# Patient Record
Sex: Male | Born: 1955 | Race: White | Hispanic: No | Marital: Married | Smoking: Former smoker
Health system: Southern US, Community
[De-identification: ages and names within clinical notes are randomized; demographics above are authoritative.]

## PROBLEM LIST (undated history)

## (undated) DIAGNOSIS — E785 Hyperlipidemia, unspecified: Secondary | ICD-10-CM

## (undated) DIAGNOSIS — G8929 Other chronic pain: Secondary | ICD-10-CM

## (undated) DIAGNOSIS — Z720 Tobacco use: Secondary | ICD-10-CM

## (undated) DIAGNOSIS — H9190 Unspecified hearing loss, unspecified ear: Secondary | ICD-10-CM

## (undated) DIAGNOSIS — R0600 Dyspnea, unspecified: Secondary | ICD-10-CM

## (undated) DIAGNOSIS — M549 Dorsalgia, unspecified: Secondary | ICD-10-CM

## (undated) DIAGNOSIS — K219 Gastro-esophageal reflux disease without esophagitis: Secondary | ICD-10-CM

## (undated) DIAGNOSIS — D472 Monoclonal gammopathy: Secondary | ICD-10-CM

## (undated) DIAGNOSIS — D649 Anemia, unspecified: Secondary | ICD-10-CM

## (undated) DIAGNOSIS — I10 Essential (primary) hypertension: Secondary | ICD-10-CM

## (undated) DIAGNOSIS — J449 Chronic obstructive pulmonary disease, unspecified: Secondary | ICD-10-CM

## (undated) DIAGNOSIS — C9000 Multiple myeloma not having achieved remission: Secondary | ICD-10-CM

## (undated) DIAGNOSIS — M1990 Unspecified osteoarthritis, unspecified site: Secondary | ICD-10-CM

## (undated) DIAGNOSIS — R809 Proteinuria, unspecified: Secondary | ICD-10-CM

## (undated) DIAGNOSIS — N189 Chronic kidney disease, unspecified: Secondary | ICD-10-CM

## (undated) HISTORY — DX: Proteinuria, unspecified: R80.9

## (undated) HISTORY — DX: Unspecified osteoarthritis, unspecified site: M19.90

## (undated) HISTORY — DX: Tobacco use: Z72.0

## (undated) HISTORY — DX: Other chronic pain: G89.29

## (undated) HISTORY — DX: Dorsalgia, unspecified: M54.9

## (undated) HISTORY — DX: Unspecified hearing loss, unspecified ear: H91.90

## (undated) HISTORY — DX: Hyperlipidemia, unspecified: E78.5

## (undated) HISTORY — DX: Chronic obstructive pulmonary disease, unspecified: J44.9

## (undated) HISTORY — DX: Essential (primary) hypertension: I10

## (undated) HISTORY — PX: TONSILLECTOMY: SUR1361

## (undated) HISTORY — DX: Monoclonal gammopathy: D47.2

## (undated) HISTORY — DX: Multiple myeloma not having achieved remission: C90.00

---

## 2009-01-06 DIAGNOSIS — C9000 Multiple myeloma not having achieved remission: Secondary | ICD-10-CM

## 2009-01-06 HISTORY — DX: Multiple myeloma not having achieved remission: C90.00

## 2010-07-03 DIAGNOSIS — L739 Follicular disorder, unspecified: Secondary | ICD-10-CM | POA: Insufficient documentation

## 2010-07-03 DIAGNOSIS — L309 Dermatitis, unspecified: Secondary | ICD-10-CM | POA: Insufficient documentation

## 2010-07-13 DIAGNOSIS — R809 Proteinuria, unspecified: Secondary | ICD-10-CM | POA: Insufficient documentation

## 2011-07-28 ENCOUNTER — Ambulatory Visit: Payer: Self-pay

## 2011-11-12 DIAGNOSIS — J329 Chronic sinusitis, unspecified: Secondary | ICD-10-CM | POA: Insufficient documentation

## 2011-11-12 DIAGNOSIS — J310 Chronic rhinitis: Secondary | ICD-10-CM | POA: Insufficient documentation

## 2011-11-25 ENCOUNTER — Encounter: Payer: Self-pay | Admitting: Internal Medicine

## 2011-12-07 ENCOUNTER — Encounter: Payer: Self-pay | Admitting: Internal Medicine

## 2012-01-28 ENCOUNTER — Encounter: Payer: Self-pay | Admitting: Internal Medicine

## 2012-02-07 ENCOUNTER — Encounter: Payer: Self-pay | Admitting: Internal Medicine

## 2012-05-24 DIAGNOSIS — H9110 Presbycusis, unspecified ear: Secondary | ICD-10-CM | POA: Insufficient documentation

## 2012-05-24 DIAGNOSIS — E785 Hyperlipidemia, unspecified: Secondary | ICD-10-CM | POA: Insufficient documentation

## 2012-05-24 DIAGNOSIS — C9000 Multiple myeloma not having achieved remission: Secondary | ICD-10-CM | POA: Insufficient documentation

## 2012-05-24 DIAGNOSIS — D1730 Benign lipomatous neoplasm of skin and subcutaneous tissue of unspecified sites: Secondary | ICD-10-CM | POA: Insufficient documentation

## 2012-05-24 DIAGNOSIS — E875 Hyperkalemia: Secondary | ICD-10-CM | POA: Insufficient documentation

## 2012-05-26 DIAGNOSIS — R229 Localized swelling, mass and lump, unspecified: Secondary | ICD-10-CM | POA: Insufficient documentation

## 2012-07-01 DIAGNOSIS — C9000 Multiple myeloma not having achieved remission: Secondary | ICD-10-CM | POA: Insufficient documentation

## 2012-12-17 DIAGNOSIS — N051 Unspecified nephritic syndrome with focal and segmental glomerular lesions: Secondary | ICD-10-CM | POA: Insufficient documentation

## 2013-08-18 IMAGING — CR DG LUMBAR SPINE 2-3V
1 series · 3 of 3 positions shown · non-contrast
Comparison: none

REASON FOR EXAM: back problems, COPD, multiple myeloma, fax report to [HOSPITAL]
QB [id number]
COMMENTS:

PROCEDURE:     DXR - DXR LUMBAR SPINE AP AND LATERAL  - July 28, 2011 [DATE]
RESULT:     Five non-rib bearing lumbar vertebral bodies are appreciated.
There is no evidence of fracture, dislocation or malalignment. Degenerative
disease changes are appreciated at the L5-S1 level.

[Series 1: ap · 0.17mm/px · 3 of 3 slices shown]
[im 1/3]
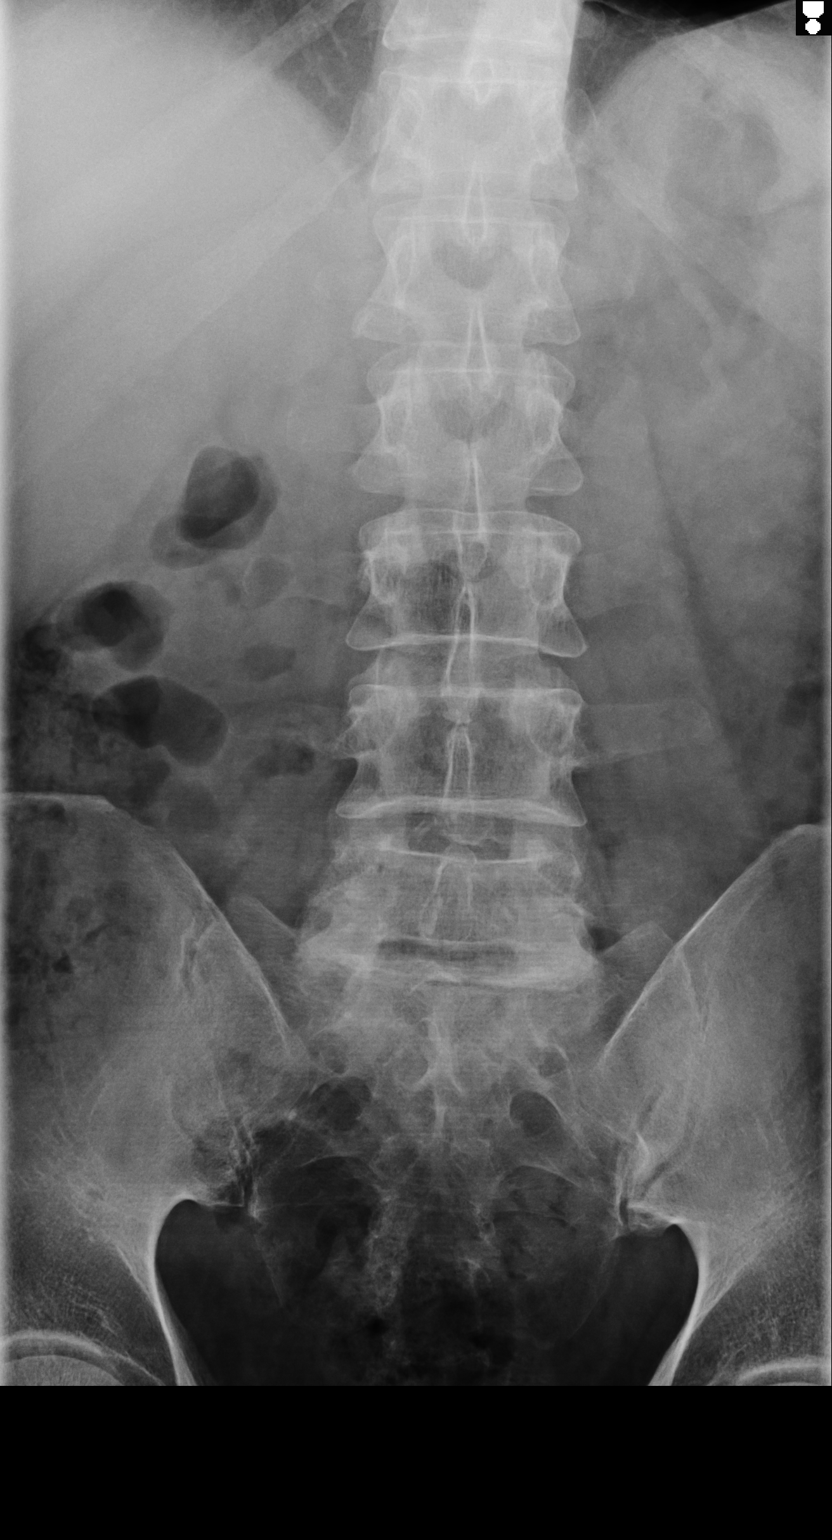
[im 2/3]
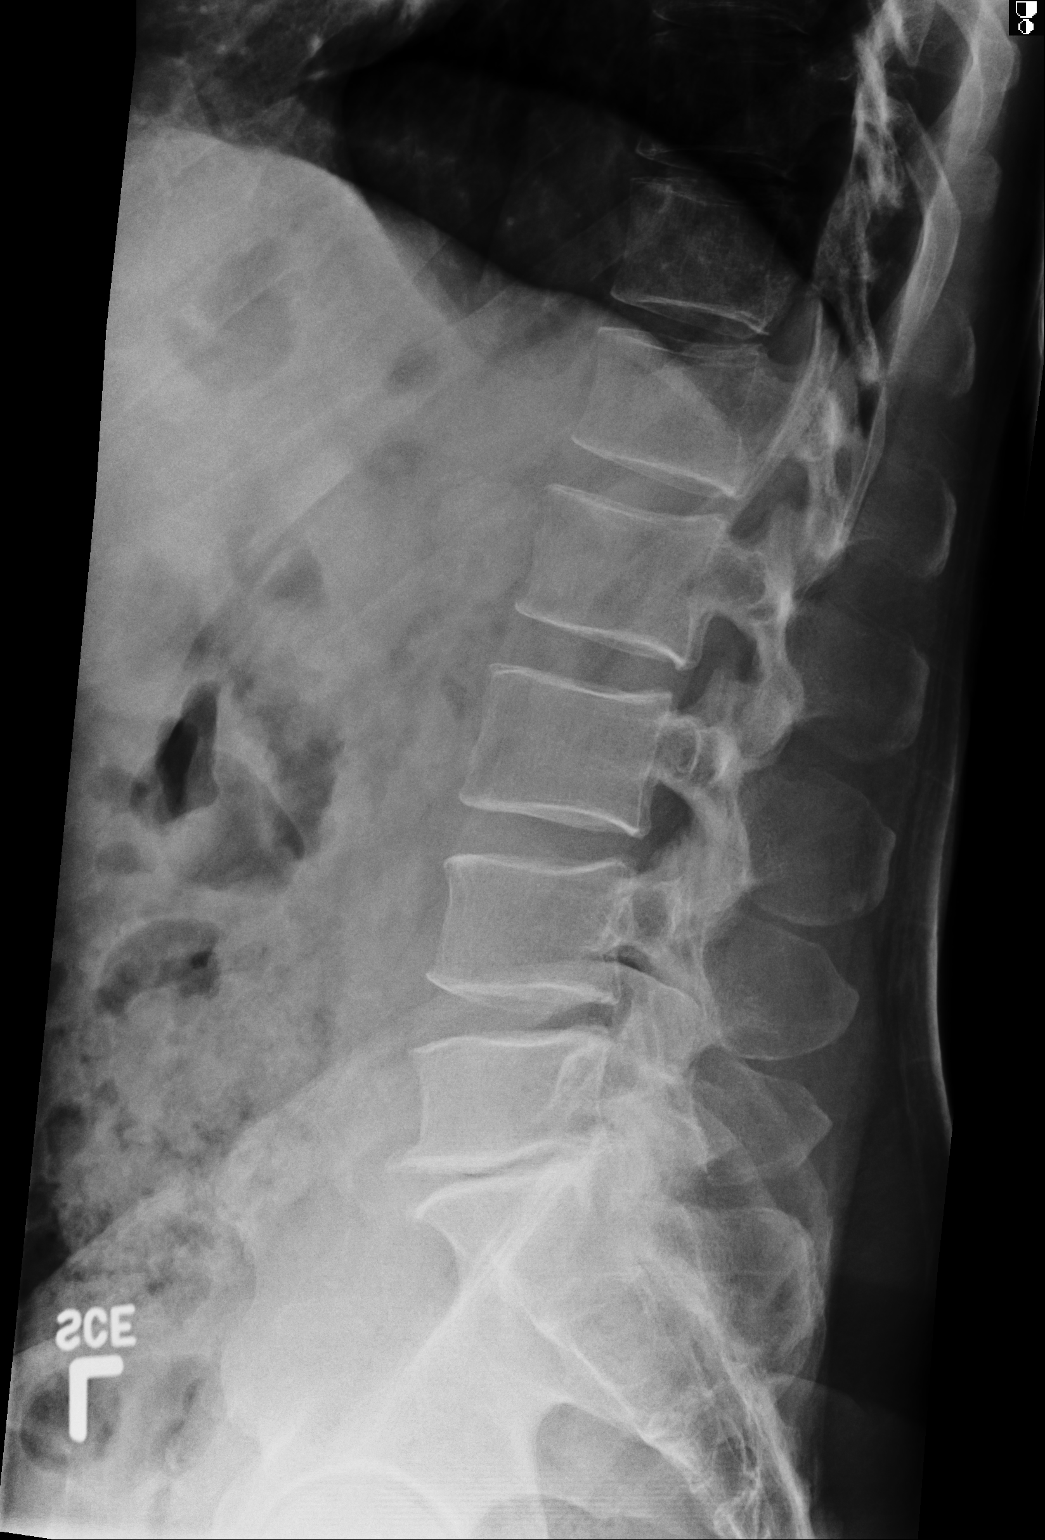
[im 3/3]
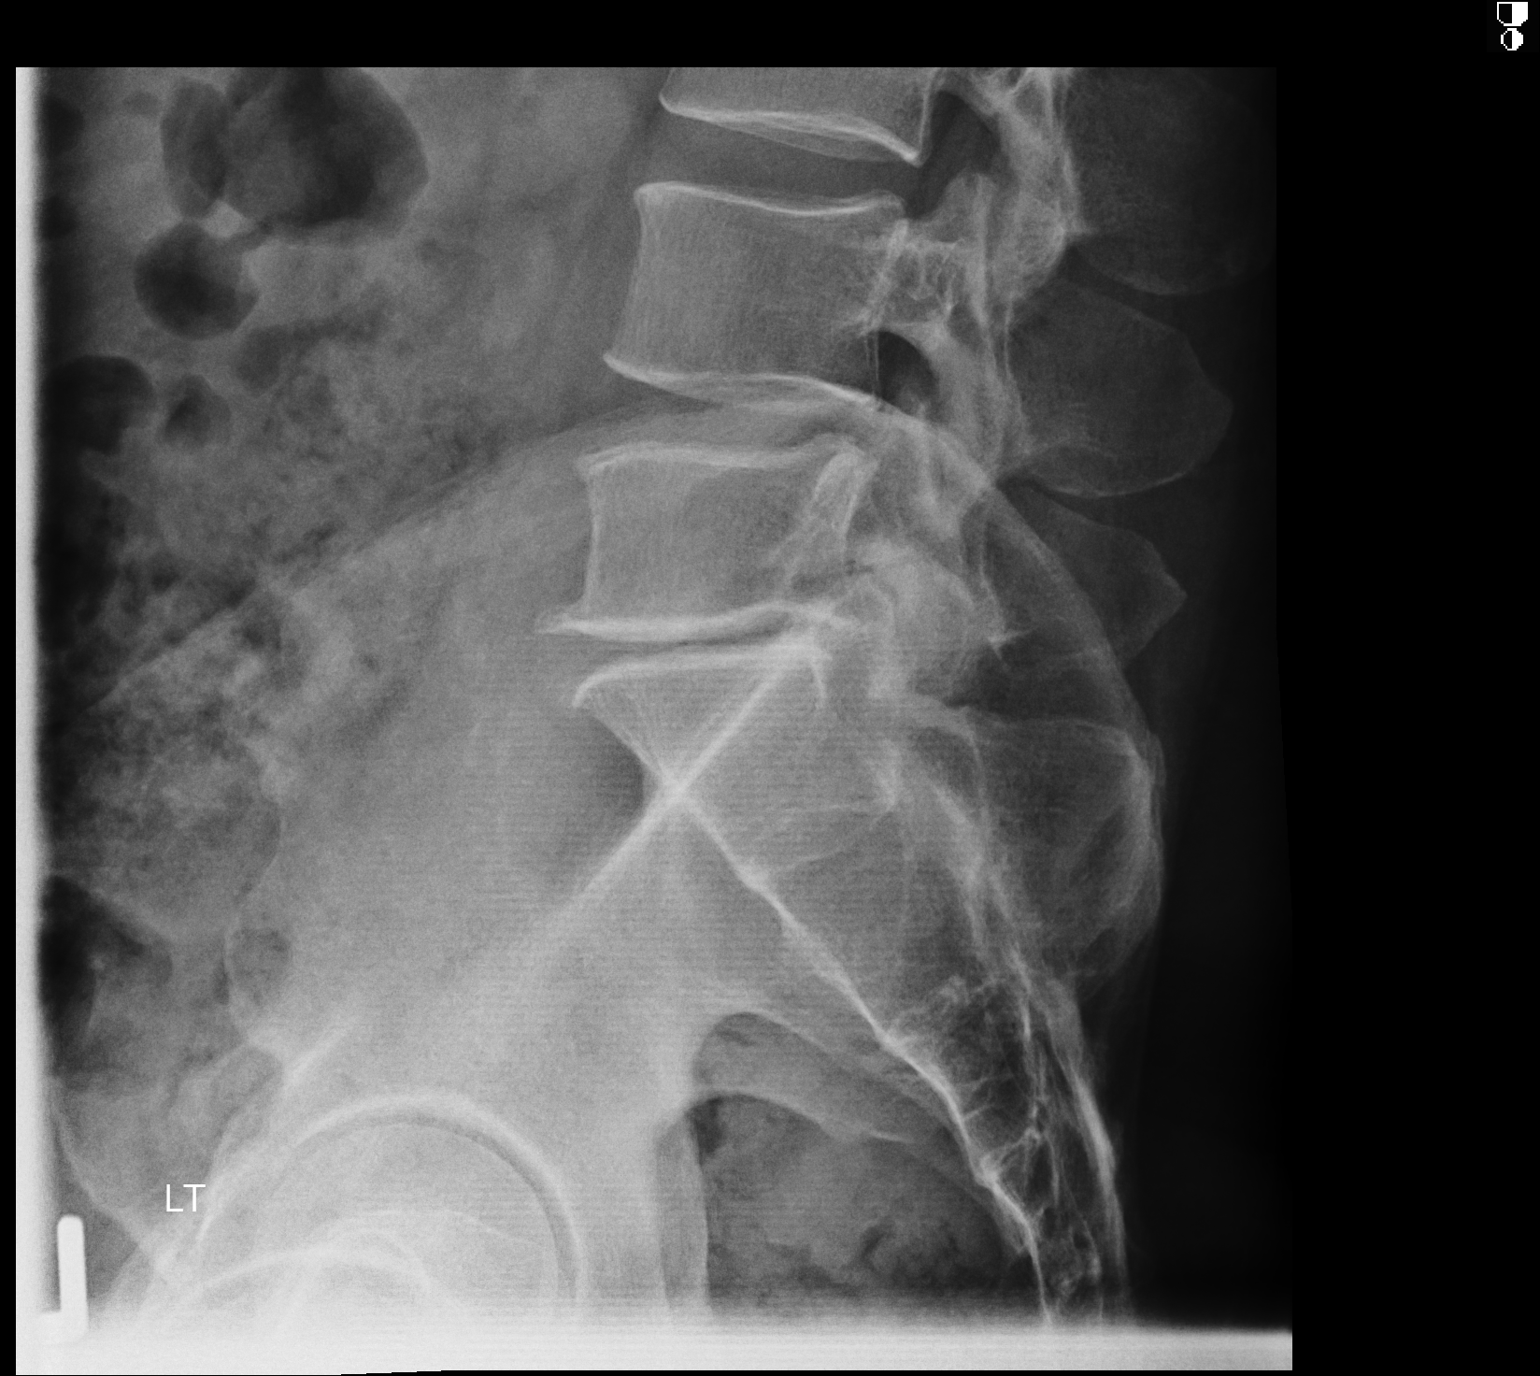

[3 of 3 positions shown; findings below may reference images not displayed]

IMPRESSION: 1. No acute osseous abnormalities.
2. If there is persistent clinical concern or persistent complaints of pain,
further evaluation with MRI is recommended.

## 2013-11-12 ENCOUNTER — Emergency Department: Payer: Self-pay | Admitting: Emergency Medicine

## 2014-01-02 DIAGNOSIS — Z720 Tobacco use: Secondary | ICD-10-CM | POA: Insufficient documentation

## 2014-01-02 DIAGNOSIS — N185 Chronic kidney disease, stage 5: Secondary | ICD-10-CM | POA: Insufficient documentation

## 2014-01-02 DIAGNOSIS — I10 Essential (primary) hypertension: Secondary | ICD-10-CM | POA: Insufficient documentation

## 2014-01-02 DIAGNOSIS — N182 Chronic kidney disease, stage 2 (mild): Secondary | ICD-10-CM

## 2014-01-02 DIAGNOSIS — R079 Chest pain, unspecified: Secondary | ICD-10-CM | POA: Insufficient documentation

## 2014-01-02 DIAGNOSIS — E782 Mixed hyperlipidemia: Secondary | ICD-10-CM | POA: Insufficient documentation

## 2014-01-02 DIAGNOSIS — R0681 Apnea, not elsewhere classified: Secondary | ICD-10-CM | POA: Insufficient documentation

## 2014-06-19 ENCOUNTER — Ambulatory Visit (INDEPENDENT_AMBULATORY_CARE_PROVIDER_SITE_OTHER): Payer: Medicaid Other | Admitting: Urgent Care

## 2014-06-19 ENCOUNTER — Encounter (INDEPENDENT_AMBULATORY_CARE_PROVIDER_SITE_OTHER): Payer: Self-pay

## 2014-06-19 ENCOUNTER — Encounter: Payer: Self-pay | Admitting: Urgent Care

## 2014-06-19 VITALS — BP 150/88 | HR 88 | Temp 97.9°F | Ht 72.0 in | Wt 235.2 lb

## 2014-06-19 DIAGNOSIS — R195 Other fecal abnormalities: Secondary | ICD-10-CM | POA: Diagnosis not present

## 2014-06-19 MED ORDER — NA SULFATE-K SULFATE-MG SULF 17.5-3.13-1.6 GM/177ML PO SOLN: 1.0000 | ORAL | Status: DC

## 2014-06-19 NOTE — Assessment & Plan Note (Signed)
.  Nathaniel [name] is a pleasant 59 y.o. male with positive iFOBT. Colonoscopy with Dr Allen Norris.  Differentials include colorectal carcinoma or polyp, inflammatory bowel disease versus benign anorectal source.  I have discussed risks & benefits which include, but are not limited to, bleeding, infection, perforation & drug reaction. The patient agrees with this plan & written consent will be obtained.

## 2014-06-19 NOTE — Progress Notes (Signed)
Gastroenterology Consultation  Referring Provider:     [facility] [place] Physician:  Ellamae Sia, MD Primary Gastroenterologist:  Dr. Allen Norris     Reason for Consultation:    iFOBT positive        HPI:   Nathaniel [name] is a 59 y.o. y/o male referred for consultation & management of iFOBT by Ellamae Sia, MD.  Pt was found to be iFOBT positive.  Never had a colonoscopy.  Denies heartburn, indigestion, nausea, vomiting, dysphagia, odynophagia or anorexia.  Denies constipation, diarrhea, rectal bleeding, melena or weight loss.  Denies NSAIDS except IBU less than once per month for back pain.   Past Medical History  Diagnosis Date  . COPD (chronic obstructive pulmonary disease)   . Hypertension   . Chronic back pain     Dr. Quay Burow manages (per pt)  . MGUS (monoclonal gammopathy of unknown significance)   . Proteinuria   . Hearing loss   . Hyperlipidemia   . Tobacco abuse     Past Surgical History  Procedure Laterality Date  . Tonsillectomy      as child    Prior to Admission medications   Medication Sig Start Date End Date Taking? Authorizing Provider  albuterol (PROVENTIL HFA;VENTOLIN HFA) 108 (90 BASE) MCG/ACT inhaler Inhale 2 puffs into the lungs every 6 (six) hours as needed for wheezing or shortness of breath.   Yes Historical Provider, MD  COMBIVENT RESPIMAT 20-100 MCG/ACT AERS respimat Inhale 1 puff into the lungs daily. 06/15/14  Yes Historical Provider, MD  enalapril (VASOTEC) 10 MG tablet Take 1 tablet by mouth at bedtime. 05/20/14  Yes Historical Provider, MD  oxyCODONE-acetaminophen (PERCOCET/ROXICET) 5-325 MG per tablet Take 1 tablet by mouth every 6 (six) hours as needed. 05/24/14  Yes Historical Provider, MD  sildenafil (VIAGRA) 100 MG tablet Take 1 tablet by mouth as needed.   Yes Historical Provider, MD    Family History  Problem Relation Age of Onset  . Heart disease Brother 108    MI  . Colon cancer Neg Hx   . Liver disease Neg Hx        History   Social History Narrative   Disabled, 2 sons-healthy   History  Substance Use Topics  . Smoking status: Current Some Day Smoker -- 0.50 packs/day  . Smokeless tobacco: Never Used  . Alcohol Use: No    Allergies as of 06/19/2014  . (No Known Allergies)    Review of Systems:    All systems reviewed and negative except where noted in HPI.   Physical Exam:  BP 150/88 mmHg  Pulse 88  Temp(Src) 97.9 F (36.6 C) (Oral)  Ht 6' (1.829 m)  Wt 235 lb 3.2 oz (106.686 kg)  BMI 31.89 kg/m2 No LMP for male patient. General:   Alert,  Well-developed, obese, pleasant and cooperative in NAD Head:  Normocephalic and atraumatic. Eyes:  Sclera clear, no icterus.   Conjunctiva pink. Ears:  Normal auditory acuity. Nose:  No deformity, discharge, or lesions. Mouth:  No deformity or lesions,oropharynx pink & moist. Neck:  Supple; no masses or thyromegaly. Lungs:  Respirations even and unlabored.  Clear throughout to auscultation.   No wheezes, crackles, or rhonchi. No acute distress. Heart:  Regular rate and rhythm; no murmurs, clicks, rubs, or gallops. Abdomen:  Obese.  Normal bowel sounds.  No bruits.  Soft, non-tender and non-distended without masses, hepatosplenomegaly or hernias noted.  No guarding or rebound tenderness.  Negative  Carnett sign.   Rectal:  Deferred.  Msk:  Symmetrical without gross deformities.  Good, equal movement & strength bilaterally. Pulses:  Normal pulses noted. Extremities:  No clubbing or edema.  No cyanosis. Neurologic:  Alert and oriented x3;  grossly normal neurologically. Skin:  Intact without significant lesions or rashes.  No jaundice. Lymph Nodes:  No significant cervical adenopathy. Psych:  Alert and cooperative. Normal mood and affect.

## 2014-06-19 NOTE — Patient Instructions (Signed)
Colonoscopy with Dr Allen Norris  One of your biggest health concerns is your smoking which increases your risk for most cancers and serious cardiovascular diseases such as strokes & heart attacks.  You should try your best to stop.  If you need assistance, please contact your PCP or Smoking Cessation Class at [facility] [phone]) or [facility] (1-800-QUIT-NOW).

## 2014-06-28 DIAGNOSIS — J449 Chronic obstructive pulmonary disease, unspecified: Secondary | ICD-10-CM | POA: Insufficient documentation

## 2014-06-30 ENCOUNTER — Telehealth: Payer: Self-pay

## 2014-06-30 NOTE — Telephone Encounter (Signed)
Spoke with patient's wife at this time and explained [facility] instructions for pre-registration and arrival time.

## 2014-07-04 ENCOUNTER — Encounter: Payer: Self-pay | Admitting: *Deleted

## 2014-07-04 ENCOUNTER — Ambulatory Visit: Payer: Medicaid Other | Admitting: Anesthesiology

## 2014-07-04 ENCOUNTER — Ambulatory Visit
Admission: RE | Admit: 2014-07-04 | Discharge: 2014-07-04 | Disposition: A | Discharge status: Home / Self Care | Payer: Medicaid Other | Source: Ambulatory Visit | Attending: Gastroenterology | Admitting: Gastroenterology

## 2014-07-04 ENCOUNTER — Encounter
Admission: RE | Disposition: A | Discharge status: Home / Self Care | Payer: Self-pay | Source: Ambulatory Visit | Attending: Gastroenterology

## 2014-07-04 DIAGNOSIS — Z1211 Encounter for screening for malignant neoplasm of colon: Secondary | ICD-10-CM | POA: Insufficient documentation

## 2014-07-04 DIAGNOSIS — D125 Benign neoplasm of sigmoid colon: Secondary | ICD-10-CM

## 2014-07-04 DIAGNOSIS — J449 Chronic obstructive pulmonary disease, unspecified: Secondary | ICD-10-CM | POA: Insufficient documentation

## 2014-07-04 DIAGNOSIS — M549 Dorsalgia, unspecified: Secondary | ICD-10-CM | POA: Diagnosis not present

## 2014-07-04 DIAGNOSIS — Z7951 Long term (current) use of inhaled steroids: Secondary | ICD-10-CM | POA: Insufficient documentation

## 2014-07-04 DIAGNOSIS — E785 Hyperlipidemia, unspecified: Secondary | ICD-10-CM | POA: Insufficient documentation

## 2014-07-04 DIAGNOSIS — D472 Monoclonal gammopathy: Secondary | ICD-10-CM | POA: Diagnosis not present

## 2014-07-04 DIAGNOSIS — G8929 Other chronic pain: Secondary | ICD-10-CM | POA: Diagnosis not present

## 2014-07-04 DIAGNOSIS — I10 Essential (primary) hypertension: Secondary | ICD-10-CM | POA: Diagnosis not present

## 2014-07-04 DIAGNOSIS — K621 Rectal polyp: Secondary | ICD-10-CM

## 2014-07-04 DIAGNOSIS — F17210 Nicotine dependence, cigarettes, uncomplicated: Secondary | ICD-10-CM | POA: Insufficient documentation

## 2014-07-04 DIAGNOSIS — D123 Benign neoplasm of transverse colon: Secondary | ICD-10-CM | POA: Diagnosis not present

## 2014-07-04 DIAGNOSIS — Z79899 Other long term (current) drug therapy: Secondary | ICD-10-CM | POA: Insufficient documentation

## 2014-07-04 HISTORY — PX: COLONOSCOPY WITH PROPOFOL: SHX5780

## 2014-07-04 SURGERY — COLONOSCOPY WITH PROPOFOL
Anesthesia: General

## 2014-07-04 MED ORDER — FENTANYL CITRATE (PF) 100 MCG/2ML IJ SOLN
INTRAMUSCULAR | Status: DC | PRN
  Administered 2014-07-04: 50 ug via INTRAVENOUS

## 2014-07-04 MED ORDER — MIDAZOLAM HCL 2 MG/2ML IJ SOLN
INTRAMUSCULAR | Status: DC | PRN
  Administered 2014-07-04: 1 mg via INTRAVENOUS

## 2014-07-04 MED ORDER — SODIUM CHLORIDE 0.9 % IV SOLN
INTRAVENOUS | Status: DC
  Administered 2014-07-04: 1000 mL via INTRAVENOUS

## 2014-07-04 MED ORDER — PROPOFOL 10 MG/ML IV BOLUS
INTRAVENOUS | Status: DC | PRN
  Administered 2014-07-04 (×4): 40 mg via INTRAVENOUS

## 2014-07-04 MED ORDER — PROPOFOL INFUSION 10 MG/ML OPTIME
INTRAVENOUS | Status: DC | PRN
  Administered 2014-07-04: 150 ug/kg/min via INTRAVENOUS

## 2014-07-04 MED ORDER — LACTATED RINGERS IV SOLN
INTRAVENOUS | Status: DC | PRN
  Administered 2014-07-04: 11:00:00 via INTRAVENOUS

## 2014-07-04 NOTE — Anesthesia Postprocedure Evaluation (Signed)
  Anesthesia Post-op Note  Patient: Nathaniel [name]  Procedure(s) Performed: Procedure(s): COLONOSCOPY WITH PROPOFOL (N/A)  Anesthesia type:General  Patient location: PACU  Post pain: Pain level controlled  Post assessment: Post-op Vital signs reviewed, Patient's Cardiovascular Status Stable, Respiratory Function Stable, Patent Airway and No signs of Nausea or vomiting  Post vital signs: Reviewed and stable  Last Vitals:  Filed Vitals:   07/04/14 1220  BP: 128/81  Pulse: 65  Temp:   Resp: 21    Level of consciousness: awake, alert  and patient cooperative  Complications: No apparent anesthesia complications

## 2014-07-04 NOTE — Anesthesia Preprocedure Evaluation (Addendum)
Anesthesia Evaluation  Patient identified by MRN, date of birth, ID band Patient awake    Reviewed: Allergy & Precautions, NPO status , Patient's Chart, lab work & pertinent test results  Airway Mallampati: II  TM Distance: >3 FB Neck ROM: Full    Dental  (+) Chipped   Pulmonary COPD COPD inhaler, Current Smoker,  breath sounds clear to auscultation  Pulmonary exam normal       Cardiovascular Normal cardiovascular exam Pt actually has lower BP.  On vasotec for his kidneys   Neuro/Psych negative neurological ROS  negative psych ROS   GI/Hepatic negative GI ROS, Neg liver ROS,   Endo/Other  negative endocrine ROS  Renal/GU Renal InsufficiencyRenal disease  negative genitourinary   Musculoskeletal negative musculoskeletal ROS (+)   Abdominal   Peds negative pediatric ROS (+)  Hematology negative hematology ROS (+)   Anesthesia Other Findings   Reproductive/Obstetrics                           Anesthesia Physical Anesthesia Plan  ASA: III  Anesthesia Plan: General   Post-op Pain Management:    Induction: Intravenous  Airway Management Planned: Nasal Cannula  Additional Equipment:   Intra-op Plan:   Post-operative Plan:   Informed Consent: I have reviewed the patients History and Physical, chart, labs and discussed the procedure including the risks, benefits and alternatives for the proposed anesthesia with the patient or authorized representative who has indicated his/her understanding and acceptance.   Dental advisory given  Plan Discussed with: CRNA and Surgeon  Anesthesia Plan Comments:         Anesthesia Quick Evaluation

## 2014-07-04 NOTE — Transfer of Care (Signed)
Immediate Anesthesia Transfer of Care Note  Patient: Nathaniel [name]  Procedure(s) Performed: Procedure(s): COLONOSCOPY WITH PROPOFOL (N/A)  Patient Location: PACU  Anesthesia Type:General  Level of Consciousness: awake, alert  and oriented  Airway & Oxygen Therapy: Patient Spontanous Breathing and Patient connected to nasal cannula oxygen  Post-op Assessment: Report given to RN and Post -op Vital signs reviewed and stable  Post vital signs: Reviewed and stable  Last Vitals:  Filed Vitals:   07/04/14 1024  BP: 123/83  Pulse: 78  Temp: 36.1 C  Resp: 20    Complications: No apparent anesthesia complications

## 2014-07-04 NOTE — H&P (Signed)
[facility]  [address] [place], [place] [address] Phone: [phone] Fax : [phone]  Primary Care Physician:  Ellamae Sia, MD Primary Gastroenterologist:  Dr. Allen Norris  Pre-Procedure History & Physical: HPI:  Nathaniel [name] is a 59 y.o. male is here for an colonoscopy.   Past Medical History  Diagnosis Date  . COPD (chronic obstructive pulmonary disease)   . Hypertension   . Chronic back pain     Dr. Quay Burow manages (per pt)  . MGUS (monoclonal gammopathy of unknown significance)   . Proteinuria   . Hearing loss   . Hyperlipidemia   . Tobacco abuse     Past Surgical History  Procedure Laterality Date  . Tonsillectomy      as child    Prior to Admission medications   Medication Sig Start Date End Date Taking? Authorizing Provider  COMBIVENT RESPIMAT 20-100 MCG/ACT AERS respimat Inhale 1 puff into the lungs daily. 06/15/14  Yes Historical Provider, MD  pravastatin (PRAVACHOL) 10 MG tablet Take 10 mg by mouth daily.   Yes Historical Provider, MD  albuterol (PROVENTIL HFA;VENTOLIN HFA) 108 (90 BASE) MCG/ACT inhaler Inhale 2 puffs into the lungs every 6 (six) hours as needed for wheezing or shortness of breath.    Historical Provider, MD  enalapril (VASOTEC) 10 MG tablet Take 1 tablet by mouth at bedtime. 05/20/14   Historical Provider, MD  Na Sulfate-K Sulfate-Mg Sulf (SUPREP BOWEL PREP) SOLN Take 1 kit by mouth as directed. 06/19/14   Andria Meuse, NP  oxyCODONE-acetaminophen (PERCOCET/ROXICET) 5-325 MG per tablet Take 1 tablet by mouth every 6 (six) hours as needed. 05/24/14   Historical Provider, MD  sildenafil (VIAGRA) 100 MG tablet Take 1 tablet by mouth as needed.    Historical Provider, MD    Allergies as of 06/19/2014  . (No Known Allergies)    Family History  Problem Relation Age of Onset  . Heart disease Brother 46    MI  . Colon cancer Neg Hx   . Liver disease Neg Hx     History   Social History  . Marital Status: Single    Spouse Name: N/A  . Number of Children: 2  . Years of Education: N/A   Occupational History  . Not on file.   Social History Main Topics  . Smoking status: Current Some Day Smoker -- 0.50 packs/day  . Smokeless tobacco: Never Used  . Alcohol Use: No  . Drug Use: No  . Sexual Activity: Not on file   Other Topics Concern  . Not on file   Social History Narrative   Disabled, 2 sons-healthy    Review of Systems: See HPI, otherwise negative ROS  Physical Exam: BP 123/83 mmHg  Pulse 78  Temp(Src) 97 F (36.1 C) (Tympanic)  Resp 20  Ht 6' (1.829 m)  Wt 230 lb (104.327 kg)  BMI 31.19 kg/m2  SpO2 99% General:   Alert,  pleasant and cooperative in NAD Head:  Normocephalic and atraumatic. Neck:  Supple; no masses or thyromegaly. Lungs:  Clear throughout to auscultation.    Heart:  Regular rate and rhythm. Abdomen:  Soft, nontender and nondistended. Normal bowel sounds, without guarding, and without rebound.   Neurologic:  Alert and  oriented x4;  grossly normal neurologically.  Impression/Plan: [name] is here for an colonoscopy to be performed for heme positive stools  Risks, benefits, limitations, and alternatives regarding  colonoscopy have been reviewed with the patient.  Questions  have been answered.  All parties agreeable.   Ollen Bowl, MD  07/04/2014, 11:07 AM

## 2014-07-04 NOTE — Op Note (Signed)
[facility] Gastroenterology Patient Name: Nathaniel [name] Procedure Date: 07/04/2014 11:15 AM MRN: [id number] Account #: [id number] Date of Birth: 1955/03/14 Admit Type: Outpatient Age: 59 Room: [facility] ENDO ROOM 4 Gender: Male Note Status: Finalized Procedure:         Colonoscopy Indications:       Screening for colorectal malignant neoplasm Providers:         Lucilla Lame, MD Referring MD:      Ellin Goodie, MD (Referring MD) Medicines:         Propofol per Anesthesia Complications:     No immediate complications. Procedure:         Pre-Anesthesia Assessment:                    - Prior to the procedure, a History and Physical was                     performed, and patient medications and allergies were                     reviewed. The patient's tolerance of previous anesthesia                     was also reviewed. The risks and benefits of the procedure                     and the sedation options and risks were discussed with the                     patient. All questions were answered, and informed consent                     was obtained. Prior Anticoagulants: The patient has taken                     no previous anticoagulant or antiplatelet agents. ASA                     Grade Assessment: II - A patient with mild systemic                     disease. After reviewing the risks and benefits, the                     patient was deemed in satisfactory condition to undergo                     the procedure.                    After obtaining informed consent, the colonoscope was                     passed under direct vision. Throughout the procedure, the                     patient's blood pressure, pulse, and oxygen saturations                     were monitored continuously. The Colonoscope was                     introduced through the anus and advanced to the the cecum,  identified by appendiceal orifice and ileocecal valve. The          colonoscopy was performed without difficulty. The patient                     tolerated the procedure well. The quality of the bowel                     preparation was excellent. Findings:      The perianal and digital rectal examinations were normal.      Two sessile polyps were found in the transverse colon. The polyps were 2       to 4 mm in size. These polyps were removed with a cold biopsy forceps.       Resection and retrieval were complete.      A 8 mm polyp was found in the sigmoid colon. The polyp was sessile. The       polyp was removed with a cold snare. Resection and retrieval were       complete.      A 4 mm polyp was found in the rectum. The polyp was sessile. The polyp       was removed with a cold biopsy forceps. Resection and retrieval were       complete. Impression:        - Two 2 to 4 mm polyps in the transverse colon. Resected                     and retrieved.                    - One 8 mm polyp in the sigmoid colon. Resected and                     retrieved.                    - One 4 mm polyp in the rectum. Resected and retrieved. Recommendation:    - Await pathology results.                    - Repeat colonoscopy in 5 years if polyp adenoma and 10                     years if hyperplastic Procedure Code(s): --- Professional ---                    [phone], Colonoscopy, flexible; with removal of tumor(s),                     polyp(s), or other lesion(s) by snare technique                    45380, 65, Colonoscopy, flexible; with biopsy, single or                     multiple Diagnosis Code(s): --- Professional ---                    Z12.11, Encounter for screening for malignant neoplasm of                     colon                    D12.3, Benign neoplasm of transverse colon  D12.5, Benign neoplasm of sigmoid colon                    K62.1, Rectal polyp CPT copyright 2014 [employer]. All rights reserved. The codes  documented in this report are preliminary and upon coder review may  be revised to meet current compliance requirements. Lucilla Lame, MD 07/04/2014 11:34:54 AM This report has been signed electronically. Number of Addenda: 0 Note Initiated On: 07/04/2014 11:15 AM Scope Withdrawal Time: 0 hours 10 minutes 29 seconds  Total Procedure Duration: 0 hours 14 minutes 45 seconds       [facility]

## 2014-07-05 ENCOUNTER — Encounter: Payer: Self-pay | Admitting: Gastroenterology

## 2014-07-05 LAB — SURGICAL PATHOLOGY

## 2014-07-11 ENCOUNTER — Encounter: Payer: Self-pay | Admitting: Gastroenterology

## 2014-10-17 ENCOUNTER — Encounter: Payer: Self-pay | Admitting: Respiratory Therapy

## 2014-10-17 ENCOUNTER — Encounter: Payer: Medicaid Other | Attending: Nurse Practitioner | Admitting: Respiratory Therapy

## 2014-10-17 VITALS — Ht 73.0 in | Wt 236.5 lb

## 2014-10-17 DIAGNOSIS — J449 Chronic obstructive pulmonary disease, unspecified: Secondary | ICD-10-CM | POA: Diagnosis not present

## 2014-10-19 NOTE — Progress Notes (Signed)
Cardiac Individual Treatment Plan  Patient Details  Name: Nathaniel [name] MRN: [id number] Date of Birth: 07-16-1955 Referring Provider:  Sharlyne Cai, NP  Initial Encounter Date: Date: 10/17/14  Visit Diagnosis: COPD, moderate ([place])  Patient's Home Medications on Admission:  Current outpatient prescriptions:  .  albuterol (PROVENTIL HFA;VENTOLIN HFA) 108 (90 BASE) MCG/ACT inhaler, Inhale into the lungs., Disp: , Rfl:  .  aspirin EC 81 MG tablet, Take 81 mg by mouth., Disp: , Rfl:  .  budesonide-formoterol (SYMBICORT) 160-4.5 MCG/ACT inhaler, Inhale into the lungs., Disp: , Rfl:  .  tiotropium (SPIRIVA HANDIHALER) 18 MCG inhalation capsule, Place into inhaler and inhale., Disp: , Rfl:  .  albuterol (PROVENTIL HFA;VENTOLIN HFA) 108 (90 BASE) MCG/ACT inhaler, Inhale 2 puffs into the lungs every 6 (six) hours as needed for wheezing or shortness of breath., Disp: , Rfl:  .  COMBIVENT RESPIMAT 20-100 MCG/ACT AERS respimat, Inhale 1 puff into the lungs daily., Disp: , Rfl: 11 .  enalapril (VASOTEC) 10 MG tablet, Take 1 tablet by mouth at bedtime., Disp: , Rfl: 11 .  Na Sulfate-K Sulfate-Mg Sulf (SUPREP BOWEL PREP) SOLN, Take 1 kit by mouth as directed., Disp: 1 Bottle, Rfl: 0 .  oxyCODONE-acetaminophen (PERCOCET/ROXICET) 5-325 MG per tablet, Take 1 tablet by mouth every 6 (six) hours as needed., Disp: , Rfl: 0 .  pravastatin (PRAVACHOL) 10 MG tablet, Take 10 mg by mouth daily., Disp: , Rfl:  .  sildenafil (VIAGRA) 100 MG tablet, Take 1 tablet by mouth as needed., Disp: , Rfl:   Past Medical History: Past Medical History  Diagnosis Date  . COPD (chronic obstructive pulmonary disease) ([place])   . Hypertension   . Chronic back pain     Dr. Quay Burow manages (per pt)  . MGUS (monoclonal gammopathy of unknown significance)   . Proteinuria   . Hearing loss   . Hyperlipidemia   . Tobacco abuse     Tobacco Use: History  Smoking status  . Current Some Day Smoker -- 2.00 packs/day for 45  years  . Types: Cigarettes  Smokeless tobacco  . Never Used    Labs: Recent Review Flowsheet Data    There is no flowsheet data to display.       Exercise Target Goals: Date: 10/17/14  Exercise Program Goal: Individual exercise prescription set with THRR, safety & activity barriers. Participant demonstrates ability to understand and report RPE using BORG scale, to self-measure pulse accurately, and to acknowledge the importance of the exercise prescription.  Exercise Prescription Goal: Starting with aerobic activity 30 plus minutes a day, 3 days per week for initial exercise prescription. Provide home exercise prescription and guidelines that participant acknowledges understanding prior to discharge.  Activity Barriers & Risk Stratification:     Activity Barriers & Risk Stratification - 10/17/14 1130    Activity Barriers & Risk Stratification   Activity Barriers Back Problems;Shortness of Breath;Deconditioning;Muscular Weakness   Risk Stratification Moderate      6 Minute Walk:     6 Minute Walk      10/19/14 1007       6 Minute Walk   Phase Initial     Distance 1340 feet     Walk Time 6 minutes     Resting HR 81 bpm     Resting BP 122/64 mmHg     Max Ex. HR 91 bpm     Max Ex. BP 142/62 mmHg     RPE 9     Perceived Dyspnea  2     Symptoms No        Initial Exercise Prescription:     Initial Exercise Prescription - 10/19/14 1000    Date of Initial Exercise Prescription   Date 10/17/14   Treadmill   MPH 2.5   Grade 0   Minutes 10   Recumbant Bike   Level 2   RPM 40   Watts 20   Minutes 10   NuStep   Level 3   Watts 50   Minutes 10   Arm Ergometer   Level 1   Watts 10   Minutes 10   Recumbant Elliptical   Level 1   RPM 40   Watts 20   Minutes 10   Elliptical   Level 1   Speed 3   Minutes 1   REL-XR   Level 3   Watts 50   Minutes 10   Prescription Details   Frequency (times per week) 3   Duration Progress to 30 minutes of  continuous aerobic without signs/symptoms of physical distress   Intensity   THRR REST +  30   Ratings of Perceived Exertion 11-15   Perceived Dyspnea 2-4   Progression Continue progressive overload as per policy without signs/symptoms or physical distress.   Resistance Training   Training Prescription Yes   Weight 2   Reps 10-15      Exercise Prescription Changes:   Discharge Exercise Prescription (Final Exercise Prescription Changes):   Nutrition:  Target Goals: Understanding of nutrition guidelines, daily intake of sodium <154m, cholesterol <2012m calories 30% from fat and 7% or less from saturated fats, daily to have 5 or more servings of fruits and vegetables.  Biometrics:     Pre Biometrics - 10/19/14 1012    Pre Biometrics   Height _0  (1.854 m)   Weight 236 lb 8 oz (107.276 kg)   Waist Circumference 43.5 inches   Hip Circumference 44 inches   Waist to Hip Ratio 0.99 %   BMI (Calculated) 31.3       Nutrition Therapy Plan and Nutrition Goals:     Nutrition Therapy & Goals - 10/17/14 1130    Nutrition Therapy   Diet Mr SmMedfordould like to meet with the dietitian. He eats out a lot and likes to eat late at night. He loves milk and has cut back on the amount he drinks at night.      Nutrition Discharge: Rate Your Plate Scores:   Nutrition Goals Re-Evaluation:   Psychosocial: Target Goals: Acknowledge presence or absence of depression, maximize coping skills, provide positive support system. Participant is able to verbalize types and ability to use techniques and skills needed for reducing stress and depression.  Initial Review & Psychosocial Screening:     Initial Psych Review & Screening - 10/17/14 1130    Family Dynamics   Good Support System? Yes   Comments Mr SmLedermanas good support from his 3 children and wife. He is managing his COPD well with pacing through activities. 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   Barriers   Psychosocial barriers to participate in program There are no identifiable barriers or psychosocial needs.;The patient should benefit from training in stress management and relaxation.   Screening Interventions   Interventions Encouraged to exercise      Quality of Life Scores:     Quality of Life - 10/17/14 1130  Quality of Life Scores   Health/Function Pre 17.23 %   Socioeconomic Pre 17 %   Psych/Spiritual Pre 22.55 %   Family Pre 22.8 %   GLOBAL Pre 18.98 %      PHQ-9:     Recent Review Flowsheet Data    Depression screen PHQ 2/9 10/17/2014   Decreased Interest 1   Down, Depressed, Hopeless 0   PHQ - 2 Score 1      Psychosocial Evaluation and Intervention:   Psychosocial Re-Evaluation:   Vocational Rehabilitation: Provide vocational rehab assistance to qualifying candidates.   Vocational Rehab Evaluation & Intervention:   Education: Education Goals: Education classes will be provided on a weekly basis, covering required topics. Participant will state understanding/return demonstration of topics presented.  Learning Barriers/Preferences:     Learning Barriers/Preferences - 10/17/14 1130    Learning Barriers/Preferences   Learning Barriers None   Learning Preferences Group Instruction;Individual Instruction;Pictoral;Skilled Demonstration;Verbal Instruction;Video;Written Material      Education Topics: General Nutrition Guidelines/Fats and Fiber: -Group instruction provided by verbal, written material, models and posters to present the general guidelines for heart healthy nutrition. Gives an explanation and review of dietary fats and fiber.   Controlling Sodium/Reading Food Labels: -Group verbal and written material supporting the discussion of sodium use in heart healthy nutrition. Review and explanation with models, verbal and written materials for utilization of the food label.   Exercise Physiology &  Risk Factors: - Group verbal and written instruction with models to review the exercise physiology of the cardiovascular system and associated critical values. Details cardiovascular disease risk factors and the goals associated with each risk factor.   Aerobic Exercise & Resistance Training: - Gives group verbal and written discussion on the health impact of inactivity. On the components of aerobic and resistive training programs and the benefits of this training and how to safely progress through these programs.   Flexibility, Balance, General Exercise Guidelines: - Provides group verbal and written instruction on the benefits of flexibility and balance training programs. Provides general exercise guidelines with specific guidelines to those with heart or lung disease. Demonstration and skill practice provided.   Stress Management: - Provides group verbal and written instruction about the health risks of elevated stress, cause of high stress, and healthy ways to reduce stress.   Depression: - Provides group verbal and written instruction on the correlation between heart/lung disease and depressed mood, treatment options, and the stigmas associated with seeking treatment.   Anatomy & Physiology of the Heart: - Group verbal and written instruction and models provide basic cardiac anatomy and physiology, with the coronary electrical and arterial systems. Review of: AMI, Angina, Valve disease, Heart Failure, Cardiac Arrhythmia, Pacemakers, and the ICD.   Cardiac Procedures: - Group verbal and written instruction and models to describe the testing methods done to diagnose heart disease. Reviews the outcomes of the test results. Describes the treatment choices: Medical Management, Angioplasty, or Coronary Bypass Surgery.   Cardiac Medications: - Group verbal and written instruction to review commonly prescribed medications for heart disease. Reviews the medication, class of the drug, and  side effects. Includes the steps to properly store meds and maintain the prescription regimen.   Go Sex-Intimacy & Heart Disease, Get SMART - Goal Setting: - Group verbal and written instruction through game format to discuss heart disease and the return to sexual intimacy. Provides group verbal and written material to discuss and apply goal setting through the application of the S.M.A.R.T. Method.   Other Matters  of the Heart: - Provides group verbal, written materials and models to describe Heart Failure, Angina, Valve Disease, and Diabetes in the realm of heart disease. Includes description of the disease process and treatment options available to the cardiac patient.   Exercise & Equipment Safety: - Individual verbal instruction and demonstration of equipment use and safety with use of the equipment.   Infection Prevention: - Provides verbal and written material to individual with discussion of infection control including proper hand washing and proper equipment cleaning during exercise session.   Falls Prevention: - Provides verbal and written material to individual with discussion of falls prevention and safety.          Pulmonary Rehab from 10/17/2014 in [place]   Date  10/17/14   Educator  LB   Instruction Review Code  2- meets goals/outcomes      Diabetes: - Individual verbal and written instruction to review signs/symptoms of diabetes, desired ranges of glucose level fasting, after meals and with exercise. Advice that pre and post exercise glucose checks will be done for 3 sessions at entry of program.    Knowledge Questionnaire Score:     Knowledge Questionnaire Score - 10/17/14 1130    Knowledge Questionnaire Score   Pre Score -5      Personal Goals and Risk Factors at Admission:     Personal Goals and Risk Factors at Admission - 10/17/14 1130    Personal Goals and Risk Factors on Admission    Weight Management Yes    Intervention Learn and follow the exercise and diet guidelines while in the program. Utilize the nutrition and education classes to help gain knowledge of the diet and exercise expectations in the program  Mr Hestand would like to meet with the dietitian. He eats out a lot and likes to eat late at night. He loves milk and has cut back on the amount he drinks at night.   Increase Aerobic Exercise and Physical Activity Yes   Intervention While in program, learn and follow the exercise prescription taught. Start at a low level workload and increase workload after able to maintain previous level for 30 minutes. Increase time before increasing intensity.  Mr Rappaport loves to ballroom dance and would like to continue the dancing. He has a Higher education careers adviser at [employer].   Understand more about Heart/Pulmonary Disease. Yes   Intervention While in program utilize professionals for any questions, and attend the education sessions. Great websites to use are www.americanheart.org or www.lung.org for reliable information.  Mr Waring is interested in learning more about COPD and  management of his daily life with COPD.   Develop more efficient breathing techniques such as purse lipped breathing and diaphragmatic breathing; and practicing self-pacing with activity Yes   Intervention While in program, learn and utilize the specific breathing techniques taught to you. Continue to practice and use the techniques as needed.  Mr Sterbenz demonstrated good technique with PLB.   Increase knowledge of respiratory medications and ability to use respiratory devices properly.  Yes   Intervention While in program, learn to administer MDI, nebulizer, and spacer properly.;Learn to take respiratory medicine as ordered.;While in program, learn to Clean MDI, nebulizers, and spacers properly.  Mr Doucet takes Symbicort, Proventil, and Spiriva.; educated him on the importance of using his spacer with the Proventil and Symbicort.   Hypertension  Yes   Goal Participant will see blood pressure controlled within the values of 140/2m/Hg or within value directed by  their physician.   Intervention Provide nutrition & aerobic exercise along with prescribed medications to achieve BP 140/90 or less.      Personal Goals and Risk Factors Review:    Personal Goals Discharge (Final Personal Goals and Risk Factors Review):     Comments:

## 2014-10-19 NOTE — Patient Instructions (Signed)
Patient Instructions  Patient Details  Name: Nathaniel [name] MRN: [id number] Date of Birth: 06/25/1955 Referring Provider:  Sharlyne Cai, NP  Below are the personal goals you chose as well as exercise and nutrition goals. Our goal is to help you keep on track towards obtaining and maintaining your goals. We will be discussing your progress on these goals with you throughout the program.  Initial Exercise Prescription:     Initial Exercise Prescription - 10/19/14 1000    Date of Initial Exercise Prescription   Date 10/17/14   Treadmill   MPH 2.5   Grade 0   Minutes 10   Recumbant Bike   Level 2   RPM 40   Watts 20   Minutes 10   NuStep   Level 3   Watts 50   Minutes 10   Arm Ergometer   Level 1   Watts 10   Minutes 10   Recumbant Elliptical   Level 1   RPM 40   Watts 20   Minutes 10   Elliptical   Level 1   Speed 3   Minutes 1   REL-XR   Level 3   Watts 50   Minutes 10   Prescription Details   Frequency (times per week) 3   Duration Progress to 30 minutes of continuous aerobic without signs/symptoms of physical distress   Intensity   THRR REST +  30   Ratings of Perceived Exertion 11-15   Perceived Dyspnea 2-4   Progression Continue progressive overload as per policy without signs/symptoms or physical distress.   Resistance Training   Training Prescription Yes   Weight 2   Reps 10-15      Exercise Goals: Frequency: Be able to perform aerobic exercise three times per week working toward 3-5 days per week.  Intensity: Work with a perceived exertion of 11 (fairly light) - 15 (hard) as tolerated. Follow your new exercise prescription and watch for changes in prescription as you progress with the program. Changes will be reviewed with you when they are made.  Duration: You should be able to do 30 minutes of continuous aerobic exercise in addition to a 5 minute warm-up and a 5 minute cool-down routine.  Nutrition Goals: Your personal nutrition goals  will be established when you do your nutrition analysis with the dietician.  The following are nutrition guidelines to follow: Cholesterol < 200mg /day Sodium < 1500mg /day Fiber: Men over 50 yrs - 30 grams per day  Personal Goals:     Personal Goals and Risk Factors at Admission - 10/17/14 1130    Personal Goals and Risk Factors on Admission    Weight Management Yes   Intervention Learn and follow the exercise and diet guidelines while in the program. Utilize the nutrition and education classes to help gain knowledge of the diet and exercise expectations in the program  Nathaniel [name] would like to meet with the dietitian. He eats out a lot and likes to eat late at night. He loves milk and has cut back on the amount he drinks at night.   Increase Aerobic Exercise and Physical Activity Yes   Intervention While in program, learn and follow the exercise prescription taught. Start at a low level workload and increase workload after able to maintain previous level for 30 minutes. Increase time before increasing intensity.  Nathaniel [name] loves to ballroom dance and would like to continue the dancing. He has a Higher education careers adviser at [employer].   Understand more about  Heart/Pulmonary Disease. Yes   Intervention While in program utilize professionals for any questions, and attend the education sessions. Great websites to use are www.americanheart.org or www.lung.org for reliable information.  Nathaniel [name] is interested in learning more about COPD and  management of his daily life with COPD.   Develop more efficient breathing techniques such as purse lipped breathing and diaphragmatic breathing; and practicing self-pacing with activity Yes   Intervention While in program, learn and utilize the specific breathing techniques taught to you. Continue to practice and use the techniques as needed.  Nathaniel [name] demonstrated good technique with PLB.   Increase knowledge of respiratory medications and ability to use respiratory devices  properly.  Yes   Intervention While in program, learn to administer MDI, nebulizer, and spacer properly.;Learn to take respiratory medicine as ordered.;While in program, learn to Clean MDI, nebulizers, and spacers properly.  Nathaniel [name] takes Symbicort, Proventil, and Spiriva.; educated him on the importance of using his spacer with the Proventil and Symbicort.   Hypertension Yes   Goal Participant will see blood pressure controlled within the values of 140/77mm/Hg or within value directed by their physician.   Intervention Provide nutrition & aerobic exercise along with prescribed medications to achieve BP 140/90 or less.      Tobacco Use Initial Evaluation: History  Smoking status  . Current Some Day Smoker -- 2.00 packs/day for 45 years  . Types: Cigarettes  Smokeless tobacco  . Never Used    Copy of goals given to participant.

## 2014-10-20 ENCOUNTER — Encounter: Payer: Medicaid Other | Admitting: *Deleted

## 2014-10-20 DIAGNOSIS — J449 Chronic obstructive pulmonary disease, unspecified: Secondary | ICD-10-CM | POA: Diagnosis not present

## 2014-10-20 NOTE — Progress Notes (Signed)
Daily Session Note  Patient Details  Name: Nathaniel [name] MRN: [id number] Date of Birth: 1955/10/13 Referring Provider:  Sharlyne Cai, NP  Encounter Date: 10/20/2014  Check In:     Session Check In - 10/20/14 1102    Check-In   Staff Present Frederich Cha RRT, RCP Respiratory Therapist;Elzora Cullins Dillard Essex MS, ACSM CEP Exercise Physiologist;Other   ER physicians immediately available to respond to emergencies [facility] immediately available ER MD   Physician(s) Jacqualine Code and Paduchowski   Medication changes reported     No   Fall or balance concerns reported    No   Warm-up and Cool-down Performed on first and last piece of equipment   VAD Patient? No   Pain Assessment   Currently in Pain? No/denies   Multiple Pain Sites No           Exercise Prescription Changes - 10/20/14 1100    Response to Exercise   Blood Pressure (Admit) 122/70 mmHg   Blood Pressure (Exercise) 142/90 mmHg   Blood Pressure (Exit) 116/78 mmHg   Heart Rate (Admit) 78 bpm   Heart Rate (Exercise) 114 bpm   Heart Rate (Exit) 88 bpm   Oxygen Saturation (Admit) 97 %   Oxygen Saturation (Exercise) 96 %   Oxygen Saturation (Exit) 97 %   Rating of Perceived Exertion (Exercise) 13   Perceived Dyspnea (Exercise) 3   Symptoms None   Comments First day of exercise! Patient was oriented to the gym and the equipment functions and settings. Procedures and policies of the gym were outlined and explained. The patient's individual exercise prescription and treatment plan were reviewed with them. All starting workloads were established based on the results of the functional testing  done at the initial intake visit. The plan for exercise progression was also introduced and progression will be customized based on the patient's performance and goals.    Duration Progress to 30 minutes of continuous aerobic without signs/symptoms of physical distress   Intensity Rest + 30   Progression Continue progressive overload as per  policy without signs/symptoms or physical distress.   Resistance Training   Training Prescription Yes   Weight 2   Reps 10-12   Interval Training   Interval Training No   Treadmill   MPH 2.6   Grade 0   Minutes 10   Recumbant Bike   Level 3.5   RPM 50   Minutes 15   NuStep   Level 3   Watts 50   Minutes 20      Goals Met:  Proper associated with RPD/PD & O2 Sat Independence with exercise equipment Exercise tolerated well Personal goals reviewed Queuing for purse lip breathing Strength training completed today  Goals Unmet:  Not Applicable  Goals Comments: First day of exercise.    Dr. Emily Filbert is Medical Director for [facility] and [facility].

## 2014-10-20 NOTE — Progress Notes (Signed)
Pulmonary Individual Treatment Plan  Patient Details  Name: Nathaniel [name] MRN: [id number] Date of Birth: October 03, 1955 Referring Provider:  Sharlyne Cai, NP  Initial Encounter Date:  10/17/2014  Visit Diagnosis: COPD, moderate ([place])  Patient's Home Medications on Admission:  Current outpatient prescriptions:  .  albuterol (PROVENTIL HFA;VENTOLIN HFA) 108 (90 BASE) MCG/ACT inhaler, Inhale 2 puffs into the lungs every 6 (six) hours as needed for wheezing or shortness of breath., Disp: , Rfl:  .  albuterol (PROVENTIL HFA;VENTOLIN HFA) 108 (90 BASE) MCG/ACT inhaler, Inhale into the lungs., Disp: , Rfl:  .  aspirin EC 81 MG tablet, Take 81 mg by mouth., Disp: , Rfl:  .  budesonide-formoterol (SYMBICORT) 160-4.5 MCG/ACT inhaler, Inhale into the lungs., Disp: , Rfl:  .  COMBIVENT RESPIMAT 20-100 MCG/ACT AERS respimat, Inhale 1 puff into the lungs daily., Disp: , Rfl: 11 .  enalapril (VASOTEC) 10 MG tablet, Take 1 tablet by mouth at bedtime., Disp: , Rfl: 11 .  Na Sulfate-K Sulfate-Mg Sulf (SUPREP BOWEL PREP) SOLN, Take 1 kit by mouth as directed., Disp: 1 Bottle, Rfl: 0 .  oxyCODONE-acetaminophen (PERCOCET/ROXICET) 5-325 MG per tablet, Take 1 tablet by mouth every 6 (six) hours as needed., Disp: , Rfl: 0 .  pravastatin (PRAVACHOL) 10 MG tablet, Take 10 mg by mouth daily., Disp: , Rfl:  .  sildenafil (VIAGRA) 100 MG tablet, Take 1 tablet by mouth as needed., Disp: , Rfl:  .  tiotropium (SPIRIVA HANDIHALER) 18 MCG inhalation capsule, Place into inhaler and inhale., Disp: , Rfl:   Past Medical History: Past Medical History  Diagnosis Date  . COPD (chronic obstructive pulmonary disease) ([place])   . Hypertension   . Chronic back pain     Dr. Quay Burow manages (per pt)  . MGUS (monoclonal gammopathy of unknown significance)   . Proteinuria   . Hearing loss   . Hyperlipidemia   . Tobacco abuse     Tobacco Use: History  Smoking status  . Current Some Day Smoker -- 2.00 packs/day for 45  years  . Types: Cigarettes  Smokeless tobacco  . Never Used    Labs: Recent Review Flowsheet Data    There is no flowsheet data to display.       ADL [facility]:     ADL [facility]      10/17/14 1130       ADL [facility]   ADL Phase Entry     SOB Score total 48     Rest 1     Walk 3     Stairs 4     Bath 2     Dress 0     Shop 0         Pulmonary Function Assessment:     Pulmonary Function Assessment - 10/17/14 1130    Pulmonary Function Tests   RV% 270 %   DLCO% 67 %   Initial Spirometry Results   FVC% 45.9 %   FEV1% 23 %   FEV1/FVC Ratio 40   Post Bronchodilator Spirometry Results   FVC% 45.1 %   FEV1% 34 %   FEV1/FVC Ratio 40      Exercise Target Goals:    Exercise Program Goal: Individual exercise prescription set with THRR, safety & activity barriers. Participant demonstrates ability to understand and report RPE using BORG scale, to self-measure pulse accurately, and to acknowledge the importance of the exercise prescription.  Exercise Prescription Goal: Starting with aerobic activity 30 plus minutes a day, 3 days  per week for initial exercise prescription. Provide home exercise prescription and guidelines that participant acknowledges understanding prior to discharge.  Activity Barriers & Risk Stratification:     Activity Barriers & Risk Stratification - 10/17/14 1130    Activity Barriers & Risk Stratification   Activity Barriers Back Problems;Shortness of Breath;Deconditioning;Muscular Weakness   Risk Stratification Moderate      6 Minute Walk:     6 Minute Walk      10/19/14 1007       6 Minute Walk   Phase Initial     Distance 1340 feet     Walk Time 6 minutes     Resting HR 81 bpm     Resting BP 122/64 mmHg     Max Ex. HR 91 bpm     Max Ex. BP 142/62 mmHg     RPE 9     Perceived Dyspnea  2     Symptoms No        Initial Exercise Prescription:     Initial Exercise Prescription - 10/19/14 1000    Date of Initial Exercise  Prescription   Date 10/17/14   Treadmill   MPH 2.5   Grade 0   Minutes 10   Recumbant Bike   Level 2   RPM 40   Watts 20   Minutes 10   NuStep   Level 3   Watts 50   Minutes 10   Arm Ergometer   Level 1   Watts 10   Minutes 10   Recumbant Elliptical   Level 1   RPM 40   Watts 20   Minutes 10   Elliptical   Level 1   Speed 3   Minutes 1   REL-XR   Level 3   Watts 50   Minutes 10   Prescription Details   Frequency (times per week) 3   Duration Progress to 30 minutes of continuous aerobic without signs/symptoms of physical distress   Intensity   THRR REST +  30   Ratings of Perceived Exertion 11-15   Perceived Dyspnea 2-4   Progression Continue progressive overload as per policy without signs/symptoms or physical distress.   Resistance Training   Training Prescription Yes   Weight 2   Reps 10-15      Exercise Prescription Changes:     Exercise Prescription Changes      10/20/14 1100           Response to Exercise   Blood Pressure (Admit) 122/70 mmHg       Blood Pressure (Exercise) 142/90 mmHg       Blood Pressure (Exit) 116/78 mmHg       Heart Rate (Admit) 78 bpm       Heart Rate (Exercise) 114 bpm       Heart Rate (Exit) 88 bpm       Oxygen Saturation (Admit) 97 %       Oxygen Saturation (Exercise) 96 %       Oxygen Saturation (Exit) 97 %       Rating of Perceived Exertion (Exercise) 13       Perceived Dyspnea (Exercise) 3       Symptoms None       Comments First day of exercise! Patient was oriented to the gym and the equipment functions and settings. Procedures and policies of the gym were outlined and explained. The patient's individual exercise prescription and treatment plan were reviewed with them. All starting workloads were  established based on the results of the functional testing  done at the initial intake visit. The plan for exercise progression was also introduced and progression will be customized based on the patient's performance and  goals.        Duration Progress to 30 minutes of continuous aerobic without signs/symptoms of physical distress       Intensity Rest + 30       Progression Continue progressive overload as per policy without signs/symptoms or physical distress.       Resistance Training   Training Prescription Yes       Weight 2       Reps 10-12       Interval Training   Interval Training No       Treadmill   MPH 2.6       Grade 0       Minutes 10       Recumbant Bike   Level 3.5       RPM 50       Minutes 15       NuStep   Level 3       Watts 50       Minutes 20          Discharge Exercise Prescription (Final Exercise Prescription Changes):     Exercise Prescription Changes - 10/20/14 1100    Response to Exercise   Blood Pressure (Admit) 122/70 mmHg   Blood Pressure (Exercise) 142/90 mmHg   Blood Pressure (Exit) 116/78 mmHg   Heart Rate (Admit) 78 bpm   Heart Rate (Exercise) 114 bpm   Heart Rate (Exit) 88 bpm   Oxygen Saturation (Admit) 97 %   Oxygen Saturation (Exercise) 96 %   Oxygen Saturation (Exit) 97 %   Rating of Perceived Exertion (Exercise) 13   Perceived Dyspnea (Exercise) 3   Symptoms None   Comments First day of exercise! Patient was oriented to the gym and the equipment functions and settings. Procedures and policies of the gym were outlined and explained. The patient's individual exercise prescription and treatment plan were reviewed with them. All starting workloads were established based on the results of the functional testing  done at the initial intake visit. The plan for exercise progression was also introduced and progression will be customized based on the patient's performance and goals.    Duration Progress to 30 minutes of continuous aerobic without signs/symptoms of physical distress   Intensity Rest + 30   Progression Continue progressive overload as per policy without signs/symptoms or physical distress.   Resistance Training   Training Prescription Yes    Weight 2   Reps 10-12   Interval Training   Interval Training No   Treadmill   MPH 2.6   Grade 0   Minutes 10   Recumbant Bike   Level 3.5   RPM 50   Minutes 15   NuStep   Level 3   Watts 50   Minutes 20       Nutrition:  Target Goals: Understanding of nutrition guidelines, daily intake of sodium <1567m, cholesterol <2031m calories 30% from fat and 7% or less from saturated fats, daily to have 5 or more servings of fruits and vegetables.  Biometrics:     Pre Biometrics - 10/19/14 1012    Pre Biometrics   Height 6' 1"  (1.854 m)   Weight 236 lb 8 oz (107.276 kg)   Waist Circumference 43.5 inches   Hip Circumference 44 inches  Waist to Hip Ratio 0.99 %   BMI (Calculated) 31.3       Nutrition Therapy Plan and Nutrition Goals:     Nutrition Therapy & Goals - 10/17/14 1130    Nutrition Therapy   Diet Mr Harmon would like to meet with the dietitian. He eats out a lot and likes to eat late at night. He loves milk and has cut back on the amount he drinks at night.      Nutrition Discharge: Rate Your Plate Scores:   Psychosocial: Target Goals: Acknowledge presence or absence of depression, maximize coping skills, provide positive support system. Participant is able to verbalize types and ability to use techniques and skills needed for reducing stress and depression.  Initial Review & Psychosocial Screening:     Initial Psych Review & Screening - 10/17/14 1130    Family Dynamics   Good Support System? Yes   Comments Mr Rinn has good support from his 3 children and wife. He is managing his COPD well with pacing through activities. 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   Barriers   Psychosocial barriers to participate in program There are no identifiable barriers or psychosocial needs.;The patient should benefit from training in stress management and relaxation.   Screening Interventions    Interventions Encouraged to exercise      Quality of Life Scores:     Quality of Life - 10/17/14 1130    Quality of Life Scores   Health/Function Pre 17.23 %   Socioeconomic Pre 17 %   Psych/Spiritual Pre 22.55 %   Family Pre 22.8 %   GLOBAL Pre 18.98 %      PHQ-9:     Recent Review Flowsheet Data    Depression screen PHQ 2/9 10/17/2014   Decreased Interest 1   Down, Depressed, Hopeless 0   PHQ - 2 Score 1      Psychosocial Evaluation and Intervention:   Psychosocial Re-Evaluation:  Education: Education Goals: Education classes will be provided on a weekly basis, covering required topics. Participant will state understanding/return demonstration of topics presented.  Learning Barriers/Preferences:     Learning Barriers/Preferences - 10/17/14 1130    Learning Barriers/Preferences   Learning Barriers None   Learning Preferences Group Instruction;Individual Instruction;Pictoral;Skilled Demonstration;Verbal Instruction;Video;Written Material      Education Topics: Initial Evaluation Education: - Verbal, written and demonstration of respiratory meds, RPE/PD scales, oximetry and breathing techniques. Instruction on use of nebulizers and MDIs: cleaning and proper use, rinsing mouth with steroid doses and importance of monitoring MDI activations.          Pulmonary Rehab from 10/20/2014 in [place]   Date  10/17/14   Educator  LB    Instruction Review Code  2- meets goals/outcomes      General Nutrition Guidelines/Fats and Fiber: -Group instruction provided by verbal, written material, models and posters to present the general guidelines for heart healthy nutrition. Gives an explanation and review of dietary fats and fiber.   Controlling Sodium/Reading Food Labels: -Group verbal and written material supporting the discussion of sodium use in heart healthy nutrition. Review and explanation with models, verbal and written  materials for utilization of the food label.   Exercise Physiology & Risk Factors: - Group verbal and written instruction with models to review the exercise physiology of the cardiovascular system and associated critical values. Details cardiovascular disease risk factors and the goals associated  with each risk factor.   Aerobic Exercise & Resistance Training: - Gives group verbal and written discussion on the health impact of inactivity. On the components of aerobic and resistive training programs and the benefits of this training and how to safely progress through these programs.   Flexibility, Balance, General Exercise Guidelines: - Provides group verbal and written instruction on the benefits of flexibility and balance training programs. Provides general exercise guidelines with specific guidelines to those with heart or lung disease. Demonstration and skill practice provided.   Stress Management: - Provides group verbal and written instruction about the health risks of elevated stress, cause of high stress, and healthy ways to reduce stress.   Depression: - Provides group verbal and written instruction on the correlation between heart/lung disease and depressed mood, treatment options, and the stigmas associated with seeking treatment.   Exercise & Equipment Safety: - Individual verbal instruction and demonstration of equipment use and safety with use of the equipment.      Pulmonary Rehab from 10/20/2014 in [place]   Date  10/20/14   Educator  [place]   Instruction Review Code  2- meets goals/outcomes      Infection Prevention: - Provides verbal and written material to individual with discussion of infection control including proper hand washing and proper equipment cleaning during exercise session.      Pulmonary Rehab from 10/20/2014 in [place]   Date  10/20/14   Educator  [place]   Instruction Review  Code  2- meets goals/outcomes      Falls Prevention: - Provides verbal and written material to individual with discussion of falls prevention and safety.      Pulmonary Rehab from 10/20/2014 in [place]   Date  10/17/14   Educator  LB   Instruction Review Code  2- meets goals/outcomes      Diabetes: - Individual verbal and written instruction to review signs/symptoms of diabetes, desired ranges of glucose level fasting, after meals and with exercise. Advice that pre and post exercise glucose checks will be done for 3 sessions at entry of program.   Chronic Lung Diseases: - Group verbal and written instruction to review new updates, new respiratory medications, new advancements in procedures and treatments. Provide informative websites and "800" numbers of self-education.   Lung Procedures: - Group verbal and written instruction to describe testing methods done to diagnose lung disease. Review the outcome of test results. Describe the treatment choices: Pulmonary Function Tests, ABGs and oximetry.   Energy Conservation: - Provide group verbal and written instruction for methods to conserve energy, plan and organize activities. Instruct on pacing techniques, use of adaptive equipment and posture/positioning to relieve shortness of breath.   Triggers: - Group verbal and written instruction to review types of environmental controls: home humidity, furnaces, filters, dust mite/pet prevention, HEPA vacuums. To discuss weather changes, air quality and the benefits of nasal washing.   Exacerbations: - Group verbal and written instruction to provide: warning signs, infection symptoms, calling MD promptly, preventive modes, and value of vaccinations. Review: effective airway clearance, coughing and/or vibration techniques. Create an Sports administrator.   Oxygen: - Individual and group verbal and written instruction on oxygen therapy. Includes supplement  oxygen, available portable oxygen systems, continuous and intermittent flow rates, oxygen safety, concentrators, and Medicare reimbursement for oxygen.   Respiratory Medications: - Group verbal and written instruction to review medications for lung disease. Drug class, frequency,  complications, importance of spacers, rinsing mouth after steroid MDI's, and proper cleaning methods for nebulizers.      Pulmonary Rehab from 10/20/2014 in [place]   Date  10/17/14   Educator  LB   Instruction Review Code  2- meets goals/outcomes      AED/CPR: - Group verbal and written instruction with the use of models to demonstrate the basic use of the AED with the basic ABC's of resuscitation.   Breathing Retraining: - Provides individuals verbal and written instruction on purpose, frequency, and proper technique of diaphragmatic breathing and pursed-lipped breathing. Applies individual practice skills.      Pulmonary Rehab from 10/20/2014 in [place]   Date  10/20/14   Educator  [place]   Instruction Review Code  2- meets goals/outcomes      Anatomy and Physiology of the Lungs: - Group verbal and written instruction with the use of models to provide basic lung anatomy and physiology related to function, structure and complications of lung disease.   Heart Failure: - Group verbal and written instruction on the basics of heart failure: signs/symptoms, treatments, explanation of ejection fraction, enlarged heart and cardiomyopathy.      Pulmonary Rehab from 10/20/2014 in [place]   Date  10/20/14   Educator  CE   Instruction Review Code  2- meets goals/outcomes      Sleep Apnea: - Individual verbal and written instruction to review Obstructive Sleep Apnea. Review of risk factors, methods for diagnosing and types of masks and machines for OSA.   Anxiety: - Provides group, verbal  and written instruction on the correlation between heart/lung disease and anxiety, treatment options, and management of anxiety.   Relaxation: - Provides group, verbal and written instruction about the benefits of relaxation for patients with heart/lung disease. Also provides patients with examples of relaxation techniques.   Knowledge Questionnaire Score:     Knowledge Questionnaire Score - 10/17/14 1130    Knowledge Questionnaire Score   Pre Score -5      Personal Goals and Risk Factors at Admission:     Personal Goals and Risk Factors at Admission - 10/17/14 1130    Personal Goals and Risk Factors on Admission    Weight Management Yes   Intervention Learn and follow the exercise and diet guidelines while in the program. Utilize the nutrition and education classes to help gain knowledge of the diet and exercise expectations in the program  Mr Shough would like to meet with the dietitian. He eats out a lot and likes to eat late at night. He loves milk and has cut back on the amount he drinks at night.   Increase Aerobic Exercise and Physical Activity Yes   Intervention While in program, learn and follow the exercise prescription taught. Start at a low level workload and increase workload after able to maintain previous level for 30 minutes. Increase time before increasing intensity.  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   Understand more about Heart/Pulmonary Disease. Yes   Intervention While in program utilize professionals for any questions, and attend the education sessions. Great websites to use are www.americanheart.org or www.lung.org for reliable information.  Mr Honeycutt is interested in learning more about COPD and  management of his daily life with COPD.   Develop more efficient breathing techniques such as purse lipped breathing and diaphragmatic  breathing; and practicing self-pacing with activity Yes    Intervention While in program, learn and utilize the specific breathing techniques taught to you. Continue to practice and use the techniques as needed.  Mr Higginson demonstrated good technique with PLB.   Increase knowledge of respiratory medications and ability to use respiratory devices properly.  Yes   Intervention While in program, learn to administer MDI, nebulizer, and spacer properly.;Learn to take respiratory medicine as ordered.;While in program, learn to Clean MDI, nebulizers, and spacers properly.  Mr Ackerley takes Symbicort, Proventil, and Spiriva.; educated him on the importance of using his spacer with the Proventil and Symbicort.   Hypertension Yes   Goal Participant will see blood pressure controlled within the values of 140/32m/Hg or within value directed by their physician.   Intervention Provide nutrition & aerobic exercise along with prescribed medications to achieve BP 140/90 or less.      Personal Goals and Risk Factors Review:    Personal Goals Discharge (Final Personal Goals and Risk Factors Review):    Comments: First day of exercise.  Plans to attend [facility] 3 days per week.

## 2014-10-27 ENCOUNTER — Encounter: Payer: Medicaid Other | Admitting: *Deleted

## 2014-10-27 DIAGNOSIS — J449 Chronic obstructive pulmonary disease, unspecified: Secondary | ICD-10-CM | POA: Diagnosis not present

## 2014-10-27 NOTE — Progress Notes (Signed)
Daily Session Note  Patient Details  Name: Nathaniel [name] MRN: [id number] Date of Birth: [birth year] Referring Provider:  Sharlyne Cai, NP  Encounter Date: 10/27/2014  Check In:     Session Check In - 10/27/14 1047    Check-In   Staff Present Frederich Cha RRT, RCP Respiratory Therapist;Derwin Reddy Dillard [place] MS, ACSM CEP Exercise Physiologist;Susanne Bice RN, BSN, CCRP   ER physicians immediately available to respond to emergencies [facility] immediately available ER MD   Physician(s) Jacqualine Code and Lord   Medication changes reported     No   Fall or balance concerns reported    No   Warm-up and Cool-down Performed on first and last piece of equipment   VAD Patient? No   Pain Assessment   Currently in Pain? No/denies   Multiple Pain Sites No           Exercise Prescription Changes - 10/27/14 1000    Exercise Review   Progression Yes   Response to Exercise   Symptoms None   Comments Reviewed individualized exercise prescription and made increases per departmental policy. Exercise increases were discussed with the patient and they were able to perform the new work loads without issue (no signs or symptoms).    Duration Progress to 30 minutes of continuous aerobic without signs/symptoms of physical distress   Intensity Rest + 30   Progression Continue progressive overload as per policy without signs/symptoms or physical distress.   Resistance Training   Training Prescription Yes   Weight 2   Reps 10-12   Interval Training   Interval Training No   Treadmill   MPH 2.7   Grade 0   Minutes 12   Recumbant Bike   Level 3.5   RPM 50   Minutes 15   NuStep   Level 3   Watts 50   Minutes 20      Goals Met:  Proper associated with RPD/PD & O2 Sat Independence with exercise equipment Using PLB without cueing & demonstrates good technique Exercise tolerated well Personal goals reviewed Strength training completed today  Goals Unmet:  Not Applicable  Goals Comments:  Increased speed and time on the treadmill today and did very well with it. Mr. Min says that he has not had a cigarette in one week.   Dr. Emily Filbert is Medical Director for [facility] and [facility].

## 2014-10-30 ENCOUNTER — Encounter: Payer: Medicaid Other | Admitting: *Deleted

## 2014-10-30 DIAGNOSIS — J449 Chronic obstructive pulmonary disease, unspecified: Secondary | ICD-10-CM

## 2014-10-30 NOTE — Progress Notes (Signed)
Daily Session Note  Patient Details  Name: Nathaniel [name] MRN: [id number] Date of Birth: [birth year] Referring Provider:  Sharlyne Cai, NP  Encounter Date: 10/30/2014  Check In:     Session Check In - 10/30/14 1150    Check-In   Staff Present Laureen Owens Shark BS, RRT, Respiratory Therapist;Cederick Broadnax Alfonso Patten, ACSM CEP Exercise Physiologist;Steven Way BS, ACSM EP-C, Exercise Physiologist   ER physicians immediately available to respond to emergencies [facility] immediately available ER MD   Physician(s) Dr. Jimmye Norman and Dr. Mariea Clonts   Medication changes reported     No   Fall or balance concerns reported    No   Warm-up and Cool-down Performed on first and last piece of equipment   VAD Patient? No   Pain Assessment   Currently in Pain? No/denies           Exercise Prescription Changes - 10/30/14 1100    Exercise Review   Progression Yes   Response to Exercise   Symptoms None   Comments Reviewed individualized exercise prescription and made increases per departmental policy. Exercise increases were discussed with the patient and they were able to perform the new work loads without issue (no signs or symptoms).    Duration Progress to 30 minutes of continuous aerobic without signs/symptoms of physical distress   Intensity Rest + 30   Progression Continue progressive overload as per policy without signs/symptoms or physical distress.   Resistance Training   Training Prescription Yes   Weight 2   Reps 10-12   Interval Training   Interval Training No   Treadmill   MPH 2.7   Grade 0   Minutes 15   Recumbant Bike   Level 3.5   RPM 50   Minutes 15   NuStep   Level 3   Watts 50   Minutes 20      Goals Met:  Proper associated with RPD/PD & O2 Sat Independence with exercise equipment Exercise tolerated well Personal goals reviewed Strength training completed today  Goals Unmet:  Not Applicable  Goals Comments: Reviewed individualized exercise prescription and made  increases per departmental policy. Exercise increases were discussed with the patient and they were able to perform the new work loads without issue (no signs or symptoms).     Dr. Emily Filbert is Medical Director for [facility] and [facility].

## 2014-11-03 ENCOUNTER — Encounter: Payer: Medicaid Other | Admitting: *Deleted

## 2014-11-03 DIAGNOSIS — J449 Chronic obstructive pulmonary disease, unspecified: Secondary | ICD-10-CM

## 2014-11-03 NOTE — Progress Notes (Signed)
Daily Session Note  Patient Details  Name: Nathaniel [name] MRN: [id number] Date of Birth: Jan 14, 1955 Referring Provider:  Sharlyne Cai, NP  Encounter Date: 11/03/2014  Check In:     Session Check In - 11/03/14 1044    Check-In   Staff Present Frederich Cha RRT, RCP Respiratory Therapist;Keelen Quevedo Dillard Essex MS, ACSM CEP Exercise Physiologist;Susanne Bice RN, BSN, CCRP   ER physicians immediately available to respond to emergencies [facility] immediately available ER MD   Physician(s) Archie Balboa and Cinda Quest   Medication changes reported     No   Fall or balance concerns reported    No   Warm-up and Cool-down Performed on first and last piece of equipment   VAD Patient? No   Pain Assessment   Currently in Pain? No/denies   Multiple Pain Sites No         Goals Met:  Proper associated with RPD/PD & O2 Sat Independence with exercise equipment Using PLB without cueing & demonstrates good technique Exercise tolerated well Personal goals reviewed Strength training completed today  Goals Unmet:  Not Applicable  Goals Comments: Patient completed exercise prescription and all exercise goals during rehab session. The exercise was tolerated well and the patient is progressing in the program.    Dr. Emily Filbert is Medical Director for [facility] and [facility].

## 2014-11-06 ENCOUNTER — Encounter: Payer: Medicaid Other | Admitting: *Deleted

## 2014-11-06 DIAGNOSIS — J449 Chronic obstructive pulmonary disease, unspecified: Secondary | ICD-10-CM

## 2014-11-06 NOTE — Progress Notes (Signed)
Daily Session Note  Patient Details  Name: Nathaniel [name] MRN: [id number] Date of Birth: [birth year] Referring Provider:  Sharlyne Cai, NP  Encounter Date: 11/06/2014  Check In:     Session Check In - 11/06/14 1054    Check-In   Staff Present Laureen Owens Shark BS, RRT, Respiratory Therapist;Lilac Hoff Alfonso Patten, ACSM CEP Exercise Physiologist;Steven Way BS, ACSM EP-C, Exercise Physiologist   ER physicians immediately available to respond to emergencies [facility] immediately available ER MD   Physician(s) Archie Balboa and Cinda Quest   Medication changes reported     No   Fall or balance concerns reported    No   Warm-up and Cool-down Performed on first and last piece of equipment   VAD Patient? No   Pain Assessment   Currently in Pain? No/denies           Exercise Prescription Changes - 11/06/14 1000    Exercise Review   Progression Yes   Response to Exercise   Symptoms None   Comments [name] was given increases in intensity on the TM, NS, and RB today. Reviewed individualized exercise prescription and made increases per departmental policy. Exercise increases were discussed with the patient and they were able to perform the new work loads without issue (no signs or symptoms).    Duration Progress to 30 minutes of continuous aerobic without signs/symptoms of physical distress   Intensity Rest + 30   Progression Continue progressive overload as per policy without signs/symptoms or physical distress.   Resistance Training   Training Prescription Yes   Weight 4   Reps 10-12   Interval Training   Interval Training No   Treadmill   MPH 3   Grade 0   Minutes 15   Recumbant Bike   Level 4   RPM 60   Minutes 15   NuStep   Level 4   Watts 50   Minutes 20      Goals Met:  Proper associated with RPD/PD & O2 Sat Independence with exercise equipment Exercise tolerated well Personal goals reviewed Strength training completed today  Goals Unmet:  Not Applicable  Goals Comments:  Reviewed individualized exercise prescription and made increases per departmental policy. Exercise increases were discussed with the patient and they were able to perform the new work loads without issue (no signs or symptoms).     Dr. Emily Filbert is Medical Director for [facility] and [facility].

## 2014-11-08 ENCOUNTER — Encounter: Payer: Medicaid Other | Attending: Nurse Practitioner

## 2014-11-08 DIAGNOSIS — J449 Chronic obstructive pulmonary disease, unspecified: Secondary | ICD-10-CM | POA: Diagnosis not present

## 2014-11-08 NOTE — Progress Notes (Signed)
Daily Session Note  Patient Details  Name: Macarius Ruark MRN: [id number] Date of Birth: October 28, 1955 Referring Provider:  Sharlyne Cai, NP  Encounter Date: 11/08/2014  Check In:     Session Check In - 11/08/14 1011    Check-In   Staff Present Lestine Box BS, ACSM EP-C, Exercise Physiologist;Laureen Janell Quiet, RRT, Respiratory Therapist   ER physicians immediately available to respond to emergencies [facility] immediately available ER MD   Physician(s) paduchowski and mcshane   Medication changes reported     No   Fall or balance concerns reported    No   Warm-up and Cool-down Performed on first and last piece of equipment   VAD Patient? No   Pain Assessment   Currently in Pain? No/denies         Goals Met:  Proper associated with RPD/PD & O2 Sat Exercise tolerated well No report of cardiac concerns or symptoms Strength training completed today  Goals Unmet:  Not Applicable  Goals Comments: Reviewed individualized exercise prescription and made increases per departmental policy. Exercise increases were discussed with the patient and they were able to perform the new work loads without issue (no signs or symptoms).     Dr. Emily Filbert is Medical Director for [facility] and [facility].

## 2014-11-10 ENCOUNTER — Encounter: Payer: Medicaid Other | Admitting: *Deleted

## 2014-11-10 DIAGNOSIS — J449 Chronic obstructive pulmonary disease, unspecified: Secondary | ICD-10-CM | POA: Diagnosis not present

## 2014-11-10 NOTE — Progress Notes (Signed)
Daily Session Note  Patient Details  Name: Nathaniel [name] MRN: [id number] Date of Birth: 06/15/1955 Referring Provider:  Ellamae Sia, MD  Encounter Date: 11/10/2014  Check In:     Session Check In - 11/10/14 1022    Check-In   Staff Present Frederich Cha RRT, RCP Respiratory Therapist;Karry Causer Dillard Essex MS, ACSM CEP Exercise Physiologist;Susanne Bice RN, BSN, CCRP   ER physicians immediately available to respond to emergencies [facility] immediately available ER MD   Physician(s) Marcelene Butte and Archie Balboa   Medication changes reported     No   Fall or balance concerns reported    No   Warm-up and Cool-down Performed on first and last piece of equipment   VAD Patient? No   Pain Assessment   Currently in Pain? No/denies   Multiple Pain Sites No         Goals Met:  Proper associated with RPD/PD & O2 Sat Independence with exercise equipment Using PLB without cueing & demonstrates good technique Exercise tolerated well Strength training completed today  Goals Unmet:  Not Applicable  Goals Comments: Patient completed exercise prescription and all exercise goals during rehab session. The exercise was tolerated well and the patient is progressing in the program.    Dr. Emily Filbert is Medical Director for [place] and [facility].

## 2014-11-13 ENCOUNTER — Encounter: Payer: Medicaid Other | Admitting: *Deleted

## 2014-11-13 DIAGNOSIS — J449 Chronic obstructive pulmonary disease, unspecified: Secondary | ICD-10-CM | POA: Diagnosis not present

## 2014-11-13 NOTE — Progress Notes (Signed)
Daily Session Note  Patient Details  Name: Nathaniel [name] MRN: [id number] Date of Birth: 05/13/1955 Referring Provider:  Strayhorn, Martha D, NP  Encounter Date: 11/13/2014  Check In:     Session Check In - 11/13/14 1409    Check-In   Staff Present Steven Way BS, ACSM EP-C, Exercise Physiologist;Kelly Hayes BS, ACSM CEP Exercise Physiologist;Laureen Brown BS, RRT, Respiratory Therapist   ER physicians immediately available to respond to emergencies [facility] immediately available ER MD   Physician(s) Stafford and Williams   Medication changes reported     No   Fall or balance concerns reported    No   Warm-up and Cool-down Performed on first and last piece of equipment   VAD Patient? No   Pain Assessment   Currently in Pain? No/denies   Multiple Pain Sites No           Exercise Prescription Changes - 11/13/14 1400    Exercise Review   Progression Yes   Response to Exercise   Blood Pressure (Admit) 120/64 mmHg   Blood Pressure (Exercise) 160/80 mmHg   Blood Pressure (Exit) 122/66 mmHg   Heart Rate (Admit) 85 bpm   Heart Rate (Exercise) 129 bpm   Heart Rate (Exit) 101 bpm   Oxygen Saturation (Admit) 96 %   Oxygen Saturation (Exercise) 93 %   Oxygen Saturation (Exit) 94 %   Rating of Perceived Exertion (Exercise) 14   Perceived Dyspnea (Exercise) 4   Symptoms None   Comments Draycen was given increases in intensity on the TM, NS, and RB today. Reviewed individualized exercise prescription and made increases per departmental policy. Exercise increases were discussed with the patient and they were able to perform the new work loads without issue (no signs or symptoms).    Duration Progress to 30 minutes of continuous aerobic without signs/symptoms of physical distress   Intensity Rest + 30   Progression Continue progressive overload as per policy without signs/symptoms or physical distress.   Resistance Training   Training Prescription Yes   Weight 5   Reps 10-12   Interval Training   Interval Training No   Treadmill   MPH 3.3   Grade 1   Minutes 15   Recumbant Bike   Level 5   RPM 60   Minutes 15   NuStep   Level 5   Watts 80   Minutes 20      Goals Met:  Proper associated with RPD/PD & O2 Sat Independence with exercise equipment Exercise tolerated well Personal goals reviewed Strength training completed today  Goals Unmet:  Not Applicable  Goals Comments: Reviewed individualized exercise prescription and made increases per departmental policy. Exercise increases were discussed with the patient and they were able to perform the new work loads without issue (no signs or symptoms).     Dr. Mark Miller is Medical Director for [facility] and [facility]. 

## 2014-11-13 NOTE — Progress Notes (Signed)
Pulmonary Individual Treatment Plan  Patient Details  Name: Nathaniel [name] MRN: [id number] Date of Birth: 1955/02/06 Referring Provider:  Dr. Ines Bloomer  Initial Encounter Date:  10/17/14  Visit Diagnosis: COPD, moderate ([place])  Patient's Home Medications on Admission:  Current outpatient prescriptions:  .  albuterol (PROVENTIL HFA;VENTOLIN HFA) 108 (90 BASE) MCG/ACT inhaler, Inhale 2 puffs into the lungs every 6 (six) hours as needed for wheezing or shortness of breath., Disp: , Rfl:  .  albuterol (PROVENTIL HFA;VENTOLIN HFA) 108 (90 BASE) MCG/ACT inhaler, Inhale into the lungs., Disp: , Rfl:  .  aspirin EC 81 MG tablet, Take 81 mg by mouth., Disp: , Rfl:  .  budesonide-formoterol (SYMBICORT) 160-4.5 MCG/ACT inhaler, Inhale into the lungs., Disp: , Rfl:  .  COMBIVENT RESPIMAT 20-100 MCG/ACT AERS respimat, Inhale 1 puff into the lungs daily., Disp: , Rfl: 11 .  enalapril (VASOTEC) 10 MG tablet, Take 1 tablet by mouth at bedtime., Disp: , Rfl: 11 .  Na Sulfate-K Sulfate-Mg Sulf (SUPREP BOWEL PREP) SOLN, Take 1 kit by mouth as directed., Disp: 1 Bottle, Rfl: 0 .  oxyCODONE-acetaminophen (PERCOCET/ROXICET) 5-325 MG per tablet, Take 1 tablet by mouth every 6 (six) hours as needed., Disp: , Rfl: 0 .  pravastatin (PRAVACHOL) 10 MG tablet, Take 10 mg by mouth daily., Disp: , Rfl:  .  sildenafil (VIAGRA) 100 MG tablet, Take 1 tablet by mouth as needed., Disp: , Rfl:  .  tiotropium (SPIRIVA HANDIHALER) 18 MCG inhalation capsule, Place into inhaler and inhale., Disp: , Rfl:   Past Medical History: Past Medical History  Diagnosis Date  . COPD (chronic obstructive pulmonary disease) ([place])   . Hypertension   . Chronic back pain     Dr. Quay Burow manages (per pt)  . MGUS (monoclonal gammopathy of unknown significance)   . Proteinuria   . Hearing loss   . Hyperlipidemia   . Tobacco abuse     Tobacco Use: History  Smoking status  . Current Some Day Smoker -- 2.00 packs/day for 45 years  .  Types: Cigarettes  Smokeless tobacco  . Never Used    Labs: Recent Review Flowsheet Data    There is no flowsheet data to display.       ADL UCSD:     ADL UCSD      10/17/14 1130       ADL UCSD   ADL Phase Entry     SOB Score total 48     Rest 1     Walk 3     Stairs 4     Bath 2     Dress 0     Shop 0         Pulmonary Function Assessment:     Pulmonary Function Assessment - 10/17/14 1130    Pulmonary Function Tests   RV% 270 %   DLCO% 67 %   Initial Spirometry Results   FVC% 45.9 %   FEV1% 23 %   FEV1/FVC Ratio 40   Post Bronchodilator Spirometry Results   FVC% 45.1 %   FEV1% 34 %   FEV1/FVC Ratio 40      Exercise Target Goals:    Exercise Program Goal: Individual exercise prescription set with THRR, safety & activity barriers. Participant demonstrates ability to understand and report RPE using BORG scale, to self-measure pulse accurately, and to acknowledge the importance of the exercise prescription.  Exercise Prescription Goal: Starting with aerobic activity 30 plus minutes [name] day, 3 days per  week for initial exercise prescription. Provide home exercise prescription and guidelines that participant acknowledges understanding prior to discharge.  Activity Barriers & Risk Stratification:     Activity Barriers & Risk Stratification - 10/17/14 1130    Activity Barriers & Risk Stratification   Activity Barriers Back Problems;Shortness of Breath;Deconditioning;Muscular Weakness   Risk Stratification Moderate      6 Minute Walk:     6 Minute Walk      10/19/14 1007       6 Minute Walk   Phase Initial     Distance 1340 feet     Walk Time 6 minutes     Resting HR 81 bpm     Resting BP 122/64 mmHg     Max Ex. HR 91 bpm     Max Ex. BP 142/62 mmHg     RPE 9     Perceived Dyspnea  2     Symptoms No        Initial Exercise Prescription:     Initial Exercise Prescription - 10/19/14 1000    Date of Initial Exercise Prescription   Date  10/17/14   Treadmill   MPH 2.5   Grade 0   Minutes 10   Recumbant Bike   Level 2   RPM 40   Watts 20   Minutes 10   NuStep   Level 3   Watts 50   Minutes 10   Arm Ergometer   Level 1   Watts 10   Minutes 10   Recumbant Elliptical   Level 1   RPM 40   Watts 20   Minutes 10   Elliptical   Level 1   Speed 3   Minutes 1   REL-XR   Level 3   Watts 50   Minutes 10   Prescription Details   Frequency (times per week) 3   Duration Progress to 30 minutes of continuous aerobic without signs/symptoms of physical distress   Intensity   THRR REST +  30   Ratings of Perceived Exertion 11-15   Perceived Dyspnea 2-4   Progression Continue progressive overload as per policy without signs/symptoms or physical distress.   Resistance Training   Training Prescription Yes   Weight 2   Reps 10-15      Exercise Prescription Changes:     Exercise Prescription Changes      10/20/14 1100 10/27/14 1000 10/30/14 1100 11/03/14 1100 11/06/14 1000   Exercise Review   Progression  Yes Yes Yes Yes   Response to Exercise   Blood Pressure (Admit) 122/70 mmHg       Blood Pressure (Exercise) 142/90 mmHg       Blood Pressure (Exit) 116/78 mmHg       Heart Rate (Admit) 78 bpm       Heart Rate (Exercise) 114 bpm       Heart Rate (Exit) 88 bpm       Oxygen Saturation (Admit) 97 %       Oxygen Saturation (Exercise) 96 %       Oxygen Saturation (Exit) 97 %       Rating of Perceived Exertion (Exercise) 13       Perceived Dyspnea (Exercise) 3       Symptoms None None None None None   Comments First day of exercise! Patient was oriented to the gym and the equipment functions and settings. Procedures and policies of the gym were outlined and explained. The patient's individual exercise prescription  and treatment plan were reviewed with them. All starting workloads were established based on the results of the functional testing  done at the initial intake visit. The plan for exercise progression was  also introduced and progression will be customized based on the patient's performance and goals.  Reviewed individualized exercise prescription and made increases per departmental policy. Exercise increases were discussed with the patient and they were able to perform the new work loads without issue (no signs or symptoms).   Nathaniel [name] says that he has not had [name] cigarette in 1 week. Reviewed individualized exercise prescription and made increases per departmental policy. Exercise increases were discussed with the patient and they were able to perform the new work loads without issue (no signs or symptoms).  Reviewed individualized exercise prescription and made increases per departmental policy. Exercise increases were discussed with the patient and they were able to perform the new work loads without issue (no signs or symptoms).  Nathaniel [name] was given increases in intensity on the TM, NS, and RB today. Reviewed individualized exercise prescription and made increases per departmental policy. Exercise increases were discussed with the patient and they were able to perform the new work loads without issue (no signs or symptoms).    Duration Progress to 30 minutes of continuous aerobic without signs/symptoms of physical distress Progress to 30 minutes of continuous aerobic without signs/symptoms of physical distress Progress to 30 minutes of continuous aerobic without signs/symptoms of physical distress Progress to 30 minutes of continuous aerobic without signs/symptoms of physical distress Progress to 30 minutes of continuous aerobic without signs/symptoms of physical distress   Intensity Rest + 30 Rest + 30 Rest + 30 Rest + 30 Rest + 30   Progression Continue progressive overload as per policy without signs/symptoms or physical distress. Continue progressive overload as per policy without signs/symptoms or physical distress. Continue progressive overload as per policy without signs/symptoms or physical distress. Continue  progressive overload as per policy without signs/symptoms or physical distress. Continue progressive overload as per policy without signs/symptoms or physical distress.   Resistance Training   Training Prescription Yes Yes Yes Yes Yes   Weight 2 2 2 4 4    Reps 10-12 10-12 10-12 10-12 10-12   Interval Training   Interval Training No No No No No   Treadmill   MPH 2.6 2.7 2.7 2.7 3   Grade 0 0 0 0 0   Minutes 10 12 15 15 15    Recumbant Bike   Level 3.5 3.5 3.5 3.5 4   RPM 50 50 50 50 60   Minutes 15 15 15 15 15    NuStep   Level 3 3 3 3 4    Watts 50 50 50 50 50   Minutes 20 20 20 20 20      11/08/14 1000 11/13/14 1400         Exercise Review   Progression Yes Yes      Response to Exercise   Blood Pressure (Admit)  120/64 mmHg      Blood Pressure (Exercise)  160/80 mmHg      Blood Pressure (Exit)  122/66 mmHg      Heart Rate (Admit)  85 bpm      Heart Rate (Exercise)  129 bpm      Heart Rate (Exit)  101 bpm      Oxygen Saturation (Admit)  96 %      Oxygen Saturation (Exercise)  93 %      Oxygen Saturation (Exit)  94 %      Rating of Perceived Exertion (Exercise)  14      Perceived Dyspnea (Exercise)  4      Symptoms None None      Comments Nathaniel [name] was given increases in intensity on the TM, NS, and RB today. Reviewed individualized exercise prescription and made increases per departmental policy. Exercise increases were discussed with the patient and they were able to perform the new work loads without issue (no signs or symptoms).  Nathaniel [name] was given increases in intensity on the TM, NS, and RB today. Reviewed individualized exercise prescription and made increases per departmental policy. Exercise increases were discussed with the patient and they were able to perform the new work loads without issue (no signs or symptoms).       Duration Progress to 30 minutes of continuous aerobic without signs/symptoms of physical distress Progress to 30 minutes of continuous aerobic without  signs/symptoms of physical distress      Intensity Rest + 30 Rest + 30      Progression Continue progressive overload as per policy without signs/symptoms or physical distress. Continue progressive overload as per policy without signs/symptoms or physical distress.      Resistance Training   Training Prescription Yes Yes      Weight 4 5      Reps 10-12 10-12      Interval Training   Interval Training No No      Treadmill   MPH 3 3.3      Grade 1 1      Minutes 15 15      Recumbant Bike   Level 4 5      RPM 60 60      Minutes 15 15      NuStep   Level 5 5      Watts 80 80      Minutes 20 20         Discharge Exercise Prescription (Final Exercise Prescription Changes):     Exercise Prescription Changes - 11/13/14 1400    Exercise Review   Progression Yes   Response to Exercise   Blood Pressure (Admit) 120/64 mmHg   Blood Pressure (Exercise) 160/80 mmHg   Blood Pressure (Exit) 122/66 mmHg   Heart Rate (Admit) 85 bpm   Heart Rate (Exercise) 129 bpm   Heart Rate (Exit) 101 bpm   Oxygen Saturation (Admit) 96 %   Oxygen Saturation (Exercise) 93 %   Oxygen Saturation (Exit) 94 %   Rating of Perceived Exertion (Exercise) 14   Perceived Dyspnea (Exercise) 4   Symptoms None   Comments Nathaniel [name] was given increases in intensity on the TM, NS, and RB today. Reviewed individualized exercise prescription and made increases per departmental policy. Exercise increases were discussed with the patient and they were able to perform the new work loads without issue (no signs or symptoms).    Duration Progress to 30 minutes of continuous aerobic without signs/symptoms of physical distress   Intensity Rest + 30   Progression Continue progressive overload as per policy without signs/symptoms or physical distress.   Resistance Training   Training Prescription Yes   Weight 5   Reps 10-12   Interval Training   Interval Training No   Treadmill   MPH 3.3   Grade 1   Minutes 15   Recumbant Bike    Level 5   RPM 60   Minutes 15   NuStep   Level 5   Watts  80   Minutes 20       Nutrition:  Target Goals: Understanding of nutrition guidelines, daily intake of sodium <1557m, cholesterol <2031m calories 30% from fat and 7% or less from saturated fats, daily to have 5 or more servings of fruits and vegetables.  Biometrics:     Pre Biometrics - 10/19/14 1012    Pre Biometrics   Height 6' 1"  (1.854 m)   Weight 236 lb 8 oz (107.276 kg)   Waist Circumference 43.5 inches   Hip Circumference 44 inches   Waist to Hip Ratio 0.99 %   BMI (Calculated) 31.3       Nutrition Therapy Plan and Nutrition Goals:     Nutrition Therapy & Goals - 10/17/14 1130    Nutrition Therapy   Diet Nathaniel SmCuberoould like to meet with the dietitian. He eats out [name] lot and likes to eat late at night. He loves milk and has cut back on the amount he drinks at night.      Nutrition Discharge: Rate Your Plate Scores:   Psychosocial: Target Goals: Acknowledge presence or absence of depression, maximize coping skills, provide positive support system. Participant is able to verbalize types and ability to use techniques and skills needed for reducing stress and depression.  Initial Review & Psychosocial Screening:     Initial Psych Review & Screening - 10/17/14 1130    Family Dynamics   Good Support System? Yes   Comments Nathaniel [name] good support from his 3 children and wife. He is managing his COPD well with pacing through activities. 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   Barriers   Psychosocial barriers to participate in program There are no identifiable barriers or psychosocial needs.;The patient should benefit from training in stress management and relaxation.   Screening Interventions   Interventions Encouraged to exercise      Quality of Life Scores:     Quality of Life - 10/17/14 1130    Quality of Life Scores    Health/Function Pre 17.23 %   Socioeconomic Pre 17 %   Psych/Spiritual Pre 22.55 %   Family Pre 22.8 %   GLOBAL Pre 18.98 %      PHQ-9:     Recent Review Flowsheet Data    Depression screen PHQ 2/9 10/17/2014   Decreased Interest 1   Down, Depressed, Hopeless 0   PHQ - 2 Score 1      Psychosocial Evaluation and Intervention:     Psychosocial Evaluation - 11/06/14 1053    Psychosocial Evaluation & Interventions   Interventions Stress management education;Relaxation education;Encouraged to exercise with the program and follow exercise prescription   Comments Counselor met with Nathaniel. [name] for initial psychosocial evaluation.  He is [name] [age]ear old who has COPD and multiple health issues, including kidney disease, degenerative disk problems in his lower back and [name] blood cancer disease that is currently in remission.  Nathaniel. [name] [name] strong support system with [name] significant other of 5 years and an adult son who lives close by.  He reports he was sleeping too much and eating too much until recently when he quit smoking, began new medication and working out, so these things have improved.  Nathaniel. SmHelsleyenies [name] history of depression or anxiety or current symptoms.  He admitted to being very "grouchy" after he quit smoking several weeks ago, but that has improved with time.  He states he is typically in [name] positive mood most of the time.  Nathaniel. [name] reports he has some stress in his life as he is around people who smoke frequently and this is counterproductive to his goal to quit.  Counselor worked with him on "triggers" and ways to change his lifestyle in order to promote success in this area.  His primary goal is to dance an entire song without stopping by this end of this pulmonary program.  Nathaniel. [name] will benefit from meeting with the dietician to accomplish his weight loss and healthier eating goals.  Counselor will follow up with Nathaniel. [name] on these goals.   Continued Psychosocial Services  Needed Yes  Nathaniel. [name] will benefit from the psychoeducational components of this program in order to help him relax and handle the stress of quitting smoking better.  He will also benefit from seeing the dietician for weight loss goals.        Psychosocial Re-Evaluation:  Education: Education Goals: Education classes will be provided on [name] weekly basis, covering required topics. Participant will state understanding/return demonstration of topics presented.  Learning Barriers/Preferences:     Learning Barriers/Preferences - 10/17/14 1130    Learning Barriers/Preferences   Learning Barriers None   Learning Preferences Group Instruction;Individual Instruction;Pictoral;Skilled Demonstration;Verbal Instruction;Video;Written Material      Education Topics: Initial Evaluation Education: - Verbal, written and demonstration of respiratory meds, RPE/PD scales, oximetry and breathing techniques. Instruction on use of nebulizers and MDIs: cleaning and proper use, rinsing mouth with steroid doses and importance of monitoring MDI activations.          Pulmonary Rehab from 11/13/2014 in [place]   Date  10/17/14   Educator  LB    Instruction Review Code  2- meets goals/outcomes      General Nutrition Guidelines/Fats and Fiber: -Group instruction provided by verbal, written material, models and posters to present the general guidelines for heart healthy nutrition. Gives an explanation and review of dietary fats and fiber.   Controlling Sodium/Reading Food Labels: -Group verbal and written material supporting the discussion of sodium use in heart healthy nutrition. Review and explanation with models, verbal and written materials for utilization of the food label.      Pulmonary Rehab from 11/13/2014 in [place]   Date  10/30/14   Educator  CR   Instruction Review Code  2- meets goals/outcomes      Exercise  Physiology & Risk Factors: - Group verbal and written instruction with models to review the exercise physiology of the cardiovascular system and associated critical values. Details cardiovascular disease risk factors and the goals associated with each risk factor.   Aerobic Exercise & Resistance Training: - Gives group verbal and written discussion on the health impact of inactivity. On the components of aerobic and resistive training programs and the benefits of this training and how to safely progress through these programs.   Flexibility, Balance, General Exercise Guidelines: - Provides group verbal and written instruction on the benefits of flexibility and balance training programs. Provides general exercise guidelines with specific guidelines to those with heart or lung disease. Demonstration and skill practice provided.   Stress Management: - Provides group verbal and written instruction about the health risks of elevated stress, cause of high stress, and healthy ways to reduce stress.      Pulmonary Rehab from 11/13/2014 in [place]   Date  11/08/14  Educator  [place]   Instruction Review Code  2- meets goals/outcomes      Depression: - Provides group verbal and written instruction on the correlation between heart/lung disease and depressed mood, treatment options, and the stigmas associated with seeking treatment.   Exercise & Equipment Safety: - Individual verbal instruction and demonstration of equipment use and safety with use of the equipment.      Pulmonary Rehab from 11/13/2014 in [place]   Date  10/20/14   Educator  [place]   Instruction Review Code  2- meets goals/outcomes      Infection Prevention: - Provides verbal and written material to individual with discussion of infection control including proper hand washing and proper equipment cleaning during exercise session.      Pulmonary Rehab from  11/13/2014 in [place]   Date  10/20/14   Educator  [place]   Instruction Review Code  2- meets goals/outcomes      Falls Prevention: - Provides verbal and written material to individual with discussion of falls prevention and safety.      Pulmonary Rehab from 11/13/2014 in [place]   Date  10/17/14   Educator  LB   Instruction Review Code  2- meets goals/outcomes      Diabetes: - Individual verbal and written instruction to review signs/symptoms of diabetes, desired ranges of glucose level fasting, after meals and with exercise. Advice that pre and post exercise glucose checks will be done for 3 sessions at entry of program.   Chronic Lung Diseases: - Group verbal and written instruction to review new updates, new respiratory medications, new advancements in procedures and treatments. Provide informative websites and "800" numbers of self-education.      Pulmonary Rehab from 11/13/2014 in [place]   Date  11/13/14   Educator  LB   Instruction Review Code  2- meets goals/outcomes      Lung Procedures: - Group verbal and written instruction to describe testing methods done to diagnose lung disease. Review the outcome of test results. Describe the treatment choices: Pulmonary Function Tests, ABGs and oximetry.      Pulmonary Rehab from 11/13/2014 in [place]   Date  11/10/14   Educator  [place]   Instruction Review Code  2- meets goals/outcomes      Energy Conservation: - Provide group verbal and written instruction for methods to conserve energy, plan and organize activities. Instruct on pacing techniques, use of adaptive equipment and posture/positioning to relieve shortness of breath.   Triggers: - Group verbal and written instruction to review types of environmental controls: home humidity, furnaces, filters, dust mite/pet  prevention, HEPA vacuums. To discuss weather changes, air quality and the benefits of nasal washing.   Exacerbations: - Group verbal and written instruction to provide: warning signs, infection symptoms, calling MD promptly, preventive modes, and value of vaccinations. Review: effective airway clearance, coughing and/or vibration techniques. Create an Sports administrator.   Oxygen: - Individual and group verbal and written instruction on oxygen therapy. Includes supplement oxygen, available portable oxygen systems, continuous and intermittent flow rates, oxygen safety, concentrators, and Medicare reimbursement for oxygen.   Respiratory Medications: - Group verbal and written instruction to review medications for lung disease. Drug class, frequency, complications, importance of spacers, rinsing mouth after steroid MDI's, and proper cleaning methods for nebulizers.      Pulmonary Rehab from 11/13/2014 in [facility]  [place] [facility]   Date  10/17/14   Educator  LB   Instruction Review Code  2- meets goals/outcomes      AED/CPR: - Group verbal and written instruction with the use of models to demonstrate the basic use of the AED with the basic ABC's of resuscitation.   Breathing Retraining: - Provides individuals verbal and written instruction on purpose, frequency, and proper technique of diaphragmatic breathing and pursed-lipped breathing. Applies individual practice skills.      Pulmonary Rehab from 11/13/2014 in [place]   Date  10/20/14   Educator  [place]   Instruction Review Code  2- meets goals/outcomes      Anatomy and Physiology of the Lungs: - Group verbal and written instruction with the use of models to provide basic lung anatomy and physiology related to function, structure and complications of lung disease.   Heart Failure: - Group verbal and written instruction on the basics of heart failure: signs/symptoms, treatments,  explanation of ejection fraction, enlarged heart and cardiomyopathy.      Pulmonary Rehab from 11/13/2014 in [place]   Date  10/27/14   Educator  [place]   Instruction Review Code  2- meets goals/outcomes      Sleep Apnea: - Individual verbal and written instruction to review Obstructive Sleep Apnea. Review of risk factors, methods for diagnosing and types of masks and machines for OSA.   Anxiety: - Provides group, verbal and written instruction on the correlation between heart/lung disease and anxiety, treatment options, and management of anxiety.   Relaxation: - Provides group, verbal and written instruction about the benefits of relaxation for patients with heart/lung disease. Also provides patients with examples of relaxation techniques.   Knowledge Questionnaire Score:     Knowledge Questionnaire Score - 10/17/14 1130    Knowledge Questionnaire Score   Pre Score -5      Personal Goals and Risk Factors at Admission:     Personal Goals and Risk Factors at Admission - 11/03/14 1058    Personal Goals and Risk Factors on Admission   Lipids Yes  On meds for cholesterol control.   Goal Cholesterol controlled with medications as prescribed, with individualized exercise RX and with personalized nutrition plan. Value goals: LDL < 23m, HDL > 455m Participant states understanding of desired cholesterol values and following prescriptions.   Intervention Provide nutrition & aerobic exercise along with prescribed medications to achieve LDL <7063mHDL >19m70m    Personal Goals and Risk Factors Review:      Goals and Risk Factor Review      10/27/14 1417 10/30/14 1000 11/03/14 1054 11/03/14 1059 11/03/14 1111   Weight Management   Goals Progress/Improvement seen   No     Comments   No weight change noted yet  5 sessions attended. To make appointment with RD. HAs cut down on milk consumption,and is working on prtion control. Still need uidance  on calories/fat/portion control. Has started working on changing cereals from "sugar Crack" to whole grain cereals. HAs decreased sweets intake too.     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      Quit Smoking   Goals  Progress/Improvement seen Yes Yes      Comments Nathaniel. [name] has not had [name] cigarette in 1 week. Nathaniel Wann admits he smokes occasionally and with stress. His trip was for legal matters and was very stressful, but he did not smoke there and states he has not had [name] cigarrette since his start date in [place], 10/17/2014.      Breathing Techniques   Goals Progress/Improvement seen   Yes   --  Lemmie had breathing tests done this past Wednesday at the referring MD office. He was told his numbers are improved.   Comments  Nathaniel Higham is using PLB with his exercise goals and activites at home and states that it is helpful for shortness of breath.      Hypertension   Goal   --  Kouper does not have HTN listed as [name] risk factor.     Abnormal Lipids   Progress seen towards goals    Unknown    Comments    On meds for cholesterol control. Is working on eating habits,waiting to see the RD for advice.      11/06/14 1501 11/08/14 1000         Quit Smoking   Goals Progress/Improvement seen  Yes      Comments  Nathaniel Dady had 2 cigarettes yesterday when on [name] job site with his [employer] company. This is when it is difficult to not smoke when around other people are smoking.      Understand more about Heart/Pulmonary Disease   Goals Progress/Improvement seen  Yes       Comments Educated Nathaniel Theiler on his PFT and related it to his COPD. Educated him on the goal to maintain his Spirometry results, prevent them from worsening, as well as his COPD, by exercising, smoking  cessation, using his MDI's, good diet, and wearing oxygen if needed.       Improve shortness of breath with ADL's   Goals Progress/Improvement seen   Yes      Comments  Nathaniel Jiminez was having [name] "bad breathing day". He had mowed the other day and inhaled dusty air. We talked about hime wearing [name] mask with yardwork, nasal washing after the work, and using his inhaler for rescue.      Increase knowledge of respiratory medications   Goals Progress/Improvement seen   Yes      Comments  Nathaniel Nicodemus has [name] good understanding of his MDI's. We discussed using his Albuterol MDI before exercise in [facility] and the importance of Spiriva with exercise.         Personal Goals Discharge (Final Personal Goals and Risk Factors Review):      Goals and Risk Factor Review - 11/08/14 1000    Quit Smoking   Goals Progress/Improvement seen Yes   Comments Nathaniel Macomber had 2 cigarettes yesterday when on [name] job site with his [employer] company. This is when it is difficult to not smoke when around other people are smoking.   Improve shortness of breath with ADL's   Goals Progress/Improvement seen  Yes   Comments Nathaniel Wingrove was having [name] "bad breathing day". He had mowed the other day and inhaled dusty air. We talked about hime wearing [name] mask with yardwork, nasal washing after the work, and using his inhaler for rescue.   Increase knowledge of respiratory medications   Goals Progress/Improvement seen  Yes   Comments Nathaniel Husak has [name] good understanding of his MDI's. We discussed using his  Albuterol MDI before exercise in [facility] and the importance of Spiriva with exercise.      Comments: 30 Day Review

## 2014-11-15 DIAGNOSIS — J449 Chronic obstructive pulmonary disease, unspecified: Secondary | ICD-10-CM

## 2014-11-15 NOTE — Progress Notes (Signed)
Daily Session Note  Patient Details  Name: Lamorris Knoblock MRN: [id number] Date of Birth: May 17, 1955 Referring Provider:  Sharlyne Cai, NP  Encounter Date: 11/15/2014  Check In:     Session Check In - 11/15/14 1016    Check-In   Staff Present Lestine Box BS, ACSM EP-C, Exercise Physiologist;Carroll Enterkin RN, Drusilla Kanner MS, ACSM CEP Exercise Physiologist   ER physicians immediately available to respond to emergencies [facility] immediately available ER MD   Physician(s) Marcelene Butte and Darl Householder   Medication changes reported     No   Fall or balance concerns reported    No   Warm-up and Cool-down Performed on first and last piece of equipment   VAD Patient? No   Pain Assessment   Currently in Pain? No/denies         Goals Met:  Proper associated with RPD/PD & O2 Sat Exercise tolerated well No report of cardiac concerns or symptoms Strength training completed today  Goals Unmet:  Not Applicable  Goals Comments: Patient completed exercise prescription and all exercise goals during rehab sessions.The exercise was tolerated well, and the patient is progressing in the program.   Dr. Emily Filbert is Medical Director for [place] and [facility].

## 2014-11-17 ENCOUNTER — Encounter: Payer: Medicaid Other | Admitting: *Deleted

## 2014-11-17 ENCOUNTER — Encounter: Payer: Self-pay | Admitting: Respiratory Therapy

## 2014-11-17 DIAGNOSIS — J449 Chronic obstructive pulmonary disease, unspecified: Secondary | ICD-10-CM | POA: Diagnosis not present

## 2014-11-17 NOTE — Progress Notes (Signed)
Daily Session Note  Patient Details  Name: Nathaniel [name] MRN: [id number] Date of Birth: 21-Sep-1955 Referring Provider:  Sharlyne Cai, NP  Encounter Date: 11/17/2014  Check In:     Session Check In - 11/17/14 1101    Check-In   Staff Present Gerlene Burdock RN, BSN;Stacey Blanch Media RRT, RCP Respiratory Therapist;Renee Dillard Essex MS, ACSM CEP Exercise Physiologist   ER physicians immediately available to respond to emergencies [facility] immediately available ER MD   Physician(s) Dr. Jimmye Norman and Dr. Corky Downs   Medication changes reported     No   Fall or balance concerns reported    No   Warm-up and Cool-down Performed on first and last piece of equipment   VAD Patient? No   Pain Assessment   Currently in Pain? No/denies         Goals Met:  Proper associated with RPD/PD & O2 Sat Exercise tolerated well  Goals Unmet:  Not Applicable  Goals Comments: Nustep increase to level 6, 80 watts today's goal.    Dr. Emily Filbert is Medical Director for [place] and [facility].

## 2014-11-20 ENCOUNTER — Encounter: Payer: Medicaid Other | Admitting: *Deleted

## 2014-11-20 DIAGNOSIS — J449 Chronic obstructive pulmonary disease, unspecified: Secondary | ICD-10-CM

## 2014-11-20 NOTE — Progress Notes (Signed)
Daily Session Note  Patient Details  Name: Nathaniel [name] MRN: [id number] Date of Birth: Dec 07, 1955 Referring Provider:  Sharlyne Cai, NP  Encounter Date: 11/20/2014  Check In:     Session Check In - 11/20/14 1101    Check-In   Staff Present Laureen Owens Shark BS, RRT, Respiratory Therapist;Zamoria Boss Alfonso Patten, ACSM CEP Exercise Physiologist;Steven Way BS, ACSM EP-C, Exercise Physiologist   ER physicians immediately available to respond to emergencies [facility] immediately available ER MD   Physician(s) Reita Cliche and Corky Downs   Medication changes reported     No   Fall or balance concerns reported    No   Warm-up and Cool-down Performed on first and last piece of equipment   VAD Patient? No   Pain Assessment   Currently in Pain? No/denies   Multiple Pain Sites No         Goals Met:  Proper associated with RPD/PD & O2 Sat Independence with exercise equipment Exercise tolerated well Strength training completed today  Goals Unmet:  Not Applicable  Goals Comments: Patient completed exercise prescription and all exercise goals during rehab session. The exercise was tolerated well and the patient is progressing in the program.  The patient's personal and exercise goals are anticipated to be met in 24 sessions.     Dr. Emily Filbert is Medical Director for [place] and [facility].

## 2014-11-22 ENCOUNTER — Encounter: Payer: Medicaid Other | Admitting: *Deleted

## 2014-11-22 DIAGNOSIS — J449 Chronic obstructive pulmonary disease, unspecified: Secondary | ICD-10-CM | POA: Diagnosis not present

## 2014-11-22 NOTE — Progress Notes (Signed)
Daily Session Note  Patient Details  Name: Nathaniel [name] MRN: [id number] Date of Birth: 1955-05-22 Referring Provider:  Sharlyne Cai, NP  Encounter Date: 11/22/2014  Check In:     Session Check In - 11/22/14 1040    Check-In   Staff Present Laureen Owens Shark BS, RRT, Respiratory Therapist;Bernadean Saling Dillard Essex MS, ACSM CEP Exercise Physiologist;Steven Way BS, ACSM EP-C, Exercise Physiologist   ER physicians immediately available to respond to emergencies [facility] immediately available ER MD   Physician(s) Jimmye Norman and Joni Fears   Medication changes reported     No   Fall or balance concerns reported    No   Warm-up and Cool-down Performed on first and last piece of equipment   VAD Patient? No   Pain Assessment   Currently in Pain? No/denies   Multiple Pain Sites No           Exercise Prescription Changes - 11/22/14 1000    Exercise Review   Progression Yes   Response to Exercise   Symptoms None   Comments Reviewed individualized exercise prescription and made increases per departmental policy. Exercise increases were discussed with the patient and they were able to perform the new work loads without issue (no signs or symptoms).    Duration Progress to 30 minutes of continuous aerobic without signs/symptoms of physical distress   Intensity Rest + 30   Progression Continue progressive overload as per policy without signs/symptoms or physical distress.   Resistance Training   Training Prescription Yes   Weight 5   Reps 10-12   Interval Training   Interval Training No   Treadmill   MPH 3.3   Grade 1   Minutes 15   Recumbant Bike   Level 5   RPM 60   Minutes 15   NuStep   Level 6   Watts 20  80W was for the older style NS, Vanessa is now on newer style   Minutes 20      Goals Met:  Proper associated with RPD/PD & O2 Sat Independence with exercise equipment Using PLB without cueing & demonstrates good technique Exercise tolerated well Personal goals  reviewed No report of cardiac concerns or symptoms  Goals Unmet:  Not Applicable  Goals Comments: Patient completed exercise prescription and all exercise goals during rehab session. The exercise was tolerated well and the patient is progressing in the program.   Personal and exercise goals expected to be met in 23 more sessions. Progress on specific individualized goals will be charted in patient's ITP. Upon completion of the program the patient will be comfortable managing exercise goals and progression on their own.    Dr. Emily Filbert is Medical Director for [place] and [facility].

## 2014-11-27 ENCOUNTER — Encounter: Payer: Medicaid Other | Admitting: *Deleted

## 2014-11-27 DIAGNOSIS — J449 Chronic obstructive pulmonary disease, unspecified: Secondary | ICD-10-CM

## 2014-11-27 NOTE — Progress Notes (Signed)
Daily Session Note  Patient Details  Name: Nathaniel [name] MRN: [id number] Date of Birth: 1955/12/04 Referring Provider:  Sharlyne Cai, NP  Encounter Date: 11/27/2014  Check In:     Session Check In - 11/27/14 1047    Check-In   Staff Present Lestine Box BS, ACSM EP-C, Exercise Physiologist;Carroll Enterkin RN, BSN;Nycole Kawahara BS, ACSM CEP Exercise Physiologist   ER physicians immediately available to respond to emergencies [facility] immediately available ER MD   Physician(s) Jimmye Norman and Corky Downs   Medication changes reported     No   Fall or balance concerns reported    No   Warm-up and Cool-down Performed on first and last piece of equipment   VAD Patient? No   Pain Assessment   Currently in Pain? No/denies   Multiple Pain Sites No         Goals Met:  Proper associated with RPD/PD & O2 Sat Independence with exercise equipment Exercise tolerated well Strength training completed today  Goals Unmet:  Not Applicable  Goals Comments: Patient completed exercise prescription and all exercise goals during rehab session. The exercise was tolerated well and the patient is progressing in the program.     Dr. Emily Filbert is Medical Director for [place] and [facility].

## 2014-11-27 NOTE — Progress Notes (Signed)
Daily Session Note  Patient Details  Name: Nathaniel [name] MRN: [id number] Date of Birth: Nov 04, 1955 Referring Provider:  Sharlyne Cai, NP  Encounter Date: 11/27/2014  Check In:     Session Check In - 11/27/14 1047    Check-In   Staff Present Lestine Box BS, ACSM EP-C, Exercise Physiologist;Treniya Lobb RN, BSN;Kelly Hayes BS, ACSM CEP Exercise Physiologist   ER physicians immediately available to respond to emergencies [facility] immediately available ER MD   Physician(s) Jimmye Norman and Corky Downs   Medication changes reported     No   Fall or balance concerns reported    No   Warm-up and Cool-down Performed on first and last piece of equipment   VAD Patient? No   Pain Assessment   Currently in Pain? No/denies   Multiple Pain Sites No         Goals Met:  Proper associated with RPD/PD & O2 Sat  Goals Unmet:  Using a lot of his inhalers so he is suppose to call his MD.  Goals Comments: [name] used all his inhalers the other day so [name], RN instructed him to call his MD. Today he reported he has limited his cigarette smoking except Thursday he smoked 14 cigarettes. He has also been around [employer].   Dr. Emily Filbert is Medical Director for [place] and [facility].

## 2014-11-29 DIAGNOSIS — J449 Chronic obstructive pulmonary disease, unspecified: Secondary | ICD-10-CM | POA: Diagnosis not present

## 2014-11-29 NOTE — Progress Notes (Signed)
Daily Session Note  Patient Details  Name: Travell Desaulniers MRN: [id number] Date of Birth: [birth year] Referring Provider:  Sharlyne Cai, NP  Encounter Date: 11/29/2014  Check In:     Session Check In - 11/29/14 1035    Check-In   Staff Present Heath Lark, RN, BSN, CCRP;Marcea Rojek, BS, ACSM EP-C, Exercise Physiologist   ER physicians immediately available to respond to emergencies [facility] immediately available ER MD   Physician(s) gayle and stafford   Medication changes reported     No   Fall or balance concerns reported    No   Warm-up and Cool-down Performed on first and last piece of equipment   VAD Patient? No   Pain Assessment   Currently in Pain? No/denies         Goals Met:  Proper associated with RPD/PD & O2 Sat Exercise tolerated well No report of cardiac concerns or symptoms Strength training completed today  Goals Unmet:  Not Applicable  Goals Comments:    Dr. Emily Filbert is Medical Director for [place] and [facility].

## 2014-12-04 DIAGNOSIS — J449 Chronic obstructive pulmonary disease, unspecified: Secondary | ICD-10-CM

## 2014-12-04 NOTE — Progress Notes (Signed)
Daily Session Note  Patient Details  Name: Nathaniel [name] MRN: [id number] Date of Birth: 1955/08/25 Referring Provider:  Sharlyne Cai, NP  Encounter Date: 12/04/2014  Check In:     Session Check In - 12/04/14 1051    Check-In   Staff Present Carson Myrtle, BS, RRT, Respiratory Bertis Ruddy, BS, ACSM CEP, Exercise Physiologist;Steven Way, BS, ACSM EP-C, Exercise Physiologist   ER physicians immediately available to respond to emergencies [facility] immediately available ER MD   Physician(s) Jimmye Norman and Schaevit   Medication changes reported     No   Fall or balance concerns reported    No   Warm-up and Cool-down Performed on first and last piece of equipment   VAD Patient? No   Pain Assessment   Currently in Pain? No/denies   Multiple Pain Sites No         Goals Met:  Proper associated with RPD/PD & O2 Sat Independence with exercise equipment Exercise tolerated well Personal goals reviewed Strength training completed today  Goals Unmet:  Not Applicable  Goals Comments: Patient completed exercise prescription and all exercise goals during rehab session. The exercise was tolerated well and the patient is progressing in the program.  Patient's personal exercise goals were reviewed with him today. See ITP for full review.    Dr. Emily Filbert is Medical Director for [place] and [facility].

## 2014-12-06 ENCOUNTER — Encounter: Payer: Medicaid Other | Admitting: *Deleted

## 2014-12-06 DIAGNOSIS — J449 Chronic obstructive pulmonary disease, unspecified: Secondary | ICD-10-CM | POA: Diagnosis not present

## 2014-12-06 NOTE — Progress Notes (Signed)
Daily Session Note  Patient Details  Name: Nathaniel [name] MRN: [id number] Date of Birth: 1955/09/12 Referring Provider:  Sharlyne Cai, NP  Encounter Date: 12/06/2014  Check In:     Session Check In - 12/06/14 1101    Check-In   Staff Present Carson Myrtle, BS, RRT, Respiratory Therapist;Steven Way, BS, ACSM EP-C, Exercise Physiologist;Lavelle Berland Dillard Essex, MS, ACSM CEP, Exercise Physiologist   ER physicians immediately available to respond to emergencies [facility] immediately available ER MD   Physician(s) Jimmye Norman and Clearnce Hasten   Medication changes reported     No   Fall or balance concerns reported    No   Warm-up and Cool-down Performed on first and last piece of equipment   VAD Patient? No   Pain Assessment   Currently in Pain? No/denies   Multiple Pain Sites No         Goals Met:  Proper associated with RPD/PD & O2 Sat Independence with exercise equipment Using PLB without cueing & demonstrates good technique Exercise tolerated well Personal goals reviewed Strength training completed today  Goals Unmet:  Not Applicable  Goals Comments: Patient completed exercise prescription and all exercise goals during rehab session. The exercise was tolerated well and the patient is progressing in the program. Personal and exercise goals expected to be met in 19 more sessions. Progress on specific individualized goals will be charted in patient's ITP. Upon completion of the program the patient will be comfortable managing exercise goals and progression on their own.   Dr. Emily Filbert is Medical Director for [place] and [facility].

## 2014-12-08 ENCOUNTER — Encounter: Payer: Medicaid Other | Attending: Nurse Practitioner

## 2014-12-08 DIAGNOSIS — J449 Chronic obstructive pulmonary disease, unspecified: Secondary | ICD-10-CM | POA: Insufficient documentation

## 2014-12-11 ENCOUNTER — Encounter: Payer: Medicaid Other | Admitting: *Deleted

## 2014-12-11 DIAGNOSIS — J449 Chronic obstructive pulmonary disease, unspecified: Secondary | ICD-10-CM | POA: Diagnosis present

## 2014-12-11 NOTE — Progress Notes (Signed)
Cardiac Individual Treatment Plan  Patient Details  Name: Nathaniel [name] MRN: [id number] Date of Birth: [birth year] Referring Provider:  Sharlyne Cai, NP  Initial Encounter Date:    Visit Diagnosis: No diagnosis found.  Patient's Home Medications on Admission:  Current outpatient prescriptions:  .  albuterol (PROVENTIL HFA;VENTOLIN HFA) 108 (90 BASE) MCG/ACT inhaler, Inhale 2 puffs into the lungs every 6 (six) hours as needed for wheezing or shortness of breath., Disp: , Rfl:  .  albuterol (PROVENTIL HFA;VENTOLIN HFA) 108 (90 BASE) MCG/ACT inhaler, Inhale into the lungs., Disp: , Rfl:  .  aspirin EC 81 MG tablet, Take 81 mg by mouth., Disp: , Rfl:  .  budesonide-formoterol (SYMBICORT) 160-4.5 MCG/ACT inhaler, Inhale into the lungs., Disp: , Rfl:  .  COMBIVENT RESPIMAT 20-100 MCG/ACT AERS respimat, Inhale 1 puff into the lungs daily., Disp: , Rfl: 11 .  enalapril (VASOTEC) 10 MG tablet, Take 1 tablet by mouth at bedtime., Disp: , Rfl: 11 .  Na Sulfate-K Sulfate-Mg Sulf (SUPREP BOWEL PREP) SOLN, Take 1 kit by mouth as directed., Disp: 1 Bottle, Rfl: 0 .  oxyCODONE-acetaminophen (PERCOCET/ROXICET) 5-325 MG per tablet, Take 1 tablet by mouth every 6 (six) hours as needed., Disp: , Rfl: 0 .  pravastatin (PRAVACHOL) 10 MG tablet, Take 10 mg by mouth daily., Disp: , Rfl:  .  sildenafil (VIAGRA) 100 MG tablet, Take 1 tablet by mouth as needed., Disp: , Rfl:  .  tiotropium (SPIRIVA HANDIHALER) 18 MCG inhalation capsule, Place into inhaler and inhale., Disp: , Rfl:   Past Medical History: Past Medical History  Diagnosis Date  . COPD (chronic obstructive pulmonary disease) ([place])   . Hypertension   . Chronic back pain     Dr. Quay Burow manages (per pt)  . MGUS (monoclonal gammopathy of unknown significance)   . Proteinuria   . Hearing loss   . Hyperlipidemia   . Tobacco abuse     Tobacco Use: History  Smoking status  . Current Some Day Smoker -- 2.00 packs/day for 45 years  . Types:  Cigarettes  Smokeless tobacco  . Never Used    Labs: Recent Review Flowsheet Data    There is no flowsheet data to display.       Exercise Target Goals:    Exercise Program Goal: Individual exercise prescription set with THRR, safety & activity barriers. Participant demonstrates ability to understand and report RPE using BORG scale, to self-measure pulse accurately, and to acknowledge the importance of the exercise prescription.  Exercise Prescription Goal: Starting with aerobic activity 30 plus minutes a day, 3 days per week for initial exercise prescription. Provide home exercise prescription and guidelines that participant acknowledges understanding prior to discharge.  Activity Barriers & Risk Stratification:     Activity Barriers & Risk Stratification - 10/17/14 1130    Activity Barriers & Risk Stratification   Activity Barriers Back Problems;Shortness of Breath;Deconditioning;Muscular Weakness   Risk Stratification Moderate      6 Minute Walk:     6 Minute Walk      10/19/14 1007 12/11/14 1053     6 Minute Walk   Phase Initial Mid Program    Distance 1340 feet 1500 feet    Walk Time 6 minutes 6 minutes    Resting HR 81 bpm 77 bpm    Resting BP 122/64 mmHg 128/72 mmHg    Max Ex. HR 91 bpm 101 bpm    Max Ex. BP 142/62 mmHg 138/82 mmHg    RPE  9 13    Perceived Dyspnea  2 3    Symptoms No No       Initial Exercise Prescription:     Initial Exercise Prescription - 10/19/14 1000    Date of Initial Exercise Prescription   Date 10/17/14   Treadmill   MPH 2.5   Grade 0   Minutes 10   Recumbant Bike   Level 2   RPM 40   Watts 20   Minutes 10   NuStep   Level 3   Watts 50   Minutes 10   Arm Ergometer   Level 1   Watts 10   Minutes 10   Recumbant Elliptical   Level 1   RPM 40   Watts 20   Minutes 10   Elliptical   Level 1   Speed 3   Minutes 1   REL-XR   Level 3   Watts 50   Minutes 10   Prescription Details   Frequency (times per  week) 3   Duration Progress to 30 minutes of continuous aerobic without signs/symptoms of physical distress   Intensity   THRR REST +  30   Ratings of Perceived Exertion 11-15   Perceived Dyspnea 2-4   Progression Continue progressive overload as per policy without signs/symptoms or physical distress.   Resistance Training   Training Prescription Yes   Weight 2   Reps 10-15      Exercise Prescription Changes:     Exercise Prescription Changes      10/20/14 1100 10/27/14 1000 10/30/14 1100 11/03/14 1100 11/06/14 1000   Exercise Review   Progression  Yes Yes Yes Yes   Response to Exercise   Blood Pressure (Admit) 122/70 mmHg       Blood Pressure (Exercise) 142/90 mmHg       Blood Pressure (Exit) 116/78 mmHg       Heart Rate (Admit) 78 bpm       Heart Rate (Exercise) 114 bpm       Heart Rate (Exit) 88 bpm       Oxygen Saturation (Admit) 97 %       Oxygen Saturation (Exercise) 96 %       Oxygen Saturation (Exit) 97 %       Rating of Perceived Exertion (Exercise) 13       Perceived Dyspnea (Exercise) 3       Symptoms None None None None None   Comments First day of exercise! Patient was oriented to the gym and the equipment functions and settings. Procedures and policies of the gym were outlined and explained. The patient's individual exercise prescription and treatment plan were reviewed with them. All starting workloads were established based on the results of the functional testing  done at the initial intake visit. The plan for exercise progression was also introduced and progression will be customized based on the patient's performance and goals.  Reviewed individualized exercise prescription and made increases per departmental policy. Exercise increases were discussed with the patient and they were able to perform the new work loads without issue (no signs or symptoms).   [name] says that he has not had a cigarette in 1 week. Reviewed individualized exercise prescription and made  increases per departmental policy. Exercise increases were discussed with the patient and they were able to perform the new work loads without issue (no signs or symptoms).  Reviewed individualized exercise prescription and made increases per departmental policy. Exercise increases were discussed with the patient  and they were able to perform the new work loads without issue (no signs or symptoms).  Jaxten was given increases in intensity on the TM, NS, and RB today. Reviewed individualized exercise prescription and made increases per departmental policy. Exercise increases were discussed with the patient and they were able to perform the new work loads without issue (no signs or symptoms).    Duration Progress to 30 minutes of continuous aerobic without signs/symptoms of physical distress Progress to 30 minutes of continuous aerobic without signs/symptoms of physical distress Progress to 30 minutes of continuous aerobic without signs/symptoms of physical distress Progress to 30 minutes of continuous aerobic without signs/symptoms of physical distress Progress to 30 minutes of continuous aerobic without signs/symptoms of physical distress   Intensity Rest + 30 Rest + 30 Rest + 30 Rest + 30 Rest + 30   Progression Continue progressive overload as per policy without signs/symptoms or physical distress. Continue progressive overload as per policy without signs/symptoms or physical distress. Continue progressive overload as per policy without signs/symptoms or physical distress. Continue progressive overload as per policy without signs/symptoms or physical distress. Continue progressive overload as per policy without signs/symptoms or physical distress.   Resistance Training   Training Prescription Yes Yes Yes Yes Yes   Weight 2 2 2 4 4    Reps 10-12 10-12 10-12 10-12 10-12   Interval Training   Interval Training No No No No No   Treadmill   MPH 2.6 2.7 2.7 2.7 3   Grade 0 0 0 0 0   Minutes 10 12 15 15 15     Recumbant Bike   Level 3.5 3.5 3.5 3.5 4   RPM 50 50 50 50 60   Minutes 15 15 15 15 15    NuStep   Level 3 3 3 3 4    Watts 50 50 50 50 50   Minutes 20 20 20 20 20      11/08/14 1000 11/13/14 1400 11/15/14 1000 11/17/14 1100 11/22/14 1000   Exercise Review   Progression Yes Yes Yes Yes Yes   Response to Exercise   Blood Pressure (Admit)  120/64 mmHg      Blood Pressure (Exercise)  160/80 mmHg      Blood Pressure (Exit)  122/66 mmHg      Heart Rate (Admit)  85 bpm      Heart Rate (Exercise)  129 bpm      Heart Rate (Exit)  101 bpm      Oxygen Saturation (Admit)  96 %      Oxygen Saturation (Exercise)  93 %      Oxygen Saturation (Exit)  94 %      Rating of Perceived Exertion (Exercise)  14      Perceived Dyspnea (Exercise)  4      Symptoms None None   None   Comments Que was given increases in intensity on the TM, NS, and RB today. Reviewed individualized exercise prescription and made increases per departmental policy. Exercise increases were discussed with the patient and they were able to perform the new work loads without issue (no signs or symptoms).  Crockett was given increases in intensity on the TM, NS, and RB today. Reviewed individualized exercise prescription and made increases per departmental policy. Exercise increases were discussed with the patient and they were able to perform the new work loads without issue (no signs or symptoms).    Reviewed individualized exercise prescription and made increases per departmental policy. Exercise increases were  discussed with the patient and they were able to perform the new work loads without issue (no signs or symptoms).    Duration Progress to 30 minutes of continuous aerobic without signs/symptoms of physical distress Progress to 30 minutes of continuous aerobic without signs/symptoms of physical distress Progress to 30 minutes of continuous aerobic without signs/symptoms of physical distress Progress to 30 minutes of continuous aerobic  without signs/symptoms of physical distress Progress to 30 minutes of continuous aerobic without signs/symptoms of physical distress   Intensity Rest + 30 Rest + 30 Rest + 30 Rest + 30 Rest + 30   Progression Continue progressive overload as per policy without signs/symptoms or physical distress. Continue progressive overload as per policy without signs/symptoms or physical distress. Continue progressive overload as per policy without signs/symptoms or physical distress. Continue progressive overload as per policy without signs/symptoms or physical distress. Continue progressive overload as per policy without signs/symptoms or physical distress.   Resistance Training   Training Prescription Yes Yes Yes Yes Yes   Weight 4 5 5 5 5    Reps 10-12 10-12 10-12 10-12 10-12   Interval Training   Interval Training No No No No No   Treadmill   MPH 3 3.3 3.3 3.3 3.3   Grade 1 1 1 1 1    Minutes 15 15 15 15 15    Recumbant Bike   Level 4 5 5 5 5    RPM 60 60 60 60 60   Minutes 15 15 15 15 15    NuStep   Level 5 5 5 6 6    Watts 80 80 80 80 70  80W was for the older style NS, [place] is now on newer style   Minutes 20 20 20 20 20      12/04/14 1636 12/11/14 1100         Exercise Review   Progression Yes Yes      Response to Exercise   Blood Pressure (Admit) 124/70 mmHg 128/72 mmHg      Blood Pressure (Exercise) 150/70 mmHg 138/82 mmHg      Blood Pressure (Exit) 122/80 mmHg       Heart Rate (Admit) 86 bpm 77 bpm      Heart Rate (Exercise) 111 bpm 108 bpm      Heart Rate (Exit) 103 bpm       Oxygen Saturation (Admit) 96 % 97 %      Oxygen Saturation (Exercise) 95 % 94 %      Oxygen Saturation (Exit) 94 %       Rating of Perceived Exertion (Exercise) 13 15      Perceived Dyspnea (Exercise) 4 4      Symptoms None none      Duration Progress to 30 minutes of continuous aerobic without signs/symptoms of physical distress Progress to 50 minutes of aerobic without signs/symptoms of physical distress       Intensity Rest + 30 THRR unchanged      Progression Continue progressive overload as per policy without signs/symptoms or physical distress. Continue progressive overload as per policy without signs/symptoms or physical distress.      Resistance Training   Training Prescription Yes Yes      Weight 5 5      Reps 10-12 10-15      Interval Training   Interval Training No No      Treadmill   MPH 3.3 3.3      Grade 1 1      Minutes 15 15  Recumbant Bike   Level 5 5      RPM 60 60      Minutes 15 15      NuStep   Level 6 7      Watts 70  80W was for the older style NS, Kavian is now on newer style 92      Minutes 20 15         Discharge Exercise Prescription (Final Exercise Prescription Changes):     Exercise Prescription Changes - 12/11/14 1100    Exercise Review   Progression Yes   Response to Exercise   Blood Pressure (Admit) 128/72 mmHg   Blood Pressure (Exercise) 138/82 mmHg   Heart Rate (Admit) 77 bpm   Heart Rate (Exercise) 108 bpm   Oxygen Saturation (Admit) 97 %   Oxygen Saturation (Exercise) 94 %   Rating of Perceived Exertion (Exercise) 15   Perceived Dyspnea (Exercise) 4   Symptoms none   Duration Progress to 50 minutes of aerobic without signs/symptoms of physical distress   Intensity THRR unchanged   Progression Continue progressive overload as per policy without signs/symptoms or physical distress.   Resistance Training   Training Prescription Yes   Weight 5   Reps 10-15   Interval Training   Interval Training No   Treadmill   MPH 3.3   Grade 1   Minutes 15   Recumbant Bike   Level 5   RPM 60   Minutes 15   NuStep   Level 7   Watts 92   Minutes 15      Nutrition:  Target Goals: Understanding of nutrition guidelines, daily intake of sodium <1578m, cholesterol <2043m calories 30% from fat and 7% or less from saturated fats, daily to have 5 or more servings of fruits and vegetables.  Biometrics:     Pre Biometrics - 10/19/14 1012    Pre  Biometrics   Height 6' 1"  (1.854 m)   Weight 236 lb 8 oz (107.276 kg)   Waist Circumference 43.5 inches   Hip Circumference 44 inches   Waist to Hip Ratio 0.99 %   BMI (Calculated) 31.3       Nutrition Therapy Plan and Nutrition Goals:     Nutrition Therapy & Goals - 11/20/14 1526    Nutrition Therapy   Diet Instructed patient on a 2000 calorie meal plant to promote weight loss including general dietary guidelines for lung health and DASH diet principles   Fiber 20 grams   Whole Grain Foods 3 servings   Protein 8 ounces/day   Saturated Fats 13 max. grams   Fruits and Vegetables 5 servings/day   Personal Nutrition Goals   Personal Goal #1 To work on a more consistent pattern of eating with 3 meals per day spaced 4-6 hours apart. Include only 1 evening snack vs. grazing.   Personal Goal #2 Decrease sweetened tea from 1/2 gallon per day to 1-2 (10 oz) glasses at meals.   Personal Goal #3 Try 1% milk and limit milk products to 3 per day including yogurt and cheese (no more than once daily).   Personal Goal #4 Read food labels for sodium with goal of no more than 60062modium per meal.   Additional Goals? Yes   Personal Goal #5 Add steamed vegetables and fruit to "take out" sandwich meals vs. fries or chips.   Personal Goal #6 Increase water intake. Keep bottled water with you.      Nutrition Discharge: Rate Your Plate  Scores:     Rate Your Plate - 18/29/93 7169    Rate Your Plate Scores   Pre Score 55   Pre Score % 61 %      Nutrition Goals Re-Evaluation:   Psychosocial: Target Goals: Acknowledge presence or absence of depression, maximize coping skills, provide positive support system. Participant is able to verbalize types and ability to use techniques and skills needed for reducing stress and depression.  Initial Review & Psychosocial Screening:     Initial Psych Review & Screening - 10/17/14 1130    Family Dynamics   Good Support System? Yes   Comments Mr Zeitler  has good support from his 3 children and wife. He is managing his COPD well with pacing through activities. 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   Barriers   Psychosocial barriers to participate in program There are no identifiable barriers or psychosocial needs.;The patient should benefit from training in stress management and relaxation.   Screening Interventions   Interventions Encouraged to exercise      Quality of Life Scores:     Quality of Life - 10/17/14 1130    Quality of Life Scores   Health/Function Pre 17.23 %   Socioeconomic Pre 17 %   Psych/Spiritual Pre 22.55 %   Family Pre 22.8 %   GLOBAL Pre 18.98 %      PHQ-9:     Recent Review Flowsheet Data    Depression screen PHQ 2/9 10/17/2014   Decreased Interest 1   Down, Depressed, Hopeless 0   PHQ - 2 Score 1      Psychosocial Evaluation and Intervention:     Psychosocial Evaluation - 11/06/14 1053    Psychosocial Evaluation & Interventions   Interventions Stress management education;Relaxation education;Encouraged to exercise with the program and follow exercise prescription   Comments Counselor met with Mr. Orner today for initial psychosocial evaluation.  He is a 59 year old who has COPD and multiple health issues, including kidney disease, degenerative disk problems in his lower back and a blood cancer disease that is currently in remission.  Mr. Travelstead has a strong support system with a significant other of 5 years and an adult son who lives close by.  He reports he was sleeping too much and eating too much until recently when he quit smoking, began new medication and working out, so these things have improved.  Mr. Mclaren denies a history of depression or anxiety or current symptoms.  He admitted to being very "grouchy" after he quit smoking several weeks ago, but that has improved with time.  He states he is typically in a positive mood most of  the time.  Mr. Cregg reports he has some stress in his life as he is around people who smoke frequently and this is counterproductive to his goal to quit.  Counselor worked with him on "triggers" and ways to change his lifestyle in order to promote success in this area.  His primary goal is to dance an entire song without stopping by this end of this pulmonary program.  Mr. Kynard will benefit from meeting with the dietician to accomplish his weight loss and healthier eating goals.  Counselor will follow up with Mr. Pelzel on these goals.   Continued Psychosocial Services Needed Yes  Mr. Henney will benefit from the psychoeducational components of this program in order to help him relax and handle the stress of quitting smoking  better.  He will also benefit from seeing the dietician for weight loss goals.        Psychosocial Re-Evaluation:     Psychosocial Re-Evaluation      11/27/14 1058           Psychosocial Re-Evaluation   Comments Follow up with Mr. Hamm today reporting struggling more to breathe now that the weather is colder.  He states he was able to dance recently most of a song, although he definitely had to pace himself more than in the past.  This is an improvement and is one of his goals for this program.  Counselor commended Mr. Couey on this.  He admits to continuing to struggle with smoking cessation and this has "set him back" a bit from accomplishing his goals.  Discussed his plan to get back on track as well as his nutrition plan following meeting with the dietician.  Mr. Savidge is aware that stress is a "trigger" for him and has alternative ways to cope without smoking.  Counselor will continue to follow with him.            Vocational Rehabilitation: Provide vocational rehab assistance to qualifying candidates.   Vocational Rehab Evaluation & Intervention:   Education: Education Goals: Education classes will be provided on a weekly basis, covering required topics.  Participant will state understanding/return demonstration of topics presented.  Learning Barriers/Preferences:     Learning Barriers/Preferences - 10/17/14 1130    Learning Barriers/Preferences   Learning Barriers None   Learning Preferences Group Instruction;Individual Instruction;Pictoral;Skilled Demonstration;Verbal Instruction;Video;Written Material      Education Topics: General Nutrition Guidelines/Fats and Fiber: -Group instruction provided by verbal, written material, models and posters to present the general guidelines for heart healthy nutrition. Gives an explanation and review of dietary fats and fiber.   Controlling Sodium/Reading Food Labels: -Group verbal and written material supporting the discussion of sodium use in heart healthy nutrition. Review and explanation with models, verbal and written materials for utilization of the food label.          Pulmonary Rehab from 12/06/2014 in [place]   Date  11/17/14   Educator  Candiss Norse    Instruction Review Code  2- meets goals/outcomes      Exercise Physiology & Risk Factors: - Group verbal and written instruction with models to review the exercise physiology of the cardiovascular system and associated critical values. Details cardiovascular disease risk factors and the goals associated with each risk factor.   Aerobic Exercise & Resistance Training: - Gives group verbal and written discussion on the health impact of inactivity. On the components of aerobic and resistive training programs and the benefits of this training and how to safely progress through these programs.   Flexibility, Balance, General Exercise Guidelines: - Provides group verbal and written instruction on the benefits of flexibility and balance training programs. Provides general exercise guidelines with specific guidelines to those with heart or lung disease. Demonstration and skill practice provided.       Pulmonary Rehab from 12/06/2014 in [place]   Date  11/15/14   Educator  SW   Instruction Review Code  2- meets goals/outcomes      Stress Management: - Provides group verbal and written instruction about the health risks of elevated stress, cause of high stress, and healthy ways to reduce stress.      Pulmonary Rehab from 12/06/2014 in [place]  Date  11/08/14   Educator  [place]   Instruction Review Code  2- meets goals/outcomes      Depression: - Provides group verbal and written instruction on the correlation between heart/lung disease and depressed mood, treatment options, and the stigmas associated with seeking treatment.   Anatomy & Physiology of the Heart: - Group verbal and written instruction and models provide basic cardiac anatomy and physiology, with the coronary electrical and arterial systems. Review of: AMI, Angina, Valve disease, Heart Failure, Cardiac Arrhythmia, Pacemakers, and the ICD.   Cardiac Procedures: - Group verbal and written instruction and models to describe the testing methods done to diagnose heart disease. Reviews the outcomes of the test results. Describes the treatment choices: Medical Management, Angioplasty, or Coronary Bypass Surgery.   Cardiac Medications: - Group verbal and written instruction to review commonly prescribed medications for heart disease. Reviews the medication, class of the drug, and side effects. Includes the steps to properly store meds and maintain the prescription regimen.      Pulmonary Rehab from 12/06/2014 in [place]   Date  11/03/14   Educator  CE   Instruction Review Code  2- meets goals/outcomes      Go Sex-Intimacy & Heart Disease, Get SMART - Goal Setting: - Group verbal and written instruction through game format to discuss heart disease and the return to sexual intimacy. Provides group verbal  and written material to discuss and apply goal setting through the application of the S.M.A.R.T. Method.   Other Matters of the Heart: - Provides group verbal, written materials and models to describe Heart Failure, Angina, Valve Disease, and Diabetes in the realm of heart disease. Includes description of the disease process and treatment options available to the cardiac patient.   Exercise & Equipment Safety: - Individual verbal instruction and demonstration of equipment use and safety with use of the equipment.      Pulmonary Rehab from 12/06/2014 in [place]   Date  10/20/14   Educator  [place]   Instruction Review Code  2- meets goals/outcomes      Infection Prevention: - Provides verbal and written material to individual with discussion of infection control including proper hand washing and proper equipment cleaning during exercise session.      Pulmonary Rehab from 12/06/2014 in [place]   Date  10/20/14   Educator  [place]   Instruction Review Code  2- meets goals/outcomes      Falls Prevention: - Provides verbal and written material to individual with discussion of falls prevention and safety.      Pulmonary Rehab from 12/06/2014 in [place]   Date  10/17/14   Educator  LB   Instruction Review Code  2- meets goals/outcomes      Diabetes: - Individual verbal and written instruction to review signs/symptoms of diabetes, desired ranges of glucose level fasting, after meals and with exercise. Advice that pre and post exercise glucose checks will be done for 3 sessions at entry of program.    Knowledge Questionnaire Score:     Knowledge Questionnaire Score - 10/17/14 1130    Knowledge Questionnaire Score   Pre Score -5      Personal Goals and Risk Factors at Admission:     Personal Goals and Risk Factors at Admission - 11/03/14 1058    Personal Goals and  Risk Factors on Admission   Lipids Yes  On  meds for cholesterol control.   Goal Cholesterol controlled with medications as prescribed, with individualized exercise RX and with personalized nutrition plan. Value goals: LDL < 74m, HDL > 445m Participant states understanding of desired cholesterol values and following prescriptions.   Intervention Provide nutrition & aerobic exercise along with prescribed medications to achieve LDL <7032mHDL >63m44m    Personal Goals and Risk Factors Review:      Goals and Risk Factor Review      10/27/14 1417 10/30/14 1000 11/03/14 1054 11/03/14 1059 11/03/14 1111   Weight Management   Goals Progress/Improvement seen   No     Comments   No weight change noted yet  5 sessions attended. To make appointment with RD. HAs cut down on milk consumption,and is working on prtion control. Still need uidance on calories/fat/portion control. Has started working on changing cereals from "sugar Crack" to whole grain cereals. HAs decreased sweets intake too.     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      Quit Smoking   Goals Progress/Improvement seen Yes Yes      Comments Mr. SmithMazernot had a cigarette in 1 week. Mr SmithHaymerts he smokes occasionally and with stress. His trip was for legal matters and was very stressful, but he did not smoke there and states he has not had a cigarrette since his start date in [place], 1Bacon11/2016.      Breathing Techniques   Goals Progress/Improvement seen   Yes   --  Jw Calixtobreathing tests done this past Wednesday at the referring MD office. He was told his numbers are improved.   Comments  Mr SmithCiciosing PLB with his exercise goals and activites at home and states that it is helpful for  shortness of breath.      Hypertension   Goal   --  Eber Vedant not have HTN listed as a risk factor.     Abnormal Lipids   Progress seen towards goals    Unknown    Comments    On meds for cholesterol control. Is working on eating habits,waiting to see the RD for advice.      11/06/14 1501 11/08/14 1000 11/17/14 1240 11/17/14 1454 11/20/14 1000   Weight Management   Goals Progress/Improvement seen   No     Comments   No weight changes since last spoke with Verne.Eddie Dibbleshas made  a few nutriotin changes. Added yogurt to his meals, continues to decrease milk intake. Added iced tea, then decided the sugar in the tea might not be a great change. Jamarkis Madelines that he grazes after dinner and ends up snacking all evening while watching TV. HE has committed to spending 2 nights a week working on a project in his shop to curb his Grazing" after dinner.  He has set a goal to lose 2 pounds in the next two weeks.     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 This is when it is difficult to not smoke when around other people are smoking.  Understand more about Heart/Pulmonary Disease   Goals Progress/Improvement seen  Yes    Yes   Comments Educated Mr Simpson on his PFT and related it to his COPD. Educated him on the goal to maintain his Spirometry results, prevent them from worsening, as well as his COPD, by exercising, smoking cessation, using his MDI's, good diet, and wearing oxygen if needed.    In the past, Mr Moya modified his activity to adjust for his shortness of breath and COPD. He loves to [place] dance and made a song that was only 57mn long. Last Friday, he was able to shang dance with his wife for 3-4 mins which was a big accomplishment and very important to him.   Improve shortness of breath with ADL's   Goals Progress/Improvement seen   Yes  Yes    Comments  Mr SDelpizzowas having a "bad  breathing day". He had mowed the other day and inhaled dusty air. We talked about hime wearing a mask with yardwork, nasal washing after the work, and using his inhaler for rescue.  Mr. SPfeiflesays he is not using as much Proventil since he has been in rehab.  He is not running out before the month's end.  He is also doing more. He is helping his son remodel an old home.    Breathing Techniques   Goals Progress/Improvement seen     Yes    Comments    Mr. SNietois using proper technique for PLB while on the TM.    Increase knowledge of respiratory medications   Goals Progress/Improvement seen   Yes  Yes    Comments  Mr SFallettahas a good understanding of his MDI's. We discussed using his Albuterol MDI before exercise in [facility] and the importance of Spiriva with exercise.  Mr. SLedwellis taking Spiriva, Symbicort and Proventil MDIs.  He is rinsing his mouth after taking Spiriva and Symbicort.    Hypertension   Goal   Participant will see blood pressure controlled within the values of 140/950mHg or within value directed by their physician.     Progress seen toward goals   Yes     Comments   PABuckytates he is not on any medications presently for BP control. His documented BP readings are in normal range.      Abnormal Lipids   Progress seen towards goals   Unknown     Comments   HAs an appt with the RD next week. PADagmawiontinues to work on chGames developerabits to better choices. Added yogurt to his meals since last time I spoke with him.        11/27/14 1111 11/29/14 1538 12/04/14 1051 12/04/14 1611 12/06/14 1102   Weight Management   Goals Progress/Improvement seen  Yes      Comments  Fitzroy has added some project time to his evenings. He helped on a project that exposed him to "asbestos" dust. He has been sick since this exposure and he was advised to call his MD if he continues to be sick. PaFarrisas removed all the junk food from his house since we last spoke and has added more fruit and vegetables  to his daily meals. He is down over 3 pounds since making the changes.       Increase Aerobic Exercise and Physical Activity   Goals Progress/Improvement seen  Yes  Yes  Yes   Comments PaAldensed all his inhalers the other day so SuFoster SimpsonRN instructed  him to call his MD. Today he reported he has limited his cigarette smoking except Thursday he smoked 14 cigarettes. He has also been around asbestors.   Patient reported that walking from his car to the pulmonary rehab gym is now much easier than it was on the first day he came to rehab. He stated that his exercise prescription is challenging, but on a good breathing day he does increase his workload slightly to continually challenge himself.   Divine said that the treadmill is the hardest machine for him, but he has noticed his hard work on the treadmill paying off as he can walk easier and faster without illiciting symptoms. He is very encouraged by this progress and attributes it to his consistent attendance in [facility].   Quit Smoking   Goals Progress/Improvement seen    Yes    Comments    Mr Nicklaus has not smoked since 11/27/2014, even over the Holiday's. He is very determined not to smoke and can feel a difference with his breathing since he has quit.    Improve shortness of breath with ADL's   Goals Progress/Improvement seen  No       Comments Devian used all his inhalers the other day so [name], RN instructed him to call his MD. Today he reported he has limited his cigarette smoking except Thursday he smoked 14 cigarettes. He has also been around asbestors.           Personal Goals Discharge (Final Personal Goals and Risk Factors Review):      Goals and Risk Factor Review - 12/06/14 1102    Increase Aerobic Exercise and Physical Activity   Goals Progress/Improvement seen  Yes   Comments Shivansh said that the treadmill is the hardest machine for him, but he has noticed his hard work on the treadmill paying off as he can walk easier and faster  without illiciting symptoms. He is very encouraged by this progress and attributes it to his consistent attendance in [facility].      ITP Comments:   Comments:

## 2014-12-11 NOTE — Progress Notes (Signed)
Daily Session Note  Patient Details  Name: Nathaniel [name] MRN: [id number] Date of Birth: [birth year] Referring Provider:  Sharlyne Cai, NP  Encounter Date: 12/11/2014  Check In:     Session Check In - 12/11/14 1105    Check-In   Staff Present Carson Myrtle, BS, RRT, Respiratory Bertis Ruddy, BS, ACSM CEP, Exercise Physiologist;Steven Way, BS, ACSM EP-C, Exercise Physiologist   ER physicians immediately available to respond to emergencies [facility] immediately available ER MD   Physician(s) Jimmye Norman and Corky Downs   Medication changes reported     No   Fall or balance concerns reported    No   Warm-up and Cool-down Performed on first and last piece of equipment   VAD Patient? No   Pain Assessment   Currently in Pain? No/denies   Multiple Pain Sites No           Exercise Prescription Changes - 12/11/14 1100    Exercise Review   Progression Yes   Response to Exercise   Blood Pressure (Admit) 128/72 mmHg   Blood Pressure (Exercise) 138/82 mmHg   Heart Rate (Admit) 77 bpm   Heart Rate (Exercise) 108 bpm   Oxygen Saturation (Admit) 97 %   Oxygen Saturation (Exercise) 94 %   Rating of Perceived Exertion (Exercise) 15   Perceived Dyspnea (Exercise) 4   Symptoms none   Duration Progress to 50 minutes of aerobic without signs/symptoms of physical distress   Intensity THRR unchanged   Progression Continue progressive overload as per policy without signs/symptoms or physical distress.   Resistance Training   Training Prescription Yes   Weight 5   Reps 10-15   Interval Training   Interval Training No   Treadmill   MPH 3.3   Grade 1   Minutes 15   Recumbant Bike   Level 5   RPM 60   Minutes 15   NuStep   Level 7   Watts 92   Minutes 15      Goals Met:  Proper associated with RPD/PD & O2 Sat Independence with exercise equipment Exercise tolerated well Personal goals reviewed Strength training completed today  Goals Unmet:  Not Applicable  Goals  Comments: Patient completed exercise prescription and all exercise goals during rehab session. The exercise was tolerated well and the patient is progressing in the program.     Dr. Emily Filbert is Medical Director for [facility] and [facility].

## 2014-12-11 NOTE — Progress Notes (Signed)
Pulmonary Individual Treatment Plan  Patient Details  Name: Nathaniel [name] MRN: [id number] Date of Birth: 1955-07-12 Referring Provider:  Dr. Ines Bloomer  Initial Encounter Date:  10/17/14  Visit Diagnosis: COPD, moderate ([place])  Patient's Home Medications on Admission:  Current outpatient prescriptions:  .  albuterol (PROVENTIL HFA;VENTOLIN HFA) 108 (90 BASE) MCG/ACT inhaler, Inhale 2 puffs into the lungs every 6 (six) hours as needed for wheezing or shortness of breath., Disp: , Rfl:  .  albuterol (PROVENTIL HFA;VENTOLIN HFA) 108 (90 BASE) MCG/ACT inhaler, Inhale into the lungs., Disp: , Rfl:  .  aspirin EC 81 MG tablet, Take 81 mg by mouth., Disp: , Rfl:  .  budesonide-formoterol (SYMBICORT) 160-4.5 MCG/ACT inhaler, Inhale into the lungs., Disp: , Rfl:  .  COMBIVENT RESPIMAT 20-100 MCG/ACT AERS respimat, Inhale 1 puff into the lungs daily., Disp: , Rfl: 11 .  enalapril (VASOTEC) 10 MG tablet, Take 1 tablet by mouth at bedtime., Disp: , Rfl: 11 .  Na Sulfate-K Sulfate-Mg Sulf (SUPREP BOWEL PREP) SOLN, Take 1 kit by mouth as directed., Disp: 1 Bottle, Rfl: 0 .  oxyCODONE-acetaminophen (PERCOCET/ROXICET) 5-325 MG per tablet, Take 1 tablet by mouth every 6 (six) hours as needed., Disp: , Rfl: 0 .  pravastatin (PRAVACHOL) 10 MG tablet, Take 10 mg by mouth daily., Disp: , Rfl:  .  sildenafil (VIAGRA) 100 MG tablet, Take 1 tablet by mouth as needed., Disp: , Rfl:  .  tiotropium (SPIRIVA HANDIHALER) 18 MCG inhalation capsule, Place into inhaler and inhale., Disp: , Rfl:   Past Medical History: Past Medical History  Diagnosis Date  . COPD (chronic obstructive pulmonary disease) ([place])   . Hypertension   . Chronic back pain     Dr. Quay Burow manages (per pt)  . MGUS (monoclonal gammopathy of unknown significance)   . Proteinuria   . Hearing loss   . Hyperlipidemia   . Tobacco abuse     Tobacco Use: History  Smoking status  . Current Some Day Smoker -- 2.00 packs/day for 45 years  .  Types: Cigarettes  Smokeless tobacco  . Never Used    Labs: Recent Review Flowsheet Data    There is no flowsheet data to display.       ADL UCSD:     ADL UCSD      10/17/14 1130       ADL UCSD   ADL Phase Entry     SOB Score total 48     Rest 1     Walk 3     Stairs 4     Bath 2     Dress 0     Shop 0         Pulmonary Function Assessment:     Pulmonary Function Assessment - 10/17/14 1130    Pulmonary Function Tests   RV% 270 %   DLCO% 67 %   Initial Spirometry Results   FVC% 45.9 %   FEV1% 23 %   FEV1/FVC Ratio 40   Post Bronchodilator Spirometry Results   FVC% 45.1 %   FEV1% 34 %   FEV1/FVC Ratio 40      Exercise Target Goals:    Exercise Program Goal: Individual exercise prescription set with THRR, safety & activity barriers. Participant demonstrates ability to understand and report RPE using BORG scale, to self-measure pulse accurately, and to acknowledge the importance of the exercise prescription.  Exercise Prescription Goal: Starting with aerobic activity 30 plus minutes a day, 3 days per  week for initial exercise prescription. Provide home exercise prescription and guidelines that participant acknowledges understanding prior to discharge.  Activity Barriers & Risk Stratification:     Activity Barriers & Risk Stratification - 10/17/14 1130    Activity Barriers & Risk Stratification   Activity Barriers Back Problems;Shortness of Breath;Deconditioning;Muscular Weakness   Risk Stratification Moderate      6 Minute Walk:     6 Minute Walk      10/19/14 1007 12/11/14 1053     6 Minute Walk   Phase Initial Mid Program    Distance 1340 feet 1500 feet    Walk Time 6 minutes 6 minutes    Resting HR 81 bpm 77 bpm    Resting BP 122/64 mmHg 128/72 mmHg    Max Ex. HR 91 bpm 101 bpm    Max Ex. BP 142/62 mmHg 138/82 mmHg    RPE 9 13    Perceived Dyspnea  2 3    Symptoms No No       Initial Exercise Prescription:     Initial Exercise  Prescription - 10/19/14 1000    Date of Initial Exercise Prescription   Date 10/17/14   Treadmill   MPH 2.5   Grade 0   Minutes 10   Recumbant Bike   Level 2   RPM 40   Watts 20   Minutes 10   NuStep   Level 3   Watts 50   Minutes 10   Arm Ergometer   Level 1   Watts 10   Minutes 10   Recumbant Elliptical   Level 1   RPM 40   Watts 20   Minutes 10   Elliptical   Level 1   Speed 3   Minutes 1   REL-XR   Level 3   Watts 50   Minutes 10   Prescription Details   Frequency (times per week) 3   Duration Progress to 30 minutes of continuous aerobic without signs/symptoms of physical distress   Intensity   THRR REST +  30   Ratings of Perceived Exertion 11-15   Perceived Dyspnea 2-4   Progression Continue progressive overload as per policy without signs/symptoms or physical distress.   Resistance Training   Training Prescription Yes   Weight 2   Reps 10-15      Exercise Prescription Changes:     Exercise Prescription Changes      10/20/14 1100 10/27/14 1000 10/30/14 1100 11/03/14 1100 11/06/14 1000   Exercise Review   Progression  Yes Yes Yes Yes   Response to Exercise   Blood Pressure (Admit) 122/70 mmHg       Blood Pressure (Exercise) 142/90 mmHg       Blood Pressure (Exit) 116/78 mmHg       Heart Rate (Admit) 78 bpm       Heart Rate (Exercise) 114 bpm       Heart Rate (Exit) 88 bpm       Oxygen Saturation (Admit) 97 %       Oxygen Saturation (Exercise) 96 %       Oxygen Saturation (Exit) 97 %       Rating of Perceived Exertion (Exercise) 13       Perceived Dyspnea (Exercise) 3       Symptoms None None None None None   Comments First day of exercise! Patient was oriented to the gym and the equipment functions and settings. Procedures and policies of the gym were outlined  and explained. The patient's individual exercise prescription and treatment plan were reviewed with them. All starting workloads were established based on the results of the functional  testing  done at the initial intake visit. The plan for exercise progression was also introduced and progression will be customized based on the patient's performance and goals.  Reviewed individualized exercise prescription and made increases per departmental policy. Exercise increases were discussed with the patient and they were able to perform the new work loads without issue (no signs or symptoms).   Zykee says that he has not had a cigarette in 1 week. Reviewed individualized exercise prescription and made increases per departmental policy. Exercise increases were discussed with the patient and they were able to perform the new work loads without issue (no signs or symptoms).  Reviewed individualized exercise prescription and made increases per departmental policy. Exercise increases were discussed with the patient and they were able to perform the new work loads without issue (no signs or symptoms).  Velton was given increases in intensity on the TM, NS, and RB today. Reviewed individualized exercise prescription and made increases per departmental policy. Exercise increases were discussed with the patient and they were able to perform the new work loads without issue (no signs or symptoms).    Duration Progress to 30 minutes of continuous aerobic without signs/symptoms of physical distress Progress to 30 minutes of continuous aerobic without signs/symptoms of physical distress Progress to 30 minutes of continuous aerobic without signs/symptoms of physical distress Progress to 30 minutes of continuous aerobic without signs/symptoms of physical distress Progress to 30 minutes of continuous aerobic without signs/symptoms of physical distress   Intensity Rest + 30 Rest + 30 Rest + 30 Rest + 30 Rest + 30   Progression Continue progressive overload as per policy without signs/symptoms or physical distress. Continue progressive overload as per policy without signs/symptoms or physical distress. Continue  progressive overload as per policy without signs/symptoms or physical distress. Continue progressive overload as per policy without signs/symptoms or physical distress. Continue progressive overload as per policy without signs/symptoms or physical distress.   Resistance Training   Training Prescription Yes Yes Yes Yes Yes   Weight 2 2 2 4 4    Reps 10-12 10-12 10-12 10-12 10-12   Interval Training   Interval Training No No No No No   Treadmill   MPH 2.6 2.7 2.7 2.7 3   Grade 0 0 0 0 0   Minutes 10 12 15 15 15    Recumbant Bike   Level 3.5 3.5 3.5 3.5 4   RPM 50 50 50 50 60   Minutes 15 15 15 15 15    NuStep   Level 3 3 3 3 4    Watts 50 50 50 50 50   Minutes 20 20 20 20 20      11/08/14 1000 11/13/14 1400 11/15/14 1000 11/17/14 1100 11/22/14 1000   Exercise Review   Progression Yes Yes Yes Yes Yes   Response to Exercise   Blood Pressure (Admit)  120/64 mmHg      Blood Pressure (Exercise)  160/80 mmHg      Blood Pressure (Exit)  122/66 mmHg      Heart Rate (Admit)  85 bpm      Heart Rate (Exercise)  129 bpm      Heart Rate (Exit)  101 bpm      Oxygen Saturation (Admit)  96 %      Oxygen Saturation (Exercise)  93 %  Oxygen Saturation (Exit)  94 %      Rating of Perceived Exertion (Exercise)  14      Perceived Dyspnea (Exercise)  4      Symptoms None None   None   Comments Jaques was given increases in intensity on the TM, NS, and RB today. Reviewed individualized exercise prescription and made increases per departmental policy. Exercise increases were discussed with the patient and they were able to perform the new work loads without issue (no signs or symptoms).  Fortunato was given increases in intensity on the TM, NS, and RB today. Reviewed individualized exercise prescription and made increases per departmental policy. Exercise increases were discussed with the patient and they were able to perform the new work loads without issue (no signs or symptoms).    Reviewed individualized  exercise prescription and made increases per departmental policy. Exercise increases were discussed with the patient and they were able to perform the new work loads without issue (no signs or symptoms).    Duration Progress to 30 minutes of continuous aerobic without signs/symptoms of physical distress Progress to 30 minutes of continuous aerobic without signs/symptoms of physical distress Progress to 30 minutes of continuous aerobic without signs/symptoms of physical distress Progress to 30 minutes of continuous aerobic without signs/symptoms of physical distress Progress to 30 minutes of continuous aerobic without signs/symptoms of physical distress   Intensity Rest + 30 Rest + 30 Rest + 30 Rest + 30 Rest + 30   Progression Continue progressive overload as per policy without signs/symptoms or physical distress. Continue progressive overload as per policy without signs/symptoms or physical distress. Continue progressive overload as per policy without signs/symptoms or physical distress. Continue progressive overload as per policy without signs/symptoms or physical distress. Continue progressive overload as per policy without signs/symptoms or physical distress.   Resistance Training   Training Prescription Yes Yes Yes Yes Yes   Weight 4 5 5 5 5    Reps 10-12 10-12 10-12 10-12 10-12   Interval Training   Interval Training No No No No No   Treadmill   MPH 3 3.3 3.3 3.3 3.3   Grade 1 1 1 1 1    Minutes 15 15 15 15 15    Recumbant Bike   Level 4 5 5 5 5    RPM 60 60 60 60 60   Minutes 15 15 15 15 15    NuStep   Level 5 5 5 6 6    Watts 80 80 80 80 70  80W was for the older style NS, Kerney is now on newer style   Minutes 20 20 20 20 20      12/04/14 1636 12/11/14 1100         Exercise Review   Progression Yes Yes      Response to Exercise   Blood Pressure (Admit) 124/70 mmHg 128/72 mmHg      Blood Pressure (Exercise) 150/70 mmHg 138/82 mmHg      Blood Pressure (Exit) 122/80 mmHg       Heart  Rate (Admit) 86 bpm 77 bpm      Heart Rate (Exercise) 111 bpm 108 bpm      Heart Rate (Exit) 103 bpm       Oxygen Saturation (Admit) 96 % 97 %      Oxygen Saturation (Exercise) 95 % 94 %      Oxygen Saturation (Exit) 94 %       Rating of Perceived Exertion (Exercise) 13 15  Perceived Dyspnea (Exercise) 4 4      Symptoms None none      Duration Progress to 30 minutes of continuous aerobic without signs/symptoms of physical distress Progress to 50 minutes of aerobic without signs/symptoms of physical distress      Intensity Rest + 30 THRR unchanged      Progression Continue progressive overload as per policy without signs/symptoms or physical distress. Continue progressive overload as per policy without signs/symptoms or physical distress.      Resistance Training   Training Prescription Yes Yes      Weight 5 5      Reps 10-12 10-15      Interval Training   Interval Training No No      Treadmill   MPH 3.3 3.3      Grade 1 1      Minutes 15 15      Recumbant Bike   Level 5 5      RPM 60 60      Minutes 15 15      NuStep   Level 6 7      Watts 70  80W was for the older style NS, Lenus is now on newer style 92      Minutes 20 15         Discharge Exercise Prescription (Final Exercise Prescription Changes):     Exercise Prescription Changes - 12/11/14 1100    Exercise Review   Progression Yes   Response to Exercise   Blood Pressure (Admit) 128/72 mmHg   Blood Pressure (Exercise) 138/82 mmHg   Heart Rate (Admit) 77 bpm   Heart Rate (Exercise) 108 bpm   Oxygen Saturation (Admit) 97 %   Oxygen Saturation (Exercise) 94 %   Rating of Perceived Exertion (Exercise) 15   Perceived Dyspnea (Exercise) 4   Symptoms none   Duration Progress to 50 minutes of aerobic without signs/symptoms of physical distress   Intensity THRR unchanged   Progression Continue progressive overload as per policy without signs/symptoms or physical distress.   Resistance Training   Training  Prescription Yes   Weight 5   Reps 10-15   Interval Training   Interval Training No   Treadmill   MPH 3.3   Grade 1   Minutes 15   Recumbant Bike   Level 5   RPM 60   Minutes 15   NuStep   Level 7   Watts 92   Minutes 15       Nutrition:  Target Goals: Understanding of nutrition guidelines, daily intake of sodium <1576m, cholesterol <2026m calories 30% from fat and 7% or less from saturated fats, daily to have 5 or more servings of fruits and vegetables.  Biometrics:     Pre Biometrics - 10/19/14 1012    Pre Biometrics   Height 6' 1"  (1.854 m)   Weight 236 lb 8 oz (107.276 kg)   Waist Circumference 43.5 inches   Hip Circumference 44 inches   Waist to Hip Ratio 0.99 %   BMI (Calculated) 31.3       Nutrition Therapy Plan and Nutrition Goals:     Nutrition Therapy & Goals - 11/20/14 1526    Nutrition Therapy   Diet Instructed patient on a 2000 calorie meal plant to promote weight loss including general dietary guidelines for lung health and DASH diet principles   Fiber 20 grams   Whole Grain Foods 3 servings   Protein 8 ounces/day   Saturated Fats 13  max. grams   Fruits and Vegetables 5 servings/day   Personal Nutrition Goals   Personal Goal #1 To work on a more consistent pattern of eating with 3 meals per day spaced 4-6 hours apart. Include only 1 evening snack vs. grazing.   Personal Goal #2 Decrease sweetened tea from 1/2 gallon per day to 1-2 (10 oz) glasses at meals.   Personal Goal #3 Try 1% milk and limit milk products to 3 per day including yogurt and cheese (no more than once daily).   Personal Goal #4 Read food labels for sodium with goal of no more than 661m sodium per meal.   Additional Goals? Yes   Personal Goal #5 Add steamed vegetables and fruit to "take out" sandwich meals vs. fries or chips.   Personal Goal #6 Increase water intake. Keep bottled water with you.      Nutrition Discharge: Rate Your Plate Scores:     Rate Your Plate -  120/25/4217062   Rate Your Plate Scores   Pre Score 55   Pre Score % 61 %      Psychosocial: Target Goals: Acknowledge presence or absence of depression, maximize coping skills, provide positive support system. Participant is able to verbalize types and ability to use techniques and skills needed for reducing stress and depression.  Initial Review & Psychosocial Screening:     Initial Psych Review & Screening - 10/17/14 1130    Family Dynamics   Good Support System? Yes   Comments Mr SViverettehas good support from his 3 children and wife. He is managing his COPD well with pacing through activities. 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   Barriers   Psychosocial barriers to participate in program There are no identifiable barriers or psychosocial needs.;The patient should benefit from training in stress management and relaxation.   Screening Interventions   Interventions Encouraged to exercise      Quality of Life Scores:     Quality of Life - 10/17/14 1130    Quality of Life Scores   Health/Function Pre 17.23 %   Socioeconomic Pre 17 %   Psych/Spiritual Pre 22.55 %   Family Pre 22.8 %   GLOBAL Pre 18.98 %      PHQ-9:     Recent Review Flowsheet Data    Depression screen PHQ 2/9 10/17/2014   Decreased Interest 1   Down, Depressed, Hopeless 0   PHQ - 2 Score 1      Psychosocial Evaluation and Intervention:     Psychosocial Evaluation - 11/06/14 1053    Psychosocial Evaluation & Interventions   Interventions Stress management education;Relaxation education;Encouraged to exercise with the program and follow exercise prescription   Comments Counselor met with Mr. SBielinskitoday for initial psychosocial evaluation.  He is a 59year old who has COPD and multiple health issues, including kidney disease, degenerative disk problems in his lower back and a blood cancer disease that is currently in remission.  Mr.  SGuimondhas a strong support system with a significant other of 5 years and an adult son who lives close by.  He reports he was sleeping too much and eating too much until recently when he quit smoking, began new medication and working out, so these things have improved.  Mr. SErnydenies a history of depression or anxiety or current symptoms.  He admitted to being very "grouchy" after he quit smoking several weeks  ago, but that has improved with time.  He states he is typically in a positive mood most of the time.  Mr. Buenger reports he has some stress in his life as he is around people who smoke frequently and this is counterproductive to his goal to quit.  Counselor worked with him on "triggers" and ways to change his lifestyle in order to promote success in this area.  His primary goal is to dance an entire song without stopping by this end of this pulmonary program.  Mr. Melichar will benefit from meeting with the dietician to accomplish his weight loss and healthier eating goals.  Counselor will follow up with Mr. Temme on these goals.   Continued Psychosocial Services Needed Yes  Mr. Kumari will benefit from the psychoeducational components of this program in order to help him relax and handle the stress of quitting smoking better.  He will also benefit from seeing the dietician for weight loss goals.        Psychosocial Re-Evaluation:     Psychosocial Re-Evaluation      11/27/14 1058           Psychosocial Re-Evaluation   Comments Follow up with Mr. Rowand today reporting struggling more to breathe now that the weather is colder.  He states he was able to dance recently most of a song, although he definitely had to pace himself more than in the past.  This is an improvement and is one of his goals for this program.  Counselor commended Mr. Altland on this.  He admits to continuing to struggle with smoking cessation and this has "set him back" a bit from accomplishing his goals.  Discussed his plan to  get back on track as well as his nutrition plan following meeting with the dietician.  Mr. Cordaro is aware that stress is a "trigger" for him and has alternative ways to cope without smoking.  Counselor will continue to follow with him.           Education: Education Goals: Education classes will be provided on a weekly basis, covering required topics. Participant will state understanding/return demonstration of topics presented.  Learning Barriers/Preferences:     Learning Barriers/Preferences - 10/17/14 1130    Learning Barriers/Preferences   Learning Barriers None   Learning Preferences Group Instruction;Individual Instruction;Pictoral;Skilled Demonstration;Verbal Instruction;Video;Written Material      Education Topics: Initial Evaluation Education: - Verbal, written and demonstration of respiratory meds, RPE/PD scales, oximetry and breathing techniques. Instruction on use of nebulizers and MDIs: cleaning and proper use, rinsing mouth with steroid doses and importance of monitoring MDI activations.          Pulmonary Rehab from 12/11/2014 in [place]   Date  10/17/14   Educator  LB    Instruction Review Code  2- meets goals/outcomes      General Nutrition Guidelines/Fats and Fiber: -Group instruction provided by verbal, written material, models and posters to present the general guidelines for heart healthy nutrition. Gives an explanation and review of dietary fats and fiber.      Pulmonary Rehab from 12/11/2014 in [place]   Date  12/11/14   Educator  CR   Instruction Review Code  2- meets goals/outcomes      Controlling Sodium/Reading Food Labels: -Group verbal and written material supporting the discussion of sodium use in heart healthy nutrition. Review and explanation with models, verbal and written materials for utilization of the food label.  Pulmonary Rehab from 12/11/2014 in [place]   Date  11/17/14   Educator  Candiss Norse    Instruction Review Code  2- meets goals/outcomes      Exercise Physiology & Risk Factors: - Group verbal and written instruction with models to review the exercise physiology of the cardiovascular system and associated critical values. Details cardiovascular disease risk factors and the goals associated with each risk factor.   Aerobic Exercise & Resistance Training: - Gives group verbal and written discussion on the health impact of inactivity. On the components of aerobic and resistive training programs and the benefits of this training and how to safely progress through these programs.   Flexibility, Balance, General Exercise Guidelines: - Provides group verbal and written instruction on the benefits of flexibility and balance training programs. Provides general exercise guidelines with specific guidelines to those with heart or lung disease. Demonstration and skill practice provided.      Pulmonary Rehab from 12/11/2014 in [place]   Date  11/15/14   Educator  SW   Instruction Review Code  2- meets goals/outcomes      Stress Management: - Provides group verbal and written instruction about the health risks of elevated stress, cause of high stress, and healthy ways to reduce stress.      Pulmonary Rehab from 12/11/2014 in [place]   Date  11/08/14   Educator  [facility]   Instruction Review Code  2- meets goals/outcomes      Depression: - Provides group verbal and written instruction on the correlation between heart/lung disease and depressed mood, treatment options, and the stigmas associated with seeking treatment.   Exercise & Equipment Safety: - Individual verbal instruction and demonstration of equipment use and safety with use of the equipment.      Pulmonary Rehab from 12/11/2014 in [place]   Date  10/20/14   Educator  [place]   Instruction Review Code  2- meets goals/outcomes      Infection Prevention: - Provides verbal and written material to individual with discussion of infection control including proper hand washing and proper equipment cleaning during exercise session.      Pulmonary Rehab from 12/11/2014 in [place]   Date  10/20/14   Educator  [place]   Instruction Review Code  2- meets goals/outcomes      Falls Prevention: - Provides verbal and written material to individual with discussion of falls prevention and safety.      Pulmonary Rehab from 12/11/2014 in [place]   Date  10/17/14   Educator  LB   Instruction Review Code  2- meets goals/outcomes      Diabetes: - Individual verbal and written instruction to review signs/symptoms of diabetes, desired ranges of glucose level fasting, after meals and with exercise. Advice that pre and post exercise glucose checks will be done for 3 sessions at entry of program.   Chronic Lung Diseases: - Group verbal and written instruction to review new updates, new respiratory medications, new advancements in procedures and treatments. Provide informative websites and "800" numbers of self-education.      Pulmonary Rehab from 12/11/2014 in [place]   Date  11/13/14   Educator  LB   Instruction Review Code  2- meets goals/outcomes      Lung Procedures: - Group verbal and written  instruction to describe testing methods done to diagnose lung disease. Review the outcome of test results. Describe the treatment choices: Pulmonary Function Tests, ABGs and oximetry.      Pulmonary Rehab from 12/11/2014 in [place]   Date  11/10/14   Educator  [place]   Instruction Review Code  2- meets goals/outcomes      Energy Conservation: - Provide group verbal and  written instruction for methods to conserve energy, plan and organize activities. Instruct on pacing techniques, use of adaptive equipment and posture/positioning to relieve shortness of breath.      Pulmonary Rehab from 12/11/2014 in [place]   Date  11/22/14   Educator  [place]   Instruction Review Code  2- meets goals/outcomes      Triggers: - Group verbal and written instruction to review types of environmental controls: home humidity, furnaces, filters, dust mite/pet prevention, HEPA vacuums. To discuss weather changes, air quality and the benefits of nasal washing.   Exacerbations: - Group verbal and written instruction to provide: warning signs, infection symptoms, calling MD promptly, preventive modes, and value of vaccinations. Review: effective airway clearance, coughing and/or vibration techniques. Create an Sports administrator.   Oxygen: - Individual and group verbal and written instruction on oxygen therapy. Includes supplement oxygen, available portable oxygen systems, continuous and intermittent flow rates, oxygen safety, concentrators, and Medicare reimbursement for oxygen.   Respiratory Medications: - Group verbal and written instruction to review medications for lung disease. Drug class, frequency, complications, importance of spacers, rinsing mouth after steroid MDI's, and proper cleaning methods for nebulizers.      Pulmonary Rehab from 12/11/2014 in [place]   Date  10/17/14   Educator  LB   Instruction Review Code  2- meets goals/outcomes      AED/CPR: - Group verbal and written instruction with the use of models to demonstrate the basic use of the AED with the basic ABC's of resuscitation.   Breathing Retraining: - Provides individuals verbal and written instruction on purpose, frequency, and proper technique of diaphragmatic breathing and pursed-lipped breathing. Applies individual practice  skills.      Pulmonary Rehab from 12/11/2014 in [place]   Date  10/20/14   Educator  [place]   Instruction Review Code  2- meets goals/outcomes      Anatomy and Physiology of the Lungs: - Group verbal and written instruction with the use of models to provide basic lung anatomy and physiology related to function, structure and complications of lung disease.   Heart Failure: - Group verbal and written instruction on the basics of heart failure: signs/symptoms, treatments, explanation of ejection fraction, enlarged heart and cardiomyopathy.      Pulmonary Rehab from 12/11/2014 in [place]   Date  10/27/14   Educator  [place]   Instruction Review Code  2- meets goals/outcomes      Sleep Apnea: - Individual verbal and written instruction to review Obstructive Sleep Apnea. Review of risk factors, methods for diagnosing and types of masks and machines for OSA.   Anxiety: - Provides group, verbal and written instruction on the correlation between heart/lung disease and anxiety, treatment options, and management of anxiety.   Relaxation: - Provides group, verbal and written instruction about the benefits of relaxation for patients with heart/lung disease. Also provides patients with examples of relaxation techniques.      Pulmonary Rehab from 12/11/2014  in [place]   Date  12/06/14   Educator  [facility]   Instruction Review Code  2- Meets goals/outcomes      Knowledge Questionnaire Score:     Knowledge Questionnaire Score - 10/17/14 1130    Knowledge Questionnaire Score   Pre Score -5      Personal Goals and Risk Factors at Admission:     Personal Goals and Risk Factors at Admission - 11/03/14 1058    Personal Goals and Risk Factors on Admission   Lipids Yes  On meds for cholesterol control.   Goal Cholesterol controlled with medications as prescribed, with individualized  exercise RX and with personalized nutrition plan. Value goals: LDL < 9m, HDL > 463m Participant states understanding of desired cholesterol values and following prescriptions.   Intervention Provide nutrition & aerobic exercise along with prescribed medications to achieve LDL <7037mHDL >63m86m    Personal Goals and Risk Factors Review:      Goals and Risk Factor Review      10/27/14 1417 10/30/14 1000 11/03/14 1054 11/03/14 1059 11/03/14 1111   Weight Management   Goals Progress/Improvement seen   No     Comments   No weight change noted yet  5 sessions attended. To make appointment with RD. HAs cut down on milk consumption,and is working on prtion control. Still need uidance on calories/fat/portion control. Has started working on changing cereals from "sugar Crack" to whole grain cereals. HAs decreased sweets intake too.     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      Quit Smoking   Goals Progress/Improvement seen Yes Yes      Comments Mr. SmithStrahlenot had a cigarette in 1 week. Mr SmithUtzts he smokes occasionally and with stress. His trip was for legal matters and was very stressful, but he did not smoke there and states he has not had a cigarrette since his start date in [place], [place].      Breathing Techniques   Goals Progress/Improvement seen   Yes   --  Oran Marquonbreathing tests done this past Wednesday at the referring MD office. He was told his numbers are improved.   Comments  Mr SmithRobartssing PLB with his exercise goals and activites at home and states that it is helpful for shortness of breath.      Hypertension   Goal   --  Shayaan Guthrie not have HTN listed as a risk factor.     Abnormal Lipids   Progress seen towards goals     Unknown    Comments    On meds for cholesterol control. Is working on eating habits,waiting to see the RD for advice.      11/06/14 1501 11/08/14 1000 11/17/14 1240 11/17/14 1454 11/20/14 1000   Weight Management   Goals Progress/Improvement seen   No     Comments   No weight changes since last spoke with [place].Eddie Dibbleshas made  a few nutriotin changes. Added yogurt to his meals, continues to decrease milk intake. Added iced tea, then decided the sugar in the tea might not be a great change. Maxxwell Colestons that he grazes after dinner and  ends up snacking all evening while watching TV. HE has committed to spending 2 nights a week working on a project in his shop to curb his Grazing" after dinner.  He has set a goal to lose 2 pounds in the next two weeks.     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 This is when it is difficult to not smoke when around other people are smoking.      Understand more about Heart/Pulmonary Disease   Goals Progress/Improvement seen  Yes    Yes   Comments Educated Mr Vanriper on his PFT and related it to his COPD. Educated him on the goal to maintain his Spirometry results, prevent them from worsening, as well as his COPD, by exercising, smoking cessation, using his MDI's, good diet, and wearing oxygen if needed.    In the past, Mr Swayze modified his activity to adjust for his shortness of breath and COPD. He loves to [place] dance and made a song that was only 89mn long. Last Friday, he was able to shang dance with his wife for 3-4 mins which was a big accomplishment and very important to him.   Improve shortness of breath with ADL's   Goals Progress/Improvement seen   Yes  Yes    Comments  Mr SDubraywas having a "bad breathing day". He had mowed the other day and inhaled dusty air. We talked about hime wearing a mask with yardwork, nasal washing after the work, and using  his inhaler for rescue.  Mr. SMurrillosays he is not using as much Proventil since he has been in rehab.  He is not running out before the month's end.  He is also doing more. He is helping his son remodel an old home.    Breathing Techniques   Goals Progress/Improvement seen     Yes    Comments    Mr. SOrileyis using proper technique for PLB while on the TM.    Increase knowledge of respiratory medications   Goals Progress/Improvement seen   Yes  Yes    Comments  Mr SElizondohas a good understanding of his MDI's. We discussed using his Albuterol MDI before exercise in [facility] and the importance of Spiriva with exercise.  Mr. SGervasiis taking Spiriva, Symbicort and Proventil MDIs.  He is rinsing his mouth after taking Spiriva and Symbicort.    Hypertension   Goal   Participant will see blood pressure controlled within the values of 140/923mHg or within value directed by their physician.     Progress seen toward goals   Yes     Comments   PAAlviatates he is not on any medications presently for BP control. His documented BP readings are in normal range.      Abnormal Lipids   Progress seen towards goals   Unknown     Comments   HAs an appt with the RD next week. PACataldoontinues to work on chGames developerabits to better choices. Added yogurt to his meals since last time I spoke with him.        11/27/14 1111 11/29/14 1538 12/04/14 1051 12/04/14 1611 12/06/14 1102   Weight Management   Goals Progress/Improvement seen  Yes      Comments  [place] has added some project time to his evenings. He helped on a project that  exposed him to "asbestos" dust. He has been sick since this exposure and he was advised to call his MD if he continues to be sick. Zamir has removed all the junk food from his house since we last spoke and has added more fruit and vegetables to his daily meals. He is down over 3 pounds since making the changes.       Increase Aerobic Exercise and Physical Activity   Goals Progress/Improvement  seen  Yes  Yes  Yes   Comments Deon used all his inhalers the other day so [name], RN instructed him to call his MD. Today he reported he has limited his cigarette smoking except Thursday he smoked 14 cigarettes. He has also been around asbestors.   Patient reported that walking from his car to the pulmonary rehab gym is now much easier than it was on the first day he came to rehab. He stated that his exercise prescription is challenging, but on a good breathing day he does increase his workload slightly to continually challenge himself.   Suleman said that the treadmill is the hardest machine for him, but he has noticed his hard work on the treadmill paying off as he can walk easier and faster without illiciting symptoms. He is very encouraged by this progress and attributes it to his consistent attendance in [place].   Quit Smoking   Goals Progress/Improvement seen    Yes    Comments    Mr Spooner has not smoked since 11/27/2014, even over the Holiday's. He is very determined not to smoke and can feel a difference with his breathing since he has quit.    Improve shortness of breath with ADL's   Goals Progress/Improvement seen  No       Comments Spurgeon used all his inhalers the other day so [name], RN instructed him to call his MD. Today he reported he has limited his cigarette smoking except Thursday he smoked 14 cigarettes. He has also been around asbestors.           Personal Goals Discharge (Final Personal Goals and Risk Factors Review):      Goals and Risk Factor Review - 12/06/14 1102    Increase Aerobic Exercise and Physical Activity   Goals Progress/Improvement seen  Yes   Comments Radley said that the treadmill is the hardest machine for him, but he has noticed his hard work on the treadmill paying off as he can walk easier and faster without illiciting symptoms. He is very encouraged by this progress and attributes it to his consistent attendance in [place].      ITP  Comments:   Comments: 30 Day Review

## 2014-12-13 ENCOUNTER — Encounter: Payer: Medicaid Other | Admitting: *Deleted

## 2014-12-13 DIAGNOSIS — J449 Chronic obstructive pulmonary disease, unspecified: Secondary | ICD-10-CM | POA: Diagnosis not present

## 2014-12-13 NOTE — Progress Notes (Signed)
Daily Session Note  Patient Details  Name: Nathaniel [name] MRN: [id number] Date of Birth: 30-Dec-1955 Referring Provider:  Sharlyne Cai, NP  Encounter Date: 12/13/2014  Check In:     Session Check In - 12/13/14 1055    Check-In   Staff Present Carson Myrtle, BS, RRT, Respiratory Therapist;Steven Way, BS, ACSM EP-C, Exercise Physiologist;Jesseka Drinkard Dillard Essex, MS, ACSM CEP, Exercise Physiologist   ER physicians immediately available to respond to emergencies [facility] immediately available ER MD   Physician(s) Clearnce Hasten and Joni Fears   Medication changes reported     No   Fall or balance concerns reported    No   Warm-up and Cool-down Performed on first and last piece of equipment   VAD Patient? No   Pain Assessment   Currently in Pain? No/denies   Multiple Pain Sites No           Exercise Prescription Changes - 12/13/14 1000    Exercise Review   Progression Yes   Response to Exercise   Symptoms none   Comments Wardell Honour with regular high intensity interval training. He was educated on proper HIIT training and how to progress with it.    Duration Progress to 50 minutes of aerobic without signs/symptoms of physical distress   Intensity THRR unchanged   Progression Continue progressive overload as per policy without signs/symptoms or physical distress.   Resistance Training   Training Prescription Yes   Weight 5   Reps 10-15   Interval Training   Interval Training Yes   Equipment NuStep;Recumbant Bike   Comments NS and RB, interval for 2-3 minutes with increase in speed and level. Drop back to moderate base intensity for 3-5 minutes. Specific workloads and times written out in exercise chart.    Treadmill   MPH 3.3   Grade 1   Minutes 15   Recumbant Bike   Level 5   RPM 60   Minutes 15   NuStep   Level 7   Watts 92   Minutes 15      Goals Met:  Proper associated with RPD/PD & O2 Sat Independence with exercise equipment Using PLB without cueing &  demonstrates good technique Exercise tolerated well Personal goals reviewed Strength training completed today  Goals Unmet:  Not Applicable  Goals Comments: Reviewed individualized exercise prescription and made increases per departmental policy. Exercise increases were discussed with the patient and they were able to perform the new work loads without issue (no signs or symptoms). See exercise changes above for specifics of added interval training. Personal and exercise goals expected to be met in 17 more sessions. Progress on specific individualized goals will be charted in patient's ITP. Upon completion of the program the patient will be comfortable managing exercise goals and progression on their own.     Dr. Emily Filbert is Medical Director for [place] and [facility].

## 2014-12-15 ENCOUNTER — Encounter: Payer: Medicaid Other | Admitting: *Deleted

## 2014-12-15 DIAGNOSIS — J449 Chronic obstructive pulmonary disease, unspecified: Secondary | ICD-10-CM | POA: Diagnosis not present

## 2014-12-15 NOTE — Progress Notes (Signed)
Daily Session Note  Patient Details  Name: Nathaniel [name] MRN: [id number] Date of Birth: 12/29/1955 Referring Provider:  Strayhorn, Martha D, NP  Encounter Date: 12/15/2014  Check In:     Session Check In - 12/15/14 1053    Check-In   Staff Present Susanne Bice, RN, BSN, CCRP;Stacey Joyce, RRT, RCP, Respiratory Therapist;Renee MacMillan, MS, ACSM CEP, Exercise Physiologist   ER physicians immediately available to respond to emergencies [facility] immediately available ER MD   Physician(s) [place] and Mc[place]ane   Medication changes reported     No   Fall or balance concerns reported    No   Warm-up and Cool-down Performed on first and last piece of equipment   VAD Patient? No   Pain Assessment   Currently in Pain? No/denies   Multiple Pain Sites No           Exercise Prescription Changes - 12/15/14 1000    Exercise Review   Progression Yes   Response to Exercise   Symptoms None   Comments Reviewed individualized exercise prescription and made increases per departmental policy. Exercise increases were discussed with the patient and they were able to perform the new work loads without issue (no signs or symptoms).    Duration Progress to 50 minutes of aerobic without signs/symptoms of physical distress   Intensity THRR unchanged   Progression Continue progressive overload as per policy without signs/symptoms or physical distress.   Resistance Training   Training Prescription Yes   Weight 5   Reps 10-15   Interval Training   Interval Training Yes   Equipment NuStep;Recumbant Bike   Comments NS and RB, interval for 2-3 minutes with increase in speed and level. Drop back to moderate base intensity for 3-5 minutes. Specific workloads and times written out in exercise chart.    Treadmill   MPH 3.3   Grade 2   Minutes 15   Recumbant Bike   Level 5   RPM 60   Minutes 15   NuStep   Level 7   Watts 90   Minutes 15      Goals Met:  Proper associated with RPD/PD & O2  Sat Independence with exercise equipment Using PLB without cueing & demonstrates good technique Exercise tolerated well Personal goals reviewed Strength training completed today  Goals Unmet:  Not Applicable  Goals Comments: Patient completed exercise prescription and all exercise goals during rehab session. The exercise was tolerated well and the patient is progressing in the program. Personal and exercise goals expected to be met in 16 more sessions. Progress on specific individualized goals will be charted in patient's ITP. Upon completion of the program the patient will be comfortable managing exercise goals and progression on their own.    Dr. Mark Miller is Medical Director for [facility] and [facility]. 

## 2014-12-18 ENCOUNTER — Encounter: Payer: Medicaid Other | Admitting: *Deleted

## 2014-12-18 DIAGNOSIS — J449 Chronic obstructive pulmonary disease, unspecified: Secondary | ICD-10-CM

## 2014-12-18 NOTE — Progress Notes (Signed)
Daily Session Note  Patient Details  Name: Nathaniel [name] MRN: [id number] Date of Birth: [birth year] Referring Provider:  Ellamae Sia, MD  Encounter Date: 12/18/2014  Check In:     Session Check In - 12/18/14 1027    Check-In   Staff Present Carson Myrtle, BS, RRT, Respiratory Bertis Ruddy, BS, ACSM CEP, Exercise Physiologist;Steven Way, BS, ACSM EP-C, Exercise Physiologist   ER physicians immediately available to respond to emergencies [facility] immediately available ER MD   Physician(s) Kerman Passey and Schaevitz   Medication changes reported     No   Fall or balance concerns reported    No   Warm-up and Cool-down Performed on first and last piece of equipment   VAD Patient? No   Pain Assessment   Currently in Pain? No/denies   Multiple Pain Sites No           Exercise Prescription Changes - 12/18/14 1000    Exercise Review   Progression Yes   Response to Exercise   Symptoms None   Comments Reviewed individualized exercise prescription and made increases per departmental policy. Exercise increases were discussed with the patient and they were able to perform the new work loads without issue (no signs or symptoms).    Duration Progress to 50 minutes of aerobic without signs/symptoms of physical distress   Intensity THRR unchanged   Progression Continue progressive overload as per policy without signs/symptoms or physical distress.   Resistance Training   Training Prescription Yes   Weight 5   Reps 10-15   Interval Training   Interval Training Yes   Equipment NuStep;Recumbant Bike   Comments NS and RB, interval for 2-3 minutes with increase in speed and level. Drop back to moderate base intensity for 3-5 minutes. Specific workloads and times written out in exercise chart.    Treadmill   MPH 3.5  speeds between 3.3-3.5 mph   Grade 3   Minutes 15   Recumbant Bike   Level 5   RPM 60   Minutes 15   NuStep   Level 7   Watts 90   Minutes 15       Goals Met:  Proper associated with RPD/PD & O2 Sat Independence with exercise equipment Exercise tolerated well Personal goals reviewed Strength training completed today  Goals Unmet:  Not Applicable  Goals Comments: Reviewed individualized exercise prescription and made increases per departmental policy. Exercise increases were discussed with the patient and they were able to perform the new work loads without issue (no signs or symptoms).     Dr. Emily Filbert is Medical Director for [place] and [facility].

## 2014-12-22 ENCOUNTER — Encounter: Payer: Medicaid Other | Admitting: *Deleted

## 2014-12-22 DIAGNOSIS — J449 Chronic obstructive pulmonary disease, unspecified: Secondary | ICD-10-CM | POA: Diagnosis not present

## 2014-12-22 NOTE — Progress Notes (Signed)
Daily Session Note  Patient Details  Name: Nathaniel [name] MRN: [id number] Date of Birth: 05/21/1955 Referring Provider:  Sharlyne Cai, NP  Encounter Date: 12/22/2014  Check In:     Session Check In - 12/22/14 1221    Check-In   Staff Present Frederich Cha, RRT, RCP, Respiratory Therapist;Rickie Gange Dillard Essex, MS, ACSM CEP, Exercise Physiologist;Mary Kellie Shropshire, RN   ER physicians immediately available to respond to emergencies [facility] immediately available ER MD   Physician(s) Jimmye Norman and Mariea Clonts   Medication changes reported     No   Fall or balance concerns reported    No   VAD Patient? No   Pain Assessment   Currently in Pain? No/denies   Multiple Pain Sites No           Exercise Prescription Changes - 12/22/14 1200    Exercise Review   Progression Yes   Response to Exercise   Symptoms None   Comments Added an incline to the TM for Kala due to an increase in his stamina on the treadmill and to provide another form of progression for him.    Duration Progress to 50 minutes of aerobic without signs/symptoms of physical distress   Intensity THRR unchanged   Progression Continue progressive overload as per policy without signs/symptoms or physical distress.   Resistance Training   Training Prescription Yes   Weight 5   Reps 10-15   Interval Training   Interval Training Yes   Equipment NuStep;Recumbant Bike   Comments NS and RB, interval for 2-3 minutes with increase in speed and level. Drop back to moderate base intensity for 3-5 minutes. Specific workloads and times written out in exercise chart.    Treadmill   MPH 3.5  speeds between 3.3-3.5 mph   Grade 4   Minutes 15   Recumbant Bike   Level 5   RPM 60   Minutes 15   NuStep   Level 7   Watts 90   Minutes 15      Goals Met:  Proper associated with RPD/PD & O2 Sat Independence with exercise equipment Using PLB without cueing & demonstrates good technique Exercise tolerated well Personal goals  reviewed Strength training completed today  Goals Unmet:  Not Applicable  Goals Comments: Patient completed exercise prescription and all exercise goals during rehab session. The exercise was tolerated well and the patient is progressing in the program. Personal and exercise goals expected to be met in 14 more sessions. Progress on specific individualized goals will be charted in patient's ITP. Upon completion of the program the patient will be comfortable managing exercise goals and progression on their own.    Dr. Emily Filbert is Medical Director for [place] and [facility].

## 2014-12-29 ENCOUNTER — Encounter: Payer: Medicaid Other | Admitting: *Deleted

## 2014-12-29 DIAGNOSIS — J449 Chronic obstructive pulmonary disease, unspecified: Secondary | ICD-10-CM

## 2014-12-29 NOTE — Progress Notes (Signed)
Daily Session Note  Patient Details  Name: Nathaniel [name] MRN: [id number] Date of Birth: [birth year] Referring Provider:  Ellamae Sia, MD  Encounter Date: 12/29/2014  Check In:     Session Check In - 12/29/14 1141    Check-In   Staff Present Nyoka Cowden, RN;Eveline Sauve, RN, BSN, CCRP;Stacey Blanch Media, RRT, RCP, Respiratory Therapist   ER physicians immediately available to respond to emergencies [facility] immediately available ER MD   Physician(s) Some increased SOB today, workloads were decreased and did well with the decreased workload.   Medication changes reported     No   Fall or balance concerns reported    No   Warm-up and Cool-down Performed on first and last piece of equipment   VAD Patient? No   Pain Assessment   Currently in Pain? Other (Comment)  Weather conditions have flared knee and hip pain today. Will exercise at lighter level.  Old rotator cuff injury hurting today too.         Goals Met:  Independence with exercise equipment Exercise tolerated well Strength training completed today  Goals Unmet:  Increased chronic pain levels today knees, hips and left shoulder. Workloads decreased . No increased pain reported.  Goals Comments: Doing well with exercise prescription progression. Expected to reach program goals in 13 visits.   Dr. Emily Filbert is Medical Director for [place] and [facility].

## 2015-01-02 ENCOUNTER — Encounter: Payer: Self-pay | Admitting: Respiratory Therapy

## 2015-01-02 DIAGNOSIS — J449 Chronic obstructive pulmonary disease, unspecified: Secondary | ICD-10-CM

## 2015-01-02 NOTE — Progress Notes (Signed)
Pulmonary Individual Treatment Plan  Patient Details  Name: Nathaniel [name] MRN: [id number] Date of Birth: [birth year] Referring Provider:  Dr Ines Bloomer  Initial Encounter Date: 10/17/2014  Visit Diagnosis: COPD, moderate ([place])  Patient's Home Medications on Admission:  Current outpatient prescriptions:    albuterol (PROVENTIL HFA;VENTOLIN HFA) 108 (90 BASE) MCG/ACT inhaler, Inhale 2 puffs into the lungs every 6 (six) hours as needed for wheezing or shortness of breath., Disp: , Rfl:    albuterol (PROVENTIL HFA;VENTOLIN HFA) 108 (90 BASE) MCG/ACT inhaler, Inhale into the lungs., Disp: , Rfl:    aspirin EC 81 MG tablet, Take 81 mg by mouth., Disp: , Rfl:    budesonide-formoterol (SYMBICORT) 160-4.5 MCG/ACT inhaler, Inhale into the lungs., Disp: , Rfl:    COMBIVENT RESPIMAT 20-100 MCG/ACT AERS respimat, Inhale 1 puff into the lungs daily., Disp: , Rfl: 11   enalapril (VASOTEC) 10 MG tablet, Take 1 tablet by mouth at bedtime., Disp: , Rfl: 11   hydrocortisone cream 1 %, Apply 1 application topically 2 (two) times daily as needed., Disp: , Rfl:    Na Sulfate-K Sulfate-Mg Sulf (SUPREP BOWEL PREP) SOLN, Take 1 kit by mouth as directed., Disp: 1 Bottle, Rfl: 0   oxyCODONE-acetaminophen (PERCOCET/ROXICET) 5-325 MG per tablet, Take 1 tablet by mouth every 6 (six) hours as needed., Disp: , Rfl: 0   pravastatin (PRAVACHOL) 10 MG tablet, Take 10 mg by mouth daily., Disp: , Rfl:    sildenafil (VIAGRA) 100 MG tablet, Take 1 tablet by mouth as needed., Disp: , Rfl:    tiotropium (SPIRIVA HANDIHALER) 18 MCG inhalation capsule, Place into inhaler and inhale., Disp: , Rfl:   Past Medical History: Past Medical History  Diagnosis Date   COPD (chronic obstructive pulmonary disease) (HCC)    Hypertension    Chronic back pain     Dr. Quay Burow manages (per pt)   MGUS (monoclonal gammopathy of unknown significance)    Proteinuria    Hearing loss    Hyperlipidemia    Tobacco abuse      Tobacco Use: History  Smoking status   Current Some Day Smoker -- 2.00 packs/day for 45 years   Types: Cigarettes  Smokeless tobacco   Never Used    Labs: Recent Review Flowsheet Data    There is no flowsheet data to display.       ADL [facility]SD:     ADL [facility]      10/17/14 1130       ADL [facility]   ADL Phase Entry     SOB Score total 48     Rest 1     Walk 3     Stairs 4     Bath 2     Dress 0     Shop 0         Pulmonary Function Assessment:     Pulmonary Function Assessment - 10/17/14 1130    Pulmonary Function Tests   RV% 270 %   DLCO% 67 %   Initial Spirometry Results   FVC% 45.9 %   FEV1% 23 %   FEV1/FVC Ratio 40   Post Bronchodilator Spirometry Results   FVC% 45.1 %   FEV1% 34 %   FEV1/FVC Ratio 40      Exercise Target Goals:    Exercise Program Goal: Individual exercise prescription set with THRR, safety & activity barriers. Participant demonstrates ability to understand and report RPE using BORG scale, to self-measure pulse accurately, and to acknowledge the importance of  the exercise prescription.  Exercise Prescription Goal: Starting with aerobic activity 30 plus minutes a day, 3 days per week for initial exercise prescription. Provide home exercise prescription and guidelines that participant acknowledges understanding prior to discharge.  Activity Barriers & Risk Stratification:     Activity Barriers & Risk Stratification - 10/17/14 1130    Activity Barriers & Risk Stratification   Activity Barriers Back Problems;Shortness of Breath;Deconditioning;Muscular Weakness   Risk Stratification Moderate      6 Minute Walk:     6 Minute Walk      10/19/14 1007 12/11/14 1053     6 Minute Walk   Phase Initial Mid Program    Distance 1340 feet 1500 feet    Walk Time 6 minutes 6 minutes    Resting HR 81 bpm 77 bpm    Resting BP 122/64 mmHg 128/72 mmHg    Max Ex. HR 91 bpm 101 bpm    Max Ex. BP 142/62 mmHg 138/82 mmHg    RPE 9 13     Perceived Dyspnea  2 3    Symptoms No No       Initial Exercise Prescription:     Initial Exercise Prescription - 10/19/14 1000    Date of Initial Exercise Prescription   Date 10/17/14   Treadmill   MPH 2.5   Grade 0   Minutes 10   Recumbant Bike   Level 2   RPM 40   Watts 20   Minutes 10   NuStep   Level 3   Watts 50   Minutes 10   Arm Ergometer   Level 1   Watts 10   Minutes 10   Recumbant Elliptical   Level 1   RPM 40   Watts 20   Minutes 10   Elliptical   Level 1   Speed 3   Minutes 1   REL-XR   Level 3   Watts 50   Minutes 10   Prescription Details   Frequency (times per week) 3   Duration Progress to 30 minutes of continuous aerobic without signs/symptoms of physical distress   Intensity   THRR REST +  30   Ratings of Perceived Exertion 11-15   Perceived Dyspnea 2-4   Progression Continue progressive overload as per policy without signs/symptoms or physical distress.   Resistance Training   Training Prescription Yes   Weight 2   Reps 10-15      Exercise Prescription Changes:     Exercise Prescription Changes      10/20/14 1100 10/27/14 1000 10/30/14 1100 11/03/14 1100 11/06/14 1000   Exercise Review   Progression  Yes Yes Yes Yes   Response to Exercise   Blood Pressure (Admit) 122/70 mmHg       Blood Pressure (Exercise) 142/90 mmHg       Blood Pressure (Exit) 116/78 mmHg       Heart Rate (Admit) 78 bpm       Heart Rate (Exercise) 114 bpm       Heart Rate (Exit) 88 bpm       Oxygen Saturation (Admit) 97 %       Oxygen Saturation (Exercise) 96 %       Oxygen Saturation (Exit) 97 %       Rating of Perceived Exertion (Exercise) 13       Perceived Dyspnea (Exercise) 3       Symptoms None None None None None   Comments First day of exercise! Patient  was oriented to the gym and the equipment functions and settings. Procedures and policies of the gym were outlined and explained. The patient's individual exercise prescription and treatment  plan were reviewed with them. All starting workloads were established based on the results of the functional testing  done at the initial intake visit. The plan for exercise progression was also introduced and progression will be customized based on the patient's performance and goals.  Reviewed individualized exercise prescription and made increases per departmental policy. Exercise increases were discussed with the patient and they were able to perform the new work loads without issue (no signs or symptoms).   Montavius says that he has not had a cigarette in 1 week. Reviewed individualized exercise prescription and made increases per departmental policy. Exercise increases were discussed with the patient and they were able to perform the new work loads without issue (no signs or symptoms).  Reviewed individualized exercise prescription and made increases per departmental policy. Exercise increases were discussed with the patient and they were able to perform the new work loads without issue (no signs or symptoms).  Reilley was given increases in intensity on the TM, NS, and RB today. Reviewed individualized exercise prescription and made increases per departmental policy. Exercise increases were discussed with the patient and they were able to perform the new work loads without issue (no signs or symptoms).    Duration Progress to 30 minutes of continuous aerobic without signs/symptoms of physical distress Progress to 30 minutes of continuous aerobic without signs/symptoms of physical distress Progress to 30 minutes of continuous aerobic without signs/symptoms of physical distress Progress to 30 minutes of continuous aerobic without signs/symptoms of physical distress Progress to 30 minutes of continuous aerobic without signs/symptoms of physical distress   Intensity Rest + 30 Rest + 30 Rest + 30 Rest + 30 Rest + 30   Progression Continue progressive overload as per policy without signs/symptoms or physical distress.  Continue progressive overload as per policy without signs/symptoms or physical distress. Continue progressive overload as per policy without signs/symptoms or physical distress. Continue progressive overload as per policy without signs/symptoms or physical distress. Continue progressive overload as per policy without signs/symptoms or physical distress.   Resistance Training   Training Prescription Yes Yes Yes Yes Yes   Weight 2 2 2 4 4    Reps 10-12 10-12 10-12 10-12 10-12   Interval Training   Interval Training No No No No No   Treadmill   MPH 2.6 2.7 2.7 2.7 3   Grade 0 0 0 0 0   Minutes 10 12 15 15 15    Recumbant Bike   Level 3.5 3.5 3.5 3.5 4   RPM 50 50 50 50 60   Minutes 15 15 15 15 15    NuStep   Level 3 3 3 3 4    Watts 50 50 50 50 50   Minutes 20 20 20 20 20      11/08/14 1000 11/13/14 1400 11/15/14 1000 11/17/14 1100 11/22/14 1000   Exercise Review   Progression Yes Yes Yes Yes Yes   Response to Exercise   Blood Pressure (Admit)  120/64 mmHg      Blood Pressure (Exercise)  160/80 mmHg      Blood Pressure (Exit)  122/66 mmHg      Heart Rate (Admit)  85 bpm      Heart Rate (Exercise)  129 bpm      Heart Rate (Exit)  101 bpm  Oxygen Saturation (Admit)  96 %      Oxygen Saturation (Exercise)  93 %      Oxygen Saturation (Exit)  94 %      Rating of Perceived Exertion (Exercise)  14      Perceived Dyspnea (Exercise)  4      Symptoms None None   None   Comments Travontae was given increases in intensity on the TM, NS, and RB today. Reviewed individualized exercise prescription and made increases per departmental policy. Exercise increases were discussed with the patient and they were able to perform the new work loads without issue (no signs or symptoms).  Alika was given increases in intensity on the TM, NS, and RB today. Reviewed individualized exercise prescription and made increases per departmental policy. Exercise increases were discussed with the patient and they were able to  perform the new work loads without issue (no signs or symptoms).    Reviewed individualized exercise prescription and made increases per departmental policy. Exercise increases were discussed with the patient and they were able to perform the new work loads without issue (no signs or symptoms).    Duration Progress to 30 minutes of continuous aerobic without signs/symptoms of physical distress Progress to 30 minutes of continuous aerobic without signs/symptoms of physical distress Progress to 30 minutes of continuous aerobic without signs/symptoms of physical distress Progress to 30 minutes of continuous aerobic without signs/symptoms of physical distress Progress to 30 minutes of continuous aerobic without signs/symptoms of physical distress   Intensity Rest + 30 Rest + 30 Rest + 30 Rest + 30 Rest + 30   Progression Continue progressive overload as per policy without signs/symptoms or physical distress. Continue progressive overload as per policy without signs/symptoms or physical distress. Continue progressive overload as per policy without signs/symptoms or physical distress. Continue progressive overload as per policy without signs/symptoms or physical distress. Continue progressive overload as per policy without signs/symptoms or physical distress.   Resistance Training   Training Prescription Yes Yes Yes Yes Yes   Weight 4 5 5 5 5    Reps 10-12 10-12 10-12 10-12 10-12   Interval Training   Interval Training No No No No No   Treadmill   MPH 3 3.3 3.3 3.3 3.3   Grade 1 1 1 1 1    Minutes 15 15 15 15 15    Recumbant Bike   Level 4 5 5 5 5    RPM 60 60 60 60 60   Minutes 15 15 15 15 15    NuStep   Level 5 5 5 6 6    Watts 80 80 80 80 70  80W was for the older style NS, Larue is now on newer style   Minutes 20 20 20 20 20      12/04/14 1636 12/11/14 1100 12/13/14 1000 12/15/14 1000 12/18/14 1000   Exercise Review   Progression Yes Yes Yes Yes Yes   Response to Exercise   Blood Pressure (Admit)  124/70 mmHg 128/72 mmHg      Blood Pressure (Exercise) 150/70 mmHg 138/82 mmHg      Blood Pressure (Exit) 122/80 mmHg       Heart Rate (Admit) 86 bpm 77 bpm      Heart Rate (Exercise) 111 bpm 108 bpm      Heart Rate (Exit) 103 bpm       Oxygen Saturation (Admit) 96 % 97 %      Oxygen Saturation (Exercise) 95 % 94 %  Oxygen Saturation (Exit) 94 %       Rating of Perceived Exertion (Exercise) 13 15      Perceived Dyspnea (Exercise) 4 4      Symptoms None none none None None   Comments   Started Maycen with regular high intensity interval training. He was educated on proper HIIT training and how to progress with it.  Reviewed individualized exercise prescription and made increases per departmental policy. Exercise increases were discussed with the patient and they were able to perform the new work loads without issue (no signs or symptoms).  Reviewed individualized exercise prescription and made increases per departmental policy. Exercise increases were discussed with the patient and they were able to perform the new work loads without issue (no signs or symptoms).    Duration Progress to 30 minutes of continuous aerobic without signs/symptoms of physical distress Progress to 50 minutes of aerobic without signs/symptoms of physical distress Progress to 50 minutes of aerobic without signs/symptoms of physical distress Progress to 50 minutes of aerobic without signs/symptoms of physical distress Progress to 50 minutes of aerobic without signs/symptoms of physical distress   Intensity Rest + 30 THRR unchanged THRR unchanged THRR unchanged THRR unchanged   Progression Continue progressive overload as per policy without signs/symptoms or physical distress. Continue progressive overload as per policy without signs/symptoms or physical distress. Continue progressive overload as per policy without signs/symptoms or physical distress. Continue progressive overload as per policy without signs/symptoms or  physical distress. Continue progressive overload as per policy without signs/symptoms or physical distress.   Resistance Training   Training Prescription Yes Yes Yes Yes Yes   Weight 5 5 5 5 5    Reps 10-12 10-15 10-15 10-15 10-15   Interval Training   Interval Training No No Yes Yes Yes   Equipment   NuStep;Recumbant Bike NuStep;Recumbant Bike NuStep;Recumbant Bike   Comments   NS and RB, interval for 2-3 minutes with increase in speed and level. Drop back to moderate base intensity for 3-5 minutes. Specific workloads and times written out in exercise chart.  NS and RB, interval for 2-3 minutes with increase in speed and level. Drop back to moderate base intensity for 3-5 minutes. Specific workloads and times written out in exercise chart.  NS and RB, interval for 2-3 minutes with increase in speed and level. Drop back to moderate base intensity for 3-5 minutes. Specific workloads and times written out in exercise chart.    Treadmill   MPH 3.3 3.3 3.3 3.3 3.5  speeds between 3.3-3.5 mph   Grade 1 1 1 2 3    Minutes 15 15 15 15 15    Recumbant Bike   Level 5 5 5 5 5    RPM 60 60 60 60 60   Minutes 15 15 15 15 15    NuStep   Level 6 7 7 7 7    Watts 70  80W was for the older style NS, Alcide is now on newer style 92 92 90 90   Minutes 20 15 15 15 15      12/22/14 1200           Exercise Review   Progression Yes       Response to Exercise   Symptoms None       Comments Added an incline to the TM for [place] due to an increase in his stamina on the treadmill and to provide another form of progression for him.        Duration Progress to  50 minutes of aerobic without signs/symptoms of physical distress       Intensity THRR unchanged       Progression Continue progressive overload as per policy without signs/symptoms or physical distress.       Resistance Training   Training Prescription Yes       Weight 5       Reps 10-15       Interval Training   Interval Training Yes       Equipment  NuStep;Recumbant Bike       Comments NS and RB, interval for 2-3 minutes with increase in speed and level. Drop back to moderate base intensity for 3-5 minutes. Specific workloads and times written out in exercise chart.        Treadmill   MPH 3.5  speeds between 3.3-3.5 mph       Grade 4       Minutes 15       Recumbant Bike   Level 5       RPM 60       Minutes 15       NuStep   Level 7       Watts 90       Minutes 15          Discharge Exercise Prescription (Final Exercise Prescription Changes):     Exercise Prescription Changes - 12/22/14 1200    Exercise Review   Progression Yes   Response to Exercise   Symptoms None   Comments Added an incline to the TM for Zyiere due to an increase in his stamina on the treadmill and to provide another form of progression for him.    Duration Progress to 50 minutes of aerobic without signs/symptoms of physical distress   Intensity THRR unchanged   Progression Continue progressive overload as per policy without signs/symptoms or physical distress.   Resistance Training   Training Prescription Yes   Weight 5   Reps 10-15   Interval Training   Interval Training Yes   Equipment NuStep;Recumbant Bike   Comments NS and RB, interval for 2-3 minutes with increase in speed and level. Drop back to moderate base intensity for 3-5 minutes. Specific workloads and times written out in exercise chart.    Treadmill   MPH 3.5  speeds between 3.3-3.5 mph   Grade 4   Minutes 15   Recumbant Bike   Level 5   RPM 60   Minutes 15   NuStep   Level 7   Watts 90   Minutes 15       Nutrition:  Target Goals: Understanding of nutrition guidelines, daily intake of sodium <1515m, cholesterol <2073m calories 30% from fat and 7% or less from saturated fats, daily to have 5 or more servings of fruits and vegetables.  Biometrics:     Pre Biometrics - 10/19/14 1012    Pre Biometrics   Height 6' 1"  (1.854 m)   Weight 236 lb 8 oz (107.276 kg)   Waist  Circumference 43.5 inches   Hip Circumference 44 inches   Waist to Hip Ratio 0.99 %   BMI (Calculated) 31.3       Nutrition Therapy Plan and Nutrition Goals:     Nutrition Therapy & Goals - 11/20/14 1526    Nutrition Therapy   Diet Instructed patient on a 2000 calorie meal plant to promote weight loss including general dietary guidelines for lung health and DASH diet principles   Fiber 20 grams  Whole Grain Foods 3 servings   Protein 8 ounces/day   Saturated Fats 13 max. grams   Fruits and Vegetables 5 servings/day   Personal Nutrition Goals   Personal Goal #1 To work on a more consistent pattern of eating with 3 meals per day spaced 4-6 hours apart. Include only 1 evening snack vs. grazing.   Personal Goal #2 Decrease sweetened tea from 1/2 gallon per day to 1-2 (10 oz) glasses at meals.   Personal Goal #3 Try 1% milk and limit milk products to 3 per day including yogurt and cheese (no more than once daily).   Personal Goal #4 Read food labels for sodium with goal of no more than 665m sodium per meal.   Additional Goals? Yes   Personal Goal #5 Add steamed vegetables and fruit to "take out" sandwich meals vs. fries or chips.   Personal Goal #6 Increase water intake. Keep bottled water with you.      Nutrition Discharge: Rate Your Plate Scores:     Rate Your Plate - 114/48/1815631   Rate Your Plate Scores   Pre Score 55   Pre Score % 61 %      Psychosocial: Target Goals: Acknowledge presence or absence of depression, maximize coping skills, provide positive support system. Participant is able to verbalize types and ability to use techniques and skills needed for reducing stress and depression.  Initial Review & Psychosocial Screening:     Initial Psych Review & Screening - 10/17/14 1130    Family Dynamics   Good Support System? Yes   Comments Mr SSpeashas good support from his 3 children and wife. He is managing his COPD well with pacing through activities. 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   Barriers   Psychosocial barriers to participate in program There are no identifiable barriers or psychosocial needs.;The patient should benefit from training in stress management and relaxation.   Screening Interventions   Interventions Encouraged to exercise      Quality of Life Scores:     Quality of Life - 10/17/14 1130    Quality of Life Scores   Health/Function Pre 17.23 %   Socioeconomic Pre 17 %   Psych/Spiritual Pre 22.55 %   Family Pre 22.8 %   GLOBAL Pre 18.98 %      PHQ-9:     Recent Review Flowsheet Data    Depression screen PHQ 2/9 10/17/2014   Decreased Interest 1   Down, Depressed, Hopeless 0   PHQ - 2 Score 1      Psychosocial Evaluation and Intervention:     Psychosocial Evaluation - 11/06/14 1053    Psychosocial Evaluation & Interventions   Interventions Stress management education;Relaxation education;Encouraged to exercise with the program and follow exercise prescription   Comments Counselor met with Mr. SOnoratotoday for initial psychosocial evaluation.  He is a 59year old who has COPD and multiple health issues, including kidney disease, degenerative disk problems in his lower back and a blood cancer disease that is currently in remission.  Mr. SBibbeehas a strong support system with a significant other of 5 years and an adult son who lives close by.  He reports he was sleeping too much and eating too much until recently when he quit smoking, began new medication and working out, so these things have improved.  Mr. SSanedenies a history of depression or anxiety or current  symptoms.  He admitted to being very "grouchy" after he quit smoking several weeks ago, but that has improved with time.  He states he is typically in a positive mood most of the time.  Mr. Krupka reports he has some stress in his life as he is around people who smoke frequently and this is  counterproductive to his goal to quit.  Counselor worked with him on "triggers" and ways to change his lifestyle in order to promote success in this area.  His primary goal is to dance an entire song without stopping by this end of this pulmonary program.  Mr. Groene will benefit from meeting with the dietician to accomplish his weight loss and healthier eating goals.  Counselor will follow up with Mr. Sens on these goals.   Continued Psychosocial Services Needed Yes  Mr. Struckman will benefit from the psychoeducational components of this program in order to help him relax and handle the stress of quitting smoking better.  He will also benefit from seeing the dietician for weight loss goals.        Psychosocial Re-Evaluation:     Psychosocial Re-Evaluation      11/27/14 1058           Psychosocial Re-Evaluation   Comments Follow up with Mr. Garrido today reporting struggling more to breathe now that the weather is colder.  He states he was able to dance recently most of a song, although he definitely had to pace himself more than in the past.  This is an improvement and is one of his goals for this program.  Counselor commended Mr. Brumbach on this.  He admits to continuing to struggle with smoking cessation and this has "set him back" a bit from accomplishing his goals.  Discussed his plan to get back on track as well as his nutrition plan following meeting with the dietician.  Mr. Robb is aware that stress is a "trigger" for him and has alternative ways to cope without smoking.  Counselor will continue to follow with him.           Education: Education Goals: Education classes will be provided on a weekly basis, covering required topics. Participant will state understanding/return demonstration of topics presented.  Learning Barriers/Preferences:     Learning Barriers/Preferences - 10/17/14 1130    Learning Barriers/Preferences   Learning Barriers None   Learning Preferences Group  Instruction;Individual Instruction;Pictoral;Skilled Demonstration;Verbal Instruction;Video;Written Material      Education Topics: Initial Evaluation Education: - Verbal, written and demonstration of respiratory meds, RPE/PD scales, oximetry and breathing techniques. Instruction on use of nebulizers and MDIs: cleaning and proper use, rinsing mouth with steroid doses and importance of monitoring MDI activations.          Pulmonary Rehab from 12/18/2014 in [place]   Date  10/17/14   Educator  LB    Instruction Review Code  2- meets goals/outcomes      General Nutrition Guidelines/Fats and Fiber: -Group instruction provided by verbal, written material, models and posters to present the general guidelines for heart healthy nutrition. Gives an explanation and review of dietary fats and fiber.      Pulmonary Rehab from 12/18/2014 in [place]   Date  12/11/14   Educator  CR   Instruction Review Code  2- meets goals/outcomes      Controlling Sodium/Reading Food Labels: -Group verbal and written material supporting the discussion of sodium use in heart healthy nutrition.  Review and explanation with models, verbal and written materials for utilization of the food label.      Pulmonary Rehab from 12/18/2014 in [place]   Date  11/17/14   Educator  Candiss Norse    Instruction Review Code  2- meets goals/outcomes      Exercise Physiology & Risk Factors: - Group verbal and written instruction with models to review the exercise physiology of the cardiovascular system and associated critical values. Details cardiovascular disease risk factors and the goals associated with each risk factor.   Aerobic Exercise & Resistance Training: - Gives group verbal and written discussion on the health impact of inactivity. On the components of aerobic and resistive training programs  and the benefits of this training and how to safely progress through these programs.   Flexibility, Balance, General Exercise Guidelines: - Provides group verbal and written instruction on the benefits of flexibility and balance training programs. Provides general exercise guidelines with specific guidelines to those with heart or lung disease. Demonstration and skill practice provided.      Pulmonary Rehab from 12/18/2014 in [place]   Date  11/15/14   Educator  SW   Instruction Review Code  2- meets goals/outcomes      Stress Management: - Provides group verbal and written instruction about the health risks of elevated stress, cause of high stress, and healthy ways to reduce stress.      Pulmonary Rehab from 12/18/2014 in [place]   Date  11/08/14   Educator  [facility]   Instruction Review Code  2- meets goals/outcomes      Depression: - Provides group verbal and written instruction on the correlation between heart/lung disease and depressed mood, treatment options, and the stigmas associated with seeking treatment.   Exercise & Equipment Safety: - Individual verbal instruction and demonstration of equipment use and safety with use of the equipment.      Pulmonary Rehab from 12/18/2014 in [place]   Date  10/20/14   Educator  [place]   Instruction Review Code  2- meets goals/outcomes      Infection Prevention: - Provides verbal and written material to individual with discussion of infection control including proper hand washing and proper equipment cleaning during exercise session.      Pulmonary Rehab from 12/18/2014 in [place]   Date  10/20/14   Educator  [place]   Instruction Review Code  2- meets goals/outcomes      Falls Prevention: - Provides verbal and written material to individual with discussion of falls prevention and  safety.      Pulmonary Rehab from 12/18/2014 in [place]   Date  10/17/14   Educator  [place]   Instruction Review Code  2- meets goals/outcomes      Diabetes: - Individual verbal and written instruction to review signs/symptoms of diabetes, desired ranges of glucose level fasting, after meals and with exercise. Advice that pre and post exercise glucose checks will be done for 3 sessions at entry of program.   Chronic Lung Diseases: - Group verbal and written instruction to review new updates, new respiratory medications, new advancements in procedures and treatments. Provide informative websites and "800" numbers of self-education.      Pulmonary Rehab from 12/18/2014 in [place]   Date  12/15/14 [facility]]   Educator  CE   Instruction Review Code  2- meets goals/outcomes      Lung Procedures: - Group verbal and written instruction to describe testing methods done to diagnose lung disease. Review the outcome of test results. Describe the treatment choices: Pulmonary Function Tests, ABGs and oximetry.      Pulmonary Rehab from 12/18/2014 in [place]   Date  11/10/14   Educator  [place]   Instruction Review Code  2- meets goals/outcomes      Energy Conservation: - Provide group verbal and written instruction for methods to conserve energy, plan and organize activities. Instruct on pacing techniques, use of adaptive equipment and posture/positioning to relieve shortness of breath.      Pulmonary Rehab from 12/18/2014 in [place]   Date  11/22/14   Educator  SW   Instruction Review Code  2- meets goals/outcomes      Triggers: - Group verbal and written instruction to review types of environmental controls: home humidity, furnaces, filters, dust mite/pet prevention, HEPA vacuums. To discuss weather changes, air quality and  the benefits of nasal washing.   Exacerbations: - Group verbal and written instruction to provide: warning signs, infection symptoms, calling MD promptly, preventive modes, and value of vaccinations. Review: effective airway clearance, coughing and/or vibration techniques. Create an Sports administrator.      Pulmonary Rehab from 12/18/2014 in [place]   Date  12/18/14   Educator  LB   Instruction Review Code  2- meets goals/outcomes      Oxygen: - Individual and group verbal and written instruction on oxygen therapy. Includes supplement oxygen, available portable oxygen systems, continuous and intermittent flow rates, oxygen safety, concentrators, and Medicare reimbursement for oxygen.   Respiratory Medications: - Group verbal and written instruction to review medications for lung disease. Drug class, frequency, complications, importance of spacers, rinsing mouth after steroid MDI's, and proper cleaning methods for nebulizers.      Pulmonary Rehab from 12/18/2014 in [place]   Date  10/17/14   Educator  LB   Instruction Review Code  2- meets goals/outcomes      AED/CPR: - Group verbal and written instruction with the use of models to demonstrate the basic use of the AED with the basic ABC's of resuscitation.   Breathing Retraining: - Provides individuals verbal and written instruction on purpose, frequency, and proper technique of diaphragmatic breathing and pursed-lipped breathing. Applies individual practice skills.      Pulmonary Rehab from 12/18/2014 in [place]   Date  10/20/14   Educator  [place]   Instruction Review Code  2- meets goals/outcomes      Anatomy and Physiology of the Lungs: - Group verbal and written instruction with the use of models to provide basic lung anatomy and physiology related to function, structure and complications of lung  disease.   Heart Failure: - Group verbal and written instruction on the basics of heart failure: signs/symptoms, treatments, explanation of ejection fraction, enlarged heart and cardiomyopathy.      Pulmonary Rehab from 12/18/2014 in [place]   Date  10/27/14   Educator  [place]   Instruction Review Code  2- meets goals/outcomes      Sleep Apnea: - Individual verbal and written instruction to review Obstructive Sleep Apnea. Review of risk factors, methods for diagnosing and types of masks and machines for OSA.  Anxiety: - Provides group, verbal and written instruction on the correlation between heart/lung disease and anxiety, treatment options, and management of anxiety.   Relaxation: - Provides group, verbal and written instruction about the benefits of relaxation for patients with heart/lung disease. Also provides patients with examples of relaxation techniques.      Pulmonary Rehab from 12/18/2014 in [place]   Date  12/06/14   Educator  [facility]   Instruction Review Code  2- Meets goals/outcomes      Knowledge Questionnaire Score:     Knowledge Questionnaire Score - 10/17/14 1130    Knowledge Questionnaire Score   Pre Score -5      Personal Goals and Risk Factors at Admission:     Personal Goals and Risk Factors at Admission - 11/03/14 1058    Personal Goals and Risk Factors on Admission   Lipids Yes  On meds for cholesterol control.   Goal Cholesterol controlled with medications as prescribed, with individualized exercise RX and with personalized nutrition plan. Value goals: LDL < 61m, HDL > 452m Participant states understanding of desired cholesterol values and following prescriptions.   Intervention Provide nutrition & aerobic exercise along with prescribed medications to achieve LDL <7048mHDL >87m28m    Personal Goals and Risk Factors Review:      Goals and Risk Factor Review       10/27/14 1417 10/30/14 1000 11/03/14 1054 11/03/14 1059 11/03/14 1111   Weight Management   Goals Progress/Improvement seen   No     Comments   No weight change noted yet  5 sessions attended. To make appointment with RD. HAs cut down on milk consumption,and is working on prtion control. Still need uidance on calories/fat/portion control. Has started working on changing cereals from "sugar Crack" to whole grain cereals. HAs decreased sweets intake too.     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      Quit Smoking   Goals Progress/Improvement seen Yes Yes      Comments Mr. SmithProianot had a cigarette in 1 week. Mr SmithSpeirts he smokes occasionally and with stress. His trip was for legal matters and was very stressful, but he did not smoke there and states he has not had a cigarrette since his start date in [place], [place].      Breathing Techniques   Goals Progress/Improvement seen   Yes   --  Yuepheng Rishitbreathing tests done this past Wednesday at the referring MD office. He was told his numbers are improved.   Comments  Mr SmithBlodgettsing PLB with his exercise goals and activites at home and states that it is helpful for shortness of breath.      Hypertension   Goal   --  Jerryl Keldan not have HTN listed as a risk factor.     Abnormal Lipids   Progress seen towards goals    Unknown    Comments    On meds for cholesterol control. Is working on eating habits,waiting to see the RD for advice.      11/06/14 1501 11/08/14 1000 11/17/14 1240 11/17/14 1454 11/20/14 1000   Weight Management   Goals Progress/Improvement seen  No     Comments   No weight changes since last spoke with Eddie Dibbles. He has made  a few nutriotin changes. Added yogurt to his meals, continues to  decrease milk intake. Added iced tea, then decided the sugar in the tea might not be a great change. Nai finds that he grazes after dinner and ends up snacking all evening while watching TV. HE has committed to spending 2 nights a week working on a project in his shop to curb his Grazing" after dinner.  He has set a goal to lose 2 pounds in the next two weeks.     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 This is when it is difficult to not smoke when around other people are smoking.      Understand more about Heart/Pulmonary Disease   Goals Progress/Improvement seen  Yes    Yes   Comments Educated Mr Frett on his PFT and related it to his COPD. Educated him on the goal to maintain his Spirometry results, prevent them from worsening, as well as his COPD, by exercising, smoking cessation, using his MDI's, good diet, and wearing oxygen if needed.    In the past, Mr Slinger modified his activity to adjust for his shortness of breath and COPD. He loves to [place] dance and made a song that was only 66mn long. Last Friday, he was able to shang dance with his wife for 3-4 mins which was a big accomplishment and very important to him.   Improve shortness of breath with ADL's   Goals Progress/Improvement seen   Yes  Yes    Comments  Mr SStamourwas having a "bad breathing day". He had mowed the other day and inhaled dusty air. We talked about hime wearing a mask with yardwork, nasal washing after the work, and using his inhaler for rescue.  Mr. SCumbiesays he is not using as much Proventil since he has been in rehab.  He is not running out before the month's end.  He is also doing more. He is helping his son remodel an old home.    Breathing Techniques   Goals Progress/Improvement seen     Yes    Comments    Mr. SMinshallis using proper technique for PLB while on the TM.    Increase knowledge of respiratory  medications   Goals Progress/Improvement seen   Yes  Yes    Comments  Mr SSexsonhas a good understanding of his MDI's. We discussed using his Albuterol MDI before exercise in [facility] and the importance of Spiriva with exercise.  Mr. SRuggis taking Spiriva, Symbicort and Proventil MDIs.  He is rinsing his mouth after taking Spiriva and Symbicort.    Hypertension   Goal   Participant will see blood pressure controlled within the values of 140/928mHg or within value directed by their physician.     Progress seen toward goals   Yes     Comments   PAIgnatztates he is not on any medications presently for BP control. His documented BP readings are in normal range.      Abnormal Lipids   Progress seen towards goals   Unknown     Comments   HAs an appt with the RD next week. PATakeemontinues to work on chGames developerabits to better choices. Added yogurt to  his meals since last time I spoke with him.        11/27/14 1111 11/29/14 1538 12/04/14 1051 12/04/14 1611 12/06/14 1102   Weight Management   Goals Progress/Improvement seen  Yes      Comments  Savaughn has added some project time to his evenings. He helped on a project that exposed him to "asbestos" dust. He has been sick since this exposure and he was advised to call his MD if he continues to be sick. Gedalya has removed all the junk food from his house since we last spoke and has added more fruit and vegetables to his daily meals. He is down over 3 pounds since making the changes.       Increase Aerobic Exercise and Physical Activity   Goals Progress/Improvement seen  Yes  Yes  Yes   Comments Jerrelle used all his inhalers the other day so [name], RN instructed him to call his MD. Today he reported he has limited his cigarette smoking except Thursday he smoked 14 cigarettes. He has also been around asbestors.   Patient reported that walking from his car to the pulmonary rehab gym is now much easier than it was on the first day he came to rehab. He stated  that his exercise prescription is challenging, but on a good breathing day he does increase his workload slightly to continually challenge himself.   Erlin said that the treadmill is the hardest machine for him, but he has noticed his hard work on the treadmill paying off as he can walk easier and faster without illiciting symptoms. He is very encouraged by this progress and attributes it to his consistent attendance in [place].   Quit Smoking   Goals Progress/Improvement seen    Yes    Comments    Mr Merrick has not smoked since 11/27/2014, even over the Holiday's. He is very determined not to smoke and can feel a difference with his breathing since he has quit.    Improve shortness of breath with ADL's   Goals Progress/Improvement seen  No       Comments Dontavion used all his inhalers the other day so [name], RN instructed him to call his MD. Today he reported he has limited his cigarette smoking except Thursday he smoked 14 cigarettes. He has also been around asbestors.          12/13/14 1000 12/13/14 1055 12/15/14 1421 12/18/14 1516 01/02/15 0719   Weight Management   Comments     Mr Raczka has made some dietitary changes for better nutritional habits. He is maintaining his weight at 236 to 239 in [place].   Increase Aerobic Exercise and Physical Activity   Goals Progress/Improvement seen  Yes Yes      Comments Ms Stecher started an interval training session on the [place]. He has the goal to dance shag for a 3 minute song. HIs levels on the RB are 6-9 and his rpm 60-75. Discussed interval training with Eddie Dibbles. He has the goal of being able to complete a 3 minute high intensity shag dance. He will do 2-3 minute intervals on the NS and RB. He will increase in level and speed and then drop back to a moderate base intensity for 3-5 minutes.       Quit Smoking   Goals Progress/Improvement seen No   Yes    Comments Mr Busler has smoked 2 cigarrettes so far this week.   Mr Sahni only had 2 cigarettes this  weekend.  He had been with a friend and had the cigarettes. We talked about using a nicotine replacement which he does have the gum, but he wants to quit without  any nicotine substitute.    Understand more about Heart/Pulmonary Disease   Goals Progress/Improvement seen      Yes   Comments     Mr Kichline has acquired increased knowledge of COPD which has helped him manage his daily life with COPD, especially increased exercise and proper use of his inhalers.   Improve shortness of breath with ADL's   Goals Progress/Improvement seen    Yes     Comments   Deshon is still having to use his Albuterol more than directed and is running out before the end of the month.  Instructed him to call MD and notify him of this.  He is able to walk further distances and dance a little since beginning [facility] and is walking on the TM with an incline.     Breathing Techniques   Goals Progress/Improvement seen    Yes     Comments   Mr. Lady is using PLB when on the equipment in [facility], espically on the TM.     Increase knowledge of respiratory medications   Goals Progress/Improvement seen    Yes     Comments   Mr. Coggeshall is no longer taking Combivent.  He does have a spacer and using it with his proventil and symbicort.     Hypertension   Progress seen toward goals     Yes   Comments     Acceptable BP readings in [facility].   Abnormal Lipids   Progress seen towards goals     Yes   Comments     Mr Fatima is compliant with his provastatin and has made some nutritional changes.      Personal Goals Discharge (Final Personal Goals and Risk Factors Review):      Goals and Risk Factor Review - 01/02/15 0719    Weight Management   Comments Mr Musick has made some dietitary changes for better nutritional habits. He is maintaining his weight at 236 to 239 in [place].   Understand more about Heart/Pulmonary Disease   Goals Progress/Improvement seen  Yes   Comments Mr Sanon has acquired increased knowledge of COPD which has  helped him manage his daily life with COPD, especially increased exercise and proper use of his inhalers.   Hypertension   Progress seen toward goals Yes   Comments Acceptable BP readings in [facility].   Abnormal Lipids   Progress seen towards goals Yes   Comments Mr Wehner is compliant with his provastatin and has made some nutritional changes.      ITP Comments:     ITP Comments      12/13/14 1000 12/15/14 1427 12/18/14 1029 12/29/14 1401     ITP Comments Estimated timetable to achieve ITP goals is 16 more sessions. Mr. Uttech had blood work and an x-ray yesterday.  He will be out on Wednesday for an oncologist visit at [facility]. ITP goals anticipated to be met in 16 more sessions.  ITP goals anticipated to be met in 13 more sessions.       Comments: 30 day note review

## 2015-01-03 ENCOUNTER — Encounter: Payer: Medicaid Other | Admitting: *Deleted

## 2015-01-03 DIAGNOSIS — J449 Chronic obstructive pulmonary disease, unspecified: Secondary | ICD-10-CM

## 2015-01-03 NOTE — Progress Notes (Signed)
Daily Session Note  Patient Details  Name: Nathaniel [name] MRN: [id number] Date of Birth: April 06, 1955 Referring Provider:  Sharlyne Cai, NP  Encounter Date: 01/03/2015  Check In:     Session Check In - 01/03/15 1115    Check-In   Staff Present Carson Myrtle, BS, RRT, Respiratory Therapist;Steven Way, BS, ACSM EP-C, Exercise Physiologist;Adriann Ballweg Dillard Essex, MS, ACSM CEP, Exercise Physiologist   ER physicians immediately available to respond to emergencies [facility] immediately available ER MD   Physician(s) Archie Balboa and Paduchowski   Medication changes reported     No   Fall or balance concerns reported    No   Warm-up and Cool-down Performed on first and last piece of equipment   VAD Patient? No   Pain Assessment   Currently in Pain? No/denies   Multiple Pain Sites No         Goals Met:  Proper associated with RPD/PD & O2 Sat Independence with exercise equipment Using PLB without cueing & demonstrates good technique Exercise tolerated well Personal goals reviewed Strength training completed today  Goals Unmet:  Not Applicable  Goals Comments: Patient completed exercise prescription and all exercise goals during rehab session. The exercise was tolerated well and the patient is progressing in the program.    Dr. Emily Filbert is Medical Director for [place] and [facility].

## 2015-01-10 ENCOUNTER — Encounter: Payer: Medicaid Other | Attending: Nurse Practitioner

## 2015-01-10 DIAGNOSIS — J449 Chronic obstructive pulmonary disease, unspecified: Secondary | ICD-10-CM | POA: Insufficient documentation

## 2015-01-17 ENCOUNTER — Encounter: Payer: Medicaid Other | Admitting: *Deleted

## 2015-01-17 DIAGNOSIS — J449 Chronic obstructive pulmonary disease, unspecified: Secondary | ICD-10-CM | POA: Diagnosis present

## 2015-01-17 NOTE — Progress Notes (Signed)
Daily Session Note  Patient Details  Name: Nathaniel [name] MRN: [id number] Date of Birth: 04/28/1955 Referring Provider:  Sharlyne Cai, NP  Encounter Date: 01/17/2015  Check In:     Session Check In - 01/17/15 1054    Check-In   Staff Present Carson Myrtle, BS, RRT, Respiratory Therapist;Uvaldo Rybacki Dillard Essex, MS, ACSM CEP, Exercise Physiologist;Steven Way, BS, ACSM EP-C, Exercise Physiologist   ER physicians immediately available to respond to emergencies [facility] immediately available ER MD   Physician(s) Clearnce Hasten and Paduchowski   Medication changes reported     No   Fall or balance concerns reported    No   Warm-up and Cool-down Performed on first and last piece of equipment   VAD Patient? No   Pain Assessment   Currently in Pain? No/denies   Multiple Pain Sites No         Goals Met:  Proper associated with RPD/PD & O2 Sat Independence with exercise equipment Using PLB without cueing & demonstrates good technique Exercise tolerated well Strength training completed today  Goals Unmet:  Not Applicable  Goals Comments: Patient completed exercise prescription and all exercise goals during rehab session. The exercise was tolerated well and the patient is progressing in the program.    Dr. Emily Filbert is Medical Director for [place] and [facility].

## 2015-01-19 ENCOUNTER — Encounter: Payer: Medicaid Other | Admitting: *Deleted

## 2015-01-19 DIAGNOSIS — J449 Chronic obstructive pulmonary disease, unspecified: Secondary | ICD-10-CM | POA: Diagnosis not present

## 2015-01-19 NOTE — Progress Notes (Signed)
Daily Session Note  Patient Details  Name: Nathaniel [name] MRN: [id number] Date of Birth: 09-Aug-1955 Referring Provider:  Sharlyne Cai, NP  Encounter Date: 01/19/2015  Check In:     Session Check In - 01/19/15 1121    Check-In   Staff Present Candiss Norse, MS, ACSM CEP, Exercise Physiologist;Stacey Blanch Media, RRT, RCP, Respiratory Therapist;Susanne Bice, RN, BSN, [place]   ER physicians immediately available to respond to emergencies [facility] immediately available ER MD   Physician(s) Jacqualine Code and Joni Fears   Medication changes reported     No   Fall or balance concerns reported    No   Warm-up and Cool-down Performed on first and last piece of equipment   VAD Patient? No   Pain Assessment   Currently in Pain? No/denies   Multiple Pain Sites No         Goals Met:  Proper associated with RPD/PD & O2 Sat Independence with exercise equipment Using PLB without cueing & demonstrates good technique Exercise tolerated well Strength training completed today  Goals Unmet:  Not Applicable  Goals Comments: Patient completed exercise prescription and all exercise goals during rehab session. The exercise was tolerated well and the patient is progressing in the program.    Dr. Emily Filbert is Medical Director for [place] and [facility].

## 2015-01-22 ENCOUNTER — Encounter: Payer: Medicaid Other | Admitting: *Deleted

## 2015-01-22 DIAGNOSIS — J449 Chronic obstructive pulmonary disease, unspecified: Secondary | ICD-10-CM | POA: Diagnosis not present

## 2015-01-22 NOTE — Progress Notes (Signed)
Daily Session Note  Patient Details  Name: Nathaniel [name] MRN: [id number] Date of Birth: Nov 06, 1955 Referring Provider:  Ines Bloomer, MD  Encounter Date: 01/22/2015  Check In:     Session Check In - 01/22/15 1049    Check-In   Staff Present Carson Myrtle, BS, RRT, Respiratory Bertis Ruddy, BS, ACSM CEP, Exercise Physiologist;Steven Way, BS, ACSM EP-C, Exercise Physiologist   ER physicians immediately available to respond to emergencies [facility] immediately available ER MD   Physician(s) Dr. Clearnce Hasten and Marcelene Butte   Medication changes reported     No   Fall or balance concerns reported    No   Warm-up and Cool-down Performed on first and last piece of equipment   VAD Patient? No   Pain Assessment   Currently in Pain? No/denies   Multiple Pain Sites No           Exercise Prescription Changes - 01/22/15 1000    Exercise Review   Progression Yes   Response to Exercise   Symptoms None   Comments Reviewed individual exercise increases with the patient made to his exercise prescription. He was able to tolerate increased workloads with no signs or symptoms.    Duration Progress to 50 minutes of aerobic without signs/symptoms of physical distress   Intensity THRR unchanged   Progression Continue progressive overload as per policy without signs/symptoms or physical distress.   Resistance Training   Training Prescription Yes   Weight 5   Reps 10-15   Interval Training   Interval Training Yes   Equipment NuStep;Recumbant Bike   Comments NS and RB, interval for 2-3 minutes with increase in speed and level. Drop back to moderate base intensity for 3-5 minutes. Specific workloads and times written out in exercise chart.    Treadmill   MPH 3.5  speed consistantly at 3.5   Grade 4   Minutes 15   Recumbant Bike   Level 5   RPM 60   Minutes 15   NuStep   Level 7   Watts 100   Minutes 15      Goals Met:  Proper associated with RPD/PD & O2 Sat Independence with  exercise equipment Exercise tolerated well Personal goals reviewed Strength training completed today  Goals Unmet:  Not Applicable  Goals Comments: Reviewed individualized exercise prescription and made increases per departmental policy. Exercise increases were discussed with the patient and they were able to perform the new work loads without issue (no signs or symptoms).     Dr. Emily Filbert is Medical Director for [facility] and [facility].

## 2015-01-24 DIAGNOSIS — J449 Chronic obstructive pulmonary disease, unspecified: Secondary | ICD-10-CM

## 2015-01-24 NOTE — Progress Notes (Signed)
Pulmonary Individual Treatment Plan  Patient Details  Name: Nathaniel [name] MRN: [id number] Date of Birth: 1955-12-15 Referring Provider:  Ines Bloomer, MD  Initial Encounter Date:    Visit Diagnosis: COPD, moderate ([place])  Patient's Home Medications on Admission:  Current outpatient prescriptions:  .  albuterol (PROVENTIL HFA;VENTOLIN HFA) 108 (90 BASE) MCG/ACT inhaler, Inhale 2 puffs into the lungs every 6 (six) hours as needed for wheezing or shortness of breath., Disp: , Rfl:  .  albuterol (PROVENTIL HFA;VENTOLIN HFA) 108 (90 BASE) MCG/ACT inhaler, Inhale into the lungs., Disp: , Rfl:  .  budesonide-formoterol (SYMBICORT) 160-4.5 MCG/ACT inhaler, Inhale into the lungs., Disp: , Rfl:  .  COMBIVENT RESPIMAT 20-100 MCG/ACT AERS respimat, Inhale 1 puff into the lungs daily., Disp: , Rfl: 11 .  enalapril (VASOTEC) 10 MG tablet, Take 1 tablet by mouth at bedtime., Disp: , Rfl: 11 .  hydrocortisone cream 1 %, Apply 1 application topically 2 (two) times daily as needed., Disp: , Rfl:  .  Na Sulfate-K Sulfate-Mg Sulf (SUPREP BOWEL PREP) SOLN, Take 1 kit by mouth as directed., Disp: 1 Bottle, Rfl: 0 .  oxyCODONE-acetaminophen (PERCOCET/ROXICET) 5-325 MG per tablet, Take 1 tablet by mouth every 6 (six) hours as needed., Disp: , Rfl: 0 .  pravastatin (PRAVACHOL) 10 MG tablet, Take 10 mg by mouth daily., Disp: , Rfl:  .  sildenafil (VIAGRA) 100 MG tablet, Take 1 tablet by mouth as needed., Disp: , Rfl:  .  tiotropium (SPIRIVA HANDIHALER) 18 MCG inhalation capsule, Place into inhaler and inhale., Disp: , Rfl:   Past Medical History: Past Medical History  Diagnosis Date  . COPD (chronic obstructive pulmonary disease) ([place])   . Hypertension   . Chronic back pain     Dr. Quay Burow manages (per pt)  . MGUS (monoclonal gammopathy of unknown significance)   . Proteinuria   . Hearing loss   . Hyperlipidemia   . Tobacco abuse     Tobacco Use: History  Smoking status  . Current Some Day Smoker --  2.00 packs/day for 45 years  . Types: Cigarettes  Smokeless tobacco  . Never Used    Labs: Recent Review Flowsheet Data    There is no flowsheet data to display.       ADL UCSD:     ADL UCSD      10/17/14 1130       ADL UCSD   ADL Phase Entry     SOB Score total 48     Rest 1     Walk 3     Stairs 4     Bath 2     Dress 0     Shop 0         Pulmonary Function Assessment:     Pulmonary Function Assessment - 10/17/14 1130    Pulmonary Function Tests   RV% 270 %   DLCO% 67 %   Initial Spirometry Results   FVC% 45.9 %   FEV1% 23 %   FEV1/FVC Ratio 40   Post Bronchodilator Spirometry Results   FVC% 45.1 %   FEV1% 34 %   FEV1/FVC Ratio 40      Exercise Target Goals:    Exercise Program Goal: Individual exercise prescription set with THRR, safety & activity barriers. Participant demonstrates ability to understand and report RPE using BORG scale, to self-measure pulse accurately, and to acknowledge the importance of the exercise prescription.  Exercise Prescription Goal: Starting with aerobic activity 30 plus minutes a  day, 3 days per week for initial exercise prescription. Provide home exercise prescription and guidelines that participant acknowledges understanding prior to discharge.  Activity Barriers & Risk Stratification:     Activity Barriers & Risk Stratification - 10/17/14 1130    Activity Barriers & Risk Stratification   Activity Barriers Back Problems;Shortness of Breath;Deconditioning;Muscular Weakness   Risk Stratification Moderate      6 Minute Walk:     6 Minute Walk      10/19/14 1007 12/11/14 1053     6 Minute Walk   Phase Initial Mid Program    Distance 1340 feet 1500 feet    Walk Time 6 minutes 6 minutes    Resting HR 81 bpm 77 bpm    Resting BP 122/64 mmHg 128/72 mmHg    Max Ex. HR 91 bpm 101 bpm    Max Ex. BP 142/62 mmHg 138/82 mmHg    RPE 9 13    Perceived Dyspnea  2 3    Symptoms No No       Initial Exercise  Prescription:     Initial Exercise Prescription - 10/19/14 1000    Date of Initial Exercise Prescription   Date 10/17/14   Treadmill   MPH 2.5   Grade 0   Minutes 10   Recumbant Bike   Level 2   RPM 40   Watts 20   Minutes 10   NuStep   Level 3   Watts 50   Minutes 10   Arm Ergometer   Level 1   Watts 10   Minutes 10   Recumbant Elliptical   Level 1   RPM 40   Watts 20   Minutes 10   Elliptical   Level 1   Speed 3   Minutes 1   REL-XR   Level 3   Watts 50   Minutes 10   Prescription Details   Frequency (times per week) 3   Duration Progress to 30 minutes of continuous aerobic without signs/symptoms of physical distress   Intensity   THRR REST +  30   Ratings of Perceived Exertion 11-15   Perceived Dyspnea 2-4   Progression Continue progressive overload as per policy without signs/symptoms or physical distress.   Resistance Training   Training Prescription Yes   Weight 2   Reps 10-15      Exercise Prescription Changes:     Exercise Prescription Changes      10/20/14 1100 10/27/14 1000 10/30/14 1100 11/03/14 1100 11/06/14 1000   Exercise Review   Progression  Yes Yes Yes Yes   Response to Exercise   Blood Pressure (Admit) 122/70 mmHg       Blood Pressure (Exercise) 142/90 mmHg       Blood Pressure (Exit) 116/78 mmHg       Heart Rate (Admit) 78 bpm       Heart Rate (Exercise) 114 bpm       Heart Rate (Exit) 88 bpm       Oxygen Saturation (Admit) 97 %       Oxygen Saturation (Exercise) 96 %       Oxygen Saturation (Exit) 97 %       Rating of Perceived Exertion (Exercise) 13       Perceived Dyspnea (Exercise) 3       Symptoms _0    Comments First day of exercise! Patient was oriented to the gym and the equipment functions and settings. Procedures and policies of  the gym were outlined and explained. The patient's individual exercise prescription and treatment plan were reviewed with them. All starting workloads were established  based on the results of the functional testing  done at the initial intake visit. The plan for exercise progression was also introduced and progression will be customized based on the patient's performance and goals.  Reviewed individualized exercise prescription and made increases per departmental policy. Exercise increases were discussed with the patient and they were able to perform the new work loads without issue (no signs or symptoms).   Dolan says that he has not had a cigarette in 1 week. Reviewed individualized exercise prescription and made increases per departmental policy. Exercise increases were discussed with the patient and they were able to perform the new work loads without issue (no signs or symptoms).  Reviewed individualized exercise prescription and made increases per departmental policy. Exercise increases were discussed with the patient and they were able to perform the new work loads without issue (no signs or symptoms).  Rishard was given increases in intensity on the TM, NS, and RB today. Reviewed individualized exercise prescription and made increases per departmental policy. Exercise increases were discussed with the patient and they were able to perform the new work loads without issue (no signs or symptoms).    Duration Progress to 30 minutes of continuous aerobic without signs/symptoms of physical distress Progress to 30 minutes of continuous aerobic without signs/symptoms of physical distress Progress to 30 minutes of continuous aerobic without signs/symptoms of physical distress Progress to 30 minutes of continuous aerobic without signs/symptoms of physical distress Progress to 30 minutes of continuous aerobic without signs/symptoms of physical distress   Intensity Rest + 30 Rest + 30 Rest + 30 Rest + 30 Rest + 30   Progression Continue progressive overload as per policy without signs/symptoms or physical distress. Continue progressive overload as per policy without signs/symptoms or  physical distress. Continue progressive overload as per policy without signs/symptoms or physical distress. Continue progressive overload as per policy without signs/symptoms or physical distress. Continue progressive overload as per policy without signs/symptoms or physical distress.   Resistance Training   Training Prescription _0    Weight _1 Reps 10-12 10-12 10-12 10-12 10-12   Interval Training   Interval Training _2    Treadmill   MPH 2.6 2.7 2.7 2.7 3   Grade 0 0 0 0 0   Minutes _3 Recumbant Bike   Level 3.5 3.5 3.5 3.5 4   RPM 50 50 50 50 60   Minutes _4 NuStep   Level _5 Watts 50 50 50 50 50   Minutes _6 11/08/14 1000 11/13/14 1400 11/15/14 1000 11/17/14 1100 11/22/14 1000   Exercise Review   Progression _7    Response to Exercise   Blood Pressure (Admit)  120/64 mmHg      Blood Pressure (Exercise)  160/80 mmHg      Blood Pressure (Exit)  122/66 mmHg      Heart Rate (Admit)  85 bpm      Heart Rate (Exercise)  129 bpm      Heart Rate (Exit)  101 bpm      Oxygen Saturation (Admit)  96 %      Oxygen Saturation (Exercise)  93 %      Oxygen Saturation (Exit)  94 %      Rating of Perceived Exertion (Exercise)  14      Perceived Dyspnea (Exercise)  4      Symptoms None None   None   Comments Masayoshi was given increases in intensity on the TM, NS, and RB today. Reviewed individualized exercise prescription and made increases per departmental policy. Exercise increases were discussed with the patient and they were able to perform the new work loads without issue (no signs or symptoms).  Balian was given increases in intensity on the TM, NS, and RB today. Reviewed individualized exercise prescription and made increases per departmental policy. Exercise increases were discussed with the patient and they were able to perform the new work loads without issue (no signs or symptoms).     Reviewed individualized exercise prescription and made increases per departmental policy. Exercise increases were discussed with the patient and they were able to perform the new work loads without issue (no signs or symptoms).    Duration Progress to 30 minutes of continuous aerobic without signs/symptoms of physical distress Progress to 30 minutes of continuous aerobic without signs/symptoms of physical distress Progress to 30 minutes of continuous aerobic without signs/symptoms of physical distress Progress to 30 minutes of continuous aerobic without signs/symptoms of physical distress Progress to 30 minutes of continuous aerobic without signs/symptoms of physical distress   Intensity Rest + 30 Rest + 30 Rest + 30 Rest + 30 Rest + 30   Progression Continue progressive overload as per policy without signs/symptoms or physical distress. Continue progressive overload as per policy without signs/symptoms or physical distress. Continue progressive overload as per policy without signs/symptoms or physical distress. Continue progressive overload as per policy without signs/symptoms or physical distress. Continue progressive overload as per policy without signs/symptoms or physical distress.   Resistance Training   Training Prescription _0    Weight _1 Reps 10-12 10-12 10-12 10-12 10-12   Interval Training   Interval Training _2    Treadmill   MPH 3 3.3 3.3 3.3 3.3   Grade _3 Minutes _4 Recumbant Bike   Level _5 RPM 60 60 60 60 60   Minutes _6 NuStep   Level _7 Watts 80 80 80 80 70  80W was for the older style NS, Sair is now on newer style   Minutes _8 12/04/14 1636 12/11/14 1100 12/13/14 1000 12/15/14 1000 12/18/14 1000   Exercise Review   Progression _9    Response to Exercise   Blood Pressure (Admit) 124/70 mmHg 128/72 mmHg      Blood Pressure (Exercise) 150/70 mmHg  138/82 mmHg      Blood Pressure (Exit) 122/80 mmHg       Heart Rate (Admit) 86 bpm 77 bpm      Heart Rate (Exercise) 111 bpm 108 bpm      Heart Rate (Exit) 103 bpm       Oxygen Saturation (Admit) 96 % 97 %      Oxygen Saturation (Exercise) 95 % 94 %      Oxygen Saturation (Exit) 94 %       Rating of Perceived Exertion (  Exercise) 13 15      Perceived Dyspnea (Exercise) 4 4      Symptoms _0    Comments   Wardell Honour with regular high intensity interval training. He was educated on proper HIIT training and how to progress with it.  Reviewed individualized exercise prescription and made increases per departmental policy. Exercise increases were discussed with the patient and they were able to perform the new work loads without issue (no signs or symptoms).  Reviewed individualized exercise prescription and made increases per departmental policy. Exercise increases were discussed with the patient and they were able to perform the new work loads without issue (no signs or symptoms).    Duration Progress to 30 minutes of continuous aerobic without signs/symptoms of physical distress Progress to 50 minutes of aerobic without signs/symptoms of physical distress Progress to 50 minutes of aerobic without signs/symptoms of physical distress Progress to 50 minutes of aerobic without signs/symptoms of physical distress Progress to 50 minutes of aerobic without signs/symptoms of physical distress   Intensity Rest + 30 THRR unchanged THRR unchanged THRR unchanged THRR unchanged   Progression Continue progressive overload as per policy without signs/symptoms or physical distress. Continue progressive overload as per policy without signs/symptoms or physical distress. Continue progressive overload as per policy without signs/symptoms or physical distress. Continue progressive overload as per policy without signs/symptoms or physical distress. Continue progressive overload as per policy without  signs/symptoms or physical distress.   Resistance Training   Training Prescription _1    Weight _2 Reps 10-12 10-15 10-15 10-15 10-15   Interval Training   Interval Training No No Yes Yes Yes   Equipment   NuStep;Recumbant Bike NuStep;Recumbant Bike NuStep;Recumbant Bike   Comments   NS and RB, interval for 2-3 minutes with increase in speed and level. Drop back to moderate base intensity for 3-5 minutes. Specific workloads and times written out in exercise chart.  NS and RB, interval for 2-3 minutes with increase in speed and level. Drop back to moderate base intensity for 3-5 minutes. Specific workloads and times written out in exercise chart.  NS and RB, interval for 2-3 minutes with increase in speed and level. Drop back to moderate base intensity for 3-5 minutes. Specific workloads and times written out in exercise chart.    Treadmill   MPH 3.3 3.3 3.3 3.3 3.5  speeds between 3.3-3.5 mph   Grade _3 Minutes _4 Recumbant Bike   Level _5 RPM 60 60 60 60 60   Minutes _6 NuStep   Level _7 Watts 70  80W was for the older style NS, Caley is now on newer style 92 92 90 90   Minutes _8 12/22/14 1200 01/22/15 1000 01/22/15 1316       Exercise Review   Progression Yes Yes Yes     Response to Exercise   Blood Pressure (Admit)   126/78 mmHg     Blood Pressure (Exercise)   150/80 mmHg     Blood Pressure (Exit)   108/80 mmHg     Heart Rate (Admit)   84 bpm     Heart Rate (Exercise)   126 bpm     Heart Rate (Exit)   101  bpm     Oxygen Saturation (Admit)   97 %     Oxygen Saturation (Exercise)   93 %     Oxygen Saturation (Exit)   95 %     Rating of Perceived Exertion (Exercise)   15     Perceived Dyspnea (Exercise)   4     Symptoms None None None     Comments Added an incline to the TM for Deweese due to an increase in his stamina on the treadmill and to provide another form of progression for  him.  Reviewed individual exercise increases with the patient made to his exercise prescription. He was able to tolerate increased workloads with no signs or symptoms.       Duration Progress to 50 minutes of aerobic without signs/symptoms of physical distress Progress to 50 minutes of aerobic without signs/symptoms of physical distress Progress to 50 minutes of aerobic without signs/symptoms of physical distress     Intensity THRR unchanged THRR unchanged THRR unchanged     Progression Continue progressive overload as per policy without signs/symptoms or physical distress. Continue progressive overload as per policy without signs/symptoms or physical distress. Continue progressive overload as per policy without signs/symptoms or physical distress.     Resistance Training   Training Prescription Yes Yes Yes     Weight _0 Reps 10-15 10-15 10-15     Interval Training   Interval Training Yes Yes Yes     Equipment NuStep;Recumbant Bike NuStep;Recumbant Bike NuStep;Recumbant Bike     Comments NS and RB, interval for 2-3 minutes with increase in speed and level. Drop back to moderate base intensity for 3-5 minutes. Specific workloads and times written out in exercise chart.  NS and RB, interval for 2-3 minutes with increase in speed and level. Drop back to moderate base intensity for 3-5 minutes. Specific workloads and times written out in exercise chart.  NS and RB, interval for 2-3 minutes with increase in speed and level. Drop back to moderate base intensity for 3-5 minutes. Specific workloads and times written out in exercise chart.      Treadmill   MPH 3.5  speeds between 3.3-3.5 mph 3.5  speed consistantly at 3.5 3.5  speed consistantly at 3.5     Grade _1 Minutes _2 Recumbant Bike   Level _3 RPM 60 60 60     Minutes _4 NuStep   Level _5 Watts 90 100 100     Minutes _6 Discharge Exercise Prescription (Final Exercise  Prescription Changes):     Exercise Prescription Changes - 01/22/15 1316    Exercise Review   Progression Yes   Response to Exercise   Blood Pressure (Admit) 126/78 mmHg   Blood Pressure (Exercise) 150/80 mmHg   Blood Pressure (Exit) 108/80 mmHg   Heart Rate (Admit) 84 bpm   Heart Rate (Exercise) 126 bpm   Heart Rate (Exit) 101 bpm   Oxygen Saturation (Admit) 97 %   Oxygen Saturation (Exercise) 93 %   Oxygen Saturation (Exit) 95 %   Rating of Perceived Exertion (Exercise) 15   Perceived Dyspnea (Exercise) 4   Symptoms None   Duration Progress to 50 minutes of aerobic without signs/symptoms of physical distress   Intensity THRR  unchanged   Progression Continue progressive overload as per policy without signs/symptoms or physical distress.   Resistance Training   Training Prescription Yes   Weight 5   Reps 10-15   Interval Training   Interval Training Yes   Equipment NuStep;Recumbant Bike   Comments NS and RB, interval for 2-3 minutes with increase in speed and level. Drop back to moderate base intensity for 3-5 minutes. Specific workloads and times written out in exercise chart.    Treadmill   MPH 3.5  speed consistantly at 3.5   Grade 4   Minutes 15   Recumbant Bike   Level 5   RPM 60   Minutes 15   NuStep   Level 7   Watts 100   Minutes 15       Nutrition:  Target Goals: Understanding of nutrition guidelines, daily intake of sodium <1520m, cholesterol <2074m calories 30% from fat and 7% or less from saturated fats, daily to have 5 or more servings of fruits and vegetables.  Biometrics:     Pre Biometrics - 10/19/14 1012    Pre Biometrics   Height 6' 1" (1.854 m)   Weight 236 lb 8 oz (107.276 kg)   Waist Circumference 43.5 inches   Hip Circumference 44 inches   Waist to Hip Ratio 0.99 %   BMI (Calculated) 31.3       Nutrition Therapy Plan and Nutrition Goals:     Nutrition Therapy & Goals - 11/20/14 1526    Nutrition Therapy   Diet Instructed  patient on a 2000 calorie meal plant to promote weight loss including general dietary guidelines for lung health and DASH diet principles   Fiber 20 grams   Whole Grain Foods 3 servings   Protein 8 ounces/day   Saturated Fats 13 max. grams   Fruits and Vegetables 5 servings/day   Personal Nutrition Goals   Personal Goal #1 To work on a more consistent pattern of eating with 3 meals per day spaced 4-6 hours apart. Include only 1 evening snack vs. grazing.   Personal Goal #2 Decrease sweetened tea from 1/2 gallon per day to 1-2 (10 oz) glasses at meals.   Personal Goal #3 Try 1% milk and limit milk products to 3 per day including yogurt and cheese (no more than once daily).   Personal Goal #4 Read food labels for sodium with goal of no more than 60084modium per meal.   Additional Goals? Yes   Personal Goal #5 Add steamed vegetables and fruit to "take out" sandwich meals vs. fries or chips.   Personal Goal #6 Increase water intake. Keep bottled water with you.      Nutrition Discharge: Rate Your Plate Scores:     Rate Your Plate - 11/08/52/6536812 Rate Your Plate Scores   Pre Score 55   Pre Score % 61 %      Psychosocial: Target Goals: Acknowledge presence or absence of depression, maximize coping skills, provide positive support system. Participant is able to verbalize types and ability to use techniques and skills needed for reducing stress and depression.  Initial Review & Psychosocial Screening:     Initial Psych Review & Screening - 10/17/14 1130    Family Dynamics   Good Support System? Yes   Comments Mr SmiHandss good support from his 3 children and wife. He is managing his COPD well with pacing through activities. 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   Barriers   Psychosocial barriers to participate in program There are no identifiable barriers or psychosocial needs.;The patient should benefit from  training in stress management and relaxation.   Screening Interventions   Interventions Encouraged to exercise      Quality of Life Scores:     Quality of Life - 10/17/14 1130    Quality of Life Scores   Health/Function Pre 17.23 %   Socioeconomic Pre 17 %   Psych/Spiritual Pre 22.55 %   Family Pre 22.8 %   GLOBAL Pre 18.98 %      PHQ-9:     Recent Review Flowsheet Data    Depression screen PHQ 2/9 10/17/2014   Decreased Interest 1   Down, Depressed, Hopeless 0   PHQ - 2 Score 1      Psychosocial Evaluation and Intervention:     Psychosocial Evaluation - 11/06/14 1053    Psychosocial Evaluation & Interventions   Interventions Stress management education;Relaxation education;Encouraged to exercise with the program and follow exercise prescription   Comments Counselor met with Mr. Rothman today for initial psychosocial evaluation.  He is a 60 year old who has COPD and multiple health issues, including kidney disease, degenerative disk problems in his lower back and a blood cancer disease that is currently in remission.  Mr. Errico has a strong support system with a significant other of 5 years and an adult son who lives close by.  He reports he was sleeping too much and eating too much until recently when he quit smoking, began new medication and working out, so these things have improved.  Mr. Westmoreland denies a history of depression or anxiety or current symptoms.  He admitted to being very "grouchy" after he quit smoking several weeks ago, but that has improved with time.  He states he is typically in a positive mood most of the time.  Mr. Dunlevy reports he has some stress in his life as he is around people who smoke frequently and this is counterproductive to his goal to quit.  Counselor worked with him on "triggers" and ways to change his lifestyle in order to promote success in this area.  His primary goal is to dance an entire song without stopping by this end of this pulmonary  program.  Mr. Stumpe will benefit from meeting with the dietician to accomplish his weight loss and healthier eating goals.  Counselor will follow up with Mr. Theard on these goals.   Continued Psychosocial Services Needed Yes  Mr. Hallum will benefit from the psychoeducational components of this program in order to help him relax and handle the stress of quitting smoking better.  He will also benefit from seeing the dietician for weight loss goals.        Psychosocial Re-Evaluation:     Psychosocial Re-Evaluation      11/27/14 1058 01/17/15 1128         Psychosocial Re-Evaluation   Comments Follow up with Mr. Aliano today reporting struggling more to breathe now that the weather is colder.  He states he was able to dance recently most of a song, although he definitely had to pace himself more than in the past.  This is an improvement and is one of his goals for this program.  Counselor commended Mr. Boylen on this.  He admits to continuing to struggle with smoking cessation and this has "set him back" a bit from accomplishing his goals.  Discussed his plan  to get back on track as well as his nutrition plan following meeting with the dietician.  Mr. Bakken is aware that stress is a "trigger" for him and has alternative ways to cope without smoking.  Counselor will continue to follow with him.   Follow up with Mr. Yamashiro today reporting been out of class awhile due to sickness and a death recently of a close friend.  Mr. Gastineau reports he continues to struggle with smoking - mostly when he is in social situations.  He continues to go dancing on Friday nights and has noticed improved stamina and energy.  Counselor discussed ways to help with social pressure to smoke and Mr. Bailly agreed that the "gum" had helped but he was out of the  Rx.  He was encouraged to get that filled in order to promote success in this area.  Counselor will follow as needed.         Education: Education Goals: Education classes  will be provided on a weekly basis, covering required topics. Participant will state understanding/return demonstration of topics presented.  Learning Barriers/Preferences:     Learning Barriers/Preferences - 10/17/14 1130    Learning Barriers/Preferences   Learning Barriers None   Learning Preferences Group Instruction;Individual Instruction;Pictoral;Skilled Demonstration;Verbal Instruction;Video;Written Material      Education Topics: Initial Evaluation Education: - Verbal, written and demonstration of respiratory meds, RPE/PD scales, oximetry and breathing techniques. Instruction on use of nebulizers and MDIs: cleaning and proper use, rinsing mouth with steroid doses and importance of monitoring MDI activations.          Pulmonary Rehab from 01/22/2015 in [facility] Cardiac and Pulmonary Rehab   Date  10/17/14   Educator  LB    Instruction Review Code  2- meets goals/outcomes      General Nutrition Guidelines/Fats and Fiber: -Group instruction provided by verbal, written material, models and posters to present the general guidelines for heart healthy nutrition. Gives an explanation and review of dietary fats and fiber.      Pulmonary Rehab from 01/22/2015 in [facility] Cardiac and Pulmonary Rehab   Date  12/11/14   Educator  CR   Instruction Review Code  2- meets goals/outcomes      Controlling Sodium/Reading Food Labels: -Group verbal and written material supporting the discussion of sodium use in heart healthy nutrition. Review and explanation with models, verbal and written materials for utilization of the food label.      Pulmonary Rehab from 01/22/2015 in [facility] Cardiac and Pulmonary Rehab   Date  11/17/14   Educator  Candiss Norse    Instruction Review Code  2- meets goals/outcomes      Exercise Physiology & Risk Factors: - Group verbal and written instruction with models to review the exercise physiology of the cardiovascular system and associated critical values. Details  cardiovascular disease risk factors and the goals associated with each risk factor.   Aerobic Exercise & Resistance Training: - Gives group verbal and written discussion on the health impact of inactivity. On the components of aerobic and resistive training programs and the benefits of this training and how to safely progress through these programs.   Flexibility, Balance, General Exercise Guidelines: - Provides group verbal and written instruction on the benefits of flexibility and balance training programs. Provides general exercise guidelines with specific guidelines to those with heart or lung disease. Demonstration and skill practice provided.      Pulmonary Rehab from 01/22/2015 in [facility] Cardiac and Pulmonary Rehab   Date  11/15/14  Educator  SW   Instruction Review Code  2- meets goals/outcomes      Stress Management: - Provides group verbal and written instruction about the health risks of elevated stress, cause of high stress, and healthy ways to reduce stress.      Pulmonary Rehab from 01/22/2015 in [facility] Cardiac and Pulmonary Rehab   Date  11/08/14   Educator  [facility]   Instruction Review Code  2- meets goals/outcomes      Depression: - Provides group verbal and written instruction on the correlation between heart/lung disease and depressed mood, treatment options, and the stigmas associated with seeking treatment.      Pulmonary Rehab from 01/22/2015 in [facility] Cardiac and Pulmonary Rehab   Date  01/17/15 Marcelina Morel attended 01/03/15]   Educator  [facility]   Instruction Review Code  2- meets goals/outcomes      Exercise & Equipment Safety: - Individual verbal instruction and demonstration of equipment use and safety with use of the equipment.      Pulmonary Rehab from 01/22/2015 in [facility] Cardiac and Pulmonary Rehab   Date  10/20/14   Educator  [place]   Instruction Review Code  2- meets goals/outcomes      Infection Prevention: - Provides verbal and written material to individual with  discussion of infection control including proper hand washing and proper equipment cleaning during exercise session.      Pulmonary Rehab from 01/22/2015 in [facility] Cardiac and Pulmonary Rehab   Date  10/20/14   Educator  [place]   Instruction Review Code  2- meets goals/outcomes      Falls Prevention: - Provides verbal and written material to individual with discussion of falls prevention and safety.      Pulmonary Rehab from 01/22/2015 in [facility] Cardiac and Pulmonary Rehab   Date  10/17/14   Educator  LB   Instruction Review Code  2- meets goals/outcomes      Diabetes: - Individual verbal and written instruction to review signs/symptoms of diabetes, desired ranges of glucose level fasting, after meals and with exercise. Advice that pre and post exercise glucose checks will be done for 3 sessions at entry of program.   Chronic Lung Diseases: - Group verbal and written instruction to review new updates, new respiratory medications, new advancements in procedures and treatments. Provide informative websites and "800" numbers of self-education.      Pulmonary Rehab from 01/22/2015 in [facility] Cardiac and Pulmonary Rehab   Date  12/15/14 [facility]]   Educator  CE   Instruction Review Code  2- meets goals/outcomes      Lung Procedures: - Group verbal and written instruction to describe testing methods done to diagnose lung disease. Review the outcome of test results. Describe the treatment choices: Pulmonary Function Tests, ABGs and oximetry.      Pulmonary Rehab from 01/22/2015 in [facility] Cardiac and Pulmonary Rehab   Date  11/10/14   Educator  [place]   Instruction Review Code  2- meets goals/outcomes      Energy Conservation: - Provide group verbal and written instruction for methods to conserve energy, plan and organize activities. Instruct on pacing techniques, use of adaptive equipment and posture/positioning to relieve shortness of breath.      Pulmonary Rehab from 01/22/2015 in [facility]  Cardiac and Pulmonary Rehab   Date  11/22/14   Educator  SW   Instruction Review Code  2- meets goals/outcomes      Triggers: - Group verbal and written instruction to review  types of environmental controls: home humidity, furnaces, filters, dust mite/pet prevention, HEPA vacuums. To discuss weather changes, air quality and the benefits of nasal washing.      Pulmonary Rehab from 01/22/2015 in [facility] Cardiac and Pulmonary Rehab   Date  01/22/15   Educator  LB   Instruction Review Code  2- meets goals/outcomes      Exacerbations: - Group verbal and written instruction to provide: warning signs, infection symptoms, calling MD promptly, preventive modes, and value of vaccinations. Review: effective airway clearance, coughing and/or vibration techniques. Create an Sports administrator.      Pulmonary Rehab from 01/22/2015 in [facility] Cardiac and Pulmonary Rehab   Date  12/18/14   Educator  LB   Instruction Review Code  2- meets goals/outcomes      Oxygen: - Individual and group verbal and written instruction on oxygen therapy. Includes supplement oxygen, available portable oxygen systems, continuous and intermittent flow rates, oxygen safety, concentrators, and Medicare reimbursement for oxygen.   Respiratory Medications: - Group verbal and written instruction to review medications for lung disease. Drug class, frequency, complications, importance of spacers, rinsing mouth after steroid MDI's, and proper cleaning methods for nebulizers.      Pulmonary Rehab from 01/22/2015 in [facility] Cardiac and Pulmonary Rehab   Date  10/17/14   Educator  LB   Instruction Review Code  2- meets goals/outcomes      AED/CPR: - Group verbal and written instruction with the use of models to demonstrate the basic use of the AED with the basic ABC's of resuscitation.   Breathing Retraining: - Provides individuals verbal and written instruction on purpose, frequency, and proper technique of diaphragmatic breathing and  pursed-lipped breathing. Applies individual practice skills.      Pulmonary Rehab from 01/22/2015 in [facility] Cardiac and Pulmonary Rehab   Date  10/20/14   Educator  [place]   Instruction Review Code  2- meets goals/outcomes      Anatomy and Physiology of the Lungs: - Group verbal and written instruction with the use of models to provide basic lung anatomy and physiology related to function, structure and complications of lung disease.   Heart Failure: - Group verbal and written instruction on the basics of heart failure: signs/symptoms, treatments, explanation of ejection fraction, enlarged heart and cardiomyopathy.      Pulmonary Rehab from 01/22/2015 in [facility] Cardiac and Pulmonary Rehab   Date  10/27/14   Educator  [place]   Instruction Review Code  2- meets goals/outcomes      Sleep Apnea: - Individual verbal and written instruction to review Obstructive Sleep Apnea. Review of risk factors, methods for diagnosing and types of masks and machines for OSA.   Anxiety: - Provides group, verbal and written instruction on the correlation between heart/lung disease and anxiety, treatment options, and management of anxiety.   Relaxation: - Provides group, verbal and written instruction about the benefits of relaxation for patients with heart/lung disease. Also provides patients with examples of relaxation techniques.      Pulmonary Rehab from 01/22/2015 in [facility] Cardiac and Pulmonary Rehab   Date  12/06/14   Educator  [facility]   Instruction Review Code  2- Meets goals/outcomes      Knowledge Questionnaire Score:     Knowledge Questionnaire Score - 10/17/14 1130    Knowledge Questionnaire Score   Pre Score -5      Personal Goals and Risk Factors at Admission:     Personal Goals and Risk Factors at Admission -  11/03/14 1058    Personal Goals and Risk Factors on Admission   Lipids Yes  On meds for cholesterol control.   Goal Cholesterol controlled with medications as prescribed, with  individualized exercise RX and with personalized nutrition plan. Value goals: LDL < 10m, HDL > 485m Participant states understanding of desired cholesterol values and following prescriptions.   Intervention Provide nutrition & aerobic exercise along with prescribed medications to achieve LDL <7061mHDL >64m48m    Personal Goals and Risk Factors Review:      Goals and Risk Factor Review      10/27/14 1417 10/30/14 1000 11/03/14 1054 11/03/14 1059 11/03/14 1111   Weight Management   Goals Progress/Improvement seen   No     Comments   No weight change noted yet  5 sessions attended. To make appointment with RD. HAs cut down on milk consumption,and is working on prtion control. Still need uidance on calories/fat/portion control. Has started working on changing cereals from "sugar Crack" to whole grain cereals. HAs decreased sweets intake too.     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      Quit Smoking   Goals Progress/Improvement seen Yes Yes      Comments Mr. SmithDhananinot had a cigarette in 1 week. Mr SmithWhalints he smokes occasionally and with stress. His trip was for legal matters and was very stressful, but he did not smoke there and states he has not had a cigarrette since his start date in [place], [place].      Breathing Techniques   Goals Progress/Improvement seen   Yes   --  Ladainian Travarisbreathing tests done this past Wednesday at the referring MD office. He was told his numbers are improved.   Comments  Mr SmithArthsing PLB with his exercise goals and activites at home and states that it is helpful for shortness of breath.      Hypertension   Goal   --  Obaloluwa Rondy not have HTN listed as a risk factor.     Abnormal Lipids   Progress seen towards  goals    Unknown    Comments    On meds for cholesterol control. Is working on eating habits,waiting to see the RD for advice.      11/06/14 1501 11/08/14 1000 11/17/14 1240 11/17/14 1454 11/20/14 1000   Weight Management   Goals Progress/Improvement seen   No     Comments   No weight changes since last spoke with Hector.Eddie Dibbleshas made  a few nutriotin changes. Added yogurt to his meals, continues to decrease milk intake. Added iced tea, then decided the sugar in the tea might not be a great change. Declin Azarions that he grazes after dinner and ends up snacking all evening while watching TV. HE has committed to spending 2 nights a week working on a project in his shop to curb his Grazing" after dinner.  He has set a goal to lose 2 pounds in the next two weeks.     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 This is when it is difficult to not smoke when around other people are smoking.      Understand more about Heart/Pulmonary Disease   Goals Progress/Improvement seen  Yes    Yes   Comments Educated Mr Moronta on his PFT and related it to his COPD. Educated him on the goal to maintain his Spirometry results, prevent them from worsening, as well as his COPD, by exercising, smoking cessation, using his MDI's, good diet, and wearing oxygen if needed.    In the past, Mr Marling modified his activity to adjust for his shortness of breath and COPD. He loves to [place] dance and made a song that was only 71mn long. Last Friday, he was able to shang dance with his wife for 3-4 mins which was a big accomplishment and very important to him.   Improve shortness of breath with ADL's   Goals Progress/Improvement seen   Yes  Yes    Comments  Mr SKauwas having a "bad breathing day". He had mowed the other day and inhaled dusty air. We talked about hime wearing a mask with yardwork, nasal washing after the work, and  using his inhaler for rescue.  Mr. SMeckelsays he is not using as much Proventil since he has been in rehab.  He is not running out before the month's end.  He is also doing more. He is helping his son remodel an old home.    Breathing Techniques   Goals Progress/Improvement seen     Yes    Comments    Mr. SStithis using proper technique for PLB while on the TM.    Increase knowledge of respiratory medications   Goals Progress/Improvement seen   Yes  Yes    Comments  Mr SMuchahas a good understanding of his MDI's. We discussed using his Albuterol MDI before exercise in [facility] and the importance of Spiriva with exercise.  Mr. SBrewis taking Spiriva, Symbicort and Proventil MDIs.  He is rinsing his mouth after taking Spiriva and Symbicort.    Hypertension   Goal   Participant will see blood pressure controlled within the values of 140/918mHg or within value directed by their physician.     Progress seen toward goals   Yes     Comments   PAJenkinstates he is not on any medications presently for BP control. His documented BP readings are in normal range.      Abnormal Lipids   Progress seen towards goals   Unknown     Comments   HAs an appt with the RD next week. PAPavleontinues to work on chGames developerabits to better choices. Added yogurt to his meals since last time I spoke with him.        11/27/14 1111 11/29/14 1538 12/04/14 1051 12/04/14 1611 12/06/14 1102   Weight Management   Goals Progress/Improvement seen  Yes      Comments  Teddy has added some project time to his evenings. He helped on a project that exposed him to "asbestos" dust. He has been sick since this exposure and he was advised to call his MD if he continues to be sick. PaObbieas removed all the junk food from his house since we last spoke and has added more fruit and vegetables to his daily meals. He is down over 3 pounds since making the changes.       Increase Aerobic Exercise and Physical Activity   Goals  Progress/Improvement seen  Yes  Yes  Yes   Comments Kolt used all his inhalers the other day so [name], RN instructed him to call his MD. Today he reported he has limited his cigarette smoking except Thursday he smoked 14 cigarettes. He has also been around asbestors.   Patient reported that walking from his car to the pulmonary rehab gym is now much easier than it was on the first day he came to rehab. He stated that his exercise prescription is challenging, but on a good breathing day he does increase his workload slightly to continually challenge himself.   Fuquan said that the treadmill is the hardest machine for him, but he has noticed his hard work on the treadmill paying off as he can walk easier and faster without illiciting symptoms. He is very encouraged by this progress and attributes it to his consistent attendance in [place].   Quit Smoking   Goals Progress/Improvement seen    Yes    Comments    Mr Samford has not smoked since 11/27/2014, even over the Holiday's. He is very determined not to smoke and can feel a difference with his breathing since he has quit.    Improve shortness of breath with ADL's   Goals Progress/Improvement seen  No       Comments [place] used all his inhalers the other day so [name], RN instructed him to call his MD. Today he reported he has limited his cigarette smoking except Thursday he smoked 14 cigarettes. He has also been around asbestors.          12/13/14 1000 12/13/14 1055 12/15/14 1421 12/18/14 1516 01/02/15 0719   Weight Management   Comments     Mr Barmore has made some dietitary changes for better nutritional habits. He is maintaining his weight at 236 to 239 in [place].   Increase Aerobic Exercise and Physical Activity   Goals Progress/Improvement seen  Yes Yes      Comments Ms Tinkey started an interval training session on the [place]. He has the goal to dance shag for a 3 minute song. HIs levels on the RB are 6-9 and his rpm 60-75. Discussed interval  training with Eddie Dibbles. He has the goal of being able to complete a 3 minute high intensity shag dance. He will do 2-3 minute intervals on the NS and RB. He will increase in level and speed and then drop back to a moderate base intensity for 3-5 minutes.       Quit Smoking   Goals Progress/Improvement seen No   Yes    Comments Mr Tanguma has smoked 2 cigarrettes so far this week.   Mr Ringwald only had 2 cigarettes this weekend. He had been with a friend and had the cigarettes. We talked about using a nicotine replacement which he does have the gum, but he wants to quit without  any nicotine substitute.    Understand more about Heart/Pulmonary Disease   Goals Progress/Improvement seen      Yes   Comments     Mr Kirk has acquired increased knowledge of COPD which has helped him manage his daily life with COPD, especially increased exercise and proper use of his inhalers.   Improve shortness of breath with ADL's   Goals Progress/Improvement seen    Yes     Comments   Keawe is still having to use his Albuterol more than directed and is running out before the end of the month.  Instructed him to call MD  and notify him of this.  He is able to walk further distances and dance a little since beginning [facility] and is walking on the TM with an incline.     Breathing Techniques   Goals Progress/Improvement seen    Yes     Comments   Mr. Naill is using PLB when on the equipment in [facility], espically on the TM.     Increase knowledge of respiratory medications   Goals Progress/Improvement seen    Yes     Comments   Mr. Leffler is no longer taking Combivent.  He does have a spacer and using it with his proventil and symbicort.     Hypertension   Progress seen toward goals     Yes   Comments     Acceptable BP readings in [facility].   Abnormal Lipids   Progress seen towards goals     Yes   Comments     Mr Pate is compliant with his provastatin and has made some nutritional changes.     01/03/15 1502 01/03/15 1509  01/17/15 1000 01/19/15 1422 01/19/15 1539   Weight Management   Goals Progress/Improvement seen     Yes   Comments     Hymie continues to maintain at 236 pounds. He said the holidays and the being snowed in has hurt his eating habits.  He continues to work on decreasing his sugar intake.  "I do not add sugar to anything."He is grazing less after dinner.    Increase Aerobic Exercise and Physical Activity   Goals Progress/Improvement seen  Yes       Comments Discussed with Mr Crago about the Independent [employer] and our Scholarship program. He is very interested and does like the [employer].       Quit Smoking   Goals Progress/Improvement seen No  No Yes    Comments Mr Hagins had a bad day yesterday. 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 I gave him information on the VOKE nicotine replacement  inhaler which could be a substite for a cigarrettte in situations like this. He is planning on research it. The Voke was to be approved by the FDA this year.  Mr Bergdoll did smoke one cigarrette this morning. 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  Mr. Wildeman did not smoke at all yesterday.  He has had one cigarette today.    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     Breathing Techniques   Goals Progress/Improvement seen     Yes    Comments    Mr. Tetterton is showing proper technique with PLB while on the TM.    Increase knowledge of respiratory medications   Goals Progress/Improvement seen   Yes      Comments  Discussed with Mr Grays about buying an oximeter which would be helpful to monitor his O2Sat's with activites at home.      Hypertension   Goal     Participant will see blood pressure controlled within  the values of 140/61m/Hg or within value directed by their physician.   Progress seen toward goals     Yes   Comments  continues with normal BP readings.      01/22/15 1008 01/22/15 1546         Quit Smoking   Goals Progress/Improvement seen  Yes      Comments  Mr Cambria has not had a cigarrette since Friday!      Improve shortness of breath with ADL's   Goals Progress/Improvement seen  No       Comments SOB has not improved, however, it is maintaining where its at currently.  Hopes continuing exercise will improve this as time goes on.          Personal Goals Discharge (Final Personal Goals and Risk Factors Review):      Goals and Risk Factor Review - 01/22/15 1546    Quit Smoking   Goals Progress/Improvement seen Yes   Comments Mr Routh has not had a cigarrette since Friday!      ITP Comments:     ITP Comments      12/13/14 1000 12/15/14 1427 12/18/14 1029 12/29/14 1401 01/03/15 1116   ITP Comments Estimated timetable to achieve ITP goals is 16 more sessions. Mr. Arnesen had blood work and an x-ray yesterday.  He will be out on Wednesday for an oncologist visit at [facility]. ITP goals anticipated to be met in 16 more sessions.  ITP goals anticipated to be met in 13 more sessions. Personal and exercise goals expected to be met in12 more sessions. Progress on specific individualized goals will be charted in patient's ITP. Upon completion of the program the patient will be comfortable managing exercise goals and progression on their own     01/17/15 1056 01/19/15 1121 01/19/15 1422 01/22/15 1050 01/24/15 1012   ITP Comments Personal and exercise goals expected to be met in 11 more sessions. Progress on specific individualized goals will be charted in patient's ITP. Upon completion of the program the patient will be comfortable managing exercise goals and progression on their own.  Personal and exercise goals expected to be met in 26 more sessions. Progress on specific  individualized goals will be charted in patient's ITP. Upon completion of the program the patient will be comfortable managing exercise goals and progression on their own.  Personal and exercise goals expected to be met in 10 more sessions. Progress on specific individualized goals will be charted in patient's ITP. Upon completion of the program the patient will be comfortable managing exercise goals and progression on their own.  Personal and exercise goals anticipated to be met in 9 more sessions.  Personal and exercise goals expected to be met in more 7 sessions. Progress on specific individualized goals will be charted in patient's ITP. Upon completion of the program the patient will be comfortable managing exercise goals and progression on their own.       Comments:

## 2015-01-24 NOTE — Progress Notes (Signed)
Daily Session Note  Patient Details  Name: Nathaniel [name] MRN: [id number] Date of Birth: 1955/02/04 Referring Provider:  Ines Bloomer, MD  Encounter Date: 01/24/2015  Check In:     Session Check In - 01/24/15 1023    Check-In   Staff Present Carson Myrtle, BS, RRT, Respiratory Therapist;Chloris Marcoux, BS, ACSM EP-C, Exercise Physiologist   ER physicians immediately available to respond to emergencies [facility] immediately available ER MD   Physician(s) Cinda Quest and yao   Medication changes reported     No   Fall or balance concerns reported    No   Warm-up and Cool-down Performed on first and last piece of equipment   VAD Patient? No   Pain Assessment   Currently in Pain? No/denies         Goals Met:  Proper associated with RPD/PD & O2 Sat Exercise tolerated well No report of cardiac concerns or symptoms Strength training completed today  Goals Unmet:  Not Applicable  Goals Comments: [name] is working well during class with no complaints.    Dr. Emily Filbert is Medical Director for [place] and [facility].

## 2015-01-26 ENCOUNTER — Encounter: Payer: Medicaid Other | Admitting: *Deleted

## 2015-01-26 DIAGNOSIS — J449 Chronic obstructive pulmonary disease, unspecified: Secondary | ICD-10-CM | POA: Diagnosis not present

## 2015-01-26 NOTE — Progress Notes (Signed)
Daily Session Note  Patient Details  Name: Nathaniel [name] MRN: [id number] Date of Birth: 08-Jun-1955 Referring Provider:  Ines Bloomer, MD  Encounter Date: 01/26/2015  Check In:     Session Check In - 01/26/15 1056    Check-In   Staff Present Candiss Norse, MS, ACSM CEP, Exercise Physiologist;Susanne Bice, RN, BSN, CCRP;Stacey Blanch Media, RRT, RCP, Respiratory Therapist   ER physicians immediately available to respond to emergencies [facility] immediately available ER MD   Physician(s) Clearnce Hasten and Quale   Medication changes reported     No   Fall or balance concerns reported    No   Warm-up and Cool-down Performed on first and last piece of equipment   VAD Patient? No   Pain Assessment   Currently in Pain? No/denies   Multiple Pain Sites No         Goals Met:  Proper associated with RPD/PD & O2 Sat Independence with exercise equipment Using PLB without cueing & demonstrates good technique Exercise tolerated well Strength training completed today  Goals Unmet:  Not Applicable  Goals Comments: Patient completed exercise prescription and all exercise goals during rehab session. The exercise was tolerated well and the patient is progressing in the program.    Dr. Emily Filbert is Medical Director for [place] and [facility].

## 2015-01-29 ENCOUNTER — Encounter: Payer: Medicaid Other | Admitting: *Deleted

## 2015-01-29 DIAGNOSIS — J449 Chronic obstructive pulmonary disease, unspecified: Secondary | ICD-10-CM | POA: Diagnosis not present

## 2015-01-29 NOTE — Progress Notes (Signed)
Pulmonary Individual Treatment Plan  Patient Details  Name: Nathaniel [name] MRN: [id number] Date of Birth: Oct 06, 1955 Referring Provider:  Ines Bloomer, MD  Initial Encounter Date:  10/17/14  Visit Diagnosis: COPD, moderate ([place])  Patient's Home Medications on Admission:  Current outpatient prescriptions:  .  albuterol (PROVENTIL HFA;VENTOLIN HFA) 108 (90 BASE) MCG/ACT inhaler, Inhale 2 puffs into the lungs every 6 (six) hours as needed for wheezing or shortness of breath., Disp: , Rfl:  .  albuterol (PROVENTIL HFA;VENTOLIN HFA) 108 (90 BASE) MCG/ACT inhaler, Inhale into the lungs., Disp: , Rfl:  .  budesonide-formoterol (SYMBICORT) 160-4.5 MCG/ACT inhaler, Inhale into the lungs., Disp: , Rfl:  .  COMBIVENT RESPIMAT 20-100 MCG/ACT AERS respimat, Inhale 1 puff into the lungs daily., Disp: , Rfl: 11 .  enalapril (VASOTEC) 10 MG tablet, Take 1 tablet by mouth at bedtime., Disp: , Rfl: 11 .  hydrocortisone cream 1 %, Apply 1 application topically 2 (two) times daily as needed., Disp: , Rfl:  .  Na Sulfate-K Sulfate-Mg Sulf (SUPREP BOWEL PREP) SOLN, Take 1 kit by mouth as directed., Disp: 1 Bottle, Rfl: 0 .  oxyCODONE-acetaminophen (PERCOCET/ROXICET) 5-325 MG per tablet, Take 1 tablet by mouth every 6 (six) hours as needed., Disp: , Rfl: 0 .  pravastatin (PRAVACHOL) 10 MG tablet, Take 10 mg by mouth daily., Disp: , Rfl:  .  sildenafil (VIAGRA) 100 MG tablet, Take 1 tablet by mouth as needed., Disp: , Rfl:  .  tiotropium (SPIRIVA HANDIHALER) 18 MCG inhalation capsule, Place into inhaler and inhale., Disp: , Rfl:   Past Medical History: Past Medical History  Diagnosis Date  . COPD (chronic obstructive pulmonary disease) ([place])   . Hypertension   . Chronic back pain     Dr. Quay Burow manages (per pt)  . MGUS (monoclonal gammopathy of unknown significance)   . Proteinuria   . Hearing loss   . Hyperlipidemia   . Tobacco abuse     Tobacco Use: History  Smoking status  . Current Some Day  Smoker -- 2.00 packs/day for 45 years  . Types: Cigarettes  Smokeless tobacco  . Never Used    Labs: Recent Review Flowsheet Data    There is no flowsheet data to display.       ADL UCSD:     ADL UCSD      10/17/14 1130       ADL UCSD   ADL Phase Entry     SOB Score total 48     Rest 1     Walk 3     Stairs 4     Bath 2     Dress 0     Shop 0         Pulmonary Function Assessment:     Pulmonary Function Assessment - 10/17/14 1130    Pulmonary Function Tests   RV% 270 %   DLCO% 67 %   Initial Spirometry Results   FVC% 45.9 %   FEV1% 23 %   FEV1/FVC Ratio 40   Post Bronchodilator Spirometry Results   FVC% 45.1 %   FEV1% 34 %   FEV1/FVC Ratio 40      Exercise Target Goals:    Exercise Program Goal: Individual exercise prescription set with THRR, safety & activity barriers. Participant demonstrates ability to understand and report RPE using BORG scale, to self-measure pulse accurately, and to acknowledge the importance of the exercise prescription.  Exercise Prescription Goal: Starting with aerobic activity 30 plus minutes a  day, 3 days per week for initial exercise prescription. Provide home exercise prescription and guidelines that participant acknowledges understanding prior to discharge.  Activity Barriers & Risk Stratification:     Activity Barriers & Risk Stratification - 10/17/14 1130    Activity Barriers & Risk Stratification   Activity Barriers Back Problems;Shortness of Breath;Deconditioning;Muscular Weakness   Risk Stratification Moderate      6 Minute Walk:     6 Minute Walk      10/19/14 1007 12/11/14 1053     6 Minute Walk   Phase Initial Mid Program    Distance 1340 feet 1500 feet    Walk Time 6 minutes 6 minutes    Resting HR 81 bpm 77 bpm    Resting BP 122/64 mmHg 128/72 mmHg    Max Ex. HR 91 bpm 101 bpm    Max Ex. BP 142/62 mmHg 138/82 mmHg    RPE 9 13    Perceived Dyspnea  2 3    Symptoms No No       Initial  Exercise Prescription:     Initial Exercise Prescription - 10/19/14 1000    Date of Initial Exercise Prescription   Date 10/17/14   Treadmill   MPH 2.5   Grade 0   Minutes 10   Recumbant Bike   Level 2   RPM 40   Watts 20   Minutes 10   NuStep   Level 3   Watts 50   Minutes 10   Arm Ergometer   Level 1   Watts 10   Minutes 10   Recumbant Elliptical   Level 1   RPM 40   Watts 20   Minutes 10   Elliptical   Level 1   Speed 3   Minutes 1   REL-XR   Level 3   Watts 50   Minutes 10   Prescription Details   Frequency (times per week) 3   Duration Progress to 30 minutes of continuous aerobic without signs/symptoms of physical distress   Intensity   THRR REST +  30   Ratings of Perceived Exertion 11-15   Perceived Dyspnea 2-4   Progression Continue progressive overload as per policy without signs/symptoms or physical distress.   Resistance Training   Training Prescription Yes   Weight 2   Reps 10-15      Exercise Prescription Changes:     Exercise Prescription Changes      10/20/14 1100 10/27/14 1000 10/30/14 1100 11/03/14 1100 11/06/14 1000   Exercise Review   Progression  Yes Yes Yes Yes   Response to Exercise   Blood Pressure (Admit) 122/70 mmHg       Blood Pressure (Exercise) 142/90 mmHg       Blood Pressure (Exit) 116/78 mmHg       Heart Rate (Admit) 78 bpm       Heart Rate (Exercise) 114 bpm       Heart Rate (Exit) 88 bpm       Oxygen Saturation (Admit) 97 %       Oxygen Saturation (Exercise) 96 %       Oxygen Saturation (Exit) 97 %       Rating of Perceived Exertion (Exercise) 13       Perceived Dyspnea (Exercise) 3       Symptoms None None None None None   Comments First day of exercise! Patient was oriented to the gym and the equipment functions and settings. Procedures and policies of  the gym were outlined and explained. The patient's individual exercise prescription and treatment plan were reviewed with them. All starting workloads were  established based on the results of the functional testing  done at the initial intake visit. The plan for exercise progression was also introduced and progression will be customized based on the patient's performance and goals.  Reviewed individualized exercise prescription and made increases per departmental policy. Exercise increases were discussed with the patient and they were able to perform the new work loads without issue (no signs or symptoms).   Mays says that he has not had a cigarette in 1 week. Reviewed individualized exercise prescription and made increases per departmental policy. Exercise increases were discussed with the patient and they were able to perform the new work loads without issue (no signs or symptoms).  Reviewed individualized exercise prescription and made increases per departmental policy. Exercise increases were discussed with the patient and they were able to perform the new work loads without issue (no signs or symptoms).  Damarian was given increases in intensity on the TM, NS, and RB today. Reviewed individualized exercise prescription and made increases per departmental policy. Exercise increases were discussed with the patient and they were able to perform the new work loads without issue (no signs or symptoms).    Duration Progress to 30 minutes of continuous aerobic without signs/symptoms of physical distress Progress to 30 minutes of continuous aerobic without signs/symptoms of physical distress Progress to 30 minutes of continuous aerobic without signs/symptoms of physical distress Progress to 30 minutes of continuous aerobic without signs/symptoms of physical distress Progress to 30 minutes of continuous aerobic without signs/symptoms of physical distress   Intensity Rest + 30 Rest + 30 Rest + 30 Rest + 30 Rest + 30   Progression Continue progressive overload as per policy without signs/symptoms or physical distress. Continue progressive overload as per policy without  signs/symptoms or physical distress. Continue progressive overload as per policy without signs/symptoms or physical distress. Continue progressive overload as per policy without signs/symptoms or physical distress. Continue progressive overload as per policy without signs/symptoms or physical distress.   Resistance Training   Training Prescription Yes Yes Yes Yes Yes   Weight '2 2 2 4 4   '$ Reps 10-12 10-12 10-12 10-12 10-12   Interval Training   Interval Training No No No No No   Treadmill   MPH 2.6 2.7 2.7 2.7 3   Grade 0 0 0 0 0   Minutes '10 12 15 15 15   '$ Recumbant Bike   Level 3.5 3.5 3.5 3.5 4   RPM 50 50 50 50 60   Minutes '15 15 15 15 15   '$ NuStep   Level '3 3 3 3 4   '$ Watts 50 50 50 50 50   Minutes '20 20 20 20 20     '$ 11/08/14 1000 11/13/14 1400 11/15/14 1000 11/17/14 1100 11/22/14 1000   Exercise Review   Progression Yes Yes Yes Yes Yes   Response to Exercise   Blood Pressure (Admit)  120/64 mmHg      Blood Pressure (Exercise)  160/80 mmHg      Blood Pressure (Exit)  122/66 mmHg      Heart Rate (Admit)  85 bpm      Heart Rate (Exercise)  129 bpm      Heart Rate (Exit)  101 bpm      Oxygen Saturation (Admit)  96 %      Oxygen Saturation (Exercise)  93 %      Oxygen Saturation (Exit)  94 %      Rating of Perceived Exertion (Exercise)  14      Perceived Dyspnea (Exercise)  4      Symptoms None None   None   Comments Diontae was given increases in intensity on the TM, NS, and RB today. Reviewed individualized exercise prescription and made increases per departmental policy. Exercise increases were discussed with the patient and they were able to perform the new work loads without issue (no signs or symptoms).  Nobel was given increases in intensity on the TM, NS, and RB today. Reviewed individualized exercise prescription and made increases per departmental policy. Exercise increases were discussed with the patient and they were able to perform the new work loads without issue (no signs  or symptoms).    Reviewed individualized exercise prescription and made increases per departmental policy. Exercise increases were discussed with the patient and they were able to perform the new work loads without issue (no signs or symptoms).    Duration Progress to 30 minutes of continuous aerobic without signs/symptoms of physical distress Progress to 30 minutes of continuous aerobic without signs/symptoms of physical distress Progress to 30 minutes of continuous aerobic without signs/symptoms of physical distress Progress to 30 minutes of continuous aerobic without signs/symptoms of physical distress Progress to 30 minutes of continuous aerobic without signs/symptoms of physical distress   Intensity Rest + 30 Rest + 30 Rest + 30 Rest + 30 Rest + 30   Progression Continue progressive overload as per policy without signs/symptoms or physical distress. Continue progressive overload as per policy without signs/symptoms or physical distress. Continue progressive overload as per policy without signs/symptoms or physical distress. Continue progressive overload as per policy without signs/symptoms or physical distress. Continue progressive overload as per policy without signs/symptoms or physical distress.   Resistance Training   Training Prescription Yes Yes Yes Yes Yes   Weight '4 5 5 5 5   '$ Reps 10-12 10-12 10-12 10-12 10-12   Interval Training   Interval Training No No No No No   Treadmill   MPH 3 3.3 3.3 3.3 3.3   Grade '1 1 1 1 1   '$ Minutes '15 15 15 15 15   '$ Recumbant Bike   Level '4 5 5 5 5   '$ RPM 60 60 60 60 60   Minutes '15 15 15 15 15   '$ NuStep   Level '5 5 5 6 6   '$ Watts 80 80 80 80 70  80W was for the older style NS, Sharod is now on newer style   Minutes '20 20 20 20 20     '$ 12/04/14 1636 12/11/14 1100 12/13/14 1000 12/15/14 1000 12/18/14 1000   Exercise Review   Progression Yes Yes Yes Yes Yes   Response to Exercise   Blood Pressure (Admit) 124/70 mmHg 128/72 mmHg      Blood Pressure  (Exercise) 150/70 mmHg 138/82 mmHg      Blood Pressure (Exit) 122/80 mmHg       Heart Rate (Admit) 86 bpm 77 bpm      Heart Rate (Exercise) 111 bpm 108 bpm      Heart Rate (Exit) 103 bpm       Oxygen Saturation (Admit) 96 % 97 %      Oxygen Saturation (Exercise) 95 % 94 %      Oxygen Saturation (Exit) 94 %       Rating of Perceived Exertion (  Exercise) 13 15      Perceived Dyspnea (Exercise) 4 4      Symptoms None none none None None   Comments   Started Kiree with regular high intensity interval training. He was educated on proper HIIT training and how to progress with it.  Reviewed individualized exercise prescription and made increases per departmental policy. Exercise increases were discussed with the patient and they were able to perform the new work loads without issue (no signs or symptoms).  Reviewed individualized exercise prescription and made increases per departmental policy. Exercise increases were discussed with the patient and they were able to perform the new work loads without issue (no signs or symptoms).    Duration Progress to 30 minutes of continuous aerobic without signs/symptoms of physical distress Progress to 50 minutes of aerobic without signs/symptoms of physical distress Progress to 50 minutes of aerobic without signs/symptoms of physical distress Progress to 50 minutes of aerobic without signs/symptoms of physical distress Progress to 50 minutes of aerobic without signs/symptoms of physical distress   Intensity Rest + 30 THRR unchanged THRR unchanged THRR unchanged THRR unchanged   Progression Continue progressive overload as per policy without signs/symptoms or physical distress. Continue progressive overload as per policy without signs/symptoms or physical distress. Continue progressive overload as per policy without signs/symptoms or physical distress. Continue progressive overload as per policy without signs/symptoms or physical distress. Continue progressive overload as  per policy without signs/symptoms or physical distress.   Resistance Training   Training Prescription Yes Yes Yes Yes Yes   Weight '5 5 5 5 5   '$ Reps 10-12 10-15 10-15 10-15 10-15   Interval Training   Interval Training No No Yes Yes Yes   Equipment   NuStep;Recumbant Bike NuStep;Recumbant Bike NuStep;Recumbant Bike   Comments   NS and RB, interval for 2-3 minutes with increase in speed and level. Drop back to moderate base intensity for 3-5 minutes. Specific workloads and times written out in exercise chart.  NS and RB, interval for 2-3 minutes with increase in speed and level. Drop back to moderate base intensity for 3-5 minutes. Specific workloads and times written out in exercise chart.  NS and RB, interval for 2-3 minutes with increase in speed and level. Drop back to moderate base intensity for 3-5 minutes. Specific workloads and times written out in exercise chart.    Treadmill   MPH 3.3 3.3 3.3 3.3 3.5  speeds between 3.3-3.5 mph   Grade '1 1 1 2 3   '$ Minutes '15 15 15 15 15   '$ Recumbant Bike   Level '5 5 5 5 5   '$ RPM 60 60 60 60 60   Minutes '15 15 15 15 15   '$ NuStep   Level '6 7 7 7 7   '$ Watts 70  80W was for the older style NS, [place]on is now on newer style 92 92 90 90   Minutes '20 15 15 15 15     '$ 12/22/14 1200 01/22/15 1000 01/22/15 1316       Exercise Review   Progression Yes Yes Yes     Response to Exercise   Blood Pressure (Admit)   126/78 mmHg     Blood Pressure (Exercise)   150/80 mmHg     Blood Pressure (Exit)   108/80 mmHg     Heart Rate (Admit)   84 bpm     Heart Rate (Exercise)   126 bpm     Heart Rate (Exit)   101  bpm     Oxygen Saturation (Admit)   97 %     Oxygen Saturation (Exercise)   93 %     Oxygen Saturation (Exit)   95 %     Rating of Perceived Exertion (Exercise)   15     Perceived Dyspnea (Exercise)   4     Symptoms None None None     Comments Added an incline to the TM for [place] due to an increase in his stamina on the treadmill and to provide another form of  progression for him.  Reviewed individual exercise increases with the patient made to his exercise prescription. He was able to tolerate increased workloads with no signs or symptoms.       Duration Progress to 50 minutes of aerobic without signs/symptoms of physical distress Progress to 50 minutes of aerobic without signs/symptoms of physical distress Progress to 50 minutes of aerobic without signs/symptoms of physical distress     Intensity THRR unchanged THRR unchanged THRR unchanged     Progression Continue progressive overload as per policy without signs/symptoms or physical distress. Continue progressive overload as per policy without signs/symptoms or physical distress. Continue progressive overload as per policy without signs/symptoms or physical distress.     Resistance Training   Training Prescription Yes Yes Yes     Weight '5 5 5     '$ Reps 10-15 10-15 10-15     Interval Training   Interval Training Yes Yes Yes     Equipment NuStep;Recumbant Bike NuStep;Recumbant Bike NuStep;Recumbant Bike     Comments NS and RB, interval for 2-3 minutes with increase in speed and level. Drop back to moderate base intensity for 3-5 minutes. Specific workloads and times written out in exercise chart.  NS and RB, interval for 2-3 minutes with increase in speed and level. Drop back to moderate base intensity for 3-5 minutes. Specific workloads and times written out in exercise chart.  NS and RB, interval for 2-3 minutes with increase in speed and level. Drop back to moderate base intensity for 3-5 minutes. Specific workloads and times written out in exercise chart.      Treadmill   MPH 3.5  speeds between 3.3-3.5 mph 3.5  speed consistantly at 3.5 3.5  speed consistantly at 3.5     Grade '4 4 4     '$ Minutes '15 15 15     '$ Recumbant Bike   Level '5 5 5     '$ RPM 60 60 60     Minutes '15 15 15     '$ NuStep   Level '7 7 7     '$ Watts 90 100 100     Minutes '15 15 15        '$ Discharge Exercise Prescription (Final  Exercise Prescription Changes):     Exercise Prescription Changes - 01/22/15 1316    Exercise Review   Progression Yes   Response to Exercise   Blood Pressure (Admit) 126/78 mmHg   Blood Pressure (Exercise) 150/80 mmHg   Blood Pressure (Exit) 108/80 mmHg   Heart Rate (Admit) 84 bpm   Heart Rate (Exercise) 126 bpm   Heart Rate (Exit) 101 bpm   Oxygen Saturation (Admit) 97 %   Oxygen Saturation (Exercise) 93 %   Oxygen Saturation (Exit) 95 %   Rating of Perceived Exertion (Exercise) 15   Perceived Dyspnea (Exercise) 4   Symptoms None   Duration Progress to 50 minutes of aerobic without signs/symptoms of physical distress   Intensity THRR  unchanged   Progression Continue progressive overload as per policy without signs/symptoms or physical distress.   Resistance Training   Training Prescription Yes   Weight 5   Reps 10-15   Interval Training   Interval Training Yes   Equipment NuStep;Recumbant Bike   Comments NS and RB, interval for 2-3 minutes with increase in speed and level. Drop back to moderate base intensity for 3-5 minutes. Specific workloads and times written out in exercise chart.    Treadmill   MPH 3.5  speed consistantly at 3.5   Grade 4   Minutes 15   Recumbant Bike   Level 5   RPM 60   Minutes 15   NuStep   Level 7   Watts 100   Minutes 15       Nutrition:  Target Goals: Understanding of nutrition guidelines, daily intake of sodium '1500mg'$ , cholesterol '200mg'$ , calories 30% from fat and 7% or less from saturated fats, daily to have 5 or more servings of fruits and vegetables.  Biometrics:     Pre Biometrics - 10/19/14 1012    Pre Biometrics   Height '6\' 1"'$  (1.854 m)   Weight 236 lb 8 oz (107.276 kg)   Waist Circumference 43.5 inches   Hip Circumference 44 inches   Waist to Hip Ratio 0.99 %   BMI (Calculated) 31.3       Nutrition Therapy Plan and Nutrition Goals:     Nutrition Therapy & Goals - 11/20/14 1526    Nutrition Therapy   Diet  Instructed patient on a 2000 calorie meal plant to promote weight loss including general dietary guidelines for lung health and DASH diet principles   Fiber 20 grams   Whole Grain Foods 3 servings   Protein 8 ounces/day   Saturated Fats 13 max. grams   Fruits and Vegetables 5 servings/day   Personal Nutrition Goals   Personal Goal #1 To work on a more consistent pattern of eating with 3 meals per day spaced 4-6 hours apart. Include only 1 evening snack vs. grazing.   Personal Goal #2 Decrease sweetened tea from 1/2 gallon per day to 1-2 (10 oz) glasses at meals.   Personal Goal #3 Try 1% milk and limit milk products to 3 per day including yogurt and cheese (no more than once daily).   Personal Goal #4 Read food labels for sodium with goal of no more than '600mg'$  sodium per meal.   Additional Goals? Yes   Personal Goal #5 Add steamed vegetables and fruit to "take out" sandwich meals vs. fries or chips.   Personal Goal #6 Increase water intake. Keep bottled water with you.      Nutrition Discharge: Rate Your Plate Scores:     Rate Your Plate - 40/98/11 9147    Rate Your Plate Scores   Pre Score 55   Pre Score % 61 %      Psychosocial: Target Goals: Acknowledge presence or absence of depression, maximize coping skills, provide positive support system. Participant is able to verbalize types and ability to use techniques and skills needed for reducing stress and depression.  Initial Review & Psychosocial Screening:     Initial Psych Review & Screening - 10/17/14 1130    Family Dynamics   Good Support System? Yes   Comments Mr Steenson has good support from his 3 children and wife. He is managing his COPD well with pacing through activities. 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   Barriers   Psychosocial barriers to participate in program There are no identifiable barriers or psychosocial needs.;The patient should benefit  from training in stress management and relaxation.   Screening Interventions   Interventions Encouraged to exercise      Quality of Life Scores:     Quality of Life - 10/17/14 1130    Quality of Life Scores   Health/Function Pre 17.23 %   Socioeconomic Pre 17 %   Psych/Spiritual Pre 22.55 %   Family Pre 22.8 %   GLOBAL Pre 18.98 %      PHQ-9:     Recent Review Flowsheet Data    Depression screen PHQ 2/9 10/17/2014   Decreased Interest 1   Down, Depressed, Hopeless 0   PHQ - 2 Score 1      Psychosocial Evaluation and Intervention:     Psychosocial Evaluation - 11/06/14 1053    Psychosocial Evaluation & Interventions   Interventions Stress management education;Relaxation education;Encouraged to exercise with the program and follow exercise prescription   Comments Counselor met with Mr. Spofford today for initial psychosocial evaluation.  He is a 60 year old who has COPD and multiple health issues, including kidney disease, degenerative disk problems in his lower back and a blood cancer disease that is currently in remission.  Mr. Asbridge has a strong support system with a significant other of 5 years and an adult son who lives close by.  He reports he was sleeping too much and eating too much until recently when he quit smoking, began new medication and working out, so these things have improved.  Mr. Madan denies a history of depression or anxiety or current symptoms.  He admitted to being very "grouchy" after he quit smoking several weeks ago, but that has improved with time.  He states he is typically in a positive mood most of the time.  Mr. Costanzo reports he has some stress in his life as he is around people who smoke frequently and this is counterproductive to his goal to quit.  Counselor worked with him on "triggers" and ways to change his lifestyle in order to promote success in this area.  His primary goal is to dance an entire song without stopping by this end of this pulmonary  program.  Mr. Vandeberg will benefit from meeting with the dietician to accomplish his weight loss and healthier eating goals.  Counselor will follow up with Mr. Atkerson on these goals.   Continued Psychosocial Services Needed Yes  Mr. Haskew will benefit from the psychoeducational components of this program in order to help him relax and handle the stress of quitting smoking better.  He will also benefit from seeing the dietician for weight loss goals.        Psychosocial Re-Evaluation:     Psychosocial Re-Evaluation      11/27/14 1058 01/17/15 1128         Psychosocial Re-Evaluation   Comments Follow up with Mr. Noll today reporting struggling more to breathe now that the weather is colder.  He states he was able to dance recently most of a song, although he definitely had to pace himself more than in the past.  This is an improvement and is one of his goals for this program.  Counselor commended Mr. Richman on this.  He admits to continuing to struggle with smoking cessation and this has "set him back" a bit from accomplishing his goals.  Discussed his plan  to get back on track as well as his nutrition plan following meeting with the dietician.  Mr. Peplinski is aware that stress is a "trigger" for him and has alternative ways to cope without smoking.  Counselor will continue to follow with him.   Follow up with Mr. Jocson today reporting been out of class awhile due to sickness and a death recently of a close friend.  Mr. Carlile reports he continues to struggle with smoking - mostly when he is in social situations.  He continues to go dancing on Friday nights and has noticed improved stamina and energy.  Counselor discussed ways to help with social pressure to smoke and Mr. Tennyson agreed that the "gum" had helped but he was out of the  Rx.  He was encouraged to get that filled in order to promote success in this area.  Counselor will follow as needed.         Education: Education Goals: Education classes  will be provided on a weekly basis, covering required topics. Participant will state understanding/return demonstration of topics presented.  Learning Barriers/Preferences:     Learning Barriers/Preferences - 10/17/14 1130    Learning Barriers/Preferences   Learning Barriers None   Learning Preferences Group Instruction;Individual Instruction;Pictoral;Skilled Demonstration;Verbal Instruction;Video;Written Material      Education Topics: Initial Evaluation Education: - Verbal, written and demonstration of respiratory meds, RPE/PD scales, oximetry and breathing techniques. Instruction on use of nebulizers and MDIs: cleaning and proper use, rinsing mouth with steroid doses and importance of monitoring MDI activations.          Pulmonary Rehab from 01/26/2015 in [facility] Cardiac and Pulmonary Rehab   Date  10/17/14   Educator  LB    Instruction Review Code  2- meets goals/outcomes      General Nutrition Guidelines/Fats and Fiber: -Group instruction provided by verbal, written material, models and posters to present the general guidelines for heart healthy nutrition. Gives an explanation and review of dietary fats and fiber.      Pulmonary Rehab from 01/26/2015 in [facility] Cardiac and Pulmonary Rehab   Date  12/11/14   Educator  CR   Instruction Review Code  2- meets goals/outcomes      Controlling Sodium/Reading Food Labels: -Group verbal and written material supporting the discussion of sodium use in heart healthy nutrition. Review and explanation with models, verbal and written materials for utilization of the food label.      Pulmonary Rehab from 01/26/2015 in [facility] Cardiac and Pulmonary Rehab   Date  11/17/14   Educator  Candiss Norse    Instruction Review Code  2- meets goals/outcomes      Exercise Physiology & Risk Factors: - Group verbal and written instruction with models to review the exercise physiology of the cardiovascular system and associated critical values. Details  cardiovascular disease risk factors and the goals associated with each risk factor.   Aerobic Exercise & Resistance Training: - Gives group verbal and written discussion on the health impact of inactivity. On the components of aerobic and resistive training programs and the benefits of this training and how to safely progress through these programs.   Flexibility, Balance, General Exercise Guidelines: - Provides group verbal and written instruction on the benefits of flexibility and balance training programs. Provides general exercise guidelines with specific guidelines to those with heart or lung disease. Demonstration and skill practice provided.      Pulmonary Rehab from 01/26/2015 in [facility] Cardiac and Pulmonary Rehab   Date  11/15/14  Educator  SW   Instruction Review Code  2- meets goals/outcomes      Stress Management: - Provides group verbal and written instruction about the health risks of elevated stress, cause of high stress, and healthy ways to reduce stress.      Pulmonary Rehab from 01/26/2015 in [facility] Cardiac and Pulmonary Rehab   Date  11/08/14   Educator  [facility]   Instruction Review Code  2- meets goals/outcomes      Depression: - Provides group verbal and written instruction on the correlation between heart/lung disease and depressed mood, treatment options, and the stigmas associated with seeking treatment.      Pulmonary Rehab from 01/26/2015 in [facility] Cardiac and Pulmonary Rehab   Date  01/17/15 Marcelina Morel attended 01/03/15]   Educator  [facility]   Instruction Review Code  2- meets goals/outcomes      Exercise & Equipment Safety: - Individual verbal instruction and demonstration of equipment use and safety with use of the equipment.      Pulmonary Rehab from 01/26/2015 in [facility] Cardiac and Pulmonary Rehab   Date  10/20/14   Educator  [place]   Instruction Review Code  2- meets goals/outcomes      Infection Prevention: - Provides verbal and written material to individual with  discussion of infection control including proper hand washing and proper equipment cleaning during exercise session.      Pulmonary Rehab from 01/26/2015 in [facility] Cardiac and Pulmonary Rehab   Date  10/20/14   Educator  [place]   Instruction Review Code  2- meets goals/outcomes      Falls Prevention: - Provides verbal and written material to individual with discussion of falls prevention and safety.      Pulmonary Rehab from 01/26/2015 in [facility] Cardiac and Pulmonary Rehab   Date  10/17/14   Educator  LB   Instruction Review Code  2- meets goals/outcomes      Diabetes: - Individual verbal and written instruction to review signs/symptoms of diabetes, desired ranges of glucose level fasting, after meals and with exercise. Advice that pre and post exercise glucose checks will be done for 3 sessions at entry of program.   Chronic Lung Diseases: - Group verbal and written instruction to review new updates, new respiratory medications, new advancements in procedures and treatments. Provide informative websites and "800" numbers of self-education.      Pulmonary Rehab from 01/26/2015 in [facility] Cardiac and Pulmonary Rehab   Date  12/15/14 [facility]]   Educator  CE   Instruction Review Code  2- meets goals/outcomes      Lung Procedures: - Group verbal and written instruction to describe testing methods done to diagnose lung disease. Review the outcome of test results. Describe the treatment choices: Pulmonary Function Tests, ABGs and oximetry.      Pulmonary Rehab from 01/26/2015 in [facility] Cardiac and Pulmonary Rehab   Date  11/10/14   Educator  [place]   Instruction Review Code  2- meets goals/outcomes      Energy Conservation: - Provide group verbal and written instruction for methods to conserve energy, plan and organize activities. Instruct on pacing techniques, use of adaptive equipment and posture/positioning to relieve shortness of breath.      Pulmonary Rehab from 01/26/2015 in [facility]  Cardiac and Pulmonary Rehab   Date  11/22/14   Educator  SW   Instruction Review Code  2- meets goals/outcomes      Triggers: - Group verbal and written instruction to review  types of environmental controls: home humidity, furnaces, filters, dust mite/pet prevention, HEPA vacuums. To discuss weather changes, air quality and the benefits of nasal washing.      Pulmonary Rehab from 01/26/2015 in [facility] Cardiac and Pulmonary Rehab   Date  01/22/15   Educator  LB   Instruction Review Code  2- meets goals/outcomes      Exacerbations: - Group verbal and written instruction to provide: warning signs, infection symptoms, calling MD promptly, preventive modes, and value of vaccinations. Review: effective airway clearance, coughing and/or vibration techniques. Create an Sports administrator.      Pulmonary Rehab from 01/26/2015 in [facility] Cardiac and Pulmonary Rehab   Date  12/18/14   Educator  LB   Instruction Review Code  2- meets goals/outcomes      Oxygen: - Individual and group verbal and written instruction on oxygen therapy. Includes supplement oxygen, available portable oxygen systems, continuous and intermittent flow rates, oxygen safety, concentrators, and Medicare reimbursement for oxygen.   Respiratory Medications: - Group verbal and written instruction to review medications for lung disease. Drug class, frequency, complications, importance of spacers, rinsing mouth after steroid MDI's, and proper cleaning methods for nebulizers.      Pulmonary Rehab from 01/26/2015 in [facility] Cardiac and Pulmonary Rehab   Date  10/17/14   Educator  LB   Instruction Review Code  2- meets goals/outcomes      AED/CPR: - Group verbal and written instruction with the use of models to demonstrate the basic use of the AED with the basic ABC's of resuscitation.   Breathing Retraining: - Provides individuals verbal and written instruction on purpose, frequency, and proper technique of diaphragmatic breathing and  pursed-lipped breathing. Applies individual practice skills.      Pulmonary Rehab from 01/26/2015 in [facility] Cardiac and Pulmonary Rehab   Date  10/20/14   Educator  [place]   Instruction Review Code  2- meets goals/outcomes      Anatomy and Physiology of the Lungs: - Group verbal and written instruction with the use of models to provide basic lung anatomy and physiology related to function, structure and complications of lung disease.   Heart Failure: - Group verbal and written instruction on the basics of heart failure: signs/symptoms, treatments, explanation of ejection fraction, enlarged heart and cardiomyopathy.      Pulmonary Rehab from 01/26/2015 in [facility] Cardiac and Pulmonary Rehab   Date  01/26/15   Educator  [place]   Instruction Review Code  2- meets goals/outcomes      Sleep Apnea: - Individual verbal and written instruction to review Obstructive Sleep Apnea. Review of risk factors, methods for diagnosing and types of masks and machines for OSA.   Anxiety: - Provides group, verbal and written instruction on the correlation between heart/lung disease and anxiety, treatment options, and management of anxiety.   Relaxation: - Provides group, verbal and written instruction about the benefits of relaxation for patients with heart/lung disease. Also provides patients with examples of relaxation techniques.      Pulmonary Rehab from 01/26/2015 in [facility] Cardiac and Pulmonary Rehab   Date  12/06/14   Educator  [facility]   Instruction Review Code  2- Meets goals/outcomes      Knowledge Questionnaire Score:     Knowledge Questionnaire Score - 10/17/14 1130    Knowledge Questionnaire Score   Pre Score -5      Personal Goals and Risk Factors at Admission:     Personal Goals and Risk Factors at Admission -  11/03/14 1058    Personal Goals and Risk Factors on Admission   Lipids Yes  On meds for cholesterol control.   Goal Cholesterol controlled with medications as prescribed, with  individualized exercise RX and with personalized nutrition plan. Value goals: LDL < 10m, HDL > 485m Participant states understanding of desired cholesterol values and following prescriptions.   Intervention Provide nutrition & aerobic exercise along with prescribed medications to achieve LDL <7061mHDL >64m48m    Personal Goals and Risk Factors Review:      Goals and Risk Factor Review      10/27/14 1417 10/30/14 1000 11/03/14 1054 11/03/14 1059 11/03/14 1111   Weight Management   Goals Progress/Improvement seen   No     Comments   No weight change noted yet  5 sessions attended. To make appointment with RD. HAs cut down on milk consumption,and is working on prtion control. Still need uidance on calories/fat/portion control. Has started working on changing cereals from "sugar Crack" to whole grain cereals. HAs decreased sweets intake too.     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      Quit Smoking   Goals Progress/Improvement seen Yes Yes      Comments Mr. SmithDhananinot had a cigarette in 1 week. Mr SmithWhalints he smokes occasionally and with stress. His trip was for legal matters and was very stressful, but he did not smoke there and states he has not had a cigarrette since his start date in [place], [place]11[place].      Breathing Techniques   Goals Progress/Improvement seen   Yes   --  Ladainian Travarisbreathing tests done this past Wednesday at the referring MD office. He was told his numbers are improved.   Comments  Mr SmithArthsing PLB with his exercise goals and activites at home and states that it is helpful for shortness of breath.      Hypertension   Goal   --  Obaloluwa Rondy not have HTN listed as a risk factor.     Abnormal Lipids   Progress seen towards  goals    Unknown    Comments    On meds for cholesterol control. Is working on eating habits,waiting to see the RD for advice.      11/06/14 1501 11/08/14 1000 11/17/14 1240 11/17/14 1454 11/20/14 1000   Weight Management   Goals Progress/Improvement seen   No     Comments   No weight changes since last spoke with Hector.Eddie Dibbleshas made  a few nutriotin changes. Added yogurt to his meals, continues to decrease milk intake. Added iced tea, then decided the sugar in the tea might not be a great change. Declin Azarions that he grazes after dinner and ends up snacking all evening while watching TV. HE has committed to spending 2 nights a week working on a project in his shop to curb his Grazing" after dinner.  He has set a goal to lose 2 pounds in the next two weeks.     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 This is when it is difficult to not smoke when around other people are smoking.      Understand more about Heart/Pulmonary Disease   Goals Progress/Improvement seen  Yes    Yes   Comments Educated Mr Moronta on his PFT and related it to his COPD. Educated him on the goal to maintain his Spirometry results, prevent them from worsening, as well as his COPD, by exercising, smoking cessation, using his MDI's, good diet, and wearing oxygen if needed.    In the past, Mr Marling modified his activity to adjust for his shortness of breath and COPD. He loves to [place] dance and made a song that was only 71mn long. Last Friday, he was able to shang dance with his wife for 3-4 mins which was a big accomplishment and very important to him.   Improve shortness of breath with ADL's   Goals Progress/Improvement seen   Yes  Yes    Comments  Mr SKauwas having a "bad breathing day". He had mowed the other day and inhaled dusty air. We talked about hime wearing a mask with yardwork, nasal washing after the work, and  using his inhaler for rescue.  Mr. SMeckelsays he is not using as much Proventil since he has been in rehab.  He is not running out before the month's end.  He is also doing more. He is helping his son remodel an old home.    Breathing Techniques   Goals Progress/Improvement seen     Yes    Comments    Mr. SStithis using proper technique for PLB while on the TM.    Increase knowledge of respiratory medications   Goals Progress/Improvement seen   Yes  Yes    Comments  Mr SMuchahas a good understanding of his MDI's. We discussed using his Albuterol MDI before exercise in [facility] and the importance of Spiriva with exercise.  Mr. SBrewis taking Spiriva, Symbicort and Proventil MDIs.  He is rinsing his mouth after taking Spiriva and Symbicort.    Hypertension   Goal   Participant will see blood pressure controlled within the values of 140/918mHg or within value directed by their physician.     Progress seen toward goals   Yes     Comments   PAJenkinstates he is not on any medications presently for BP control. His documented BP readings are in normal range.      Abnormal Lipids   Progress seen towards goals   Unknown     Comments   HAs an appt with the RD next week. PAPavleontinues to work on chGames developerabits to better choices. Added yogurt to his meals since last time I spoke with him.        11/27/14 1111 11/29/14 1538 12/04/14 1051 12/04/14 1611 12/06/14 1102   Weight Management   Goals Progress/Improvement seen  Yes      Comments  Jguadalupe has added some project time to his evenings. He helped on a project that exposed him to "asbestos" dust. He has been sick since this exposure and he was advised to call his MD if he continues to be sick. PaObbieas removed all the junk food from his house since we last spoke and has added more fruit and vegetables to his daily meals. He is down over 3 pounds since making the changes.       Increase Aerobic Exercise and Physical Activity   Goals  Progress/Improvement seen  Yes  Yes  Yes   Comments Mukund used all his inhalers the other day so [name], RN instructed him to call his MD. Today he reported he has limited his cigarette smoking except Thursday he smoked 14 cigarettes. He has also been around asbestors.   Patient reported that walking from his car to the pulmonary rehab gym is now much easier than it was on the first day he came to rehab. He stated that his exercise prescription is challenging, but on a good breathing day he does increase his workload slightly to continually challenge himself.   Eliaz said that the treadmill is the hardest machine for him, but he has noticed his hard work on the treadmill paying off as he can walk easier and faster without illiciting symptoms. He is very encouraged by this progress and attributes it to his consistent attendance in [place].   Quit Smoking   Goals Progress/Improvement seen    Yes    Comments    Mr Buchheit has not smoked since 11/27/2014, even over the Holiday's. He is very determined not to smoke and can feel a difference with his breathing since he has quit.    Improve shortness of breath with ADL's   Goals Progress/Improvement seen  No       Comments Granvel used all his inhalers the other day so [name], RN instructed him to call his MD. Today he reported he has limited his cigarette smoking except Thursday he smoked 14 cigarettes. He has also been around asbestors.          12/13/14 1000 12/13/14 1055 12/15/14 1421 12/18/14 1516 01/02/15 0719   Weight Management   Comments     Mr Saraceno has made some dietitary changes for better nutritional habits. He is maintaining his weight at 236 to 239 in [place].   Increase Aerobic Exercise and Physical Activity   Goals Progress/Improvement seen  Yes Yes      Comments Ms Hennessee started an interval training session on the [place]. He has the goal to dance shag for a 3 minute song. HIs levels on the RB are 6-9 and his rpm 60-75. Discussed interval  training with Eddie Dibbles. He has the goal of being able to complete a 3 minute high intensity shag dance. He will do 2-3 minute intervals on the NS and RB. He will increase in level and speed and then drop back to a moderate base intensity for 3-5 minutes.       Quit Smoking   Goals Progress/Improvement seen No   Yes    Comments Mr Yerger has smoked 2 cigarrettes so far this week.   Mr Mullane only had 2 cigarettes this weekend. He had been with a friend and had the cigarettes. We talked about using a nicotine replacement which he does have the gum, but he wants to quit without  any nicotine substitute.    Understand more about Heart/Pulmonary Disease   Goals Progress/Improvement seen      Yes   Comments     Mr Kneale has acquired increased knowledge of COPD which has helped him manage his daily life with COPD, especially increased exercise and proper use of his inhalers.   Improve shortness of breath with ADL's   Goals Progress/Improvement seen    Yes     Comments   Ellison is still having to use his Albuterol more than directed and is running out before the end of the month.  Instructed him to call MD  and notify him of this.  He is able to walk further distances and dance a little since beginning [facility] and is walking on the TM with an incline.     Breathing Techniques   Goals Progress/Improvement seen    Yes     Comments   Mr. Naill is using PLB when on the equipment in [facility], espically on the TM.     Increase knowledge of respiratory medications   Goals Progress/Improvement seen    Yes     Comments   Mr. Leffler is no longer taking Combivent.  He does have a spacer and using it with his proventil and symbicort.     Hypertension   Progress seen toward goals     Yes   Comments     Acceptable BP readings in [facility].   Abnormal Lipids   Progress seen towards goals     Yes   Comments     Mr Pate is compliant with his provastatin and has made some nutritional changes.     01/03/15 1502 01/03/15 1509  01/17/15 1000 01/19/15 1422 01/19/15 1539   Weight Management   Goals Progress/Improvement seen     Yes   Comments     Hymie continues to maintain at 236 pounds. He said the holidays and the being snowed in has hurt his eating habits.  He continues to work on decreasing his sugar intake.  "I do not add sugar to anything."He is grazing less after dinner.    Increase Aerobic Exercise and Physical Activity   Goals Progress/Improvement seen  Yes       Comments Discussed with Mr Crago about the Independent [employer] and our Scholarship program. He is very interested and does like the [employer].       Quit Smoking   Goals Progress/Improvement seen No  No Yes    Comments Mr Hagins had a bad day yesterday. 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 I gave him information on the VOKE nicotine replacement  inhaler which could be a substite for a cigarrettte in situations like this. He is planning on research it. The Voke was to be approved by the FDA this year.  Mr Bergdoll did smoke one cigarrette this morning. 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  Mr. Wildeman did not smoke at all yesterday.  He has had one cigarette today.    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     Breathing Techniques   Goals Progress/Improvement seen     Yes    Comments    Mr. Tetterton is showing proper technique with PLB while on the TM.    Increase knowledge of respiratory medications   Goals Progress/Improvement seen   Yes      Comments  Discussed with Mr Grays about buying an oximeter which would be helpful to monitor his O2Sat's with activites at home.      Hypertension   Goal     Participant will see blood pressure controlled within  the values of 140/61m/Hg or within value directed by their physician.   Progress seen toward goals     Yes   Comments  continues with normal BP readings.      01/22/15 1008 01/22/15 1546 01/24/15 1511       Quit Smoking   Goals Progress/Improvement seen  Yes No     Comments  Mr Auker has not had a cigarrette since Friday! 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 I talked again about nicotine replacement, but Mr Marucci does not want to use the repalcement. He has to get his cessation in his "mind"  and not smoke.     Improve shortness of breath with ADL's   Goals Progress/Improvement seen  No       Comments SOB has not improved, however, it is maintaining where its at currently.  Hopes continuing exercise will improve this as time goes on.          Personal Goals Discharge (Final Personal Goals and Risk Factors Review):      Goals and Risk Factor Review - 01/24/15 1511    Quit Smoking   Goals Progress/Improvement seen No   Comments Mr Kolander worked yesterday at his [employer] site and had 10 cigarrettes at work. I talked again about nicotine replacement, but Mr Reali does not want to use the repalcement. He has to get his cessation in his "mind"  and not smoke.      ITP Comments:     ITP Comments      12/13/14 1000 12/15/14 1427 12/18/14 1029 12/29/14 1401 01/03/15 1116   ITP Comments Estimated timetable to achieve ITP goals is 16 more sessions. Mr. Viner had blood work and an x-ray yesterday.  He will be out on Wednesday for an oncologist visit at [facility]. ITP goals anticipated to be met in 16 more sessions.  ITP goals anticipated to be met in 13 more sessions. Personal and exercise goals expected to be met in12 more sessions. Progress on specific individualized goals will be charted in patient's ITP. Upon completion of the program the patient will be comfortable managing exercise goals and progression on their own      01/17/15 1056 01/19/15 1121 01/19/15 1422 01/22/15 1050 01/24/15 1012   ITP Comments Personal and exercise goals expected to be met in 11 more sessions. Progress on specific individualized goals will be charted in patient's ITP. Upon completion of the program the patient will be comfortable managing exercise goals and progression on their own.  Personal and exercise goals expected to be met in 26 more sessions. Progress on specific individualized goals will be charted in patient's ITP. Upon completion of the program the patient will be comfortable managing exercise goals and progression on their own.  Personal and exercise goals expected to be met in 10 more sessions. Progress on specific individualized goals will be charted in patient's ITP. Upon completion of the program the patient will be comfortable managing exercise goals and progression on their own.  Personal and exercise goals anticipated to be met in 9 more sessions.  Personal and exercise goals expected to be met in more 7 sessions. Progress on specific individualized goals will be charted in patient's ITP. Upon completion of the program the patient will be comfortable managing exercise goals and progression on their own.      01/26/15 1058           ITP Comments Personal and exercise goals expected to be met in 1  more sessions. Progress on specific individualized goals will be charted in patient's ITP. Upon completion of the  program the patient will be comfortable managing exercise goals and progression on their own.           Comments: 30 Day Review

## 2015-01-29 NOTE — Progress Notes (Signed)
Daily Session Note  Patient Details  Name: Nathaniel [name] MRN: [id number] Date of Birth: Mar 07, 1955 Referring Provider:  Ines Bloomer, MD  Encounter Date: 01/29/2015  Check In:     Session Check In - 01/29/15 1047    Check-In   Staff Present Carson Myrtle, BS, RRT, Respiratory Bertis Ruddy, BS, ACSM CEP, Exercise Physiologist;Steven Way, BS, ACSM EP-C, Exercise Physiologist   ER physicians immediately available to respond to emergencies [facility] immediately available ER MD   Physician(s) Joni Fears and Jimmye Norman   Medication changes reported     No   Fall or balance concerns reported    No   Warm-up and Cool-down Performed on first and last piece of equipment   VAD Patient? No   Pain Assessment   Currently in Pain? No/denies   Multiple Pain Sites No         Goals Met:  Proper associated with RPD/PD & O2 Sat Independence with exercise equipment Exercise tolerated well Strength training completed today  Goals Unmet:  Not Applicable  Goals Comments: Patient completed exercise prescription and all exercise goals during rehab session. The exercise was tolerated well and the patient is progressing in the program.      Dr. Emily Filbert is Medical Director for [place] and [facility].

## 2015-01-31 DIAGNOSIS — J449 Chronic obstructive pulmonary disease, unspecified: Secondary | ICD-10-CM | POA: Diagnosis not present

## 2015-01-31 NOTE — Progress Notes (Signed)
Daily Session Note  Patient Details  Name: Ygnacio Fecteau MRN: [id number] Date of Birth: [birth year] Referring Provider:  Ines Bloomer, MD  Encounter Date: 01/31/2015  Check In:     Session Check In - 01/31/15 1036    Check-In   Staff Present Carson Myrtle, BS, RRT, Respiratory Therapist;Abdou Stocks, BS, ACSM EP-C, Exercise Physiologist   ER physicians immediately available to respond to emergencies [facility] immediately available ER MD   Physician(s) norman and schaevitz   Medication changes reported     No   Fall or balance concerns reported    No   Warm-up and Cool-down Performed on first and last piece of equipment   VAD Patient? No   Pain Assessment   Currently in Pain? No/denies         Goals Met:  Proper associated with RPD/PD & O2 Sat Exercise tolerated well No report of cardiac concerns or symptoms Strength training completed today  Goals Unmet:  O2 Sat  Goals Comments:    Dr. Emily Filbert is Medical Director for [facility] and [facility].

## 2015-02-02 ENCOUNTER — Encounter: Payer: Medicaid Other | Admitting: *Deleted

## 2015-02-02 DIAGNOSIS — J449 Chronic obstructive pulmonary disease, unspecified: Secondary | ICD-10-CM | POA: Diagnosis not present

## 2015-02-02 NOTE — Progress Notes (Signed)
Daily Session Note  Patient Details  Name: Nathaniel [name] MRN: [id number] Date of Birth: Nov 09, 1955 Referring Provider:  Ines Bloomer, MD  Encounter Date: 02/02/2015  Check In:     Session Check In - 02/02/15 1050    Check-In   Staff Present Frederich Cha, RRT, RCP, Respiratory Therapist;Robina Hamor Dillard [place], MS, ACSM CEP, Exercise Physiologist;Susanne Bice, RN, BSN, [place]   ER physicians immediately available to respond to emergencies [facility] immediately available ER MD   Physician(s) Marcelene [place] and Paduchowski   Medication changes reported     No   Fall or balance concerns reported    No   Warm-up and Cool-down Performed on first and last piece of equipment   VAD Patient? No   Pain Assessment   Currently in Pain? No/denies   Multiple Pain Sites No         Goals Met:  Proper associated with RPD/PD & O2 Sat Independence with exercise equipment Using PLB without cueing & demonstrates good technique Exercise tolerated well Personal goals reviewed Strength training completed today  Goals Unmet:  Not Applicable  Goals Comments: Making progress on smoking cessation!   Dr. Emily Filbert is Medical Director for [place] and [facility].

## 2015-02-05 ENCOUNTER — Encounter: Payer: Medicaid Other | Admitting: *Deleted

## 2015-02-05 DIAGNOSIS — J449 Chronic obstructive pulmonary disease, unspecified: Secondary | ICD-10-CM

## 2015-02-05 NOTE — Progress Notes (Signed)
Daily Session Note  Patient Details  Name: Nathaniel [name] MRN: [id number] Date of Birth: 12/30/1955 Referring Provider:  Norton, Dustin, MD  Encounter Date: 02/05/2015  Check In:     Session Check In - 02/05/15 1054    Check-In   Staff Present Laureen Brown, BS, RRT, Respiratory Therapist;Kelly Hayes, BS, ACSM CEP, Exercise Physiologist;Steven Way, BS, ACSM EP-C, Exercise Physiologist   ER physicians immediately available to respond to emergencies [facility] immediately available ER MD   Physician(s) Dr. McShane and Goodman   Medication changes reported     No   Fall or balance concerns reported    No   Warm-up and Cool-down Performed on first and last piece of equipment   VAD Patient? No   Pain Assessment   Currently in Pain? No/denies   Multiple Pain Sites No         Goals Met:  Proper associated with RPD/PD & O2 Sat Independence with exercise equipment Exercise tolerated well Strength training completed today  Goals Unmet:  Not Applicable  Goals Comments: Patient completed exercise prescription and all exercise goals during rehab session. The exercise was tolerated well and the patient is progressing in the program.     Dr. Mark Miller is Medical Director for [facility] and [facility]. 

## 2015-02-07 ENCOUNTER — Encounter: Payer: Medicaid Other | Attending: Internal Medicine | Admitting: *Deleted

## 2015-02-07 DIAGNOSIS — J449 Chronic obstructive pulmonary disease, unspecified: Secondary | ICD-10-CM | POA: Diagnosis present

## 2015-02-07 NOTE — Progress Notes (Signed)
Daily Session Note  Patient Details  Name: Nathaniel [name] MRN: [id number] Date of Birth: 05/13/1955 Referring Provider:  Ines Bloomer, MD  Encounter Date: 02/07/2015  Check In:     Session Check In - 02/07/15 1112    Check-In   ER physicians immediately available to respond to emergencies [facility] immediately available ER MD   Physician(s) Kerman Passey and Jimmye Norman    Medication changes reported     No   Fall or balance concerns reported    No   Warm-up and Cool-down Performed on first and last piece of equipment   VAD Patient? No   Pain Assessment   Currently in Pain? No/denies   Multiple Pain Sites No         Goals Met:  Proper associated with RPD/PD & O2 Sat Independence with exercise equipment Using PLB without cueing & demonstrates good technique Exercise tolerated well Strength training completed today  Goals Unmet:  Not Applicable  Goals Comments: Patient completed exercise prescription and all exercise goals during rehab session. The exercise was tolerated well and the patient is progressing in the program.    Dr. Emily Filbert is Medical Director for [place] and [facility].

## 2015-02-26 ENCOUNTER — Encounter: Payer: Medicaid Other | Admitting: Respiratory Therapy

## 2015-02-26 ENCOUNTER — Encounter: Payer: Self-pay | Admitting: *Deleted

## 2015-02-26 DIAGNOSIS — J449 Chronic obstructive pulmonary disease, unspecified: Secondary | ICD-10-CM

## 2015-02-26 NOTE — Progress Notes (Signed)
Pulmonary Individual Treatment Plan  Patient Details  Name: Nathaniel [name] MRN: [id number] Date of Birth: [birth year] Referring Provider:  Ellamae Sia, MD  Initial Encounter Date: 10/17/2014  Visit Diagnosis: COPD   Tobacco Use: History  Smoking status   Current Some Day Smoker -- 2.00 packs/day for 45 years   Types: Cigarettes  Smokeless tobacco   Never Used     ADL UCSD:     ADL [facility]      10/17/14 1130 12/06/14 1000 01/29/15 1504   ADL UCSD   ADL Phase Entry Mid Exit   SOB Score total 48 77 47   Rest _0 Walk _1 Stairs _2 Bath _3 Dress 0 2 1   Shop 0 3 1       Pulmonary Function Assessment:     Pulmonary Function Assessment - 10/17/14 1130    Pulmonary Function Tests   RV% 270 %   DLCO% 67 %   Initial Spirometry Results   FVC% 45.9 %   FEV1% 23 %   FEV1/FVC Ratio 40   Post Bronchodilator Spirometry Results   FVC% 45.1 %   FEV1% 34 %   FEV1/FVC Ratio 40      Exercise Target Goals:    Exercise Program Goal: Individual exercise prescription set with THRR, safety & activity barriers. Participant demonstrates ability to understand and report RPE using BORG scale, to self-measure pulse accurately, and to acknowledge the importance of the exercise prescription.  Exercise Prescription Goal: Starting with aerobic activity 30 plus minutes a day, 3 days per week for initial exercise prescription. Provide home exercise prescription and guidelines that participant acknowledges understanding prior to discharge.  Activity Barriers & Risk Stratification:     Activity Barriers & Cardiac Risk Stratification - 10/17/14 1130    Activity Barriers & Cardiac Risk Stratification   Activity Barriers Back Problems;Shortness of Breath;Deconditioning;Muscular Weakness   Cardiac Risk Stratification Moderate      6 Minute Walk:     6 Minute Walk      10/19/14 1007 12/11/14 1053 02/07/15 1546   6 Minute Walk   Phase Initial Mid Program  Discharge   Distance 1340 feet 1500 feet 1400 feet   Walk Time 6 minutes 6 minutes 6 minutes   RPE _4 Perceived Dyspnea  _5 Symptoms No No No   Resting HR 81 bpm 77 bpm 76 bpm   Resting BP 122/64 mmHg 128/72 mmHg 138/70 mmHg   Max Ex. HR 91 bpm 101 bpm 98 bpm   Max Ex. BP 142/62 mmHg 138/82 mmHg 140/78 mmHg      Initial Exercise Prescription:     Initial Exercise Prescription - 10/19/14 1000    Date of Initial Exercise Prescription   Date 10/17/14   Treadmill   MPH 2.5   Grade 0   Minutes 10   Recumbant Bike   Level 2   RPM 40   Watts 20   Minutes 10   NuStep   Level 3   Watts 50   Minutes 10   Arm Ergometer   Level 1   Watts 10   Minutes 10   Recumbant Elliptical   Level 1   RPM 40   Watts 20   Minutes 10   Elliptical   Level 1   Speed 3   Minutes 1   REL-XR  Level 3   Watts 50   Minutes 10   Prescription Details   Frequency (times per week) 3   Duration Progress to 30 minutes of continuous aerobic without signs/symptoms of physical distress   Intensity   THRR REST +  30   Ratings of Perceived Exertion 11-15   Perceived Dyspnea 2-4   Progression Continue progressive overload as per policy without signs/symptoms or physical distress.   Resistance Training   Training Prescription Yes   Weight 2   Reps 10-15      Exercise Prescription Changes:     Exercise Prescription Changes      10/20/14 1100 10/27/14 1000 10/30/14 1100 11/03/14 1100 11/06/14 1000   Exercise Review   Progression  Yes Yes Yes Yes   Response to Exercise   Blood Pressure (Admit) 122/70 mmHg       Blood Pressure (Exercise) 142/90 mmHg       Blood Pressure (Exit) 116/78 mmHg       Heart Rate (Admit) 78 bpm       Heart Rate (Exercise) 114 bpm       Heart Rate (Exit) 88 bpm       Oxygen Saturation (Admit) 97 %       Oxygen Saturation (Exercise) 96 %       Oxygen Saturation (Exit) 97 %       Rating of Perceived Exertion (Exercise) 13       Perceived Dyspnea  (Exercise) 3       Symptoms _0    Comments First day of exercise! Patient was oriented to the gym and the equipment functions and settings. Procedures and policies of the gym were outlined and explained. The patient's individual exercise prescription and treatment plan were reviewed with them. All starting workloads were established based on the results of the functional testing  done at the initial intake visit. The plan for exercise progression was also introduced and progression will be customized based on the patient's performance and goals.  Reviewed individualized exercise prescription and made increases per departmental policy. Exercise increases were discussed with the patient and they were able to perform the new work loads without issue (no signs or symptoms).   Brahm says that he has not had a cigarette in 1 week. Reviewed individualized exercise prescription and made increases per departmental policy. Exercise increases were discussed with the patient and they were able to perform the new work loads without issue (no signs or symptoms).  Reviewed individualized exercise prescription and made increases per departmental policy. Exercise increases were discussed with the patient and they were able to perform the new work loads without issue (no signs or symptoms).  Linden was given increases in intensity on the TM, NS, and RB today. Reviewed individualized exercise prescription and made increases per departmental policy. Exercise increases were discussed with the patient and they were able to perform the new work loads without issue (no signs or symptoms).    Duration Progress to 30 minutes of continuous aerobic without signs/symptoms of physical distress Progress to 30 minutes of continuous aerobic without signs/symptoms of physical distress Progress to 30 minutes of continuous aerobic without signs/symptoms of physical distress Progress to 30 minutes of continuous aerobic without  signs/symptoms of physical distress Progress to 30 minutes of continuous aerobic without signs/symptoms of physical distress   Intensity Rest + 30 Rest + 30 Rest + 30 Rest + 30 Rest + 30   Progression Continue progressive overload as per  policy without signs/symptoms or physical distress. Continue progressive overload as per policy without signs/symptoms or physical distress. Continue progressive overload as per policy without signs/symptoms or physical distress. Continue progressive overload as per policy without signs/symptoms or physical distress. Continue progressive overload as per policy without signs/symptoms or physical distress.   Resistance Training   Training Prescription _0    Weight _1 Reps 10-12 10-12 10-12 10-12 10-12   Interval Training   Interval Training _2    Treadmill   MPH 2.6 2.7 2.7 2.7 3   Grade 0 0 0 0 0   Minutes _3 Recumbant Bike   Level 3.5 3.5 3.5 3.5 4   RPM 50 50 50 50 60   Minutes _4 NuStep   Level _5 Watts 50 50 50 50 50   Minutes _6 11/08/14 1000 11/13/14 1400 11/15/14 1000 11/17/14 1100 11/22/14 1000   Exercise Review   Progression _7    Response to Exercise   Blood Pressure (Admit)  120/64 mmHg      Blood Pressure (Exercise)  160/80 mmHg      Blood Pressure (Exit)  122/66 mmHg      Heart Rate (Admit)  85 bpm      Heart Rate (Exercise)  129 bpm      Heart Rate (Exit)  101 bpm      Oxygen Saturation (Admit)  96 %      Oxygen Saturation (Exercise)  93 %      Oxygen Saturation (Exit)  94 %      Rating of Perceived Exertion (Exercise)  14      Perceived Dyspnea (Exercise)  4      Symptoms None None   None   Comments Donald was given increases in intensity on the TM, NS, and RB today. Reviewed individualized exercise prescription and made increases per departmental policy. Exercise increases were discussed with the patient and they were able to  perform the new work loads without issue (no signs or symptoms).  Daunte was given increases in intensity on the TM, NS, and RB today. Reviewed individualized exercise prescription and made increases per departmental policy. Exercise increases were discussed with the patient and they were able to perform the new work loads without issue (no signs or symptoms).    Reviewed individualized exercise prescription and made increases per departmental policy. Exercise increases were discussed with the patient and they were able to perform the new work loads without issue (no signs or symptoms).    Duration Progress to 30 minutes of continuous aerobic without signs/symptoms of physical distress Progress to 30 minutes of continuous aerobic without signs/symptoms of physical distress Progress to 30 minutes of continuous aerobic without signs/symptoms of physical distress Progress to 30 minutes of continuous aerobic without signs/symptoms of physical distress Progress to 30 minutes of continuous aerobic without signs/symptoms of physical distress   Intensity Rest + 30 Rest + 30 Rest + 30 Rest + 30 Rest + 30   Progression Continue progressive overload as per policy without signs/symptoms or physical distress. Continue progressive overload as per policy without signs/symptoms or physical distress. Continue progressive overload as per policy without signs/symptoms or physical distress. Continue progressive overload as per policy without signs/symptoms or physical distress. Continue progressive overload as  per policy without signs/symptoms or physical distress.   Resistance Training   Training Prescription _0    Weight _1 Reps 10-12 10-12 10-12 10-12 10-12   Interval Training   Interval Training _2    Treadmill   MPH 3 3.3 3.3 3.3 3.3   Grade _3 Minutes _4 Recumbant Bike   Level _5 RPM 60 60 60 60 60   Minutes _6 NuStep   Level _7 Watts 80 80 80 80 70  80W was for the older style NS, Ahsan is now on newer style   Minutes _8 12/04/14 1636 12/11/14 1100 12/13/14 1000 12/15/14 1000 12/18/14 1000   Exercise Review   Progression _9    Response to Exercise   Blood Pressure (Admit) 124/70 mmHg 128/72 mmHg      Blood Pressure (Exercise) 150/70 mmHg 138/82 mmHg      Blood Pressure (Exit) 122/80 mmHg       Heart Rate (Admit) 86 bpm 77 bpm      Heart Rate (Exercise) 111 bpm 108 bpm      Heart Rate (Exit) 103 bpm       Oxygen Saturation (Admit) 96 % 97 %      Oxygen Saturation (Exercise) 95 % 94 %      Oxygen Saturation (Exit) 94 %       Rating of Perceived Exertion (Exercise) 13 15      Perceived Dyspnea (Exercise) 4 4      Symptoms _10    Comments   Wardell Honour with regular high intensity interval training. He was educated on proper HIIT training and how to progress with it.  Reviewed individualized exercise prescription and made increases per departmental policy. Exercise increases were discussed with the patient and they were able to perform the new work loads without issue (no signs or symptoms).  Reviewed individualized exercise prescription and made increases per departmental policy. Exercise increases were discussed with the patient and they were able to perform the new work loads without issue (no signs or symptoms).    Duration Progress to 30 minutes of continuous aerobic without signs/symptoms of physical distress Progress to 50 minutes of aerobic without signs/symptoms of physical distress Progress to 50 minutes of aerobic without signs/symptoms of physical distress Progress to 50 minutes of aerobic without signs/symptoms of physical distress Progress to 50 minutes of aerobic without signs/symptoms of physical distress   Intensity Rest + 30 THRR unchanged THRR unchanged THRR unchanged THRR unchanged   Progression Continue progressive overload as per policy  without signs/symptoms or physical distress. Continue progressive overload as per policy without signs/symptoms or physical distress. Continue progressive overload as per policy without signs/symptoms or physical distress. Continue progressive overload as per policy without signs/symptoms or physical distress. Continue progressive overload as per policy without signs/symptoms or physical distress.   Resistance Training   Training Prescription _11    Weight _12 Reps 10-12 10-15 10-15 10-15 10-15   Interval Training   Interval Training No No Yes Yes Yes   Equipment   NuStep;Recumbant Bike NuStep;Recumbant Bike NuStep;Recumbant Bike   Comments   NS and RB,  interval for 2-3 minutes with increase in speed and level. Drop back to moderate base intensity for 3-5 minutes. Specific workloads and times written out in exercise chart.  NS and RB, interval for 2-3 minutes with increase in speed and level. Drop back to moderate base intensity for 3-5 minutes. Specific workloads and times written out in exercise chart.  NS and RB, interval for 2-3 minutes with increase in speed and level. Drop back to moderate base intensity for 3-5 minutes. Specific workloads and times written out in exercise chart.    Treadmill   MPH 3.3 3.3 3.3 3.3 3.5  speeds between 3.3-3.5 mph   Grade _0 Minutes _1 Recumbant Bike   Level _2 RPM 60 60 60 60 60   Minutes _3 NuStep   Level _4 Watts 70  80W was for the older style NS, Ajit is now on newer style 92 92 90 90   Minutes _5 12/22/14 1200 01/22/15 1000 01/22/15 1316 01/31/15 1000 02/07/15 1243   Exercise Review   Progression Yes Yes Yes Yes    Response to Exercise   Blood Pressure (Admit)   126/78 mmHg  138/70 mmHg   Blood Pressure (Exercise)   150/80 mmHg  148/78 mmHg   Blood Pressure (Exit)   108/80 mmHg  112/68 mmHg   Heart Rate (Admit)   84 bpm  76 bpm   Heart Rate  (Exercise)   126 bpm  112 bpm   Heart Rate (Exit)   101 bpm  100 bpm   Oxygen Saturation (Admit)   97 %  93 %   Oxygen Saturation (Exercise)   93 %  94 %   Oxygen Saturation (Exit)   95 %  94 %   Rating of Perceived Exertion (Exercise)   15  15   Perceived Dyspnea (Exercise)   4  4   Symptoms _6    Comments Added an incline to the TM for [place] due to an increase in his stamina on the treadmill and to provide another form of progression for him.  Reviewed individual exercise increases with the patient made to his exercise prescription. He was able to tolerate increased workloads with no signs or symptoms.    Patient is approaching graduation of the program and home exercise plans were discussed. Details of the patient's exercise prescription and what they need to do in order to continue the prescription and progress with exercise were outlined and the patient verbalized understanding. The patient plans to complete all exercise at [facility]   Duration Progress to 50 minutes of aerobic without signs/symptoms of physical distress Progress to 50 minutes of aerobic without signs/symptoms of physical distress Progress to 50 minutes of aerobic without signs/symptoms of physical distress Progress to 50 minutes of aerobic without signs/symptoms of physical distress Progress to 50 minutes of aerobic without signs/symptoms of physical distress   Intensity _7    Progression Continue progressive overload as per policy without signs/symptoms or physical distress. Continue progressive overload as per policy without signs/symptoms or physical distress. Continue progressive overload as per policy without signs/symptoms or physical distress. Continue progressive overload as per policy without signs/symptoms or physical distress. Continue progressive overload as per policy without signs/symptoms  or physical distress.   Resistance  Training   Training Prescription _0    Weight _1 Reps 10-15 10-15 10-15 10-15 10-15   Interval Training   Interval Training _2    Equipment NuStep;Recumbant Bike NuStep;Recumbant Bike NuStep;Recumbant Bike NuStep;Recumbant Bike NuStep;Recumbant Bike   Comments NS and RB, interval for 2-3 minutes with increase in speed and level. Drop back to moderate base intensity for 3-5 minutes. Specific workloads and times written out in exercise chart.  NS and RB, interval for 2-3 minutes with increase in speed and level. Drop back to moderate base intensity for 3-5 minutes. Specific workloads and times written out in exercise chart.  NS and RB, interval for 2-3 minutes with increase in speed and level. Drop back to moderate base intensity for 3-5 minutes. Specific workloads and times written out in exercise chart.  NS and RB, interval for 2-3 minutes with increase in speed and level. Drop back to moderate base intensity for 3-5 minutes. Specific workloads and times written out in exercise chart.  NS and RB, interval for 2-3 minutes with increase in speed and level. Drop back to moderate base intensity for 3-5 minutes. Specific workloads and times written out in exercise chart.    Treadmill   MPH 3.5  speeds between 3.3-3.5 mph 3.5  speed consistantly at 3.5 3.5  speed consistantly at 3.5 3.5  speed consistantly at 3.5 3.5  speed consistantly at 3.5   Grade _3 Minutes _4 Recumbant Bike   Level _5 RPM 60 60 60 60 60   Minutes _6 NuStep   Level _7 Watts 90 100 100 120 120   Minutes _8 Discharge Exercise Prescription (Final Exercise Prescription Changes):     Exercise Prescription Changes - 02/07/15 1243    Response to Exercise   Blood Pressure (Admit) 138/70 mmHg   Blood Pressure (Exercise) 148/78 mmHg   Blood Pressure (Exit) 112/68 mmHg   Heart Rate (Admit) 76 bpm   Heart Rate  (Exercise) 112 bpm   Heart Rate (Exit) 100 bpm   Oxygen Saturation (Admit) 93 %   Oxygen Saturation (Exercise) 94 %   Oxygen Saturation (Exit) 94 %   Rating of Perceived Exertion (Exercise) 15   Perceived Dyspnea (Exercise) 4   Symptoms None   Comments Patient is approaching graduation of the program and home exercise plans were discussed. Details of the patient's exercise prescription and what they need to do in order to continue the prescription and progress with exercise were outlined and the patient verbalized understanding. The patient plans to complete all exercise at [facility]   Duration Progress to 50 minutes of aerobic without signs/symptoms of physical distress   Intensity THRR unchanged   Progression Continue progressive overload as per policy without signs/symptoms or physical distress.   Resistance Training   Training Prescription Yes   Weight 6   Reps 10-15   Interval Training   Interval Training Yes   Equipment NuStep;Recumbant Bike   Comments NS and RB, interval for 2-3 minutes with increase in speed and level. Drop back to moderate base intensity for 3-5 minutes. Specific workloads and times written out in exercise chart.    Treadmill   MPH 3.5  speed consistantly at 3.5   Grade 4   Minutes 15   Recumbant Bike   Level 5   RPM 60   Minutes 15   NuStep   Level 8   Watts 120   Minutes 15       Nutrition:  Target Goals: Understanding of nutrition guidelines, daily intake of sodium <156m, cholesterol <2086m calories 30% from fat and 7% or less from saturated fats, daily to have 5 or more servings of fruits and vegetables.  Biometrics:     Pre Biometrics - 10/19/14 1012    Pre Biometrics   Height 6' 1" (1.854 m)   Weight 236 lb 8 oz (107.276 kg)   Waist Circumference 43.5 inches   Hip Circumference 44 inches   Waist to Hip Ratio 0.99 %   BMI (Calculated) 31.3       Nutrition Therapy Plan and Nutrition Goals:     Nutrition Therapy & Goals -  11/20/14 1526    Nutrition Therapy   Diet Instructed patient on a 2000 calorie meal plant to promote weight loss including general dietary guidelines for lung health and DASH diet principles   Fiber 20 grams   Whole Grain Foods 3 servings   Protein 8 ounces/day   Saturated Fats 13 max. grams   Fruits and Vegetables 5 servings/day   Personal Nutrition Goals   Personal Goal #1 To work on a more consistent pattern of eating with 3 meals per day spaced 4-6 hours apart. Include only 1 evening snack vs. grazing.   Personal Goal #2 Decrease sweetened tea from 1/2 gallon per day to 1-2 (10 oz) glasses at meals.   Personal Goal #3 Try 1% milk and limit milk products to 3 per day including yogurt and cheese (no more than once daily).   Personal Goal #4 Read food labels for sodium with goal of no more than [id number] per meal.   Additional Goals? Yes   Personal Goal #5 Add steamed vegetables and fruit to "take out" sandwich meals vs. fries or chips.   Personal Goal #6 Increase water intake. Keep bottled water with you.      Nutrition Discharge: Rate Your Plate Scores:     Rate Your Plate - 11/62/69/4835462 Rate Your Plate Scores   Pre Score 55   Pre Score % 61 %      Psychosocial: Target Goals: Acknowledge presence or absence of depression, maximize coping skills, provide positive support system. Participant is able to verbalize types and ability to use techniques and skills needed for reducing stress and depression.  Initial Review & Psychosocial Screening:     Initial Psych Review & Screening - 10/17/14 1130    Family Dynamics   Good Support System? Yes   Comments Mr SmiPolis good support from his 3 children and wife. He is managing his COPD well with pacing through activities. 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   Barriers   Psychosocial barriers to participate in program There are no identifiable barriers or  psychosocial needs.;The patient should benefit from training in stress management and relaxation.   Screening Interventions   Interventions Encouraged to exercise      Quality of Life Scores:     Quality of Life - 01/29/15 1000    Quality of Life Scores   Health/Function Post 22.13 %   Health/Function % Change 28.38 %   Socioeconomic Post  24.17 %   Socioeconomic % Change 42.16 %   Psych/Spiritual Post 25 %   Psych/Spiritual % Change 11.11 %   Family Post 27.6 %   Family % Change 21.05 %   GLOBAL Post 23.85 %   GLOBAL % Change 25.62 %      PHQ-9:     Recent Review Flowsheet Data    Depression screen [facility] 2/9 01/29/2015 10/17/2014   Decreased Interest 1 1   Down, Depressed, Hopeless 0 0   PHQ - 2 Score 1 1      Psychosocial Evaluation and Intervention:     Psychosocial Evaluation - 02/07/15 1049    Discharge Psychosocial Assessment & Intervention   Comments Counselor follow up with Mr. Vanvranken today prior to discharge from this program next week.  He reports although he has not quit smoking entirely, he has cut back significantly since the beginning and has noticed feeling stronger and moving with greater ease.  Mr. Fjelstad plans to work out at [employer] where he is a member following discharge, and he is encouraged to begin that process now to maintain consistency in exercise.  He states this program has been positive for him and he has some "sadness" for it to be ending since he has experienced benefits from the exercise as well as the educational components.  Mr. Jabs has been dancing more and when pacing himself he is able to finish a song now, which was one of his goals.  Counselor commended him on his progress and encouraged to keep up the good work!       Psychosocial Re-Evaluation:     Psychosocial Re-Evaluation      11/27/14 1058 01/17/15 1128         Psychosocial Re-Evaluation   Comments Follow up with Mr. Gladd today reporting struggling more to breathe  now that the weather is colder.  He states he was able to dance recently most of a song, although he definitely had to pace himself more than in the past.  This is an improvement and is one of his goals for this program.  Counselor commended Mr. Mcfadyen on this.  He admits to continuing to struggle with smoking cessation and this has "set him back" a bit from accomplishing his goals.  Discussed his plan to get back on track as well as his nutrition plan following meeting with the dietician.  Mr. Barthold is aware that stress is a "trigger" for him and has alternative ways to cope without smoking.  Counselor will continue to follow with him.   Follow up with Mr. Franchino today reporting been out of class awhile due to sickness and a death recently of a close friend.  Mr. Tauzin reports he continues to struggle with smoking - mostly when he is in social situations.  He continues to go dancing on Friday nights and has noticed improved stamina and energy.  Counselor discussed ways to help with social pressure to smoke and Mr. Preslar agreed that the "gum" had helped but he was out of the  Rx.  He was encouraged to get that filled in order to promote success in this area.  Counselor will follow as needed.         Education: Education Goals: Education classes will be provided on a weekly basis, covering required topics. Participant will state understanding/return demonstration of topics presented.  Learning Barriers/Preferences:     Learning Barriers/Preferences - 10/17/14 1130    Learning Barriers/Preferences   Learning  Barriers None   Learning Preferences Group Instruction;Individual Instruction;Pictoral;Skilled Demonstration;Verbal Instruction;Video;Written Material      Education Topics: Initial Evaluation Education: - Verbal, written and demonstration of respiratory meds, RPE/PD scales, oximetry and breathing techniques. Instruction on use of nebulizers and MDIs: cleaning and proper use, rinsing mouth with  steroid doses and importance of monitoring MDI activations.          Pulmonary Rehab from 02/05/2015 in [facility] Cardiac and Pulmonary Rehab   Date  10/17/14   Educator  LB    Instruction Review Code  2- meets goals/outcomes      General Nutrition Guidelines/Fats and Fiber: -Group instruction provided by verbal, written material, models and posters to present the general guidelines for heart healthy nutrition. Gives an explanation and review of dietary fats and fiber.      Pulmonary Rehab from 01/31/2015 in [facility] Cardiac and Pulmonary Rehab   Date  12/11/14   Educator  CR   Instruction Review Code  2- meets goals/outcomes      Controlling Sodium/Reading Food Labels: -Group verbal and written material supporting the discussion of sodium use in heart healthy nutrition. Review and explanation with models, verbal and written materials for utilization of the food label.      Pulmonary Rehab from 02/05/2015 in [facility] Cardiac and Pulmonary Rehab   Date  11/17/14   Educator  Candiss Norse    Instruction Review Code  2- meets goals/outcomes       Aerobic Exercise & Resistance Training: - Gives group verbal and written discussion on the health impact of inactivity. On the components of aerobic and resistive training programs and the benefits of this training and how to safely progress through these programs.      Pulmonary Rehab from 02/05/2015 in [facility] Cardiac and Pulmonary Rehab   Date  01/31/15   Educator  SW   Instruction Review Code  2- meets goals/outcomes      Flexibility, Balance, General Exercise Guidelines: - Provides group verbal and written instruction on the benefits of flexibility and balance training programs. Provides general exercise guidelines with specific guidelines to those with heart or lung disease. Demonstration and skill practice provided.      Pulmonary Rehab from 02/05/2015 in [facility] Cardiac and Pulmonary Rehab   Date  11/15/14   Educator  SW   Instruction Review Code   2- meets goals/outcomes      Stress Management: - Provides group verbal and written instruction about the health risks of elevated stress, cause of high stress, and healthy ways to reduce stress.      Pulmonary Rehab from 02/05/2015 in [facility] Cardiac and Pulmonary Rehab   Date  11/08/14   Educator  [facility]   Instruction Review Code  2- meets goals/outcomes      Depression: - Provides group verbal and written instruction on the correlation between heart/lung disease and depressed mood, treatment options, and the stigmas associated with seeking treatment.      Pulmonary Rehab from 02/05/2015 in [facility] Cardiac and Pulmonary Rehab   Date  01/17/15 Marcelina Morel attended 01/03/15]   Educator  [facility]   Instruction Review Code  2- meets goals/outcomes      Exercise & Equipment Safety: - Individual verbal instruction and demonstration of equipment use and safety with use of the equipment.      Pulmonary Rehab from 02/05/2015 in [facility] Cardiac and Pulmonary Rehab   Date  10/20/14   Educator  [place]   Instruction Review Code  2- meets goals/outcomes  Infection Prevention: - Provides verbal and written material to individual with discussion of infection control including proper hand washing and proper equipment cleaning during exercise session.      Pulmonary Rehab from 02/05/2015 in [facility] Cardiac and Pulmonary Rehab   Date  10/20/14   Educator  [place]   Instruction Review Code  2- meets goals/outcomes      Falls Prevention: - Provides verbal and written material to individual with discussion of falls prevention and safety.      Pulmonary Rehab from 02/05/2015 in [facility] Cardiac and Pulmonary Rehab   Date  10/17/14   Educator  LB   Instruction Review Code  2- meets goals/outcomes        Chronic Lung Diseases: - Group verbal and written instruction to review new updates, new respiratory medications, new advancements in procedures and treatments. Provide informative websites and "800" numbers of self-education.       Pulmonary Rehab from 02/05/2015 in [facility] Cardiac and Pulmonary Rehab   Date  12/15/14 [facility]]   Educator  CE   Instruction Review Code  2- meets goals/outcomes      Lung Procedures: - Group verbal and written instruction to describe testing methods done to diagnose lung disease. Review the outcome of test results. Describe the treatment choices: Pulmonary Function Tests, ABGs and oximetry.      Pulmonary Rehab from 02/05/2015 in [facility] Cardiac and Pulmonary Rehab   Date  11/10/14   Educator  [place]   Instruction Review Code  2- meets goals/outcomes      Energy Conservation: - Provide group verbal and written instruction for methods to conserve energy, plan and organize activities. Instruct on pacing techniques, use of adaptive equipment and posture/positioning to relieve shortness of breath.      Pulmonary Rehab from 02/05/2015 in [facility] Cardiac and Pulmonary Rehab   Date  11/22/14   Educator  SW   Instruction Review Code  2- meets goals/outcomes      Triggers: - Group verbal and written instruction to review types of environmental controls: home humidity, furnaces, filters, dust mite/pet prevention, HEPA vacuums. To discuss weather changes, air quality and the benefits of nasal washing.      Pulmonary Rehab from 02/05/2015 in [facility] Cardiac and Pulmonary Rehab   Date  01/22/15   Educator  LB   Instruction Review Code  2- meets goals/outcomes      Exacerbations: - Group verbal and written instruction to provide: warning signs, infection symptoms, calling MD promptly, preventive modes, and value of vaccinations. Review: effective airway clearance, coughing and/or vibration techniques. Create an Sports administrator.      Pulmonary Rehab from 02/05/2015 in [facility] Cardiac and Pulmonary Rehab   Date  12/18/14   Educator  LB   Instruction Review Code  2- meets goals/outcomes       Respiratory Medications: - Group verbal and written instruction to review medications for lung disease. Drug  class, frequency, complications, importance of spacers, rinsing mouth after steroid MDI's, and proper cleaning methods for nebulizers.      Pulmonary Rehab from 02/05/2015 in [facility] Cardiac and Pulmonary Rehab   Date  10/17/14   Educator  LB   Instruction Review Code  2- meets goals/outcomes        Breathing Retraining: - Provides individuals verbal and written instruction on purpose, frequency, and proper technique of diaphragmatic breathing and pursed-lipped breathing. Applies individual practice skills.      Pulmonary Rehab from 02/05/2015 in [facility] Cardiac and Pulmonary  Rehab   Date  10/20/14   Educator  [place]   Instruction Review Code  2- meets goals/outcomes       Heart Failure: - Group verbal and written instruction on the basics of heart failure: signs/symptoms, treatments, explanation of ejection fraction, enlarged heart and cardiomyopathy.      Pulmonary Rehab from 02/05/2015 in [facility] Cardiac and Pulmonary Rehab   Date  01/26/15   Educator  [place]   Instruction Review Code  2- meets goals/outcomes        Relaxation: - Provides group, verbal and written instruction about the benefits of relaxation for patients with heart/lung disease. Also provides patients with examples of relaxation techniques.      Pulmonary Rehab from 02/05/2015 in [facility] Cardiac and Pulmonary Rehab   Date  12/06/14   Educator  [facility]   Instruction Review Code  2- Meets goals/outcomes      Knowledge Questionnaire Score:     Knowledge Questionnaire Score - 01/29/15 1000    Knowledge Questionnaire Score   Post Score -2      Personal Goals and Risk Factors at Admission:     Personal Goals and Risk Factors at Admission - 11/03/14 1058    Personal Goals and Risk Factors on Admission   Lipids Yes  On meds for cholesterol control.   Goal Cholesterol controlled with medications as prescribed, with individualized exercise RX and with personalized nutrition plan. Value goals: LDL < 46m, HDL > 413m Participant  states understanding of desired cholesterol values and following prescriptions.   Intervention Provide nutrition & aerobic exercise along with prescribed medications to achieve LDL <7057mHDL >72m69m    Personal Goals and Risk Factors Review:      Goals and Risk Factor Review      10/27/14 1417 10/30/14 1000 11/03/14 1054 11/03/14 1059 11/03/14 1111   Weight Management   Goals Progress/Improvement seen   No     Comments   No weight change noted yet  5 sessions attended. To make appointment with RD. HAs cut down on milk consumption,and is working on prtion control. Still need uidance on calories/fat/portion control. Has started working on changing cereals from "sugar Crack" to whole grain cereals. HAs decreased sweets intake too.     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      Quit Smoking   Goals Progress/Improvement seen Yes Yes      Comments Mr. SmithMattynot had a cigarette in 1 week. Mr SmithWearets he smokes occasionally and with stress. His trip was for legal matters and was very stressful, but he did not smoke there and states he has not had a cigarrette since his start date in [place], [place].      Breathing Techniques   Goals Progress/Improvement seen   Yes   --  Auron Khamarionbreathing tests done this past Wednesday at the referring MD office. He was told his numbers are improved.   Comments  Mr SmithMerricksing PLB with his exercise goals and activites at home and states that it is helpful for shortness of breath.      Hypertension   Goal   --  Rosalio Walther not have HTN listed as a risk  factor.     Abnormal Lipids   Progress seen towards goals    Unknown    Comments    On meds for cholesterol control. Is working on eating habits,waiting to see the  RD for advice.      11/06/14 1501 11/08/14 1000 11/17/14 1240 11/17/14 1454 11/20/14 1000   Weight Management   Goals Progress/Improvement seen   No     Comments   No weight changes since last spoke with Eddie Dibbles. He has made  a few nutriotin changes. Added yogurt to his meals, continues to decrease milk intake. Added iced tea, then decided the sugar in the tea might not be a great change. Morey finds that he grazes after dinner and ends up snacking all evening while watching TV. HE has committed to spending 2 nights a week working on a project in his shop to curb his Grazing" after dinner.  He has set a goal to lose 2 pounds in the next two weeks.     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 This is when it is difficult to not smoke when around other people are smoking.      Understand more about Heart/Pulmonary Disease   Goals Progress/Improvement seen  Yes    Yes   Comments Educated Mr Crosson on his PFT and related it to his COPD. Educated him on the goal to maintain his Spirometry results, prevent them from worsening, as well as his COPD, by exercising, smoking cessation, using his MDI's, good diet, and wearing oxygen if needed.    In the past, Mr Tremont modified his activity to adjust for his shortness of breath and COPD. He loves to [place] dance and made a song that was only 30mn long. Last Friday, he was able to shang dance with his wife for 3-4 mins which was a big accomplishment and very important to him.   Improve shortness of breath with ADL's   Goals Progress/Improvement seen   Yes  Yes    Comments  Mr SMcleanwas having a "bad breathing day". He had mowed the other day and inhaled dusty air. We talked about hime wearing a mask with yardwork, nasal washing after the work, and using his inhaler for rescue.  Mr. SSeeleysays he is not using as much Proventil since he has been in rehab.  He  is not running out before the month's end.  He is also doing more. He is helping his son remodel an old home.    Breathing Techniques   Goals Progress/Improvement seen     Yes    Comments    Mr. SCivellois using proper technique for PLB while on the TM.    Increase knowledge of respiratory medications   Goals Progress/Improvement seen   Yes  Yes    Comments  Mr SBernardinihas a good understanding of his MDI's. We discussed using his Albuterol MDI before exercise in [facility] and the importance of Spiriva with exercise.  Mr. SRathboneis taking Spiriva, Symbicort and Proventil MDIs.  He is rinsing his mouth after taking Spiriva and Symbicort.    Hypertension   Goal   Participant will see blood pressure controlled within the values of 140/96mHg or within value directed by their physician.     Progress seen toward goals   Yes     Comments  Geoffery states he is not on any medications presently for BP control. His documented BP readings are in normal range.      Abnormal Lipids   Progress seen towards goals   Unknown     Comments   HAs an appt with the RD next week. 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 Added yogurt to his meals since last time I spoke with him.        11/27/14 1111 11/29/14 1538 12/04/14 1051 12/04/14 1611 12/06/14 1102   Weight Management   Goals Progress/Improvement seen  Yes      Comments  Wiliam has added some project time to his evenings. He helped on a project that exposed him to "asbestos" dust. He has been sick since this exposure and he was advised to call his MD if he continues to be sick. Dahl has removed all the junk food from his house since we last spoke and has added more fruit and vegetables to his daily meals. He is down over 3 pounds since making the changes.       Increase Aerobic Exercise and Physical Activity   Goals Progress/Improvement seen  Yes  Yes  Yes   Comments Meziah used all his inhalers the other day so [name], RN instructed him to  call his MD. Today he reported he has limited his cigarette smoking except Thursday he smoked 14 cigarettes. He has also been around asbestors.   Patient reported that walking from his car to the pulmonary rehab gym is now much easier than it was on the first day he came to rehab. He stated that his exercise prescription is challenging, but on a good breathing day he does increase his workload slightly to continually challenge himself.   Ger said that the treadmill is the hardest machine for him, but he has noticed his hard work on the treadmill paying off as he can walk easier and faster without illiciting symptoms. He is very encouraged by this progress and attributes it to his consistent attendance in [place].   Quit Smoking   Goals Progress/Improvement seen    Yes    Comments    Mr Perleberg has not smoked since 11/27/2014, even over the Holiday's. He is very determined not to smoke and can feel a difference with his breathing since he has quit.    Improve shortness of breath with ADL's   Goals Progress/Improvement seen  No       Comments Yves used all his inhalers the other day so [name], RN instructed him to call his MD. Today he reported he has limited his cigarette smoking except Thursday he smoked 14 cigarettes. He has also been around asbestors.          12/13/14 1000 12/13/14 1055 12/15/14 1421 12/18/14 1516 01/02/15 0719   Weight Management   Comments     Mr Westrup has made some dietitary changes for better nutritional habits. He is maintaining his weight at 236 to 239 in [place].   Increase Aerobic Exercise and Physical Activity   Goals Progress/Improvement seen  Yes Yes      Comments Ms Bougher started an interval training session on the Erin. He has the goal to dance shag for a 3 minute song. HIs levels on the RB are 6-9 and his rpm 60-75. Discussed interval training with Eddie Dibbles. He has the goal of being able to complete a 3 minute high intensity shag dance. He will do 2-3 minute  intervals on  the NS and RB. He will increase in level and speed and then drop back to a moderate base intensity for 3-5 minutes.       Quit Smoking   Goals Progress/Improvement seen No   Yes    Comments Mr Wolke has smoked 2 cigarrettes so far this week.   Mr Soltys only had 2 cigarettes this weekend. He had been with a friend and had the cigarettes. We talked about using a nicotine replacement which he does have the gum, but he wants to quit without  any nicotine substitute.    Understand more about Heart/Pulmonary Disease   Goals Progress/Improvement seen      Yes   Comments     Mr Hornig has acquired increased knowledge of COPD which has helped him manage his daily life with COPD, especially increased exercise and proper use of his inhalers.   Improve shortness of breath with ADL's   Goals Progress/Improvement seen    Yes     Comments   Eydan is still having to use his Albuterol more than directed and is running out before the end of the month.  Instructed him to call MD and notify him of this.  He is able to walk further distances and dance a little since beginning [facility] and is walking on the TM with an incline.     Breathing Techniques   Goals Progress/Improvement seen    Yes     Comments   Mr. Chadderdon is using PLB when on the equipment in [facility], espically on the TM.     Increase knowledge of respiratory medications   Goals Progress/Improvement seen    Yes     Comments   Mr. Bergland is no longer taking Combivent.  He does have a spacer and using it with his proventil and symbicort.     Hypertension   Progress seen toward goals     Yes   Comments     Acceptable BP readings in [facility].   Abnormal Lipids   Progress seen towards goals     Yes   Comments     Mr Dunson is compliant with his provastatin and has made some nutritional changes.     01/03/15 1502 01/03/15 1509 01/17/15 1000 01/19/15 1422 01/19/15 1539   Weight Management   Goals Progress/Improvement seen     Yes   Comments     Caelen continues  to maintain at 236 pounds. He said the holidays and the being snowed in has hurt his eating habits.  He continues to work on decreasing his sugar intake.  "I do not add sugar to anything."He is grazing less after dinner.    Increase Aerobic Exercise and Physical Activity   Goals Progress/Improvement seen  Yes       Comments Discussed with Mr Bensinger about the Independent [employer] and our Scholarship program. He is very interested and does like the [employer].       Quit Smoking   Goals Progress/Improvement seen No  No Yes    Comments Mr Pabst had a bad day yesterday. 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 I gave him information on the VOKE nicotine replacement  inhaler which could be a substite for a cigarrettte in situations like this. He is planning on research it. The Voke was to be approved by the FDA this year.  Mr Andreoni did smoke one cigarrette this morning.  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  Mr. Feldhaus did not smoke at all yesterday.  He has had one cigarette today.    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     Breathing Techniques   Goals Progress/Improvement seen     Yes    Comments    Mr. Lofaro is showing proper technique with PLB while on the TM.    Increase knowledge of respiratory medications   Goals Progress/Improvement seen   Yes      Comments  Discussed with Mr Leverich about buying an oximeter which would be helpful to monitor his O2Sat's with activites at home.      Hypertension   Goal     Participant will see blood pressure controlled within the values of 140/49m/Hg or within value directed by their physician.   Progress seen toward goals     Yes   Comments     continues  with normal BP readings.      01/22/15 1008 01/22/15 1546 01/24/15 1511 01/29/15 1458 01/31/15 1446   Quit Smoking   Goals Progress/Improvement seen  Yes No Yes Yes   Comments  Mr SRopphas not had a cigarrette since Friday! 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 I talked again about nicotine replacement, but Mr SSavagedoes not want to use the repalcement. He has to get his cessation in his "mind"  and not smoke. No cigarrettes since Thursday! Still no cigarrettes since last Thursday; Mr sstiffstates he feels better with his exercise without the cigarrettes.   Improve shortness of breath with ADL's   Goals Progress/Improvement seen  No       Comments SOB has not improved, however, it is maintaining where its at currently.  Hopes continuing exercise will improve this as time goes on.         02/02/15 1051 02/05/15 1000 02/07/15 1000 02/07/15 1448     Increase Aerobic Exercise and Physical Activity   Goals Progress/Improvement seen    Yes     Comments   Mr STuranostates he has increased his stamina with activities. He has a better understanding of his limits due to his COPD and continues to do some of his activities with pacing. He has modified his shag dancing and moves his wife more rather himself. 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 He is committed to exercise at [employer] [facility].                   tha   imsself and he Mr STheilerstates he has increased his stamina with activities. He has a better understanding of his limits due to his COPD and continues to do some of his activities with pacing. He has modified his shag dancing and moves his wife more rather himself. 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 He is committed to exercise at [employer] [facility].  Quit  Smoking   Goals Progress/Improvement seen Yes Yes      Comments Still has not had any cigarrettes since last Thursday and is feeling good about staying strong for so many days in a row.  Ms Coopersmith has still not had a cigarrette since 2 Thursdays ago.  Mr Liptak had a few cigarrettes at the job site - this will be his biggest challenge with smoking cessation, and he does not want to use any nicotine replacement.     Improve shortness of breath with ADL's   Goals Progress/Improvement seen   Yes      Comments  mr Neuharth has improved his shortness of breath throughout his participation in [place]. He still has good and bad days which he understands as a part of COPD and paces himself with activites, including his shag dancing.      Breathing Techniques   Goals Progress/Improvement seen   Yes      Comments  Mr Bischoff has good technique and knows the importance of using PLB.      Increase knowledge of respiratory medications   Goals Progress/Improvement seen   Yes      Comments  Mr Moctezuma has a good understanding of his inhalers and his spacer.       Hypertension   Comments  Mr Phillis has maintained acceptable BP reading in [facility] and is compliant with his medication.      Abnormal Lipids   Comments  Mr Lucchesi is compliant with his provastatin and has made some nutritional changes.         Personal Goals Discharge (Final Personal Goals and Risk Factors Review):      Goals and Risk Factor Review - 02/07/15 1448    Increase Aerobic Exercise and Physical Activity   Comments Mr Fenter states he has increased his stamina with activities. He has a better understanding of his limits due to his COPD and continues to do some of his activities with pacing. He has modified his shag dancing and moves his wife more rather himself. He has modified his work at his [employer] site and knows the harmfulness of the environment on his lungs. He is committed to exercise at [employer] after [facility].                          ITP Comments:     ITP Comments      12/13/14 1000 12/15/14 1427 12/18/14 1029 12/29/14 1401 01/03/15 1116   ITP Comments Estimated timetable to achieve ITP goals is 16 more sessions. Mr. Franchino had blood work and an x-ray yesterday.  He will be out on Wednesday for an oncologist visit at [facility]. ITP goals anticipated to be met in 16 more sessions.  ITP goals anticipated to be met in 13 more sessions. Personal and exercise goals expected to be met in12 more sessions. Progress on specific individualized goals will be charted in patient's ITP. Upon completion of the program the patient will be comfortable managing exercise goals and progression on their own     01/17/15 1056 01/19/15 1121 01/19/15 1422 01/22/15 1050 01/24/15 1012   ITP Comments Personal and exercise goals expected to be met in 11 more sessions. Progress on specific individualized goals will be charted in patient's ITP. Upon completion of the program the patient will be comfortable managing exercise goals and progression on their own.  Personal and exercise goals expected to be met in 26  more sessions. Progress on specific individualized goals will be charted in patient's ITP. Upon completion of the program the patient will be comfortable managing exercise goals and progression on their own.  Personal and exercise goals expected to be met in 10 more sessions. Progress on specific individualized goals will be charted in patient's ITP. Upon completion of the program the patient will be comfortable managing exercise goals and progression on their own.  Personal and exercise goals anticipated to be met in 9 more sessions.  Personal and exercise goals expected to be met in more 7 sessions. Progress on specific individualized goals will be charted in patient's ITP. Upon completion of the program the patient will be comfortable managing exercise goals and progression on their own.      01/26/15 1058 01/31/15 1054 02/02/15 1051  02/05/15 1056 02/07/15 1113   ITP Comments Personal and exercise goals expected to be met in 1  more sessions. Progress on specific individualized goals will be charted in patient's ITP. Upon completion of the program the patient will be comfortable managing exercise goals and progression on their own.  Personal and exercise goals will be completed within 7 more sessions.  Specific goal progression will be noted in ITP. Personal and exercise goals expected to be met in 4 more sessions. Progress on specific individualized goals will be charted in patient's ITP. Upon completion of the program the patient will be comfortable managing exercise goals and progression on their own.  Personal and exercise goals are expected to be met in 3 more sessions. See ITP for specific goal progress.  Personal and exercise goals expected to be met in 2 more sessions. Progress on specific individualized goals will be charted in patient's ITP. Upon completion of the program the patient will be comfortable managing exercise goals and progression on their own.       Comments: 30 day note review

## 2015-03-07 ENCOUNTER — Encounter: Payer: Medicaid Other | Attending: Internal Medicine

## 2015-03-07 DIAGNOSIS — J449 Chronic obstructive pulmonary disease, unspecified: Secondary | ICD-10-CM | POA: Insufficient documentation

## 2015-03-13 NOTE — Addendum Note (Signed)
Addended by: Karie Fetch on: 03/13/2015 11:05 AM   Modules accepted: Orders

## 2015-03-13 NOTE — Progress Notes (Signed)
Pulmonary Individual Treatment Plan  Patient Details  Name: Amire Leazer MRN: [id number] Date of Birth: 1955/01/07 Referring Provider:  Ines Bloomer, MD  Initial Encounter Date: 10/17/2014  Visit Diagnosis: COPD, moderate ([place])       ADL [facility]      10/17/14 1130 12/06/14 1000 01/29/15 1504   ADL [facility]   ADL Phase Entry Mid Exit   SOB Score total 48 77 47   Rest 1 2 1    Walk 3 3 2    Stairs 4 4 3    Bath 2 3 2    Dress 0 2 1   Shop 0 3 1            Pulmonary Function Assessment - 10/17/14 1130    Pulmonary Function Tests   RV% 270 %   DLCO% 67 %   Initial Spirometry Results   FVC% 45.9 %   FEV1% 23 %   FEV1/FVC Ratio 40   Post Bronchodilator Spirometry Results   FVC% 45.1 %   FEV1% 34 %   FEV1/FVC Ratio 40       Exercise Program Goal: Individual exercise prescription set with THRR, safety & activity barriers. Participant demonstrates ability to understand and report RPE using BORG scale, to self-measure pulse accurately, and to acknowledge the importance of the exercise prescription.  Exercise Prescription Goal: Starting with aerobic activity 30 plus minutes a day, 3 days per week for initial exercise prescription. Provide home exercise prescription and guidelines that participant acknowledges understanding prior to discharge.       Activity Barriers & Cardiac Risk Stratification - 10/17/14 1130    Activity Barriers & Cardiac Risk Stratification   Activity Barriers Back Problems;Shortness of Breath;Deconditioning;Muscular Weakness   Cardiac Risk Stratification Moderate           6 Minute Walk      10/19/14 1007 12/11/14 1053 02/07/15 1546   6 Minute Walk   Phase Initial Mid Program Discharge   Distance 1340 feet 1500 feet 1400 feet   Walk Time 6 minutes 6 minutes 6 minutes   RPE 9 13 13    Perceived Dyspnea  2 3 3    Symptoms No No No   Resting HR 81 bpm 77 bpm 76 bpm   Resting BP 122/64 mmHg 128/72 mmHg 138/70 mmHg   Max Ex. HR 91 bpm 101 bpm  98 bpm   Max Ex. BP 142/62 mmHg 138/82 mmHg 140/78 mmHg           Initial Exercise Prescription - 10/19/14 1000    Date of Initial Exercise Prescription   Date 10/17/14   Treadmill   MPH 2.5   Grade 0   Minutes 10   Recumbant Bike   Level 2   RPM 40   Watts 20   Minutes 10   NuStep   Level 3   Watts 50   Minutes 10   Arm Ergometer   Level 1   Watts 10   Minutes 10   Recumbant Elliptical   Level 1   RPM 40   Watts 20   Minutes 10   Elliptical   Level 1   Speed 3   Minutes 1   REL-XR   Level 3   Watts 50   Minutes 10   Prescription Details   Frequency (times per week) 3   Duration Progress to 30 minutes of continuous aerobic without signs/symptoms of physical distress   Intensity   THRR REST +  30   Ratings  of Perceived Exertion 11-15   Perceived Dyspnea 2-4   Progression   Progression Continue progressive overload as per policy without signs/symptoms or physical distress.   Resistance Training   Training Prescription Yes   Weight 2   Reps 10-15        Discharge Exercise Prescription (Final Exercise Prescription Changes):     Exercise Prescription Changes - 02/07/15 1243    Response to Exercise   Blood Pressure (Admit) 138/70 mmHg   Blood Pressure (Exercise) 148/78 mmHg   Blood Pressure (Exit) 112/68 mmHg   Heart Rate (Admit) 76 bpm   Heart Rate (Exercise) 112 bpm   Heart Rate (Exit) 100 bpm   Oxygen Saturation (Admit) 93 %   Oxygen Saturation (Exercise) 94 %   Oxygen Saturation (Exit) 94 %   Rating of Perceived Exertion (Exercise) 15   Perceived Dyspnea (Exercise) 4   Symptoms None   Comments Patient is approaching graduation of the program and home exercise plans were discussed. Details of the patient's exercise prescription and what they need to do in order to continue the prescription and progress with exercise were outlined and the patient verbalized understanding. The patient plans to complete all exercise at [facility]   Duration  Progress to 50 minutes of aerobic without signs/symptoms of physical distress   Intensity THRR unchanged   Progression   Progression Continue progressive overload as per policy without signs/symptoms or physical distress.   Resistance Training   Training Prescription (read-only) Yes   Weight (read-only) 6   Reps (read-only) 10-15   Interval Training   Interval Training Yes   Equipment NuStep;Recumbant Bike   Comments NS and RB, interval for 2-3 minutes with increase in speed and level. Drop back to moderate base intensity for 3-5 minutes. Specific workloads and times written out in exercise chart.    Treadmill   MPH (read-only) 3.5  speed consistantly at 3.5   Grade (read-only) 4   Minutes (read-only) 15   Recumbant Bike   Level (read-only) 5   RPM (read-only) 60   Minutes (read-only) 15   NuStep   Level (read-only) 8   Watts (read-only) 120   Minutes (read-only) 15       Nutrition:  Target Goals: Understanding of nutrition guidelines, daily intake of sodium <1513m, cholesterol <2068m calories 30% from fat and 7% or less from saturated fats, daily to have 5 or more servings of fruits and vegetables.       Pre Biometrics - 10/19/14 1012    Pre Biometrics   Height 6' 1"  (1.854 m)   Weight 236 lb 8 oz (107.276 kg)   Waist Circumference 43.5 inches   Hip Circumference 44 inches   Waist to Hip Ratio 0.99 %   BMI (Calculated) 31.3            Nutrition Therapy & Goals - 11/20/14 1526    Nutrition Therapy   Diet Instructed patient on a 2000 calorie meal plant to promote weight loss including general dietary guidelines for lung health and DASH diet principles   Protein (oz) (read-only) 8 ounces/day   Fiber 20 grams   Whole Grain Foods 3 servings   Saturated Fats 13 max. grams   Fruits and Vegetables 5 servings/day   Personal Nutrition Goals   Personal Goal #1 To work on a more consistent pattern of eating with 3 meals per day spaced 4-6 hours apart. Include only 1  evening snack vs. grazing.   Personal Goal #2 Decrease sweetened  tea from 1/2 gallon per day to 1-2 (10 oz) glasses at meals.   Personal Goal #3 Try 1% milk and limit milk products to 3 per day including yogurt and cheese (no more than once daily).   Personal Goal #4 Read food labels for sodium with goal of no more than 672m sodium per meal.   Additional Goals? Yes   Personal Goal #5 Add steamed vegetables and fruit to "take out" sandwich meals vs. fries or chips.   Personal Goal #6 Increase water intake. Keep bottled water with you.           Nutrition Assessments - 11/20/14 1533    Rate Your Plate Scores   Pre Score 55   Pre Score % 61 %      Psychosocial: Target Goals: Acknowledge presence or absence of depression, maximize coping skills, provide positive support system. Participant is able to verbalize types and ability to use techniques and skills needed for reducing stress and depression.       Initial Psych Review & Screening - 10/17/14 1130    Family Dynamics   Good Support System? Yes   Comments Mr SBirniehas good support from his 3 children and wife. He is managing his COPD well with pacing through activities. 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   Barriers   Psychosocial barriers to participate in program There are no identifiable barriers or psychosocial needs.;The patient should benefit from training in stress management and relaxation.   Screening Interventions   Interventions Encouraged to exercise           Quality of Life - 01/29/15 1000    Quality of Life Scores   Health/Function Post 22.13 %   Health/Function % Change 28.38 %   Socioeconomic Post 24.17 %   Socioeconomic % Change 42.16 %   Psych/Spiritual Post 25 %   Psych/Spiritual % Change 11.11 %   Family Post 27.6 %   Family % Change 21.05 %   GLOBAL Post 23.85 %   GLOBAL % Change 25.62 %           Recent Review  Flowsheet Data    [facility] 01/29/2015 10/17/2014   Decreased Interest 1 1   Down, Depressed, Hopeless 0 0   PHQ - 2 Score 1 1           Psychosocial Evaluation - 02/07/15 1049    Discharge Psychosocial Assessment & Intervention   Comments Counselor follow up with Mr. SGraefftoday prior to discharge from this program next week.  He reports although he has not quit smoking entirely, he has cut back significantly since the beginning and has noticed feeling stronger and moving with greater ease.  Mr. SPhungplans to work out at [employer] [facility] a member following discharge, and he is encouraged to begin that process now to maintain consistency in exercise.  He states this program has been positive for him and he has some "sadness" for it to be ending since he has experienced benefits from the exercise as well as the educational components.  Mr. SHaynesworthhas been dancing more and when pacing himself he is able to finish a song now, which was one of his goals.  Counselor commended him on his progress and encouraged to keep up the good work!        Education: Education Goals: Education classes will be provided on a  weekly basis, covering required topics. Participant will state understanding/return demonstration of topics presented.  Learning Barriers/Preferences:     Learning Barriers/Preferences - 10/17/14 1130    Learning Barriers/Preferences   Learning Barriers None   Learning Preferences Group Instruction;Individual Instruction;Pictoral;Skilled Demonstration;Verbal Instruction;Video;Written Material      Education Topics: Initial Evaluation Education: - Verbal, written and demonstration of respiratory meds, RPE/PD scales, oximetry and breathing techniques. Instruction on use of nebulizers and MDIs: cleaning and proper use, rinsing mouth with steroid doses and importance of monitoring MDI activations.          Pulmonary Rehab from 02/05/2015 in [facility] Cardiac and  Pulmonary Rehab   Date  10/17/14   Educator  LB    Instruction Review Code  2- meets goals/outcomes      General Nutrition Guidelines/Fats and Fiber: -Group instruction provided by verbal, written material, models and posters to present the general guidelines for heart healthy nutrition. Gives an explanation and review of dietary fats and fiber.      Pulmonary Rehab from 01/31/2015 in [facility] Cardiac and Pulmonary Rehab   Date  12/11/14   Educator  CR   Instruction Review Code  2- meets goals/outcomes      Controlling Sodium/Reading Food Labels: -Group verbal and written material supporting the discussion of sodium use in heart healthy nutrition. Review and explanation with models, verbal and written materials for utilization of the food label.      Pulmonary Rehab from 02/05/2015 in [facility] Cardiac and Pulmonary Rehab   Date  11/17/14   Educator  Candiss Norse    Instruction Review Code  2- meets goals/outcomes        Aerobic Exercise & Resistance Training: - Gives group verbal and written discussion on the health impact of inactivity. On the components of aerobic and resistive training programs and the benefits of this training and how to safely progress through these programs.      Pulmonary Rehab from 02/05/2015 in [facility] Cardiac and Pulmonary Rehab   Date  01/31/15   Educator  SW   Instruction Review Code  2- meets goals/outcomes      Flexibility, Balance, General Exercise Guidelines: - Provides group verbal and written instruction on the benefits of flexibility and balance training programs. Provides general exercise guidelines with specific guidelines to those with heart or lung disease. Demonstration and skill practice provided.      Pulmonary Rehab from 02/05/2015 in [facility] Cardiac and Pulmonary Rehab   Date  11/15/14   Educator  SW   Instruction Review Code  2- meets goals/outcomes      Stress Management: - Provides group verbal and written instruction about the health  risks of elevated stress, cause of high stress, and healthy ways to reduce stress.      Pulmonary Rehab from 02/05/2015 in [facility] Cardiac and Pulmonary Rehab   Date  11/08/14   Educator  [facility]   Instruction Review Code  2- meets goals/outcomes      Depression: - Provides group verbal and written instruction on the correlation between heart/lung disease and depressed mood, treatment options, and the stigmas associated with seeking treatment.      Pulmonary Rehab from 02/05/2015 in [facility] Cardiac and Pulmonary Rehab   Date  01/17/15 Marcelina Morel attended 01/03/15]   Educator  [facility]   Instruction Review Code  2- meets goals/outcomes      Exercise & Equipment Safety: - Individual verbal instruction and demonstration of equipment use and safety with use of the equipment.  Pulmonary Rehab from 02/05/2015 in [facility] Cardiac and Pulmonary Rehab   Date  10/20/14   Educator  [place]   Instruction Review Code  2- meets goals/outcomes      Infection Prevention: - Provides verbal and written material to individual with discussion of infection control including proper hand washing and proper equipment cleaning during exercise session.      Pulmonary Rehab from 02/05/2015 in [facility] Cardiac and Pulmonary Rehab   Date  10/20/14   Educator  [place]   Instruction Review Code  2- meets goals/outcomes      Falls Prevention: - Provides verbal and written material to individual with discussion of falls prevention and safety.      Pulmonary Rehab from 02/05/2015 in [facility] Cardiac and Pulmonary Rehab   Date  10/17/14   Educator  LB   Instruction Review Code  2- meets goals/outcomes      Diabetes: - Individual verbal and written instruction to review signs/symptoms of diabetes, desired ranges of glucose level fasting, after meals and with exercise. Advice that pre and post exercise glucose checks will be done for 3 sessions at entry of program.   Chronic Lung Diseases: - Group verbal and written instruction to review new  updates, new respiratory medications, new advancements in procedures and treatments. Provide informative websites and "800" numbers of self-education.      Pulmonary Rehab from 02/05/2015 in [facility] Cardiac and Pulmonary Rehab   Date  12/15/14 [facility]]   Educator  CE   Instruction Review Code  2- meets goals/outcomes      Lung Procedures: - Group verbal and written instruction to describe testing methods done to diagnose lung disease. Review the outcome of test results. Describe the treatment choices: Pulmonary Function Tests, ABGs and oximetry.      Pulmonary Rehab from 02/05/2015 in [facility] Cardiac and Pulmonary Rehab   Date  11/10/14   Educator  [place]   Instruction Review Code  2- meets goals/outcomes      Energy Conservation: - Provide group verbal and written instruction for methods to conserve energy, plan and organize activities. Instruct on pacing techniques, use of adaptive equipment and posture/positioning to relieve shortness of breath.      Pulmonary Rehab from 02/05/2015 in [facility] Cardiac and Pulmonary Rehab   Date  11/22/14   Educator  SW   Instruction Review Code  2- meets goals/outcomes      Triggers: - Group verbal and written instruction to review types of environmental controls: home humidity, furnaces, filters, dust mite/pet prevention, HEPA vacuums. To discuss weather changes, air quality and the benefits of nasal washing.      Pulmonary Rehab from 02/05/2015 in [facility] Cardiac and Pulmonary Rehab   Date  01/22/15   Educator  LB   Instruction Review Code  2- meets goals/outcomes      Exacerbations: - Group verbal and written instruction to provide: warning signs, infection symptoms, calling MD promptly, preventive modes, and value of vaccinations. Review: effective airway clearance, coughing and/or vibration techniques. Create an Sports administrator.      Pulmonary Rehab from 02/05/2015 in [facility] Cardiac and Pulmonary Rehab   Date  12/18/14   Educator  LB   Instruction  Review Code  2- meets goals/outcomes        Respiratory Medications: - Group verbal and written instruction to review medications for lung disease. Drug class, frequency, complications, importance of spacers, rinsing mouth after steroid MDI's, and proper cleaning methods for nebulizers.  Pulmonary Rehab from 02/05/2015 in [facility] Cardiac and Pulmonary Rehab   Date  10/17/14   Educator  LB   Instruction Review Code  2- meets goals/outcomes      A  Breathing Retraining: - Provides individuals verbal and written instruction on purpose, frequency, and proper technique of diaphragmatic breathing and pursed-lipped breathing. Applies individual practice skills.      Pulmonary Rehab from 02/05/2015 in [facility] Cardiac and Pulmonary Rehab   Date  10/20/14   Educator  [place]   Instruction Review Code  2- meets goals/outcomes        Heart Failure: - Group verbal and written instruction on the basics of heart failure: signs/symptoms, treatments, explanation of ejection fraction, enlarged heart and cardiomyopathy.      Pulmonary Rehab from 02/05/2015 in [facility] Cardiac and Pulmonary Rehab   Date  01/26/15   Educator  [place]   Instruction Review Code  2- meets goals/outcomes      S  Relaxation: - Provides group, verbal and written instruction about the benefits of relaxation for patients with heart/lung disease. Also provides patients with examples of relaxation techniques.      Pulmonary Rehab from 02/05/2015 in [facility] Cardiac and Pulmonary Rehab   Date  12/06/14   Educator  [facility]   Instruction Review Code  2- Meets goals/outcomes      Knowledge Questionnaire Score:     Knowledge Questionnaire Score - 01/29/15 1000    Knowledge Questionnaire Score   Post Score -2      Personal Goals and Risk Factors at Admission:     Personal Goals and Risk Factors at Admission - 11/03/14 1058    Core Components/Risk Factors/Patient Goals on Admission   Lipids Yes  On meds for cholesterol control.   Goal  Cholesterol controlled with medications as prescribed, with individualized exercise RX and with personalized nutrition plan. Value goals: LDL < 83m, HDL > 459m Participant states understanding of desired cholesterol values and following prescriptions.   Intervention (read-only) Provide nutrition & aerobic exercise along with prescribed medications to achieve LDL <7074mHDL >67m39m    Personal Goals and Risk Factors Review:      Goals and Risk Factor Review      10/27/14 1417 10/30/14 1000 11/03/14 1054 11/03/14 1059 11/03/14 1111   Core Components/Risk Factors/Patient Goals Review   Personal Goals Review Quit Smoking Increase Aerobic Exercise and Physical Activity;Develop more efficient breathing techniques such as purse lipped breathing and diaphragmatic breathing and practicing self-pacing with activity. Weight Management;Lipids  Develop more efficient breathing techniques such as purse lipped breathing and diaphragmatic breathing and practicing self-pacing with activity.   Weight Management (read-only)   Goals Progress/Improvement seen   No     Comments   No weight change noted yet  5 sessions attended. To make appointment with RD. HAs cut down on milk consumption,and is working on prtion control. Still need uidance on calories/fat/portion control. Has started working on changing cereals from "sugar Crack" to whole grain cereals. HAs decreased sweets intake too.     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      Quit Smoking (read-only)   Goals Progress/Improvement seen Yes Yes      Comments Mr.  Virden has not had a cigarette in 1 week. Mr Lennox admits he smokes occasionally and with stress. His trip was for legal matters and was very  stressful, but he did not smoke there and states he has not had a cigarrette since his start date in [place], 10/17/2014.      Breathing Techniques (read-only)   Goals Progress/Improvement seen   Yes   --  Xai had breathing tests done this past Wednesday at the referring MD office. He was told his numbers are improved.   Comments  Mr Tuazon is using PLB with his exercise goals and activites at home and states that it is helpful for shortness of breath.      Hypertension (read-only)   Goal   --  Ronn does not have HTN listed as a risk factor.     Abnormal Lipids (read-only)   Progress seen towards goals    Unknown    Comments    On meds for cholesterol control. Is working on eating habits,waiting to see the RD for advice.      11/06/14 1501 11/08/14 1000 11/17/14 1240 11/17/14 1454 11/20/14 1000   Core Components/Risk Factors/Patient Goals Review   Personal Goals Review Understand more about Heart/Pulmonary Disease Improve shortness of breath with ADL's;Increase knowledge of respiratory medications and ability to use respiratory devices properly. Hypertension     Weight Management (read-only)   Goals Progress/Improvement seen   No     Comments   No weight changes since last spoke with Eddie Dibbles. He has made  a few nutriotin changes. Added yogurt to his meals, continues to decrease milk intake. Added iced tea, then decided the sugar in the tea might not be a great change. Antonius finds that he grazes after dinner and ends up snacking all evening while watching TV. HE has committed to spending 2 nights a week working on a project in his shop to curb his Grazing" after dinner.  He has set a goal to lose 2 pounds in the next two weeks.     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 This is when it is difficult to not smoke when around other people are smoking.      Understand more about  Heart/Pulmonary Disease (read-only)   Goals Progress/Improvement seen  Yes    Yes   Comments Educated Mr Cogdell on his PFT and related it to his COPD. Educated him on the goal to maintain his Spirometry results, prevent them from worsening, as well as his COPD, by exercising, smoking cessation, using his MDI's, good diet, and wearing oxygen if needed.    In the past, Mr Murty modified his activity to adjust for his shortness of breath and COPD. He loves to [place] dance and made a song that was only 36mn long. Last Friday, he was able to shang dance with his wife for 3-4 mins which was a big accomplishment and very important to him.   Improve shortness of breath with ADL's (read-only)   Goals Progress/Improvement seen   Yes  Yes    Comments  Mr SDegeorgewas having a "bad breathing day". He had mowed the other day and inhaled dusty air. We talked about hime wearing a mask with yardwork, nasal washing after the work, and using his inhaler for rescue.  Mr. SWesbysays he is not using as  much Proventil since he has been in rehab.  He is not running out before the month's end.  He is also doing more. He is helping his son remodel an old home.    Breathing Techniques (read-only)   Goals Progress/Improvement seen     Yes    Comments    Mr. Trostel is using proper technique for PLB while on the TM.    Increase knowledge of respiratory medications (read-only)   Goals Progress/Improvement seen   Yes  Yes    Comments  Mr Corsino has a good understanding of his MDI's. We discussed using his Albuterol MDI before exercise in [facility] and the importance of Spiriva with exercise.  Mr. Iles is taking Spiriva, Symbicort and Proventil MDIs.  He is rinsing his mouth after taking Spiriva and Symbicort.    Hypertension (read-only)   Goal   Participant will see blood pressure controlled within the values of 140/50m/Hg or within value directed by their physician.     Progress seen toward goals   Yes     Comments   PDuvallstates he is  not on any medications presently for BP control. His documented BP readings are in normal range.      Abnormal Lipids (read-only)   Progress seen towards goals   Unknown     Comments   HAs an appt with the RD next week. PAugustencontinues to work on cGames developerhabits to better choices. Added yogurt to his meals since last time I spoke with him.        11/27/14 1111 11/29/14 1538 12/04/14 1051 12/04/14 1611 12/06/14 1102   Weight Management (read-only)   Goals Progress/Improvement seen  Yes      Comments  Keldon has added some project time to his evenings. He helped on a project that exposed him to "asbestos" dust. He has been sick since this exposure and he was advised to call his MD if he continues to be sick. PLonniehas removed all the junk food from his house since we last spoke and has added more fruit and vegetables to his daily meals. He is down over 3 pounds since making the changes.       Increase Aerobic Exercise and Physical Activity (read-only)   Goals Progress/Improvement seen  Yes  Yes  Yes   Comments PRaylenused all his inhalers the other day so SFoster Simpson RN instructed him to call his MD. Today he reported he has limited his cigarette smoking except Thursday he smoked 14 cigarettes. He has also been around [employer]bestors.   Patient reported that walking from his car to the pulmonary rehab gym is now much easier than it was on the first day he came to rehab. He stated that his exercise prescription is challenging, but on a good breathing day he does increase his workload slightly to continually challenge himself.   PJakoreysaid that the treadmill is the hardest machine for him, but he has noticed his hard work on the treadmill paying off as he can walk easier and faster without illiciting symptoms. He is very encouraged by this progress and attributes it to his consistent attendance in [facility]   Quit Smoking (read-only)   Goals Progress/Improvement seen    Yes    Comments    Mr SWhytehas not  smoked since 11/27/2014, even over the Holiday's. He is very determined not to smoke and can feel a difference with his breathing since he has quit.    Improve shortness  of breath with ADL's (read-only)   Goals Progress/Improvement seen  No       Comments Edvin used all his inhalers the other day so [name], RN instructed him to call his MD. Today he reported he has limited his cigarette smoking except Thursday he smoked 14 cigarettes. He has also been around [employer]bestors.          12/13/14 1000 12/13/14 1055 12/15/14 1421 12/18/14 1516 01/02/15 0719   Weight Management (read-only)   Comments     Mr Romulus has made some dietitary changes for better nutritional habits. He is maintaining his weight at 236 to 239 in [place].   Increase Aerobic Exercise and Physical Activity (read-only)   Goals Progress/Improvement seen  Yes Yes      Comments Ms Johnson started an interval training session on the [place]. He has the goal to dance shag for a 3 minute song. HIs levels on the RB are 6-9 and his rpm 60-75. Discussed interval training with Eddie Dibbles. He has the goal of being able to complete a 3 minute high intensity shag dance. He will do 2-3 minute intervals on the NS and RB. He will increase in level and speed and then drop back to a moderate base intensity for 3-5 minutes.       Quit Smoking (read-only)   Goals Progress/Improvement seen No   Yes    Comments Mr Kessen has smoked 2 cigarrettes so far this week.   Mr Cannata only had 2 cigarettes this weekend. He had been with a friend and had the cigarettes. We talked about using a nicotine replacement which he does have the gum, but he wants to quit without  any nicotine substitute.    Understand more about Heart/Pulmonary Disease (read-only)   Goals Progress/Improvement seen      Yes   Comments     Mr Neis has acquired increased knowledge of COPD which has helped him manage his daily life with COPD, especially increased exercise and proper use of his inhalers.   Improve  shortness of breath with ADL's (read-only)   Goals Progress/Improvement seen    Yes     Comments   Eithan is still having to use his Albuterol more than directed and is running out before the end of the month.  Instructed him to call MD and notify him of this.  He is able to walk further distances and dance a little since beginning [facility] and is walking on the TM with an incline.     Breathing Techniques (read-only)   Goals Progress/Improvement seen    Yes     Comments   Mr. Gusler is using PLB when on the equipment in [facility], espically on the TM.     Increase knowledge of respiratory medications (read-only)   Goals Progress/Improvement seen    Yes     Comments   Mr. Overfelt is no longer taking Combivent.  He does have a spacer and using it with his proventil and symbicort.     Hypertension (read-only)   Progress seen toward goals     Yes   Comments     Acceptable BP readings in [facility].   Abnormal Lipids (read-only)   Progress seen towards goals     Yes   Comments     Mr Greenley is compliant with his provastatin and has made some nutritional changes.     01/03/15 1502 01/03/15 1509 01/17/15 1000 01/19/15 1422 01/19/15 1539   Core Components/Risk Factors/Patient Goals Review  Personal Goals Review     Weight Management;Hypertension   Weight Management (read-only)   Goals Progress/Improvement seen     Yes   Comments     Srinivas continues to maintain at 236 pounds. He said the holidays and the being snowed in has hurt his eating habits.  He continues to work on decreasing his sugar intake.  "I do not add sugar to anything."He is grazing less after dinner.    Increase Aerobic Exercise and Physical Activity (read-only)   Goals Progress/Improvement seen  Yes       Comments Discussed with Mr Justen about the [employer] and our Scholarship program. He is very interested and does like the [employer].       Quit Smoking (read-only)   Goals Progress/Improvement seen No  No Yes    Comments Mr Mestas had  a bad day yesterday. 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 I gave him information on the VOKE nicotine replacement  inhaler which could be a substite for a cigarrettte in situations like this. He is planning on research it. The Voke was to be approved by the FDA this year.  Mr Gladden did smoke one cigarrette this morning. 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  Mr. Arko did not smoke at all yesterday.  He has had one cigarette today.    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     Breathing Techniques (read-only)   Goals Progress/Improvement seen     Yes    Comments    Mr. Bilodeau is showing proper technique with PLB while on the TM.    Increase knowledge of respiratory medications (read-only)   Goals Progress/Improvement seen   Yes      Comments  Discussed with Mr Samuelson about buying an oximeter which would be helpful to monitor his O2Sat's with activites at home.      Hypertension (read-only)   Goal     Participant will see blood pressure controlled within the values of 140/40m/Hg or within value directed by their physician.   Progress seen toward goals     Yes   Comments     continues with normal BP readings.      01/22/15 1008 01/22/15 1546 01/24/15 1511 01/29/15 1458 01/31/15 1446   Quit Smoking (read-only)   Goals Progress/Improvement seen  Yes No Yes Yes   Comments  Mr SHaapalahas not had a cigarrette since Friday! 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 I talked again about nicotine replacement, but Mr SLahaiedoes not want to use the repalcement. He has to get his cessation in his "mind"  and not smoke. No cigarrettes  since Thursday! Still no cigarrettes since last Thursday; Mr shorseystates he feels better with his exercise without the cigarrettes.   Improve shortness of breath with ADL's (read-only)   Goals Progress/Improvement seen  No       Comments SOB has not improved, however, it is maintaining where its at currently.  Hopes continuing exercise will improve this as time goes on.         02/02/15 1051  02/05/15 1000 02/07/15 1000 02/07/15 1448     Increase Aerobic Exercise and Physical Activity (read-only)   Goals Progress/Improvement seen    Yes     Comments   Mr Beezley states he has increased his stamina with activities. He has a better understanding of his limits due to his COPD and continues to do some of his activities with pacing. He has modified his shag dancing and moves his wife more rather himself. He has modified his work at his [employer] site and knows the harmfulness of the environment on his lungs. He is committed to exercise at [employer] after [facility].                   tha   imsself and he Mr Bolotin states he has increased his stamina with activities. He has a better understanding of his limits due to his COPD and continues to do some of his activities with pacing. He has modified his shag dancing and moves his wife more rather himself. He has modified his work at his [employer] site and knows the harmfulness of the environment on his lungs. He is committed to exercise at [employer] after [facility].                       Quit Smoking (read-only)   Goals Progress/Improvement seen Yes Yes      Comments Still has not had any cigarrettes since last Thursday and is feeling good about staying strong for so many days in a row.  Ms Stetzer has still not had a cigarrette since 2 Thursdays ago.  Mr Frayre had a few cigarrettes at the job site - this will be his biggest challenge with smoking cessation, and he does not want to use any nicotine replacement.     Improve shortness of breath with  ADL's (read-only)   Goals Progress/Improvement seen   Yes      Comments  mr Womac has improved his shortness of breath throughout his participation in [place]. He still has good and bad days which he understands as a part of COPD and paces himself with activites, including his shag dancing.      Breathing Techniques (read-only)   Goals Progress/Improvement seen   Yes      Comments  Mr Breit has good technique and knows the importance of using PLB.      Increase knowledge of respiratory medications (read-only)   Goals Progress/Improvement seen   Yes      Comments  Mr Knight has a good understanding of his inhalers and his spacer.       Hypertension (read-only)   Comments  Mr Brockel has maintained acceptable BP reading in [facility] and is compliant with his medication.      Abnormal Lipids (read-only)   Comments  Mr Leicht is compliant with his provastatin and has made some nutritional changes.         Personal Goals Discharge (Final Personal Goals and Risk Factors Review):      Goals and Risk Factor Review - 02/07/15 1448    Increase Aerobic Exercise and Physical Activity (read-only)   Comments Mr Waltman states he has increased his stamina with activities. He has a better understanding of his limits due to his COPD and continues to do some of his activities with pacing. He has modified his shag dancing and moves his wife more rather himself. He has modified his work at his [employer] site  and knows the harmfulness of the environment on his lungs. He is committed to exercise at [employer] after [facility].                         ITP Comments:     ITP Comments      12/13/14 1000 12/15/14 1427 12/18/14 1029 12/29/14 1401 01/03/15 1116   ITP Comments Estimated timetable to achieve ITP goals is 16 more sessions. Mr. Willetts had blood work and an x-ray yesterday.  He will be out on Wednesday for an oncologist visit at [facility]. ITP goals anticipated to be met in 16 more sessions.  ITP  goals anticipated to be met in 13 more sessions. Personal and exercise goals expected to be met in12 more sessions. Progress on specific individualized goals will be charted in patient's ITP. Upon completion of the program the patient will be comfortable managing exercise goals and progression on their own     01/17/15 1056 01/19/15 1121 01/19/15 1422 01/22/15 1050 01/24/15 1012   ITP Comments Personal and exercise goals expected to be met in 11 more sessions. Progress on specific individualized goals will be charted in patient's ITP. Upon completion of the program the patient will be comfortable managing exercise goals and progression on their own.  Personal and exercise goals expected to be met in 26 more sessions. Progress on specific individualized goals will be charted in patient's ITP. Upon completion of the program the patient will be comfortable managing exercise goals and progression on their own.  Personal and exercise goals expected to be met in 10 more sessions. Progress on specific individualized goals will be charted in patient's ITP. Upon completion of the program the patient will be comfortable managing exercise goals and progression on their own.  Personal and exercise goals anticipated to be met in 9 more sessions.  Personal and exercise goals expected to be met in more 7 sessions. Progress on specific individualized goals will be charted in patient's ITP. Upon completion of the program the patient will be comfortable managing exercise goals and progression on their own.      01/26/15 1058 01/31/15 1054 02/02/15 1051 02/05/15 1056 02/07/15 1113   ITP Comments Personal and exercise goals expected to be met in 1  more sessions. Progress on specific individualized goals will be charted in patient's ITP. Upon completion of the program the patient will be comfortable managing exercise goals and progression on their own.  Personal and exercise goals will be completed within 7 more sessions.  Specific  goal progression will be noted in ITP. Personal and exercise goals expected to be met in 4 more sessions. Progress on specific individualized goals will be charted in patient's ITP. Upon completion of the program the patient will be comfortable managing exercise goals and progression on their own.  Personal and exercise goals are expected to be met in 3 more sessions. See ITP for specific goal progress.  Personal and exercise goals expected to be met in 2 more sessions. Progress on specific individualized goals will be charted in patient's ITP. Upon completion of the program the patient will be comfortable managing exercise goals and progression on their own.      03/13/15 1053           ITP Comments Called Mr Strahle - second call with no response; plan to discharge him at 34 sessions; he planned to continue exercise at the [facility].  Comments: Mr Roggenkamp is discharge at 34 sessions. He planned to continue his exercise at the [place] gym through his membership. Thank you for the opportunity to work with your patient in [place].

## 2015-12-04 IMAGING — CR DG CHEST 2V
1 series · 2 of 2 positions shown · non-contrast
Comparison: None.

CLINICAL DATA: 58-year-old male with 4 week history of productive
cough, shortness of breath, wheezing and fatigue

EXAM:
CHEST  2 VIEW

[Series 1: dxr chest pa (or ap) and lateral · 0.14mm/px · 2 of 2 slices shown]
[im 1/2]
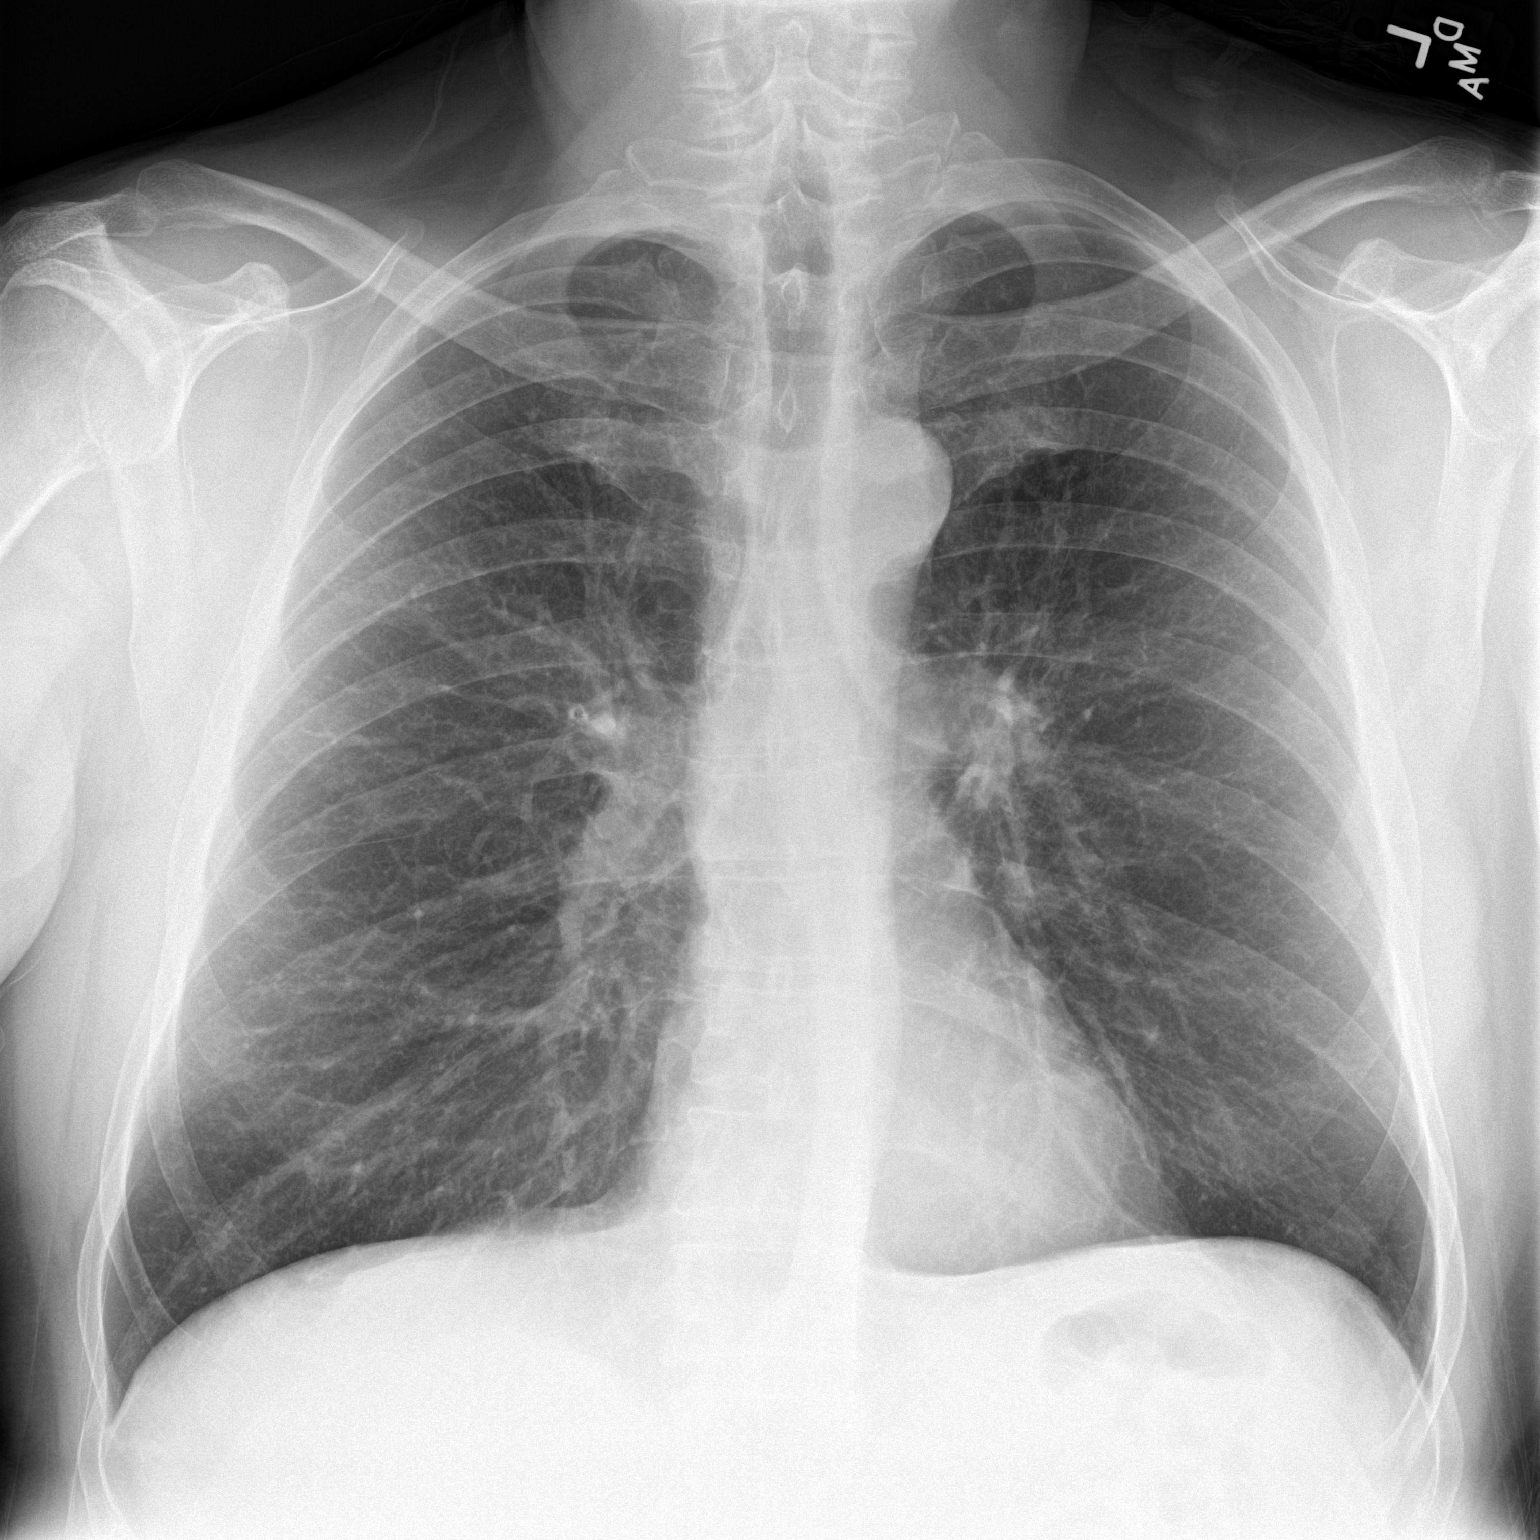
[im 2/2]
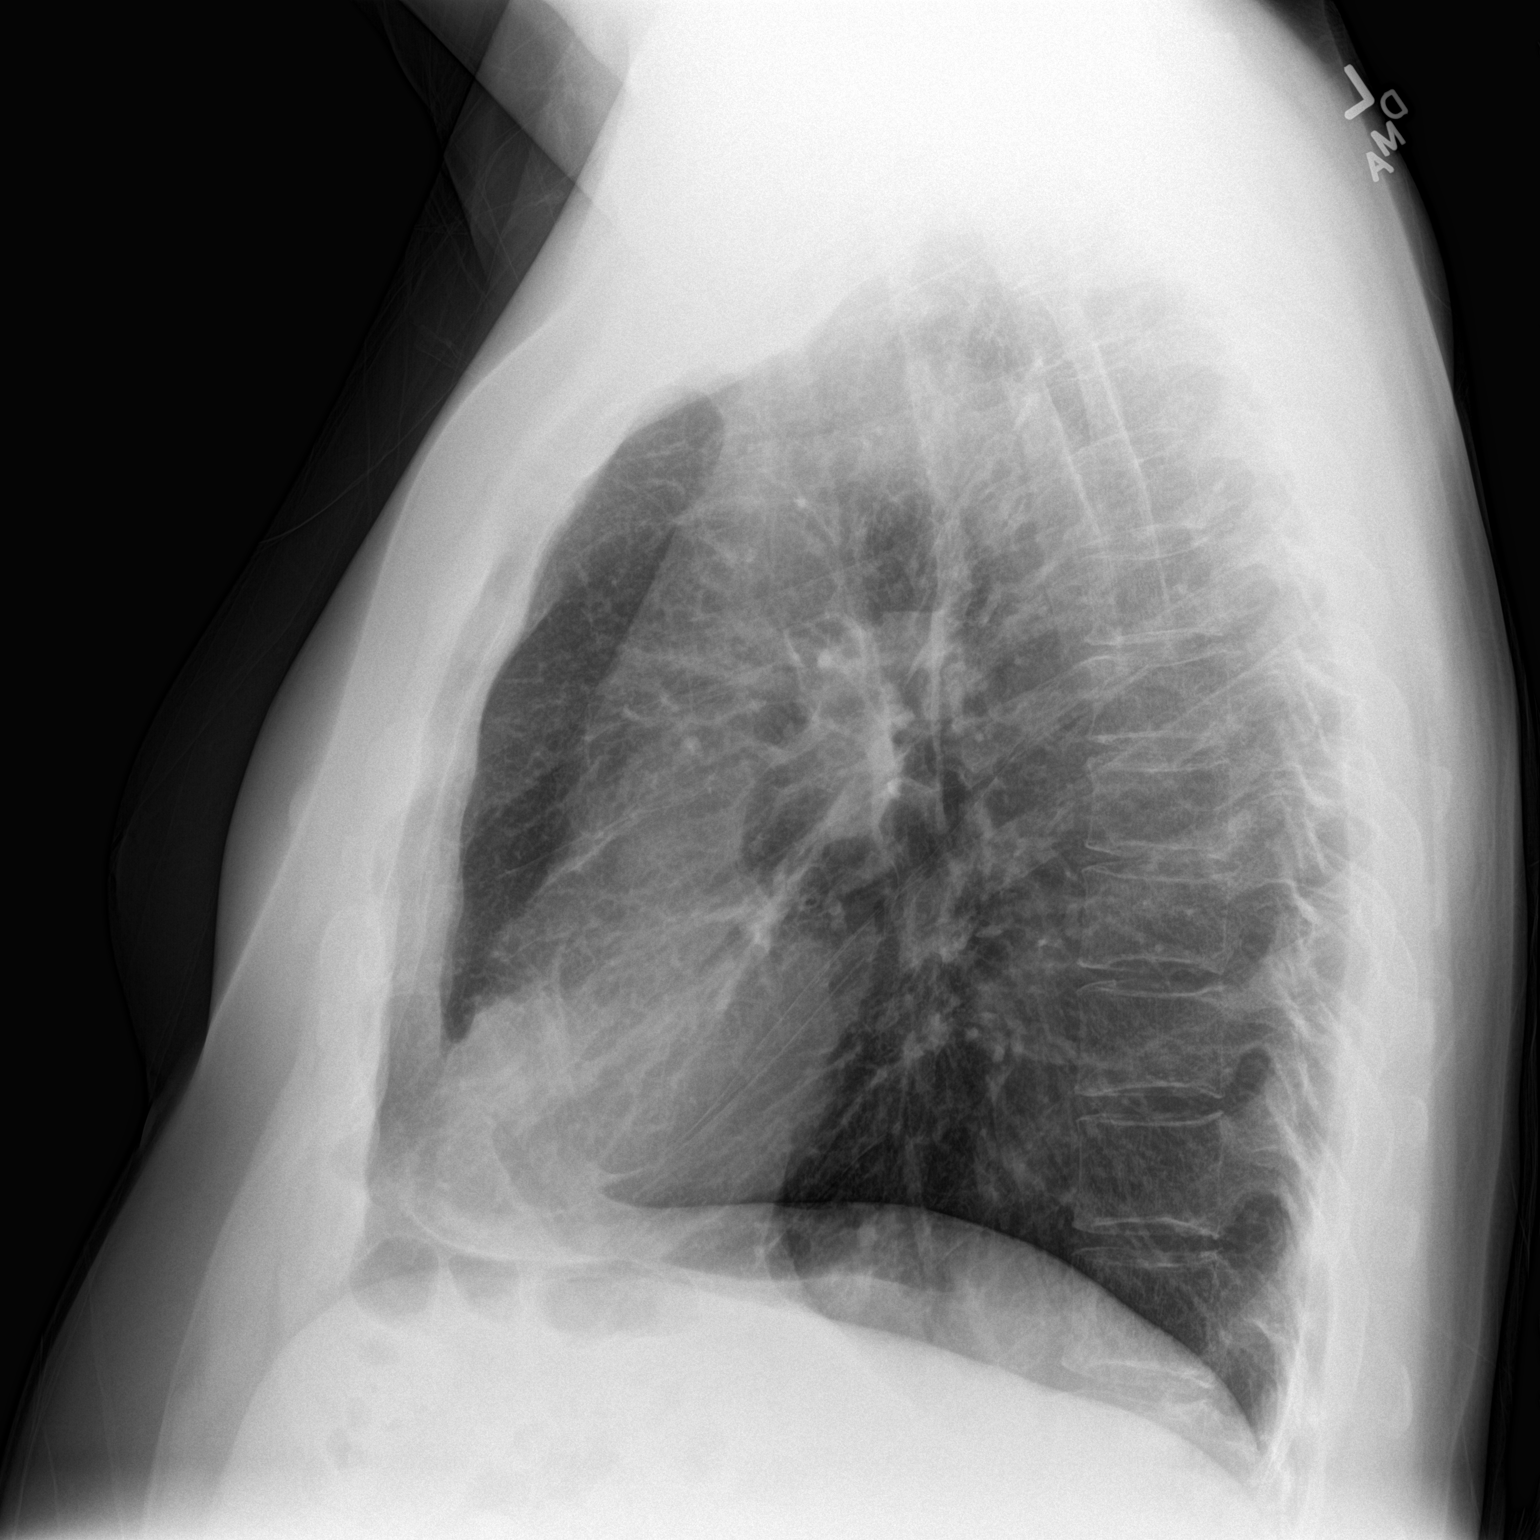

[2 of 2 positions shown; findings below may reference images not displayed]

FINDINGS: Cardiac and mediastinal contours are within normal limits. No
evidence of focal airspace consolidation, pleural effusion or
pneumothorax. No pulmonary edema. No suspicious pulmonary mass or
nodule. The lungs are slightly hyperinflated and there are mild
central bronchitic changes. No acute osseous abnormality.
IMPRESSION: 1. No acute cardiopulmonary process.
2. Mild pulmonary hyperinflation and central bronchitic changes.
Differential considerations include chronic bronchitis/COPD or
longstanding asthma.

## 2016-02-20 ENCOUNTER — Encounter: Payer: Self-pay | Admitting: Urology

## 2016-02-20 ENCOUNTER — Ambulatory Visit (INDEPENDENT_AMBULATORY_CARE_PROVIDER_SITE_OTHER): Payer: Medicaid Other | Admitting: Urology

## 2016-02-20 VITALS — BP 124/79 | HR 76 | Ht 72.0 in | Wt 239.9 lb

## 2016-02-20 DIAGNOSIS — N401 Enlarged prostate with lower urinary tract symptoms: Secondary | ICD-10-CM

## 2016-02-20 DIAGNOSIS — N529 Male erectile dysfunction, unspecified: Secondary | ICD-10-CM

## 2016-02-20 DIAGNOSIS — R972 Elevated prostate specific antigen [PSA]: Secondary | ICD-10-CM | POA: Diagnosis not present

## 2016-02-20 DIAGNOSIS — N138 Other obstructive and reflux uropathy: Secondary | ICD-10-CM | POA: Diagnosis not present

## 2016-02-20 NOTE — Progress Notes (Signed)
02/20/2016 2:27 PM   Nathaniel [name] [birth year] [id number]  Referring provider: Ellamae Sia, MD [address] [place], [place] [address]  Chief Complaint  Patient presents with  . New Patient (Initial Visit)    elevated psa referred by Dr. Ellin Goodie    HPI: 61 yo WM who is referred by Dr. Quay Burow for an elevated PSA.  Patient was found to have a PSA of 4.6 ng/mL on 02/07/2016 and a repeated PSA of 5, 2 weeks later.    His IPSS score today is 14, which is moderate lower urinary tract symptomatology. He is pleased with his quality life due to his urinary symptoms.      His major complaints today are frequency, urgency and nocturia x 1  He has had these symptoms for two years.  He denies any dysuria, hematuria or suprapubic pain.   He also denies any recent fevers, chills, nausea or vomiting.  He does not have a family history of PCa.      IPSS    Row Name 02/20/16 1400         International Prostate Symptom Score   How often have you had the sensation of not emptying your bladder? Not at All     How often have you had to urinate less than every two hours? Almost always     How often have you found you stopped and started again several times when you urinated? Not at All     How often have you found it difficult to postpone urination? More than half the time     How often have you had a weak urinary stream? Not at All     How often have you had to strain to start urination? Not at All     How many times did you typically get up at night to urinate? 5 Times     Total IPSS Score 14       Quality of Life due to urinary symptoms   If you were to spend the rest of your life with your urinary condition just the way it is now how would you feel about that? Pleased        Score:  1-7 Mild 8-19 Moderate 20-35 Severe  His SHIM score is 16, which is mild to moderate ED.   He has been having difficulty with erections for few years.   His major complaint is  maintaining an erections.  His libido is preserved.   His risk factors for ED are age, BPH, and blood pressure medications.   He denies any painful erections or curvatures with his erections.   He has tried Viagra in the past.  It worked, but it gave him a headache.      SHIM    Row Name 02/20/16 1407         SHIM: Over the last 6 months:   How do you rate your confidence that you could get and keep an erection? Low     When you had erections with sexual stimulation, how often were your erections hard enough for penetration (entering your partner)? Most Times (much more than half the time)     During sexual intercourse, how often were you able to maintain your erection after you had penetrated (entered) your partner? Difficult     During sexual intercourse, how difficult was it to maintain your erection to completion of intercourse? Slightly Difficult     When you attempted  sexual intercourse, how often was it satisfactory for you? Difficult       SHIM Total Score   SHIM 16        Score: 1-7 Severe ED 8-11 Moderate ED 12-16 Mild-Moderate ED 17-21 Mild ED 22-25 No ED  PMH: Past Medical History:  Diagnosis Date  . Arthritis   . Chronic back pain    Dr. Quay Burow manages (per pt)  . COPD (chronic obstructive pulmonary disease) ([place])   . Hearing loss   . Hyperlipidemia   . Hypertension   . MGUS (monoclonal gammopathy of unknown significance)   . Multiple myeloma ([place])   . Proteinuria   . Tobacco abuse     Surgical History: Past Surgical History:  Procedure Laterality Date  . COLONOSCOPY WITH PROPOFOL N/A 07/04/2014   Procedure: COLONOSCOPY WITH PROPOFOL;  Surgeon: Lucilla Lame, MD;  Location: [facility] ENDOSCOPY;  Service: Endoscopy;  Laterality: N/A;  . TONSILLECTOMY     as child    Home Medications:  Allergies as of 02/20/2016      Reactions   Albuterol Other (See Comments)   Muscle cramps      Medication List       Accurate as of 02/20/16  2:27 PM. Always use your most  recent med list.          albuterol 108 (90 Base) MCG/ACT inhaler Commonly known as:  PROVENTIL HFA;VENTOLIN HFA Inhale 2 puffs into the lungs every 6 (six) hours as needed for wheezing or shortness of breath.   albuterol 108 (90 Base) MCG/ACT inhaler Commonly known as:  PROVENTIL HFA;VENTOLIN HFA Inhale into the lungs.   aspirin EC 81 MG tablet Take 81 mg by mouth.   enalapril 10 MG tablet Commonly known as:  VASOTEC Take 1 tablet by mouth at bedtime.   fluticasone 50 MCG/ACT nasal spray Commonly known as:  FLONASE 1 spray by Each Nare route two (2) times a day as needed. Frequency:BID   Dosage:50   MCG  Instructions:  Note:Dose: 50MCG   hydrocortisone cream 1 % Apply 1 application topically 2 (two) times daily as needed.   levalbuterol 45 MCG/ACT inhaler Commonly known as:  XOPENEX HFA Inhale into the lungs.   meloxicam 7.5 MG tablet Commonly known as:  MOBIC Take 15 mg by mouth.   Na Sulfate-K Sulfate-Mg Sulf 17.5-3.13-1.6 GM/180ML Soln Commonly known as:  SUPREP BOWEL PREP KIT Take 1 kit by mouth as directed.   oxyCODONE-acetaminophen 5-325 MG tablet Commonly known as:  PERCOCET/ROXICET Take 1 tablet by mouth every 6 (six) hours as needed.   pravastatin 10 MG tablet Commonly known as:  PRAVACHOL Take 10 mg by mouth daily.   sildenafil 100 MG tablet Commonly known as:  VIAGRA Take 1 tablet by mouth as needed.   SPIRIVA HANDIHALER 18 MCG inhalation capsule Generic drug:  tiotropium Place into inhaler and inhale.   SYMBICORT 160-4.5 MCG/ACT inhaler Generic drug:  budesonide-formoterol Inhale into the lungs.       Allergies:  Allergies  Allergen Reactions  . Albuterol Other (See Comments)    Muscle cramps    Family History: Family History  Problem Relation Age of Onset  . Heart disease Brother 14    MI  . Colon cancer Neg Hx   . Liver disease Neg Hx   . Prostate cancer Neg Hx   . Kidney cancer Neg Hx   . Bladder Cancer Neg Hx     Social  History:  reports that he has been smoking  Cigarettes.  He has a 90.00 pack-year smoking history. He has never used smokeless tobacco. He reports that he does not drink alcohol or use drugs.  ROS: UROLOGY Frequent Urination?: Yes Hard to postpone urination?: Yes Burning/pain with urination?: No Get up at night to urinate?: Yes Leakage of urine?: No Urine stream starts and stops?: No Trouble starting stream?: No Do you have to strain to urinate?: No Blood in urine?: No Urinary tract infection?: No Sexually transmitted disease?: No Injury to kidneys or bladder?: No Painful intercourse?: No Weak stream?: No Erection problems?: Yes Penile pain?: No  Gastrointestinal Nausea?: No Vomiting?: No Indigestion/heartburn?: Yes Diarrhea?: No Constipation?: No  Constitutional Fever: No Night sweats?: No Weight loss?: No Fatigue?: Yes  Skin Skin rash/lesions?: Yes Itching?: Yes  Eyes Blurred vision?: No Double vision?: No  Ears/Nose/Throat Sore throat?: No Sinus problems?: Yes  Hematologic/Lymphatic Swollen glands?: No Easy bruising?: No  Cardiovascular Leg swelling?: No Chest pain?: No  Respiratory Cough?: Yes Shortness of breath?: Yes  Endocrine Excessive thirst?: No  Musculoskeletal Back pain?: Yes Joint pain?: Yes  Neurological Headaches?: Yes Dizziness?: Yes  Psychologic Depression?: No Anxiety?: No  Physical Exam: BP 124/79   Pulse 76   Ht 6' (1.829 m)   Wt 239 lb 14.4 oz (108.8 kg)   BMI 32.54 kg/m   Constitutional: Well nourished. Alert and oriented, No acute distress. HEENT: [place] AT, moist mucus membranes. Trachea midline, no masses. Cardiovascular: No clubbing, cyanosis, or edema. Respiratory: Normal respiratory effort, no increased work of breathing. GI: Abdomen is soft, non tender, non distended, no abdominal masses. Liver and spleen not palpable.  No hernias appreciated.  Stool sample for occult testing is not indicated.   GU: No CVA  tenderness.  No bladder fullness or masses.  Patient with circumcised phallus. Urethral meatus is patent.  No penile discharge. No penile lesions or rashes. Scrotum without lesions, cysts, rashes and/or edema.  Testicles are located scrotally bilaterally. No masses are appreciated in the testicles. Left and right epididymis are normal. Rectal: Patient with  normal sphincter tone. Anus and perineum without scarring or rashes. No rectal masses are appreciated. Prostate is approximately 60 grams, no nodules are appreciated. Seminal vesicles are normal. Skin: No rashes, bruises or suspicious lesions. Lymph: No cervical or inguinal adenopathy. Neurologic: Grossly intact, no focal deficits, moving all 4 extremities. Psychiatric: Normal mood and affect.  Laboratory Data: PSA History   4.6 ng/mL on 02/07/2016 - repeated PSA 2 weeks later at 5 per PCP's notes   Assessment & Plan:    1. Elevated PSA  - I discussed with the patient that PSA is an acronym for  prostate specific antigen,  which is a protein made by the prostate gland and can be detected in the blood stream. I explained to the patient situations that would increase the PSA, such as: a man's age,  BPH, infection, recent intercourse/ejaculation, prostate infarction, recent urethroscopic manipulation (Foley placement/cystoscopy) and prostate cancer.   - At this time, I have advised the patient that we will repeat the PSA and add a free and total PSA to the blood work  - We discussed that indications for prostate biopsy are defined by age and race specific PSA cutoffs as well as a PSA velocity of 0.75/year.  - follow up pending free and total PSA results  2. BPH with LUTS  - IPSS score is 14/1  - Continue conservative management, avoiding bladder irritants and timed voiding's  - RTC in pending free and total  PSA results  3. Erectile dysfunction  - SHIM score is 16  - Viagra was effective, but caused a headache  - I explained to the patient  that in order to achieve an erection it takes good functioning of the nervous system (parasympathetic, sympathetic, sensory and motor), good blood flow into the erectile tissue of the penis and a desire to have sex  - I explained that conditions like diabetes, hypertension, coronary artery disease, peripheral vascular disease, smoking, alcohol consumption, age, sleep apnea and BPH can diminish the ability to have an erection  - We discussed trying a different PDE5 inhibitor, intra-urethral suppositories, intracavernous vasoactive drug injection therapy, vacuum constriction device and penile prosthesis implantation - he is not interested in other therapies at this time  Return for pending free and total PSA results.  These notes generated with voice recognition software. I apologize for typographical errors.  Zara Council, [facility] [address] [place], [place] [address] [phone]

## 2016-02-22 ENCOUNTER — Telehealth: Payer: Self-pay

## 2016-02-22 DIAGNOSIS — R972 Elevated prostate specific antigen [PSA]: Secondary | ICD-10-CM

## 2016-02-22 LAB — PSA, TOTAL AND FREE
PSA, Free Pct: 16 %
PSA, Free: 0.85 ng/mL
Prostate Specific Ag, Serum: 5.3 ng/mL — ABNORMAL HIGH (ref 0.0–4.0)

## 2016-02-22 NOTE — Telephone Encounter (Signed)
LMOM

## 2016-02-22 NOTE — Telephone Encounter (Signed)
-----   Message from Nori Riis, PA-C sent at 02/22/2016  8:29 AM EST ----- Please notify patient that his free and total PSA indicate a 17% probability of having prostate cancer.  I suggest follow up in 6 months with PSA drawn prior to appointment.

## 2016-02-25 NOTE — Telephone Encounter (Signed)
Spoke with pt in reference to PSA and prostate cancer. Pt voiced understanding. Orders placed.  Pt requested both appts be made and sent to him.

## 2016-02-26 NOTE — Telephone Encounter (Signed)
done

## 2016-04-14 ENCOUNTER — Encounter: Payer: Medicaid Other | Attending: Internal Medicine | Admitting: Respiratory Therapy

## 2016-04-14 VITALS — Ht 72.4 in | Wt 238.2 lb

## 2016-04-14 DIAGNOSIS — J449 Chronic obstructive pulmonary disease, unspecified: Secondary | ICD-10-CM | POA: Diagnosis not present

## 2016-04-14 NOTE — Patient Instructions (Signed)
Patient Instructions  Patient Details  Name: Nathaniel [name] MRN: [id number] Date of Birth: [birth year] Referring Provider:  Tawny Asal, MD  Below are the personal goals you chose as well as exercise and nutrition goals. Our goal is to help you keep on track towards obtaining and maintaining your goals. We will be discussing your progress on these goals with you throughout the program.  Initial Exercise Prescription:     Initial Exercise Prescription - 04/14/16 1600      Date of Initial Exercise RX and Referring Provider   Date 04/14/16   Referring Provider Mock, Corene Cornea MD     Elliptical   Level 1   Speed 2.5   Minutes 15     T5 Nustep   Level 3   SPM 80   Minutes 15   METs 2     Track   Laps 25   Minutes 15   METs 2.15     Prescription Details   Frequency (times per week) 3   Duration Progress to 45 minutes of aerobic exercise without signs/symptoms of physical distress     Intensity   THRR 40-80% of Max Heartrate 125-150   Ratings of Perceived Exertion 11-13   Perceived Dyspnea 0-4     Progression   Progression Continue to progress workloads to maintain intensity without signs/symptoms of physical distress.     Resistance Training   Training Prescription Yes   Weight 3 lbs   Reps 10-15      Exercise Goals: Frequency: Be able to perform aerobic exercise three times per week working toward 3-5 days per week.  Intensity: Work with a perceived exertion of 11 (fairly light) - 15 (hard) as tolerated. Follow your new exercise prescription and watch for changes in prescription as you progress with the program. Changes will be reviewed with you when they are made.  Duration: You should be able to do 30 minutes of continuous aerobic exercise in addition to a 5 minute warm-up and a 5 minute cool-down routine.  Nutrition Goals: Your personal nutrition goals will be established when you do your nutrition analysis with the dietician.  The following are nutrition  guidelines to follow: Cholesterol < 200mg /day Sodium < 1500mg /day Fiber: Men over 50 yrs - 30 grams per day  Personal Goals:     Personal Goals and Risk Factors at Admission - 04/14/16 1551      Core Components/Risk Factors/Patient Goals on Admission    Weight Management Yes   Intervention Weight Management: Develop a combined nutrition and exercise program designed to reach desired caloric intake, while maintaining appropriate intake of nutrient and fiber, sodium and fats, and appropriate energy expenditure required for the weight goal.;Weight Management: Provide education and appropriate resources to help participant work on and attain dietary goals.   Admit Weight 238 lb 3.2 oz (108 kg)   Goal Weight: Short Term 233 lb (105.7 kg)   Goal Weight: Long Term 210 lb (95.3 kg)   Expected Outcomes Short Term: Continue to assess and modify interventions until short term weight is achieved;Long Term: Adherence to nutrition and physical activity/exercise program aimed toward attainment of established weight goal;Weight Loss: Understanding of general recommendations for a balanced deficit meal plan, which promotes 1-2 lb weight loss per week and includes a negative energy balance of [phone] kcal/d;Understanding of distribution of calorie intake throughout the day with the consumption of 4-5 meals/snacks;Understanding recommendations for meals to include 15-35% energy as protein, 25-35% energy from fat, 35-60% energy from  carbohydrates, less than 200mg  of dietary cholesterol, 20-35 gm of total fiber daily   Tobacco Cessation Yes   Number of packs per day Mr Cherubini's quit date was 07/2015. Occasionally he has a cigarette, but he is now nauseated when he smokes one.   Improve shortness of breath with ADL's Yes   Intervention Provide education, individualized exercise plan and daily activity instruction to help decrease symptoms of SOB with activities of daily living.   Expected Outcomes Short Term: Achieves  a reduction of symptoms when performing activities of daily living.   Develop more efficient breathing techniques such as purse lipped breathing and diaphragmatic breathing; and practicing self-pacing with activity Yes   Intervention Provide education, demonstration and support about specific breathing techniuqes utilized for more efficient breathing. Include techniques such as pursed lipped breathing, diaphragmatic breathing and self-pacing activity.   Expected Outcomes Short Term: Participant will be able to demonstrate and use breathing techniques as needed throughout daily activities.   Increase knowledge of respiratory medications and ability to use respiratory devices properly  Yes  Spiriva, Symbicort, albuterol MDI - uses spacer.   Intervention Provide education and demonstration as needed of appropriate use of medications, inhalers, and oxygen therapy.   Expected Outcomes Short Term: Achieves understanding of medications use. Understands that oxygen is a medication prescribed by physician. Demonstrates appropriate use of inhaler and oxygen therapy.   Hypertension Yes   Intervention Provide education on lifestyle modifcations including regular physical activity/exercise, weight management, moderate sodium restriction and increased consumption of fresh fruit, vegetables, and low fat dairy, alcohol moderation, and smoking cessation.;Monitor prescription use compliance.   Expected Outcomes Short Term: Continued assessment and intervention until BP is < 140/43mm HG in hypertensive participants. < 130/24mm HG in hypertensive participants with diabetes, heart failure or chronic kidney disease.;Long Term: Maintenance of blood pressure at goal levels.   Lipids Yes   Intervention Provide education and support for participant on nutrition & aerobic/resistive exercise along with prescribed medications to achieve LDL 70mg , HDL >40mg .   Expected Outcomes Short Term: Participant states understanding of  desired cholesterol values and is compliant with medications prescribed. Participant is following exercise prescription and nutrition guidelines.;Long Term: Cholesterol controlled with medications as prescribed, with individualized exercise RX and with personalized nutrition plan. Value goals: LDL < 70mg , HDL > 40 mg.      Tobacco Use Initial Evaluation: History  Smoking Status   Current Some Day Smoker   Packs/day: 2.00   Years: 45.00   Types: Cigarettes  Smokeless Tobacco   Never Used    Copy of goals given to participant.

## 2016-04-14 NOTE — Progress Notes (Signed)
Pulmonary Individual Treatment Plan  Patient Details  Name: Nathaniel [name] MRN: [id number] Date of Birth: 04-22-1955 Referring Provider:     Pulmonary Rehab from 04/14/2016 in [facility] Cardiac and Pulmonary Rehab  Referring Provider  Jeronimo Greaves MD      Initial Encounter Date:    Pulmonary Rehab from 04/14/2016 in [facility] Cardiac and Pulmonary Rehab  Date  04/14/16  Referring Provider  Jeronimo Greaves MD      Visit Diagnosis: Chronic obstructive pulmonary disease, unspecified COPD type ([place])  Patient's Home Medications on Admission:  Current Outpatient Prescriptions:    albuterol (PROVENTIL HFA;VENTOLIN HFA) 108 (90 BASE) MCG/ACT inhaler, Inhale 2 puffs into the lungs every 6 (six) hours as needed for wheezing or shortness of breath., Disp: , Rfl:    albuterol (PROVENTIL HFA;VENTOLIN HFA) 108 (90 BASE) MCG/ACT inhaler, Inhale into the lungs., Disp: , Rfl:    aspirin EC 81 MG tablet, Take 81 mg by mouth., Disp: , Rfl:    budesonide-formoterol (SYMBICORT) 160-4.5 MCG/ACT inhaler, Inhale into the lungs., Disp: , Rfl:    enalapril (VASOTEC) 10 MG tablet, Take 1 tablet by mouth at bedtime., Disp: , Rfl: 11   fluticasone (FLONASE) 50 MCG/ACT nasal spray, 1 spray by Each Nare route two (2) times a day as needed. Frequency:BID   Dosage:50   MCG  Instructions:  Note:Dose: 50MCG, Disp: , Rfl:    hydrocortisone cream 1 %, Apply 1 application topically 2 (two) times daily as needed., Disp: , Rfl:    levalbuterol (XOPENEX HFA) 45 MCG/ACT inhaler, Inhale into the lungs., Disp: , Rfl:    meloxicam (MOBIC) 7.5 MG tablet, Take 15 mg by mouth., Disp: , Rfl:    Na Sulfate-K Sulfate-Mg Sulf (SUPREP BOWEL PREP) SOLN, Take 1 kit by mouth as directed. (Patient not taking: Reported on 02/20/2016), Disp: 1 Bottle, Rfl: 0   oxyCODONE-acetaminophen (PERCOCET/ROXICET) 5-325 MG per tablet, Take 1 tablet by mouth every 6 (six) hours as needed., Disp: , Rfl: 0   pravastatin (PRAVACHOL) 10 MG tablet, Take 10 mg by  mouth daily., Disp: , Rfl:    sildenafil (VIAGRA) 100 MG tablet, Take 1 tablet by mouth as needed., Disp: , Rfl:    tiotropium (SPIRIVA HANDIHALER) 18 MCG inhalation capsule, Place into inhaler and inhale., Disp: , Rfl:   Past Medical History: Past Medical History:  Diagnosis Date   Arthritis    Chronic back pain    Dr. Quay Burow manages (per pt)   COPD (chronic obstructive pulmonary disease) ([place])    Hearing loss    Hyperlipidemia    Hypertension    MGUS (monoclonal gammopathy of unknown significance)    Multiple myeloma (HCC)    Proteinuria    Tobacco abuse     Tobacco Use: History  Smoking Status   Current Some Day Smoker   Packs/day: 2.00   Years: 45.00   Types: Cigarettes  Smokeless Tobacco   Never Used    Labs: Recent Review Flowsheet Data    There is no flowsheet data to display.       ADL UCSD:     Pulmonary Assessment Scores    Row Name 04/14/16 1549         ADL UCSD   ADL Phase Entry     SOB Score total 51     Rest 1     Walk 2     Stairs 4     Bath 1     Dress 0     Shop 0  mMRC Score   mMRC Score 2        Pulmonary Function Assessment:     Pulmonary Function Assessment - 04/14/16 1546      Initial Spirometry Results   FVC% 72.7 %   FEV1% 37.2 %   FEV1/FVC Ratio 39     Post Bronchodilator Spirometry Results   FVC% 84.3 %   FEV1% 43.8 %   FEV1/FVC Ratio 40     Breath   Bilateral Breath Sounds Clear;Decreased   Shortness of Breath Yes;Limiting activity;Fear of Shortness of Breath      Exercise Target Goals: Date: 04/14/16  Exercise Program Goal: Individual exercise prescription set with THRR, safety & activity barriers. Participant demonstrates ability to understand and report RPE using BORG scale, to self-measure pulse accurately, and to acknowledge the importance of the exercise prescription.  Exercise Prescription Goal: Starting with aerobic activity 30 plus minutes a day, 3 days per week for  initial exercise prescription. Provide home exercise prescription and guidelines that participant acknowledges understanding prior to discharge.  Activity Barriers & Risk Stratification:     Activity Barriers & Cardiac Risk Stratification - 04/14/16 1602      Activity Barriers & Cardiac Risk Stratification   Activity Barriers Deconditioning;Shortness of Breath;Joint Problems;Back Problems  back pain, left knee pain      6 Minute Walk:     6 Minute Walk    Row Name 04/14/16 1600         6 Minute Walk   Phase Initial     Distance 1000 feet     Walk Time 6 minutes     # of Rest Breaks 0     MPH 1.89     METS 2.95     RPE 11     Perceived Dyspnea  2     VO2 Peak 10.33     Symptoms Yes (comment)     Comments left knee pain 2/10     Resting HR 101 bpm     Resting BP 126/64     Max Ex. HR 110 bpm     Max Ex. BP 136/70     2 Minute Post BP 136/66       Interval HR   Baseline HR 101     1 Minute HR 107     2 Minute HR 105     3 Minute HR 106     4 Minute HR 106     5 Minute HR 110     6 Minute HR 106     2 Minute Post HR 95     Interval Heart Rate? Yes       Interval Oxygen   Interval Oxygen? Yes     Baseline Oxygen Saturation % 96 %     Baseline Liters of Oxygen 0 L  Room Air     1 Minute Oxygen Saturation % 96 %     1 Minute Liters of Oxygen 0 L     2 Minute Oxygen Saturation % 96 %     2 Minute Liters of Oxygen 0 L     3 Minute Oxygen Saturation % 97 %     3 Minute Liters of Oxygen 0 L     4 Minute Oxygen Saturation % 96 %     4 Minute Liters of Oxygen 0 L     5 Minute Oxygen Saturation % 97 %     5 Minute Liters of Oxygen 0 L  6 Minute Oxygen Saturation % 96 %     6 Minute Liters of Oxygen 0 L     2 Minute Post Oxygen Saturation % 97 %     2 Minute Post Liters of Oxygen 0 L       Oxygen Initial Assessment:   Oxygen Re-Evaluation:   Oxygen Discharge (Final Oxygen Re-Evaluation):   Initial Exercise Prescription:     Initial Exercise  Prescription - 04/14/16 1600      Date of Initial Exercise RX and Referring Provider   Date 04/14/16   Referring Provider Mock, Corene Cornea MD     Elliptical   Level 1   Speed 2.5   Minutes 15     T5 Nustep   Level 3   SPM 80   Minutes 15   METs 2     Track   Laps 25   Minutes 15   METs 2.15     Prescription Details   Frequency (times per week) 3   Duration Progress to 45 minutes of aerobic exercise without signs/symptoms of physical distress     Intensity   THRR 40-80% of Max Heartrate 125-150   Ratings of Perceived Exertion 11-13   Perceived Dyspnea 0-4     Progression   Progression Continue to progress workloads to maintain intensity without signs/symptoms of physical distress.     Resistance Training   Training Prescription Yes   Weight 3 lbs   Reps 10-15      Perform Capillary Blood Glucose checks as needed.  Exercise Prescription Changes:   Exercise Comments:   Exercise Goals and Review:     Exercise Goals    Row Name 04/14/16 1605             Exercise Goals   Increase Physical Activity Yes       Intervention Provide advice, education, support and counseling about physical activity/exercise needs.;Develop an individualized exercise prescription for aerobic and resistive training based on initial evaluation findings, risk stratification, comorbidities and participant's personal goals.       Expected Outcomes Achievement of increased cardiorespiratory fitness and enhanced flexibility, muscular endurance and strength shown through measurements of functional capacity and personal statement of participant.       Increase Strength and Stamina Yes       Intervention Provide advice, education, support and counseling about physical activity/exercise needs.;Develop an individualized exercise prescription for aerobic and resistive training based on initial evaluation findings, risk stratification, comorbidities and participant's personal goals.       Expected  Outcomes Achievement of increased cardiorespiratory fitness and enhanced flexibility, muscular endurance and strength shown through measurements of functional capacity and personal statement of participant.          Exercise Goals Re-Evaluation :   Discharge Exercise Prescription (Final Exercise Prescription Changes):   Nutrition:  Target Goals: Understanding of nutrition guidelines, daily intake of sodium <1576m, cholesterol <2028m calories 30% from fat and 7% or less from saturated fats, daily to have 5 or more servings of fruits and vegetables.  Biometrics:     Pre Biometrics - 04/14/16 1605      Pre Biometrics   Height 6' 0.4" (1.839 m)   Weight 238 lb 3.2 oz (108 kg)   Waist Circumference 43 inches   Hip Circumference 41 inches   Waist to Hip Ratio 1.05 %   BMI (Calculated) 32       Nutrition Therapy Plan and Nutrition Goals:   Nutrition Discharge: Rate Your Plate Scores:  Nutrition Goals Re-Evaluation:   Nutrition Goals Discharge (Final Nutrition Goals Re-Evaluation):   Psychosocial: Target Goals: Acknowledge presence or absence of significant depression and/or stress, maximize coping skills, provide positive support system. Participant is able to verbalize types and ability to use techniques and skills needed for reducing stress and depression.   Initial Review & Psychosocial Screening:     Initial Psych Review & Screening - 04/14/16 1556      Family Dynamics   Good Support System? Yes   Comments Mr Hern has good support from his wife and children. 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 Also Mr Loy and his wife love to dance, but he is unable to dance at the level she has known before his shortness of breath. Mr Ang is looking forward to improving his stamina though his participation in [place].     Barriers   Psychosocial barriers to participate in program There are no  identifiable barriers or psychosocial needs.;The patient should benefit from training in stress management and relaxation.     Screening Interventions   Interventions Encouraged to exercise;Program counselor consult      Quality of Life Scores:     Quality of Life - 04/14/16 1603      Quality of Life Scores   Health/Function Pre 20.81 %   Socioeconomic Pre 19.75 %   Psych/Spiritual Pre 20.86 %   Family Pre 21 %   GLOBAL Pre 20.61 %      PHQ-9: Recent Review Flowsheet Data    Depression screen [facility] 2/9 04/14/2016 01/29/2015 10/17/2014   Decreased Interest 1 1 1    Down, Depressed, Hopeless 1 0 0   PHQ - 2 Score 2 1 1    Altered sleeping 0 - -   Tired, decreased energy 2 - -   Change in appetite 0 - -   Feeling bad or failure about yourself  2 - -   Trouble concentrating 0 - -   Moving slowly or fidgety/restless 0 - -   Suicidal thoughts 0 - -   PHQ-9 Score 6 - -   Difficult doing work/chores Somewhat difficult - -     Interpretation of Total Score  Total Score Depression Severity:  1-4 = Minimal depression, 5-9 = Mild depression, 10-14 = Moderate depression, 15-19 = Moderately severe depression, 20-27 = Severe depression   Psychosocial Evaluation and Intervention:   Psychosocial Re-Evaluation:   Psychosocial Discharge (Final Psychosocial Re-Evaluation):   Education: Education Goals: Education classes will be provided on a weekly basis, covering required topics. Participant will state understanding/return demonstration of topics presented.  Learning Barriers/Preferences:     Learning Barriers/Preferences - 04/14/16 1546      Learning Barriers/Preferences   Learning Barriers None   Learning Preferences None      Education Topics: Initial Evaluation Education: - Verbal, written and demonstration of respiratory meds, RPE/PD scales, oximetry and breathing techniques. Instruction on use of nebulizers and MDIs: cleaning and proper use, rinsing mouth with steroid  doses and importance of monitoring MDI activations.   Pulmonary Rehab from 02/05/2015 in [facility] Cardiac and Pulmonary Rehab  Date  10/17/14  Educator  LB   Instruction Review Code  2- meets goals/outcomes      General Nutrition Guidelines/Fats and Fiber: -Group instruction provided by verbal, written material, models and posters to present the general guidelines for heart healthy nutrition. Gives an explanation and review of dietary fats and  fiber.   Pulmonary Rehab from 01/31/2015 in [facility] Cardiac and Pulmonary Rehab  Date  12/11/14  Educator  CR  Instruction Review Code  2- meets goals/outcomes      Controlling Sodium/Reading Food Labels: -Group verbal and written material supporting the discussion of sodium use in heart healthy nutrition. Review and explanation with models, verbal and written materials for utilization of the food label.   Pulmonary Rehab from 02/05/2015 in [facility] Cardiac and Pulmonary Rehab  Date  11/17/14  Educator  Candiss Norse   Instruction Review Code  2- meets goals/outcomes      Exercise Physiology & Risk Factors: - Group verbal and written instruction with models to review the exercise physiology of the cardiovascular system and associated critical values. Details cardiovascular disease risk factors and the goals associated with each risk factor.   Aerobic Exercise & Resistance Training: - Gives group verbal and written discussion on the health impact of inactivity. On the components of aerobic and resistive training programs and the benefits of this training and how to safely progress through these programs.   Pulmonary Rehab from 02/05/2015 in [facility] Cardiac and Pulmonary Rehab  Date  01/31/15  Educator  SW  Instruction Review Code  2- meets goals/outcomes      Flexibility, Balance, General Exercise Guidelines: - Provides group verbal and written instruction on the benefits of flexibility and balance training programs. Provides general exercise guidelines  with specific guidelines to those with heart or lung disease. Demonstration and skill practice provided.   Pulmonary Rehab from 02/05/2015 in [facility] Cardiac and Pulmonary Rehab  Date  11/15/14  Educator  SW  Instruction Review Code  2- meets goals/outcomes      Stress Management: - Provides group verbal and written instruction about the health risks of elevated stress, cause of high stress, and healthy ways to reduce stress.   Pulmonary Rehab from 02/05/2015 in [facility] Cardiac and Pulmonary Rehab  Date  11/08/14  Educator  [facility]  Instruction Review Code  2- meets goals/outcomes      Depression: - Provides group verbal and written instruction on the correlation between heart/lung disease and depressed mood, treatment options, and the stigmas associated with seeking treatment.   Pulmonary Rehab from 02/05/2015 in [facility] Cardiac and Pulmonary Rehab  Date  01/17/15 Marcelina Morel attended 01/03/15]  Educator  [facility]  Instruction Review Code  2- meets goals/outcomes      Exercise & Equipment Safety: - Individual verbal instruction and demonstration of equipment use and safety with use of the equipment.   Pulmonary Rehab from 02/05/2015 in [facility] Cardiac and Pulmonary Rehab  Date  10/20/14  Educator  [place]  Instruction Review Code  2- meets goals/outcomes      Infection Prevention: - Provides verbal and written material to individual with discussion of infection control including proper hand washing and proper equipment cleaning during exercise session.   Pulmonary Rehab from 02/05/2015 in [facility] Cardiac and Pulmonary Rehab  Date  10/20/14  Educator  [place]  Instruction Review Code  2- meets goals/outcomes      Falls Prevention: - Provides verbal and written material to individual with discussion of falls prevention and safety.   Pulmonary Rehab from 02/05/2015 in [facility] Cardiac and Pulmonary Rehab  Date  10/17/14  Educator  LB  Instruction Review Code  2- meets goals/outcomes      Diabetes: - Individual  verbal and written instruction to review signs/symptoms of diabetes, desired ranges of glucose level fasting, after meals and with exercise. Advice  that pre and post exercise glucose checks will be done for 3 sessions at entry of program.   Chronic Lung Diseases: - Group verbal and written instruction to review new updates, new respiratory medications, new advancements in procedures and treatments. Provide informative websites and "800" numbers of self-education.   Pulmonary Rehab from 02/05/2015 in [facility] Cardiac and Pulmonary Rehab  Date  12/15/14 [facility] Your Numbers]  Educator  CE  Instruction Review Code  2- meets goals/outcomes      Lung Procedures: - Group verbal and written instruction to describe testing methods done to diagnose lung disease. Review the outcome of test results. Describe the treatment choices: Pulmonary Function Tests, ABGs and oximetry.   Pulmonary Rehab from 02/05/2015 in [facility] Cardiac and Pulmonary Rehab  Date  11/10/14  Educator  [place]  Instruction Review Code  2- meets goals/outcomes      Energy Conservation: - Provide group verbal and written instruction for methods to conserve energy, plan and organize activities. Instruct on pacing techniques, use of adaptive equipment and posture/positioning to relieve shortness of breath.   Pulmonary Rehab from 02/05/2015 in [facility] Cardiac and Pulmonary Rehab  Date  11/22/14  Educator  SW  Instruction Review Code  2- meets goals/outcomes      Triggers: - Group verbal and written instruction to review types of environmental controls: home humidity, furnaces, filters, dust mite/pet prevention, HEPA vacuums. To discuss weather changes, air quality and the benefits of nasal washing.   Pulmonary Rehab from 02/05/2015 in [facility] Cardiac and Pulmonary Rehab  Date  01/22/15  Educator  LB  Instruction Review Code  2- meets goals/outcomes      Exacerbations: - Group verbal and written instruction to provide: warning signs, infection  symptoms, calling MD promptly, preventive modes, and value of vaccinations. Review: effective airway clearance, coughing and/or vibration techniques. Create an Sports administrator.   Pulmonary Rehab from 02/05/2015 in [facility] Cardiac and Pulmonary Rehab  Date  12/18/14  Educator  LB  Instruction Review Code  2- meets goals/outcomes      Oxygen: - Individual and group verbal and written instruction on oxygen therapy. Includes supplement oxygen, available portable oxygen systems, continuous and intermittent flow rates, oxygen safety, concentrators, and Medicare reimbursement for oxygen.   Respiratory Medications: - Group verbal and written instruction to review medications for lung disease. Drug class, frequency, complications, importance of spacers, rinsing mouth after steroid MDI's, and proper cleaning methods for nebulizers.   Pulmonary Rehab from 02/05/2015 in [facility] Cardiac and Pulmonary Rehab  Date  10/17/14  Educator  LB  Instruction Review Code  2- meets goals/outcomes      AED/CPR: - Group verbal and written instruction with the use of models to demonstrate the basic use of the AED with the basic ABC's of resuscitation.   Breathing Retraining: - Provides individuals verbal and written instruction on purpose, frequency, and proper technique of diaphragmatic breathing and pursed-lipped breathing. Applies individual practice skills.   Pulmonary Rehab from 02/05/2015 in [facility] Cardiac and Pulmonary Rehab  Date  10/20/14  Educator  [place]  Instruction Review Code  2- meets goals/outcomes      Anatomy and Physiology of the Lungs: - Group verbal and written instruction with the use of models to provide basic lung anatomy and physiology related to function, structure and complications of lung disease.   Heart Failure: - Group verbal and written instruction on the basics of heart failure: signs/symptoms, treatments, explanation of ejection fraction, enlarged heart and cardiomyopathy.   Pulmonary  Rehab from 02/05/2015 in [facility] Cardiac and Pulmonary Rehab  Date  01/26/15  Educator  [place]  Instruction Review Code  2- meets goals/outcomes      Sleep Apnea: - Individual verbal and written instruction to review Obstructive Sleep Apnea. Review of risk factors, methods for diagnosing and types of masks and machines for OSA.   Anxiety: - Provides group, verbal and written instruction on the correlation between heart/lung disease and anxiety, treatment options, and management of anxiety.   Relaxation: - Provides group, verbal and written instruction about the benefits of relaxation for patients with heart/lung disease. Also provides patients with examples of relaxation techniques.   Pulmonary Rehab from 02/05/2015 in [facility] Cardiac and Pulmonary Rehab  Date  12/06/14  Educator  [facility]  Instruction Review Code  2- Meets goals/outcomes      Knowledge Questionnaire Score:     Knowledge Questionnaire Score - 04/14/16 1546      Knowledge Questionnaire Score   Pre Score 10/10       Core Components/Risk Factors/Patient Goals at Admission:     Personal Goals and Risk Factors at Admission - 04/14/16 1551      Core Components/Risk Factors/Patient Goals on Admission    Weight Management Yes   Intervention Weight Management: Develop a combined nutrition and exercise program designed to reach desired caloric intake, while maintaining appropriate intake of nutrient and fiber, sodium and fats, and appropriate energy expenditure required for the weight goal.;Weight Management: Provide education and appropriate resources to help participant work on and attain dietary goals.   Admit Weight 238 lb 3.2 oz (108 kg)   Goal Weight: Short Term 233 lb (105.7 kg)   Goal Weight: Long Term 210 lb (95.3 kg)   Expected Outcomes Short Term: Continue to assess and modify interventions until short term weight is achieved;Long Term: Adherence to nutrition and physical activity/exercise program aimed toward attainment  of established weight goal;Weight Loss: Understanding of general recommendations for a balanced deficit meal plan, which promotes 1-2 lb weight loss per week and includes a negative energy balance of [phone] kcal/d;Understanding of distribution of calorie intake throughout the day with the consumption of 4-5 meals/snacks;Understanding recommendations for meals to include 15-35% energy as protein, 25-35% energy from fat, 35-60% energy from carbohydrates, less than 211m of dietary cholesterol, 20-35 gm of total fiber daily   Tobacco Cessation Yes   Number of packs per day Mr Detienne's quit date was 07/2015. Occasionally he has a cigarette, but he is now nauseated when he smokes one.   Improve shortness of breath with ADL's Yes   Intervention Provide education, individualized exercise plan and daily activity instruction to help decrease symptoms of SOB with activities of daily living.   Expected Outcomes Short Term: Achieves a reduction of symptoms when performing activities of daily living.   Develop more efficient breathing techniques such as purse lipped breathing and diaphragmatic breathing; and practicing self-pacing with activity Yes   Intervention Provide education, demonstration and support about specific breathing techniuqes utilized for more efficient breathing. Include techniques such as pursed lipped breathing, diaphragmatic breathing and self-pacing activity.   Expected Outcomes Short Term: Participant will be able to demonstrate and use breathing techniques as needed throughout daily activities.   Increase knowledge of respiratory medications and ability to use respiratory devices properly  Yes  Spiriva, Symbicort, albuterol MDI - uses spacer.   Intervention Provide education and demonstration as needed of appropriate use of medications, inhalers, and oxygen therapy.   Expected Outcomes Short Term:  Achieves understanding of medications use. Understands that oxygen is a medication prescribed by  physician. Demonstrates appropriate use of inhaler and oxygen therapy.   Hypertension Yes   Intervention Provide education on lifestyle modifcations including regular physical activity/exercise, weight management, moderate sodium restriction and increased consumption of fresh fruit, vegetables, and low fat dairy, alcohol moderation, and smoking cessation.;Monitor prescription use compliance.   Expected Outcomes Short Term: Continued assessment and intervention until BP is < 140/2m HG in hypertensive participants. < 130/871mHG in hypertensive participants with diabetes, heart failure or chronic kidney disease.;Long Term: Maintenance of blood pressure at goal levels.   Lipids Yes   Intervention Provide education and support for participant on nutrition & aerobic/resistive exercise along with prescribed medications to achieve LDL <7061mHDL >38m60m Expected Outcomes Short Term: Participant states understanding of desired cholesterol values and is compliant with medications prescribed. Participant is following exercise prescription and nutrition guidelines.;Long Term: Cholesterol controlled with medications as prescribed, with individualized exercise RX and with personalized nutrition plan. Value goals: LDL < 70mg64mL > 40 mg.      Core Components/Risk Factors/Patient Goals Review:    Core Components/Risk Factors/Patient Goals at Discharge (Final Review):    ITP Comments:   Comments: Mr SmithBurbys to start 04/21/16 and attend 3 days/week.

## 2016-04-21 DIAGNOSIS — J449 Chronic obstructive pulmonary disease, unspecified: Secondary | ICD-10-CM

## 2016-04-21 NOTE — Progress Notes (Signed)
Daily Session Note  Patient Details  Name: Nathaniel [name] MRN: [id number] Date of Birth: [birth year] Referring Provider:     Pulmonary Rehab from 04/14/2016 in [facility] Cardiac and Pulmonary Rehab  Referring Provider  Jeronimo Greaves MD      Encounter Date: 04/21/2016  Check In:     Session Check In - 04/21/16 1253      Check-In   Location [facility]-Cardiac & Pulmonary Rehab   Staff Present Earlean Shawl, BS, ACSM CEP, Exercise Physiologist;Laureen Owens Shark, BS, RRT, Respiratory Dareen Piano, BA, ACSM CEP, Exercise Physiologist   Supervising physician immediately available to respond to emergencies [facility] immediately available ER MD   Physician(s) Mable Paris and Kinner   Medication changes reported     No   Fall or balance concerns reported    No   Warm-up and Cool-down Performed as group-led instruction   Resistance Training Performed Yes   VAD Patient? No     Pain Assessment   Currently in Pain? No/denies         History  Smoking Status  . Current Some Day Smoker  . Packs/day: 2.00  . Years: 45.00  . Types: Cigarettes  Smokeless Tobacco  . Never Used    Goals Met:  Proper associated with RPD/PD & O2 Sat Independence with exercise equipment Exercise tolerated well Strength training completed today  Goals Unmet:  Not Applicable  Comments: First full day of exercise!  Patient was oriented to gym and equipment including functions, settings, policies, and procedures.  Patient's individual exercise prescription and treatment plan were reviewed.  All starting workloads were established based on the results of the 6 minute walk test done at initial orientation visit.  The plan for exercise progression was also introduced and progression will be customized based on patient's performance and goals.    Dr. Emily Filbert is Medical Director for [place] and [facility].

## 2016-04-23 ENCOUNTER — Encounter: Payer: Medicaid Other | Admitting: *Deleted

## 2016-04-23 DIAGNOSIS — J449 Chronic obstructive pulmonary disease, unspecified: Secondary | ICD-10-CM

## 2016-04-23 NOTE — Progress Notes (Signed)
Daily Session Note  Patient Details  Name: Nathaniel [name] MRN: [id number] Date of Birth: 27-May-1955 Referring Provider:     Pulmonary Rehab from 04/14/2016 in [facility] Cardiac and Pulmonary Rehab  Referring Provider  Jeronimo Greaves MD      Encounter Date: 04/23/2016  Check In:     Session Check In - 04/23/16 1129      Check-In   Location [facility]-Cardiac & Pulmonary Rehab   Staff Present Alberteen Sam, MA, ACSM RCEP, Exercise Physiologist;Laureen Owens Shark, BS, RRT, Respiratory Therapist;Rebecca Brayton El, DPT, CEEA   Supervising physician immediately available to respond to emergencies [facility] immediately available ER MD   Physician(s) Drs. McShane and Robinson   Medication changes reported     No   Fall or balance concerns reported    No   Warm-up and Cool-down Performed as group-led Location manager Performed Yes   VAD Patient? No     Pain Assessment   Currently in Pain? No/denies   Multiple Pain Sites No         History  Smoking Status  . Current Some Day Smoker  . Packs/day: 2.00  . Years: 45.00  . Types: Cigarettes  Smokeless Tobacco  . Never Used    Goals Met:  Proper associated with RPD/PD & O2 Sat Using PLB without cueing & demonstrates good technique Exercise tolerated well Strength training completed today  Goals Unmet:  Not Applicable  Comments: Pt able to follow exercise prescription today without complaint.  Will continue to monitor for progression. After today, he is willing to try the treadmill for walking.   Dr. Emily Filbert is Medical Director for [place] and [facility].

## 2016-04-25 ENCOUNTER — Encounter: Payer: Medicaid Other | Admitting: *Deleted

## 2016-04-25 DIAGNOSIS — J449 Chronic obstructive pulmonary disease, unspecified: Secondary | ICD-10-CM

## 2016-04-25 NOTE — Progress Notes (Signed)
Daily Session Note  Patient Details  Name: Nathaniel [name] MRN: [id number] Date of Birth: 19-Nov-1955 Referring Provider:     Pulmonary Rehab from 04/14/2016 in [facility] Cardiac and Pulmonary Rehab  Referring Provider  Jeronimo Greaves MD      Encounter Date: 04/25/2016  Check In:     Session Check In - 04/25/16 1234      Check-In   Location [facility]-Cardiac & Pulmonary Rehab   Staff Present Nyoka Cowden, RN, BSN, Willette Pa, MA, ACSM RCEP, Exercise Physiologist   Supervising physician immediately available to respond to emergencies [facility] immediately available ER MD   Physician(s) Drs. McShane and Robinson   Medication changes reported     No   Fall or balance concerns reported    No   Warm-up and Cool-down Performed as group-led Location manager Performed Yes   VAD Patient? No     Pain Assessment   Currently in Pain? No/denies   Multiple Pain Sites No         History  Smoking Status  . Current Some Day Smoker  . Packs/day: 2.00  . Years: 45.00  . Types: Cigarettes  Smokeless Tobacco  . Never Used    Goals Met:  Proper associated with RPD/PD & O2 Sat Independence with exercise equipment Using PLB without cueing & demonstrates good technique Exercise tolerated well Strength training completed today  Goals Unmet:  Not Applicable  Comments: Pt able to follow exercise prescription today without complaint.  Will continue to monitor for progression.    Dr. Emily Filbert is Medical Director for [place] and [facility].

## 2016-04-28 DIAGNOSIS — J449 Chronic obstructive pulmonary disease, unspecified: Secondary | ICD-10-CM | POA: Diagnosis not present

## 2016-04-28 NOTE — Progress Notes (Signed)
Daily Session Note  Patient Details  Name: July Nickson MRN: [id number] Date of Birth: 08-25-1955 Referring Provider:     Pulmonary Rehab from 04/14/2016 in [facility] Cardiac and Pulmonary Rehab  Referring Provider  Jeronimo Greaves MD      Encounter Date: 04/28/2016  Check In:     Session Check In - 04/28/16 1226      Check-In   Location [facility]-Cardiac & Pulmonary Rehab   Staff Present Carson Myrtle, BS, RRT, Respiratory Therapist;Kelly Amedeo Plenty, BS, ACSM CEP, Exercise Physiologist;Amanda Oletta Darter, BA, ACSM CEP, Exercise Physiologist   Supervising physician immediately available to respond to emergencies [facility] immediately available ER MD   Physician(s) Merrie Roof and Alfred Levins   Medication changes reported     No   Fall or balance concerns reported    No   Warm-up and Cool-down Performed as group-led Location manager Performed Yes   VAD Patient? No     Pain Assessment   Currently in Pain? No/denies         History  Smoking Status  . Current Some Day Smoker  . Packs/day: 2.00  . Years: 45.00  . Types: Cigarettes  Smokeless Tobacco  . Never Used    Goals Met:  Proper associated with RPD/PD & O2 Sat Independence with exercise equipment Exercise tolerated well Strength training completed today  Goals Unmet:  Not Applicable  Comments: Pt able to follow exercise prescription today without complaint.  Will continue to monitor for progression.    Dr. Emily Filbert is Medical Director for [place] and [facility].

## 2016-04-30 ENCOUNTER — Encounter: Payer: Medicaid Other | Admitting: *Deleted

## 2016-04-30 ENCOUNTER — Encounter: Payer: Self-pay | Admitting: *Deleted

## 2016-04-30 DIAGNOSIS — J449 Chronic obstructive pulmonary disease, unspecified: Secondary | ICD-10-CM

## 2016-04-30 NOTE — Progress Notes (Signed)
Daily Session Note  Patient Details  Name: Nathaniel [name] MRN: [id number] Date of Birth: [birth year] Referring Provider:     Pulmonary Rehab from 04/14/2016 in [facility] Cardiac and Pulmonary Rehab  Referring Provider  Jeronimo Greaves MD      Encounter Date: 04/30/2016  Check In:     Session Check In - 04/30/16 1136      Check-In   Location [facility]-Cardiac & Pulmonary Rehab   Staff Present Nyoka Cowden, RN, BSN, Willette Pa, MA, ACSM RCEP, Exercise Physiologist;Laureen Janell Quiet, RRT, Respiratory Therapist   Supervising physician immediately available to respond to emergencies [facility] immediately available ER MD   Physician(s) Drs. Joni Fears and [place]   Medication changes reported     No   Fall or balance concerns reported    No   Tobacco Cessation No Change   Warm-up and Cool-down Performed as group-led Location manager Performed Yes   VAD Patient? No     Pain Assessment   Currently in Pain? No/denies   Multiple Pain Sites No           Exercise Prescription Changes - 04/30/16 1500      Response to Exercise   Blood Pressure (Admit) 138/78   Blood Pressure (Exercise) 160/80   Blood Pressure (Exit) 126/84   Heart Rate (Admit) 71 bpm   Heart Rate (Exercise) 128 bpm   Heart Rate (Exit) 102 bpm   Oxygen Saturation (Admit) 98 %   Oxygen Saturation (Exercise) 96 %   Oxygen Saturation (Exit) 93 %   Rating of Perceived Exertion (Exercise) 15   Perceived Dyspnea (Exercise) 2   Symptoms SOB   Duration Progress to 45 minutes of aerobic exercise without signs/symptoms of physical distress   Intensity THRR unchanged     Progression   Progression Continue to progress workloads to maintain intensity without signs/symptoms of physical distress.   Average METs 2.91     Resistance Training   Training Prescription Yes   Weight 3 lbs   Reps 10-15     Interval Training   Interval Training No     Treadmill   MPH 2.5   Grade 0   Minutes 11   METs 2.91      Elliptical   Level 3   Speed 3.2   Minutes 15     T5 Nustep   Level 3   SPM 87   Minutes 15   METs 2.9      History  Smoking Status  . Former Smoker  . Years: 45.00  . Types: Cigarettes  . Quit date: 07/2015  Smokeless Tobacco  . Never Used    Comment: no cigarettes currently    Goals Met:  Proper associated with RPD/PD & O2 Sat Independence with exercise equipment Using PLB without cueing & demonstrates good technique Exercise tolerated well Strength training completed today  Goals Unmet:  Not Applicable  Comments: Pt able to follow exercise prescription today without complaint.  Will continue to monitor for progression.    Dr. Emily Filbert is Medical Director for [place] and [facility].

## 2016-05-02 ENCOUNTER — Encounter: Payer: Medicaid Other | Admitting: *Deleted

## 2016-05-02 DIAGNOSIS — J449 Chronic obstructive pulmonary disease, unspecified: Secondary | ICD-10-CM | POA: Diagnosis not present

## 2016-05-02 NOTE — Progress Notes (Addendum)
Daily Session Note  Patient Details  Name: Nathaniel [name] MRN: [id number] Date of Birth: 09-02-1955 Referring Provider:     Pulmonary Rehab from 04/14/2016 in [facility] Cardiac and Pulmonary Rehab  Referring Provider  Jeronimo Greaves MD      Encounter Date: 05/02/2016  Check In:     Session Check In - 05/02/16 1142      Check-In   Location [facility]-Cardiac & Pulmonary Rehab   Staff Present Nyoka Cowden, RN, BSN, Willette Pa, MA, ACSM RCEP, Exercise Physiologist  Darel Hong RN BSN   Supervising physician immediately available to respond to emergencies [facility] immediately available ER MD   Physician(s) Drs. Quale and McShane   Medication changes reported     No   Fall or balance concerns reported    No   Tobacco Cessation No Change   Warm-up and Cool-down Performed as group-led instruction   Resistance Training Performed Yes   VAD Patient? No     Pain Assessment   Currently in Pain? No/denies   Multiple Pain Sites No         History  Smoking Status  . Former Smoker  . Years: 45.00  . Types: Cigarettes  . Quit date: 07/2015  Smokeless Tobacco  . Never Used    Comment: no cigarettes currently    Goals Met:  Proper associated with RPD/PD & O2 Sat Independence with exercise equipment Using PLB without cueing & demonstrates good technique Exercise tolerated well Strength training completed today  Goals Unmet:  Not Applicable  Comments: Pt able to follow exercise prescription today without complaint.  Will continue to monitor for progression.  Reviewed home exercise with pt today.  Pt plans to walk and go to [employer] for exercise.  Reviewed THR, pulse, RPE, sign and symptoms, and when to call 911 or MD.  Also discussed weather considerations and indoor options.  Pt voiced understanding.   Dr. Emily Filbert is Medical Director for [place] and [facility].

## 2016-05-05 DIAGNOSIS — J449 Chronic obstructive pulmonary disease, unspecified: Secondary | ICD-10-CM | POA: Diagnosis not present

## 2016-05-05 NOTE — Progress Notes (Signed)
Daily Session Note  Patient Details  Name: Nathaniel [name] MRN: [id number] Date of Birth: 06-22-1955 Referring Provider:     Pulmonary Rehab from 04/14/2016 in [facility] Cardiac and Pulmonary Rehab  Referring Provider  Jeronimo Greaves MD      Encounter Date: 05/05/2016  Check In:     Session Check In - 05/05/16 1536      Check-In   Location [facility]-Cardiac & Pulmonary Rehab   Staff Present Carson Myrtle, BS, RRT, Respiratory Therapist;Kelly Amedeo Plenty, BS, ACSM CEP, Exercise Physiologist;Timmie Dugue Oletta Darter, BA, ACSM CEP, Exercise Physiologist   Supervising physician immediately available to respond to emergencies [facility] immediately available ER MD   Physician(s) Clearnce Hasten and Corky Downs   Medication changes reported     No   Fall or balance concerns reported    No   Warm-up and Cool-down Performed as group-led Location manager Performed Yes   VAD Patient? No         History  Smoking Status  . Former Smoker  . Years: 45.00  . Types: Cigarettes  . Quit date: 07/2015  Smokeless Tobacco  . Never Used    Comment: no cigarettes currently    Goals Met:  Proper associated with RPD/PD & O2 Sat Independence with exercise equipment Exercise tolerated well Strength training completed today  Goals Unmet:  Not Applicable  Comments: Pt able to follow exercise prescription today without complaint.  Will continue to monitor for progression.    Dr. Emily Filbert is Medical Director for [place] and [facility].

## 2016-05-07 ENCOUNTER — Encounter: Payer: Medicaid Other | Attending: Internal Medicine | Admitting: *Deleted

## 2016-05-07 DIAGNOSIS — J449 Chronic obstructive pulmonary disease, unspecified: Secondary | ICD-10-CM | POA: Diagnosis not present

## 2016-05-07 NOTE — Progress Notes (Signed)
Qp-p-PDaily Session Note  Patient Details  Name: Nathaniel [name] MRN: [id number] Date of Birth: 10-13-1955 Referring Provider:     Pulmonary Rehab from 04/14/2016 in [facility] Cardiac and Pulmonary Rehab  Referring Provider  Jeronimo Greaves MD      Encounter Date: 05/07/2016  Check In:     Session Check In - 05/07/16 1139      Check-In   Location [facility]-Cardiac & Pulmonary Rehab   Staff Present Alberteen Sam, MA, ACSM RCEP, Exercise Physiologist;Other;Amanda Oletta Darter, BA, ACSM CEP, Exercise Physiologist  Darel Hong, RN, BSN   Supervising physician immediately available to respond to emergencies [facility] immediately available ER MD   Physician(s) Drs. Mariea Clonts and Lord   Medication changes reported     No   Fall or balance concerns reported    No   Warm-up and Cool-down Performed as group-led Location manager Performed Yes   VAD Patient? No     Pain Assessment   Currently in Pain? No/denies   Multiple Pain Sites No         History  Smoking Status  . Former Smoker  . Years: 45.00  . Types: Cigarettes  . Quit date: 07/2015  Smokeless Tobacco  . Never Used    Comment: no cigarettes currently    Goals Met:  Proper associated with RPD/PD & O2 Sat Independence with exercise equipment Using PLB without cueing & demonstrates good technique Exercise tolerated well Strength training completed today  Goals Unmet:  Not Applicable  Comments: Pt able to follow exercise prescription today without complaint.  Will continue to monitor for progression.    Dr. Emily Filbert is Medical Director for [place] and [facility].

## 2016-05-09 ENCOUNTER — Encounter: Payer: Medicaid Other | Admitting: *Deleted

## 2016-05-09 DIAGNOSIS — J449 Chronic obstructive pulmonary disease, unspecified: Secondary | ICD-10-CM

## 2016-05-09 NOTE — Progress Notes (Signed)
Daily Session Note  Patient Details  Name: Nathaniel [name] MRN: [id number] Date of Birth: February 14, 1955 Referring Provider:     Pulmonary Rehab from 04/14/2016 in [facility] Cardiac and Pulmonary Rehab  Referring Provider  Jeronimo Greaves MD      Encounter Date: 05/09/2016  Check In:     Session Check In - 05/09/16 1320      Check-In   Location [facility]-Cardiac & Pulmonary Rehab   Staff Present Heath Lark, RN, BSN, CCRP;Jessica [place], MA, ACSM RCEP, Exercise Physiologist  Darel Hong RN BSN   Supervising physician immediately available to respond to emergencies [facility] immediately available ER MD   Physician(s) Drs. Shaevitz and [employer]   Medication changes reported     No   Fall or balance concerns reported    No   Tobacco Cessation No Change   Warm-up and Cool-down Performed as group-led Location manager Performed Yes   VAD Patient? No     Pain Assessment   Currently in Pain? No/denies         History  Smoking Status  . Former Smoker  . Years: 45.00  . Types: Cigarettes  . Quit date: 07/2015  Smokeless Tobacco  . Never Used    Comment: no cigarettes currently    Goals Met:  Proper associated with RPD/PD & O2 Sat Independence with exercise equipment Using PLB without cueing & demonstrates good technique Exercise tolerated well Strength training completed today  Goals Unmet:  Not Applicable  Comments: Pt able to follow exercise prescription today without complaint.  Will continue to monitor for progression.    Dr. Emily Filbert is Medical Director for [place] and [facility].

## 2016-05-12 ENCOUNTER — Encounter: Payer: Self-pay | Admitting: Respiratory Therapy

## 2016-05-12 ENCOUNTER — Encounter: Payer: Medicaid Other | Admitting: Respiratory Therapy

## 2016-05-12 DIAGNOSIS — J449 Chronic obstructive pulmonary disease, unspecified: Secondary | ICD-10-CM | POA: Diagnosis not present

## 2016-05-12 NOTE — Progress Notes (Signed)
Incomplete Session Note  Patient Details  Name: Nathaniel [name] MRN: [id number] Date of Birth: September 13, 1955 Referring Provider:     Pulmonary Rehab from 04/14/2016 in [facility] Cardiac and Pulmonary Rehab  Referring Provider  Mock, Corene Cornea MD      [name] did not complete his rehab session due to illness - head congestion and sore throat. Recommended he call his physician for an antibiotic prescription.

## 2016-05-12 NOTE — Progress Notes (Signed)
Pulmonary Individual Treatment Plan  Patient Details  Name: Nathaniel [name] MRN: [id number] Date of Birth: 03/06/1955 Referring Provider:     Pulmonary Rehab from 04/14/2016 in [facility] Cardiac and Pulmonary Rehab  Referring Provider  Jeronimo Greaves MD      Initial Encounter Date:    Pulmonary Rehab from 04/14/2016 in [facility] Cardiac and Pulmonary Rehab  Date  04/14/16  Referring Provider  Mock, Corene Cornea MD      Visit Diagnosis: COPD, moderate ([place])  Patient's Home Medications on Admission:  Current Outpatient Prescriptions:    albuterol (PROVENTIL HFA;VENTOLIN HFA) 108 (90 BASE) MCG/ACT inhaler, Inhale 2 puffs into the lungs every 6 (six) hours as needed for wheezing or shortness of breath., Disp: , Rfl:    albuterol (PROVENTIL HFA;VENTOLIN HFA) 108 (90 BASE) MCG/ACT inhaler, Inhale into the lungs., Disp: , Rfl:    aspirin EC 81 MG tablet, Take 81 mg by mouth., Disp: , Rfl:    budesonide-formoterol (SYMBICORT) 160-4.5 MCG/ACT inhaler, Inhale into the lungs., Disp: , Rfl:    enalapril (VASOTEC) 10 MG tablet, Take 1 tablet by mouth at bedtime., Disp: , Rfl: 11   fluticasone (FLONASE) 50 MCG/ACT nasal spray, 1 spray by Each Nare route two (2) times a day as needed. Frequency:BID   Dosage:50   MCG  Instructions:  Note:Dose: 50MCG, Disp: , Rfl:    hydrocortisone cream 1 %, Apply 1 application topically 2 (two) times daily as needed., Disp: , Rfl:    levalbuterol (XOPENEX HFA) 45 MCG/ACT inhaler, Inhale into the lungs., Disp: , Rfl:    meloxicam (MOBIC) 7.5 MG tablet, Take 15 mg by mouth., Disp: , Rfl:    Na Sulfate-K Sulfate-Mg Sulf (SUPREP BOWEL PREP) SOLN, Take 1 kit by mouth as directed. (Patient not taking: Reported on 02/20/2016), Disp: 1 Bottle, Rfl: 0   oxyCODONE-acetaminophen (PERCOCET/ROXICET) 5-325 MG per tablet, Take 1 tablet by mouth every 6 (six) hours as needed., Disp: , Rfl: 0   pravastatin (PRAVACHOL) 10 MG tablet, Take 10 mg by mouth daily., Disp: , Rfl:    sildenafil  (VIAGRA) 100 MG tablet, Take 1 tablet by mouth as needed., Disp: , Rfl:    tiotropium (SPIRIVA HANDIHALER) 18 MCG inhalation capsule, Place into inhaler and inhale., Disp: , Rfl:   Past Medical History: Past Medical History:  Diagnosis Date   Arthritis    Chronic back pain    Dr. Quay Burow manages (per pt)   COPD (chronic obstructive pulmonary disease) ([place])    Hearing loss    Hyperlipidemia    Hypertension    MGUS (monoclonal gammopathy of unknown significance)    Multiple myeloma (HCC)    Proteinuria    Tobacco abuse     Tobacco Use: History  Smoking Status   Former Smoker   Years: 45.00   Types: Cigarettes   Quit date: 07/2015  Smokeless Tobacco   Never Used    Comment: no cigarettes currently    Labs: Recent Review Flowsheet Data    There is no flowsheet data to display.       ADL UCSD:     Pulmonary Assessment Scores    Row Name 04/14/16 1549         ADL UCSD   ADL Phase Entry     SOB Score total 51     Rest 1     Walk 2     Stairs 4     Bath 1     Dress 0     Shop  0       mMRC Score   mMRC Score 2        Pulmonary Function Assessment:     Pulmonary Function Assessment - 04/14/16 1546      Initial Spirometry Results   FVC% 72.7 %   FEV1% 37.2 %   FEV1/FVC Ratio 39     Post Bronchodilator Spirometry Results   FVC% 84.3 %   FEV1% 43.8 %   FEV1/FVC Ratio 40     Breath   Bilateral Breath Sounds Clear;Decreased   Shortness of Breath Yes;Limiting activity;Fear of Shortness of Breath      Exercise Target Goals:    Exercise Program Goal: Individual exercise prescription set with THRR, safety & activity barriers. Participant demonstrates ability to understand and report RPE using BORG scale, to self-measure pulse accurately, and to acknowledge the importance of the exercise prescription.  Exercise Prescription Goal: Starting with aerobic activity 30 plus minutes a day, 3 days per week for initial exercise prescription.  Provide home exercise prescription and guidelines that participant acknowledges understanding prior to discharge.  Activity Barriers & Risk Stratification:     Activity Barriers & Cardiac Risk Stratification - 04/14/16 1602      Activity Barriers & Cardiac Risk Stratification   Activity Barriers Deconditioning;Shortness of Breath;Joint Problems;Back Problems  back pain, left knee pain      6 Minute Walk:     6 Minute Walk    Row Name 04/14/16 1600         6 Minute Walk   Phase Initial     Distance 1000 feet     Walk Time 6 minutes     # of Rest Breaks 0     MPH 1.89     METS 2.95     RPE 11     Perceived Dyspnea  2     VO2 Peak 10.33     Symptoms Yes (comment)     Comments left knee pain 2/10     Resting HR 101 bpm     Resting BP 126/64     Max Ex. HR 110 bpm     Max Ex. BP 136/70     2 Minute Post BP 136/66       Interval HR   Baseline HR 101     1 Minute HR 107     2 Minute HR 105     3 Minute HR 106     4 Minute HR 106     5 Minute HR 110     6 Minute HR 106     2 Minute Post HR 95     Interval Heart Rate? Yes       Interval Oxygen   Interval Oxygen? Yes     Baseline Oxygen Saturation % 96 %     Baseline Liters of Oxygen 0 L  Room Air     1 Minute Oxygen Saturation % 96 %     1 Minute Liters of Oxygen 0 L     2 Minute Oxygen Saturation % 96 %     2 Minute Liters of Oxygen 0 L     3 Minute Oxygen Saturation % 97 %     3 Minute Liters of Oxygen 0 L     4 Minute Oxygen Saturation % 96 %     4 Minute Liters of Oxygen 0 L     5 Minute Oxygen Saturation % 97 %     5 Minute  Liters of Oxygen 0 L     6 Minute Oxygen Saturation % 96 %     6 Minute Liters of Oxygen 0 L     2 Minute Post Oxygen Saturation % 97 %     2 Minute Post Liters of Oxygen 0 L       Oxygen Initial Assessment:     Oxygen Initial Assessment - 04/29/16 0642      Home Oxygen   Home Oxygen Device None      Oxygen Re-Evaluation:   Oxygen Discharge (Final Oxygen  Re-Evaluation):   Initial Exercise Prescription:     Initial Exercise Prescription - 04/14/16 1600      Date of Initial Exercise RX and Referring Provider   Date 04/14/16   Referring Provider Mock, Corene Cornea MD     Elliptical   Level 1   Speed 2.5   Minutes 15     T5 Nustep   Level 3   SPM 80   Minutes 15   METs 2     Track   Laps 25   Minutes 15   METs 2.15     Prescription Details   Frequency (times per week) 3   Duration Progress to 45 minutes of aerobic exercise without signs/symptoms of physical distress     Intensity   THRR 40-80% of Max Heartrate 125-150   Ratings of Perceived Exertion 11-13   Perceived Dyspnea 0-4     Progression   Progression Continue to progress workloads to maintain intensity without signs/symptoms of physical distress.     Resistance Training   Training Prescription Yes   Weight 3 lbs   Reps 10-15      Perform Capillary Blood Glucose checks as needed.  Exercise Prescription Changes:     Exercise Prescription Changes    Row Name 04/21/16 1300 04/30/16 1500 05/02/16 1300         Response to Exercise   Blood Pressure (Admit) 122/78 138/78  --     Blood Pressure (Exercise) 164/78 160/80  --     Blood Pressure (Exit) 118/70 126/84  --     Heart Rate (Admit) 82 bpm 71 bpm  --     Heart Rate (Exercise) 123 bpm 128 bpm  --     Heart Rate (Exit) 105 bpm 102 bpm  --     Oxygen Saturation (Admit) 96 % 98 %  --     Oxygen Saturation (Exercise) 96 % 96 %  --     Oxygen Saturation (Exit) 94 % 93 %  --     Rating of Perceived Exertion (Exercise) 17 15  --     Perceived Dyspnea (Exercise) 3 2  --     Symptoms SOB SOB SOB     Comments first full day of exercise  --  --     Duration Progress to 45 minutes of aerobic exercise without signs/symptoms of physical distress Progress to 45 minutes of aerobic exercise without signs/symptoms of physical distress Progress to 45 minutes of aerobic exercise without signs/symptoms of physical distress      Intensity THRR unchanged THRR unchanged THRR unchanged       Progression   Progression Continue to progress workloads to maintain intensity without signs/symptoms of physical distress. Continue to progress workloads to maintain intensity without signs/symptoms of physical distress. Continue to progress workloads to maintain intensity without signs/symptoms of physical distress.     Average METs 2.28 2.91 2.91  Resistance Training   Training Prescription Yes Yes Yes     Weight 3 lbs 3 lbs 3 lbs     Reps 10-15 10-15 10-15       Interval Training   Interval Training No No No       Treadmill   MPH  -- 2.5 2.5     Grade  -- 0 0     Minutes  -- 11 11     METs  -- 2.91 2.91       Elliptical   Level 1 3 3      Speed 2.5 3.2 3.2     Minutes 13  8 min, 5 min 15 15       T5 Nustep   Level 3 3 3      SPM 85 87 87     Minutes 15 15 15      METs 2.4 2.9 2.9       Track   Laps 25  --  --     Minutes 15  --  --     METs 2.15  --  --       Home Exercise Plan   Plans to continue exercise at  --  -- [employer] (comment)  [employer]     Frequency  --  -- Add 1 additional day to program exercise sessions.     Initial Home Exercises Provided  --  -- 05/02/16        Exercise Comments:     Exercise Comments    Row Name 04/21/16 1255 04/23/16 1536         Exercise Comments First full day of exercise!  Patient was oriented to gym and equipment including functions, settings, policies, and procedures.  Patient's individual exercise prescription and treatment plan were reviewed.  All starting workloads were established based on the results of the 6 minute walk test done at initial orientation visit.  The plan for exercise progression was also introduced and progression will be customized based on patient's performance and goals. After today, he is willing to try the treadmill for walking.         Exercise Goals and Review:     Exercise Goals    Row Name 04/14/16 1605              Exercise Goals   Increase Physical Activity Yes       Intervention Provide advice, education, support and counseling about physical activity/exercise needs.;Develop an individualized exercise prescription for aerobic and resistive training based on initial evaluation findings, risk stratification, comorbidities and participant's personal goals.       Expected Outcomes Achievement of increased cardiorespiratory fitness and enhanced flexibility, muscular endurance and strength shown through measurements of functional capacity and personal statement of participant.       Increase Strength and Stamina Yes       Intervention Provide advice, education, support and counseling about physical activity/exercise needs.;Develop an individualized exercise prescription for aerobic and resistive training based on initial evaluation findings, risk stratification, comorbidities and participant's personal goals.       Expected Outcomes Achievement of increased cardiorespiratory fitness and enhanced flexibility, muscular endurance and strength shown through measurements of functional capacity and personal statement of participant.          Exercise Goals Re-Evaluation :     Exercise Goals Re-Evaluation    Row Name 04/30/16 1537 05/02/16 1344           Exercise Goal Re-Evaluation  Exercise Goals Review Increase Physical Activity;Increase Strenth and Stamina Increase Physical Activity;Increase Strenth and Stamina      Comments Zyshonne is off to a good start in rehab.  He is now walking on the treadmill at 2.49mh versus the track.  He has also increased his stepper and ellipitcal to level 3.  We will continue to monitor Sachin's progression. Reviewed home exercise with pt today.  Pt plans to walk and [facility] [employer].  Reviewed THR, pulse, RPE, sign and symptoms, and when to call 911 or MD.  Also discussed weather considerations and indoor options.  Pt voiced understanding.      Expected  Outcomes Short: PAbouwill start to add incline to the treadmill.  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  Long: Become more independent at exercising on his own.         Discharge Exercise Prescription (Final Exercise Prescription Changes):     Exercise Prescription Changes - 05/02/16 1300      Response to Exercise   Symptoms SOB   Duration Progress to 45 minutes of aerobic exercise without signs/symptoms of physical distress   Intensity THRR unchanged     Progression   Progression Continue to progress workloads to maintain intensity without signs/symptoms of physical distress.   Average METs 2.91     Resistance Training   Training Prescription Yes   Weight 3 lbs   Reps 10-15     Interval Training   Interval Training No     Treadmill   MPH 2.5   Grade 0   Minutes 11   METs 2.91     Elliptical   Level 3   Speed 3.2   Minutes 15     T5 Nustep   Level 3   SPM 87   Minutes 15   METs 2.9     Home Exercise Plan   Plans to continue exercise at [employer](comment)  [employer]   Frequency Add 1 additional day to program exercise sessions.   Initial Home Exercises Provided 05/02/16      Nutrition:  Target Goals: Understanding of nutrition guidelines, daily intake of sodium <1508m cholesterol <20052mcalories 30% from fat and 7% or less from saturated fats, daily to have 5 or more servings of fruits and vegetables.  Biometrics:     Pre Biometrics - 04/14/16 1605      Pre Biometrics   Height 6' 0.4" (1.839 m)   Weight 238 lb 3.2 oz (108 kg)   Waist Circumference 43 inches   Hip Circumference 41 inches   Waist to Hip Ratio 1.05 %   BMI (Calculated) 32       Nutrition Therapy Plan and Nutrition Goals:   Nutrition Discharge: Rate Your Plate Scores:   Nutrition Goals Re-Evaluation:   Nutrition Goals Discharge (Final Nutrition Goals  Re-Evaluation):   Psychosocial: Target Goals: Acknowledge presence or absence of significant depression and/or stress, maximize coping skills, provide positive support system. Participant is able to verbalize types and ability to use techniques and skills needed for reducing stress and depression.   Initial Review & Psychosocial Screening:     Initial Psych Review & Screening - 04/14/16 1556      Family Dynamics   Good Support System? Yes   Comments Mr SmiGrunders good support from his wife and children. 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 Also Mr Ferrentino and his wife love to dance, but he is unable to dance at the level she has known before his shortness of breath. Mr Krantz is looking forward to improving his stamina though his participation in [place].     Barriers   Psychosocial barriers to participate in program There are no identifiable barriers or psychosocial needs.;The patient should benefit from training in stress management and relaxation.     Screening Interventions   Interventions Encouraged to exercise;Program counselor consult      Quality of Life Scores:     Quality of Life - 04/14/16 1603      Quality of Life Scores   Health/Function Pre 20.81 %   Socioeconomic Pre 19.75 %   Psych/Spiritual Pre 20.86 %   Family Pre 21 %   GLOBAL Pre 20.61 %      PHQ-9: Recent Review Flowsheet Data    Depression screen [facility] 2/9 04/14/2016 01/29/2015 10/17/2014   Decreased Interest 1 1 1    Down, Depressed, Hopeless 1 0 0   PHQ - 2 Score 2 1 1    Altered sleeping 0 - -   Tired, decreased energy 2 - -   Change in appetite 0 - -   Feeling bad or failure about yourself  2 - -   Trouble concentrating 0 - -   Moving slowly or fidgety/restless 0 - -   Suicidal thoughts 0 - -   PHQ-9 Score 6 - -   Difficult doing work/chores Somewhat difficult - -     Interpretation of Total Score  Total Score  Depression Severity:  1-4 = Minimal depression, 5-9 = Mild depression, 10-14 = Moderate depression, 15-19 = Moderately severe depression, 20-27 = Severe depression   Psychosocial Evaluation and Intervention:     Psychosocial Evaluation - 04/28/16 1208      Psychosocial Evaluation & Interventions   Comments Mr Kallman Raffield) has returned to this program subsequent to a lung infection several months ago.  Counselor met with him for an initial psychosocial evaluation.  He will be 61 years old in May and continues to have a strong support system with a spouse and his son who lives locally.  Beckem has multiple health issues and is on medical disability as a result.  He sleeps approximately 6 hours each night with some interruption due to bathroom breaks.  He denies a history of depression or anxiety or current symptoms - although when he was recently sick he was more "down," which is to be expected.  Deuce states he is generally in a positive mood and other than his health he has minimal stress in his life currently.  He has goals to breathe better; increase his stamina and strength; and lose more weight - although he proudly announced he has already lost 5 pounds over the past two weeks.  Counselor and staff will be following with Romin throughout the course of this program.     Expected Outcomes Shmiel will exercise consistently to achieve his stated goals.  He will also meet with the dietician to help with his weight loss goals.     Continue Psychosocial Services  Follow up required by staff      Psychosocial Re-Evaluation:   Psychosocial Discharge (Final Psychosocial Re-Evaluation):   Education: Education Goals: Education classes will be provided on a weekly basis, covering required topics. Participant will state understanding/return demonstration of topics presented.  Learning Barriers/Preferences:  Learning Barriers/Preferences - 04/14/16 1546      Learning Barriers/Preferences   Learning  Barriers None   Learning Preferences None      Education Topics: Initial Evaluation Education: - Verbal, written and demonstration of respiratory meds, RPE/PD scales, oximetry and breathing techniques. Instruction on use of nebulizers and MDIs: cleaning and proper use, rinsing mouth with steroid doses and importance of monitoring MDI activations.   Pulmonary Rehab from 05/07/2016 in [facility] Cardiac and Pulmonary Rehab  Date  04/14/16  Educator  LB  Instruction Review Code  2- meets goals/outcomes      General Nutrition Guidelines/Fats and Fiber: -Group instruction provided by verbal, written material, models and posters to present the general guidelines for heart healthy nutrition. Gives an explanation and review of dietary fats and fiber.   Pulmonary Rehab from 05/07/2016 in [facility] Cardiac and Pulmonary Rehab  Date  05/05/16  Educator  CR  Instruction Review Code  2- meets goals/outcomes      Controlling Sodium/Reading Food Labels: -Group verbal and written material supporting the discussion of sodium use in heart healthy nutrition. Review and explanation with models, verbal and written materials for utilization of the food label.   Pulmonary Rehab from 02/05/2015 in [facility] Cardiac and Pulmonary Rehab  Date  11/17/14  Educator  Candiss Norse   Instruction Review Code  2- meets goals/outcomes      Exercise Physiology & Risk Factors: - Group verbal and written instruction with models to review the exercise physiology of the cardiovascular system and associated critical values. Details cardiovascular disease risk factors and the goals associated with each risk factor.   Aerobic Exercise & Resistance Training: - Gives group verbal and written discussion on the health impact of inactivity. On the components of aerobic and resistive training programs and the benefits of this training and how to safely progress through these programs.   Pulmonary Rehab from 02/05/2015 in [facility] Cardiac and  Pulmonary Rehab  Date  01/31/15  Educator  SW  Instruction Review Code  2- meets goals/outcomes      Flexibility, Balance, General Exercise Guidelines: - Provides group verbal and written instruction on the benefits of flexibility and balance training programs. Provides general exercise guidelines with specific guidelines to those with heart or lung disease. Demonstration and skill practice provided.   Pulmonary [facility]05/[facility] Card[facility] and Pulmonary Rehab  Date  11/15/14  Educator  SW  Instruction Review Code  2- meets goals/outcomes      Stress Management: - Provides group verbal and written instruction about the health risks of elevated stress, cause of high stress, and healthy ways to reduce stress.   Pulmonary Rehab from 02/05/2015 in [facility] Cardiac and Pulmonary Rehab  Date  11/08/14  Educator  [facility]  Instruction Review Code  2- meets goals/outcomes      Depression: - Provides group verbal and written instruction on the correlation between heart/lung disease and depressed mood, treatment options, and the stigmas associated with seeking treatment.   Pulmonary Rehab from 02/05/2015 in [facility] Cardiac and Pulmonary Rehab  Date  01/17/15 Marcelina Morel attended 01/03/15]  Educator  [facility]  Instruction Review Code  2- meets goals/outcomes      Exercise & Equipment Safety: - Individual verbal instruction and demonstration of equipment use and safety with use of the equipment.   Pulmonary Rehab from 05/07/2016 in [facility] Cardiac and Pulmonary Rehab  Date  04/21/16  Educator  AS  Instruction Review Code  2- meets goals/outcomes  Infection Prevention: - Provides verbal and written material to individual with discussion of infection control including proper hand washing and proper equipment cleaning during exercise session.   Pulmonary Rehab from 05/07/2016 in [facility] Cardiac and Pulmonary Rehab  Date  04/21/16  Educator  AS  Instruction Review Code  2- meets goals/outcomes      Falls  Prevention: - Provides verbal and written material to individual with discussion of falls prevention and safety.   Pulmonary Rehab from 02/05/2015 in [facility] Cardiac and Pulmonary Rehab  Date  10/17/14  Educator  LB  Instruction Review Code  2- meets goals/outcomes      Diabetes: - Individual verbal and written instruction to review signs/symptoms of diabetes, desired ranges of glucose level fasting, after meals and with exercise. Advice that pre and post exercise glucose checks will be done for 3 sessions at entry of program.   Chronic Lung Diseases: - Group verbal and written instruction to review new updates, new respiratory medications, new advancements in procedures and treatments. Provide informative websites and "800" numbers of self-education.   Pulmonary Rehab from 02/05/2015 in [facility] Cardiac and Pulmonary Rehab  Date  12/15/14 [facility] Your Numbers]  Educator  CE  Instruction Review Code  2- meets goals/outcomes      Lung Procedures: - Group verbal and written instruction to describe testing methods done to diagnose lung disease. Review the outcome of test results. Describe the treatment choices: Pulmonary Function Tests, ABGs and oximetry.   Pulmonary Rehab from 02/05/2015 in [facility] Cardiac and Pulmonary Rehab  Date  11/10/14  Educator  [place]  Instruction Review Code  2- meets goals/outcomes      Energy Conservation: - Provide group verbal and written instruction for methods to conserve energy, plan and organize activities. Instruct on pacing techniques, use of adaptive equipment and posture/positioning to relieve shortness of breath.   Pulmonary Rehab from 02/05/2015 in [facility] Cardiac and Pulmonary Rehab  Date  11/22/14  Educator  SW  Instruction Review Code  2- meets goals/outcomes      Triggers: - Group verbal and written instruction to review types of environmental controls: home humidity, furnaces, filters, dust mite/pet prevention, HEPA vacuums. To discuss weather changes, air  quality and the benefits of nasal washing.   Pulmonary Rehab from 05/07/2016 in [facility] Cardiac and Pulmonary Rehab  Date  04/30/16  Educator  LB  Instruction Review Code  2- meets goals/outcomes      Exacerbations: - Group verbal and written instruction to provide: warning signs, infection symptoms, calling MD promptly, preventive modes, and value of vaccinations. Review: effective airway clearance, coughing and/or vibration techniques. Create an Sports administrator.   Pulmonary Rehab from 05/07/2016 in [facility] Cardiac and Pulmonary Rehab  Date  04/23/16  Educator  LV  Instruction Review Code  2- meets goals/outcomes      Oxygen: - Individual and group verbal and written instruction on oxygen therapy. Includes supplement oxygen, available portable oxygen systems, continuous and intermittent flow rates, oxygen safety, concentrators, and Medicare reimbursement for oxygen.   Respiratory Medications: - Group verbal and written instruction to review medications for lung disease. Drug class, frequency, complications, importance of spacers, rinsing mouth after steroid MDI's, and proper cleaning methods for nebulizers.   Pulmonary Rehab from 02/05/2015 in [facility] Cardiac and Pulmonary Rehab  Date  10/17/14  Educator  [place]  Instruction Review Code  2- meets goals/outcomes      AED/CPR: - Group verbal and written instruction with the use of models to demonstrate the  basic use of the AED with the basic ABC's of resuscitation.   Pulmonary Rehab from 05/07/2016 in [facility] Cardiac and Pulmonary Rehab  Date  05/02/16  Educator  [place]  Instruction Review Code  2- meets goals/outcomes      Breathing Retraining: - Provides individuals verbal and written instruction on purpose, frequency, and proper technique of diaphragmatic breathing and pursed-lipped breathing. Applies individual practice skills.   Pulmonary Rehab from 02/05/2015 in [facility] Cardiac and Pulmonary Rehab  Date  10/20/14  Educator  [place]  Instruction Review Code   2- meets goals/outcomes      Anatomy and Physiology of the Lungs: - Group verbal and written instruction with the use of models to provide basic lung anatomy and physiology related to function, structure and complications of lung disease.   Heart Failure: - Group verbal and written instruction on the basics of heart failure: signs/symptoms, treatments, explanation of ejection fraction, enlarged heart and cardiomyopathy.   Pulmonary Rehab from 02/05/2015 in [facility] Cardiac and Pulmonary Rehab  Date  01/26/15  Educator  [place]  Instruction Review Code  2- meets goals/outcomes      Sleep Apnea: - Individual verbal and written instruction to review Obstructive Sleep Apnea. Review of risk factors, methods for diagnosing and types of masks and machines for OSA.   Anxiety: - Provides group, verbal and written instruction on the correlation between heart/lung disease and anxiety, treatment options, and management of anxiety.   Relaxation: - Provides group, verbal and written instruction about the benefits of relaxation for patients with heart/lung disease. Also provides patients with examples of relaxation techniques.   Pulmonary Rehab from 05/07/2016 in [facility] Cardiac and Pulmonary Rehab  Date  05/07/16  Educator  [facility]  Instruction Review Code  2- Meets goals/outcomes      Knowledge Questionnaire Score:     Knowledge Questionnaire Score - 04/14/16 1546      Knowledge Questionnaire Score   Pre Score 10/10       Core Components/Risk Factors/Patient Goals at Admission:     Personal Goals and Risk Factors at Admission - 04/14/16 1551      Core Components/Risk Factors/Patient Goals on Admission    Weight Management Yes   Intervention Weight Management: Develop a combined nutrition and exercise program designed to reach desired caloric intake, while maintaining appropriate intake of nutrient and fiber, sodium and fats, and appropriate energy expenditure required for the weight goal.;Weight  Management: Provide education and appropriate resources to help participant work on and attain dietary goals.   Admit Weight 238 lb 3.2 oz (108 kg)   Goal Weight: Short Term 233 lb (105.7 kg)   Goal Weight: Long Term 210 lb (95.3 kg)   Expected Outcomes Short Term: Continue to assess and modify interventions until short term weight is achieved;Long Term: Adherence to nutrition and physical activity/exercise program aimed toward attainment of established weight goal;Weight Loss: Understanding of general recommendations for a balanced deficit meal plan, which promotes 1-2 lb weight loss per week and includes a negative energy balance of [phone] kcal/d;Understanding of distribution of calorie intake throughout the day with the consumption of 4-5 meals/snacks;Understanding recommendations for meals to include 15-35% energy as protein, 25-35% energy from fat, 35-60% energy from carbohydrates, less than 210m of dietary cholesterol, 20-35 gm of total fiber daily   Tobacco Cessation Yes   Number of packs per day Mr Bamba's quit date was 07/2015. Occasionally he has a cigarette, but he is now nauseated when he smokes one.   Improve  shortness of breath with ADL's Yes   Intervention Provide education, individualized exercise plan and daily activity instruction to help decrease symptoms of SOB with activities of daily living.   Expected Outcomes Short Term: Achieves a reduction of symptoms when performing activities of daily living.   Develop more efficient breathing techniques such as purse lipped breathing and diaphragmatic breathing; and practicing self-pacing with activity Yes   Intervention Provide education, demonstration and support about specific breathing techniuqes utilized for more efficient breathing. Include techniques such as pursed lipped breathing, diaphragmatic breathing and self-pacing activity.   Expected Outcomes Short Term: Participant will be able to demonstrate and use breathing techniques as  needed throughout daily activities.   Increase knowledge of respiratory medications and ability to use respiratory devices properly  Yes  Spiriva, Symbicort, albuterol MDI - uses spacer.   Intervention Provide education and demonstration as needed of appropriate use of medications, inhalers, and oxygen therapy.   Expected Outcomes Short Term: Achieves understanding of medications use. Understands that oxygen is a medication prescribed by physician. Demonstrates appropriate use of inhaler and oxygen therapy.   Hypertension Yes   Intervention Provide education on lifestyle modifcations including regular physical activity/exercise, weight management, moderate sodium restriction and increased consumption of fresh fruit, vegetables, and low fat dairy, alcohol moderation, and smoking cessation.;Monitor prescription use compliance.   Expected Outcomes Short Term: Continued assessment and intervention until BP is < 140/51m HG in hypertensive participants. < 130/853mHG in hypertensive participants with diabetes, heart failure or chronic kidney disease.;Long Term: Maintenance of blood pressure at goal levels.   Lipids Yes   Intervention Provide education and support for participant on nutrition & aerobic/resistive exercise along with prescribed medications to achieve LDL <7035mHDL >98m64m Expected Outcomes Short Term: Participant states understanding of desired cholesterol values and is compliant with medications prescribed. Participant is following exercise prescription and nutrition guidelines.;Long Term: Cholesterol controlled with medications as prescribed, with individualized exercise RX and with personalized nutrition plan. Value goals: LDL < 70mg24mL > 40 mg.      Core Components/Risk Factors/Patient Goals Review:      Goals and Risk Factor Review    Row Name 04/29/16 0642 [phone]        Core Components/Risk Factors/Patient Goals Review   Personal Goals Review Weight  Management/Obesity;Improve shortness of breath with ADL's;Increase knowledge of respiratory medications and ability to use respiratory devices properly.;Develop more efficient breathing techniques such as purse lipped breathing and diaphragmatic breathing and practicing self-pacing with activity.;Tobacco Cessation;Lipids       Review Mr SmithWilkiecompleted 4 sessions of [facility]. He continues to use PLB with good technique and continues to pace with his exercise and activities. His smoking cessation is going well - no cigarrettes for several weeks. He has loss 5lbs since starting the program and is working on a healthier diet. Mr SmithWaina good understanding of his MDI's. For now, he is trying a new inhaler, Stiolta, instead of his Symbicort. He seems to notice an improved difference with this inhaler. His  exercise goal is to dance 3mins8m shag with his wife with some modification to the dance. Mr Eshbach rodriguempliant with his lipid medication.       Expected Outcomes Continue progressing with his exercise goals and maintaining total smoking cessation.          Core Components/Risk Factors/Patient Goals at Discharge (Final Review):      Goals and Risk Factor Review -  04/29/16 3748      Core Components/Risk Factors/Patient Goals Review   Personal Goals Review Weight Management/Obesity;Improve shortness of breath with ADL's;Increase knowledge of respiratory medications and ability to use respiratory devices properly.;Develop more efficient breathing techniques such as purse lipped breathing and diaphragmatic breathing and practicing self-pacing with activity.;Tobacco Cessation;Lipids   Review Mr Knierim has completed 4 sessions of [facility]. He continues to use PLB with good technique and continues to pace with his exercise and activities. His smoking cessation is going well - no cigarrettes for several weeks. He has loss 5lbs since starting the program and is working on a healthier diet. Mr Thetford has a  good understanding of his MDI's. For now, he is trying a new inhaler, Stiolta, instead of his Symbicort. He seems to notice an improved difference with this inhaler. His  exercise goal is to dance 17mns of shag with his wife with some modification to the dance. Mr scogswellis compliant with his lipid medication.   Expected Outcomes Continue progressing with his exercise goals and maintaining total smoking cessation.      ITP Comments:     ITP Comments    Row Name 04/25/16 1238 05/12/16 0912         ITP Comments Know Your Numbers 04/25/16 CE 30 day note review with Dr MEmily Filbert Medical Director of [facility]        Comments: 30 day note review [facility] Dr [facility]

## 2016-05-16 ENCOUNTER — Encounter: Payer: Medicaid Other | Admitting: *Deleted

## 2016-05-16 DIAGNOSIS — J449 Chronic obstructive pulmonary disease, unspecified: Secondary | ICD-10-CM

## 2016-05-16 NOTE — Progress Notes (Signed)
Daily Session Note  Patient Details  Name: Nathaniel [name] MRN: [id number] Date of Birth: 1955-11-20 Referring Provider:     Pulmonary Rehab from 04/14/2016 in [facility] Cardiac and Pulmonary Rehab  Referring Provider  Jeronimo Greaves MD      Encounter Date: 05/16/2016  Check In:     Session Check In - 05/16/16 1308      Check-In   Location [facility]-Cardiac & Pulmonary Rehab   Staff Present Alberteen Sam, MA, ACSM RCEP, Exercise Physiologist;Zeshan Sena Frederico Hamman, RN BSN   Supervising physician immediately available to respond to emergencies [facility] immediately available ER MD   Physician(s) Drs. Jacqualine Code and Veronese   Medication changes reported     No   Fall or balance concerns reported    No   Tobacco Cessation No Change   Warm-up and Cool-down Performed as group-led Location manager Performed Yes   VAD Patient? No         History  Smoking Status  . Former Smoker  . Years: 45.00  . Types: Cigarettes  . Quit date: 07/2015  Smokeless Tobacco  . Never Used    Comment: no cigarettes currently    Goals Met:  Proper associated with RPD/PD & O2 Sat Independence with exercise equipment Using PLB without cueing & demonstrates good technique Exercise tolerated well Strength training completed today  Goals Unmet:  Not Applicable  Comments: Pt able to follow exercise prescription today without complaint.  Will continue to monitor for progression.    Dr. Emily Filbert is Medical Director for [place] and [facility].

## 2016-05-19 DIAGNOSIS — J449 Chronic obstructive pulmonary disease, unspecified: Secondary | ICD-10-CM | POA: Diagnosis not present

## 2016-05-19 NOTE — Progress Notes (Signed)
Daily Session Note  Patient Details  Name: Nathaniel [name] MRN: [id number] Date of Birth: August 28, 1955 Referring Provider:     Pulmonary Rehab from 04/14/2016 in [facility] Cardiac and Pulmonary Rehab  Referring Provider  Jeronimo Greaves MD      Encounter Date: 05/19/2016  Check In:     Session Check In - 05/19/16 1536      Check-In   Location [facility]-Cardiac & Pulmonary Rehab   Staff Present Carson Myrtle, BS, RRT, Respiratory Dareen Piano, BA, ACSM CEP, Exercise Physiologist;Kelly Amedeo Plenty, BS, ACSM CEP, Exercise Physiologist   Supervising physician immediately available to respond to emergencies [facility] immediately available ER MD   Medication changes reported     No   Fall or balance concerns reported    No   Warm-up and Cool-down Performed as group-led instruction   Resistance Training Performed Yes   VAD Patient? No     Pain Assessment   Currently in Pain? No/denies         History  Smoking Status  . Former Smoker  . Years: 45.00  . Types: Cigarettes  . Quit date: 07/2015  Smokeless Tobacco  . Never Used    Comment: no cigarettes currently    Goals Met:  Proper associated with RPD/PD & O2 Sat Independence with exercise equipment Exercise tolerated well Strength training completed today  Goals Unmet:  Not Applicable  Comments: Pt able to follow exercise prescription today without complaint.  Will continue to monitor for progression.    Dr. Emily Filbert is Medical Director for [place] and [facility].

## 2016-05-23 ENCOUNTER — Encounter: Payer: Medicaid Other | Admitting: *Deleted

## 2016-05-23 DIAGNOSIS — J449 Chronic obstructive pulmonary disease, unspecified: Secondary | ICD-10-CM

## 2016-05-23 NOTE — Progress Notes (Signed)
Daily Session Note  Patient Details  Name: Nathaniel [name] MRN: [id number] Date of Birth: 1955/08/01 Referring Provider:     Pulmonary Rehab from 04/14/2016 in [facility] Cardiac and Pulmonary Rehab  Referring Provider  Jeronimo Greaves MD      Encounter Date: 05/23/2016  Check In:     Session Check In - 05/23/16 1133      Check-In   Location [facility]-Cardiac & Pulmonary Rehab   Staff Present Renita Papa, RN BSN;Carroll Enterkin, RN, Levie Heritage, MA, ACSM RCEP, Exercise Physiologist;Krista Frederico Hamman, RN BSN   Supervising physician immediately available to respond to emergencies [facility] immediately available ER MD   Physician(s) Dr. Reita Cliche and Clearnce Hasten   Medication changes reported     No   Fall or balance concerns reported    No   Warm-up and Cool-down Performed as group-led instruction   Resistance Training Performed Yes   VAD Patient? No     Pain Assessment   Currently in Pain? No/denies         History  Smoking Status  . Former Smoker  . Years: 45.00  . Types: Cigarettes  . Quit date: 07/2015  Smokeless Tobacco  . Never Used    Comment: no cigarettes currently    Goals Met:  Proper associated with RPD/PD & O2 Sat Independence with exercise equipment Using PLB without cueing & demonstrates good technique Exercise tolerated well Personal goals reviewed Strength training completed today  Goals Unmet:  Not Applicable  Comments: Pt able to follow exercise prescription today without complaint.  Will continue to monitor for progression.    Dr. Emily Filbert is Medical Director for [place] and [facility].

## 2016-05-23 NOTE — Progress Notes (Signed)
Nathaniel [name] 61 y.o. male  30 day Psychosocial Note  Patient psychosocial assessment reveals no barriers to participation in Pulmonary Rehab. Psychosocial areas that are currently affecting patient's rehab experience include concerns about breathing and SOB. Patient does continue to exhibit positive coping skills to deal with illness and psychosocial concerns. Offered emotional support and reassurance. Patient does feel he is making progress toward Pulmonary Rehab goals. Patient reports his health and activity level has improved in the past 30 days as evidenced by patient's report of increased ability to do work around his house. Patient states family/friends have noticed changes in his activity or mood. Patient reports feeling positive about current and projected progression in Pulmonary Rehab although he still struggles with SOB it is better. After reviewing the patient's treatment plan, the patient is making progress toward Pulmonary Rehab goals. Patient's rate of progress toward rehab goals is good. Plan of action to help patient continue to work towards rehab goals include continued smoking cessation, continued dietary changes and limiting salt and sugar intake, bieng compliant with medications and treatment plans to improve/maintain health and SOB. Will continue to monitor and evaluate progress toward psychosocial goal(s).  Goal(s) in progress: Improved management of stress and anxiety Improved coping skills Help patient work toward returning to meaningful activities that improve patient's QOL and are attainable with patient's lung disease

## 2016-05-26 DIAGNOSIS — J449 Chronic obstructive pulmonary disease, unspecified: Secondary | ICD-10-CM

## 2016-05-26 NOTE — Progress Notes (Signed)
Daily Session Note  Patient Details  Name: Temple Sporer MRN: [id number] Date of Birth: 26-May-1955 Referring Provider:     Pulmonary Rehab from 04/14/2016 in [facility] Cardiac and Pulmonary Rehab  Referring Provider  Jeronimo Greaves MD      Encounter Date: 05/26/2016  Check In:     Session Check In - 05/26/16 1336      Check-In   Location [facility]-Cardiac & Pulmonary Rehab   Staff Present Carson Myrtle, BS, RRT, Respiratory Dareen Piano, BA, ACSM CEP, Exercise Physiologist;Kelly Amedeo Plenty, BS, ACSM CEP, Exercise Physiologist   Supervising physician immediately available to respond to emergencies [facility] immediately available ER MD   Physician(s) Jimmye Norman and Corky Downs   Medication changes reported     No   Fall or balance concerns reported    No   Warm-up and Cool-down Performed as group-led Location manager Performed Yes   VAD Patient? No     Pain Assessment   Currently in Pain? No/denies         History  Smoking Status  . Former Smoker  . Years: 45.00  . Types: Cigarettes  . Quit date: 07/2015  Smokeless Tobacco  . Never Used    Comment: no cigarettes currently    Goals Met:  Proper associated with RPD/PD & O2 Sat Independence with exercise equipment Exercise tolerated well Strength training completed today  Goals Unmet:  Not Applicable  Comments: Pt able to follow exercise prescription today without complaint.  Will continue to monitor for progression.    Dr. Emily Filbert is Medical Director for [place] and [facility].

## 2016-05-28 ENCOUNTER — Encounter: Payer: Medicaid Other | Admitting: *Deleted

## 2016-05-28 DIAGNOSIS — J449 Chronic obstructive pulmonary disease, unspecified: Secondary | ICD-10-CM

## 2016-05-28 NOTE — Progress Notes (Signed)
Daily Session Note  Patient Details  Name: Nathaniel [name] MRN: [id number] Date of Birth: [birth year] Referring Provider:     Pulmonary Rehab from 04/14/2016 in [facility] Cardiac and Pulmonary Rehab  Referring Provider  Jeronimo Greaves MD      Encounter Date: 05/28/2016  Check In:     Session Check In - 05/28/16 1241      Check-In   Location [facility]-Cardiac & Pulmonary Rehab   Staff Present Carson Myrtle, BS, RRT, Respiratory Therapist;Maureena Dabbs Sherryll Burger, RN BSN;Jessica Luan Pulling, MA, ACSM RCEP, Exercise Physiologist   Supervising physician immediately available to respond to emergencies [facility] immediately available ER MD   Physician(s) Dr. Joni Fears and Jimmye Norman    Medication changes reported     No   Fall or balance concerns reported    No   Warm-up and Cool-down Performed as group-led instruction   Resistance Training Performed Yes   VAD Patient? No     Pain Assessment   Currently in Pain? No/denies           Exercise Prescription Changes - 05/27/16 1500      Response to Exercise   Blood Pressure (Admit) 110/64   Blood Pressure (Exercise) 134/80   Blood Pressure (Exit) 118/70   Heart Rate (Admit) 96 bpm   Heart Rate (Exercise) 116 bpm   Heart Rate (Exit) 64 bpm   Oxygen Saturation (Admit) 96 %   Oxygen Saturation (Exercise) 93 %   Oxygen Saturation (Exit) 94 %   Rating of Perceived Exertion (Exercise) 13   Perceived Dyspnea (Exercise) 2   Symptoms SOB   Duration Continue with 45 min of aerobic exercise without signs/symptoms of physical distress.   Intensity THRR unchanged     Progression   Progression Continue to progress workloads to maintain intensity without signs/symptoms of physical distress.   Average METs 2.96     Resistance Training   Training Prescription Yes   Weight 4 lbs   Reps 10-15     Interval Training   Interval Training No     Treadmill   MPH 2.5   Grade 0   Minutes 15   METs 2.91     Elliptical   Level 3   Minutes 15     T5 Nustep   Level 5   SPM 68   Minutes 15   METs 3     Home Exercise Plan   Plans to continue exercise at [employer] (comment)  [employer]   Frequency Add 1 additional day to program exercise sessions.   Initial Home Exercises Provided 05/02/16      History  Smoking Status  . Former Smoker  . Years: 45.00  . Types: Cigarettes  . Quit date: 07/2015  Smokeless Tobacco  . Never Used    Comment: no cigarettes currently    Goals Met:  Proper associated with RPD/PD & O2 Sat Independence with exercise equipment Using PLB without cueing & demonstrates good technique Exercise tolerated well Strength training completed today  Goals Unmet:  Not Applicable  Comments: Pt able to follow exercise prescription today without complaint.  Will continue to monitor for progression.    Dr. Emily Filbert is Medical Director for [place] and [facility].

## 2016-05-30 ENCOUNTER — Encounter: Payer: Medicaid Other | Admitting: *Deleted

## 2016-05-30 DIAGNOSIS — J449 Chronic obstructive pulmonary disease, unspecified: Secondary | ICD-10-CM | POA: Diagnosis not present

## 2016-05-30 NOTE — Progress Notes (Signed)
Daily Session Note  Patient Details  Name: Hillard Goodwine MRN: [id number] Date of Birth: 08-Dec-1955 Referring Provider:     Pulmonary Rehab from 04/14/2016 in [facility] Cardiac and Pulmonary Rehab  Referring Provider  Jeronimo Greaves MD      Encounter Date: 05/30/2016  Check In:     Session Check In - 05/30/16 1126      Check-In   Location [facility]-Cardiac & Pulmonary Rehab   Staff Present Alberteen Sam, MA, ACSM RCEP, Exercise Physiologist;Krista Frederico Hamman, RN BSN;Meredith Sherryll Burger, RN BSN   Supervising physician immediately available to respond to emergencies [facility] immediately available ER MD   Physician(s) Dr. Burlene Arnt and Jacqualine Code   Medication changes reported     No   Fall or balance concerns reported    No   Warm-up and Cool-down Performed as group-led instruction   Resistance Training Performed Yes   VAD Patient? No     Pain Assessment   Currently in Pain? No/denies   Multiple Pain Sites No         History  Smoking Status  . Former Smoker  . Years: 45.00  . Types: Cigarettes  . Quit date: 07/2015  Smokeless Tobacco  . Never Used    Comment: no cigarettes currently    Goals Met:  Proper associated with RPD/PD & O2 Sat Independence with exercise equipment Using PLB without cueing & demonstrates good technique Exercise tolerated well Strength training completed today  Goals Unmet:  Not Applicable  Comments: Pt able to follow exercise prescription today without complaint.  Will continue to monitor for progression.    Dr. Emily Filbert is Medical Director for [place] and [facility].

## 2016-06-04 ENCOUNTER — Encounter: Payer: Medicaid Other | Admitting: *Deleted

## 2016-06-04 DIAGNOSIS — J449 Chronic obstructive pulmonary disease, unspecified: Secondary | ICD-10-CM

## 2016-06-04 NOTE — Progress Notes (Addendum)
Daily Session Note  Patient Details  Name: Nathaniel [name] MRN: [id number] Date of Birth: 1955-08-11 Referring Provider:     Pulmonary Rehab from 04/14/2016 in [facility] Cardiac and Pul[facility] Rehab  Referring Provider  Jeronimo Greaves MD      Encounter Date: 06/04/2016  Check In:     Session Check In - 06/04/16 1129      Check-In   Location [facility]-Cardiac & Pulmonary Rehab   Staff Present Alberteen Sam, MA, ACSM RCEP, Exercise Physiologist;Laureen Janell Quiet, RRT, Respiratory Therapist;Meredith Sherryll Burger, RN BSN   Supervising physician immediately available to respond to emergencies [facility] immediately available ER MD   Physician(s) Drs. Paduchowski and Williams   Medication changes reported     No   Fall or balance concerns reported    No   Warm-up and Cool-down Performed as group-led Location manager Performed Yes   VAD Patient? No     Pain Assessment   Currently in Pain? No/denies   Multiple Pain Sites No         History  Smoking Status  . Former Smoker  . Years: 45.00  . Types: Cigarettes  . Quit date: 07/2015  Smokeless Tobacco  . Never Used    Comment: no cigarettes currently    Goals Met:  Proper associated with RPD/PD & O2 Sat Independence with exercise equipment Using PLB without cueing & demonstrates good technique Exercise tolerated well Strength training completed today  Goals Unmet:  Not Applicable  Comments: Pt able to follow exercise prescription today without complaint.  Will continue to monitor for progression.     6 Minute Walk    Row Name 04/14/16 1600 06/04/16 1239       6 Minute Walk   Phase Initial Mid Program    Distance 1000 feet 1480 feet    Distance % Change  - 48 %  480 ft    Walk Time 6 minutes 56 minutes    # of Rest Breaks 0 0    MPH 1.89 2.8    METS 2.95 3.82    RPE 11 12    Perceived Dyspnea  2 2    VO2 Peak 10.33 13.38    Symptoms Yes (comment) No    Comments left knee pain 2/10  -    Resting HR 101 bpm  84 bpm    Resting BP 126/64 124/70    Max Ex. HR 110 bpm 108 bpm    Max Ex. BP 136/70 146/74    2 Minute Post BP 136/66 126/70      Interval HR   Baseline HR 101 84    1 Minute HR 107 101    2 Minute HR 105 107    3 Minute HR 106 108    4 Minute HR 106 107    5 Minute HR 110 108    6 Minute HR 106 107    2 Minute Post HR 95 98    Interval Heart Rate? Yes Yes      Interval Oxygen   Interval Oxygen? Yes Yes    Baseline Oxygen Saturation % 96 % 98 %    Baseline Liters of Oxygen 0 L  Room Air 0 L  Room Air    1 Minute Oxygen Saturation % 96 % 97 %    1 Minute Liters of Oxygen 0 L 0 L    2 Minute Oxygen Saturation % 96 % 96 %    2 Minute Liters of Oxygen  0 L 0 L    3 Minute Oxygen Saturation % 97 % 97 %    3 Minute Liters of Oxygen 0 L 0 L    4 Minute Oxygen Saturation % 96 % 96 %    4 Minute Liters of Oxygen 0 L 0 L    5 Minute Oxygen Saturation % 97 % 96 %    5 Minute Liters of Oxygen 0 L 0 L    6 Minute Oxygen Saturation % 96 % 96 %    6 Minute Liters of Oxygen 0 L 0 L    2 Minute Post Oxygen Saturation % 97 % 98 %    2 Minute Post Liters of Oxygen 0 L 0 L         Dr. Emily Filbert is Medical Director for [place] and [facility].

## 2016-06-06 ENCOUNTER — Encounter: Payer: Medicaid Other | Attending: Internal Medicine | Admitting: *Deleted

## 2016-06-06 DIAGNOSIS — J449 Chronic obstructive pulmonary disease, unspecified: Secondary | ICD-10-CM | POA: Insufficient documentation

## 2016-06-06 NOTE — Progress Notes (Signed)
Daily Session Note  Patient Details  Name: Nathaniel [name] MRN: [id number] Date of Birth: 19-Jun-1955 Referring Provider:     Pulmonary Rehab from 04/14/2016 in [facility] Cardiac and Pulmonary Rehab  Referring Provider  Jeronimo Greaves MD      Encounter Date: 06/06/2016  Check In:     Session Check In - 06/06/16 1136      Check-In   Location [facility]-Cardiac & Pulmonary Rehab   Staff Present Heath Lark, RN, BSN, CCRP;Meredith Sherryll Burger, RN Vickki Hearing, BA, ACSM CEP, Exercise Physiologist;Jessica [place], MA, ACSM RCEP, Exercise Physiologist   Supervising physician immediately available to respond to emergencies [facility] immediately available ER MD   Physician(s) Dr. Burlene Arnt and Alfred Levins    Medication changes reported     No   Fall or balance concerns reported    No   Tobacco Cessation No Change   Warm-up and Cool-down Performed as group-led instruction   Resistance Training Performed Yes   VAD Patient? No     Pain Assessment   Currently in Pain? No/denies         History  Smoking Status  . Former Smoker  . Years: 45.00  . Types: Cigarettes  . Quit date: 07/2015  Smokeless Tobacco  . Never Used    Comment: no cigarettes currently    Goals Met:  Proper associated with RPD/PD & O2 Sat Independence with exercise equipment Using PLB without cueing & demonstrates good technique Exercise tolerated well Strength training completed today  Goals Unmet:  Not Applicable  Comments: Pt able to follow exercise prescription today without complaint.  Will continue to monitor for progression.    Dr. Emily Filbert is Medical Director for [place] and [facility].

## 2016-06-09 ENCOUNTER — Encounter: Payer: Self-pay | Admitting: Respiratory Therapy

## 2016-06-09 DIAGNOSIS — J449 Chronic obstructive pulmonary disease, unspecified: Secondary | ICD-10-CM | POA: Diagnosis not present

## 2016-06-09 NOTE — Progress Notes (Signed)
Pulmonary Individual Treatment Plan  Patient Details  Name: Nathaniel [name] MRN: [id number] Date of Birth: [birth year] Referring Provider:     Pulmonary Rehab from 04/14/2016 in [facility] Cardiac and Pulmonary Rehab  Referring Provider  Jeronimo Greaves MD      Initial Encounter Date:    Pulmonary Rehab from 04/14/2016 in [facility] Cardiac and Pulmonary Rehab  Date  04/14/16  Referring Provider  Mock, Corene Cornea MD      Visit Diagnosis: COPD, moderate ([place])  Patient's Home Medications on Admission:  Current Outpatient Prescriptions:    albuterol (PROVENTIL HFA;VENTOLIN HFA) 108 (90 BASE) MCG/ACT inhaler, Inhale 2 puffs into the lungs every 6 (six) hours as needed for wheezing or shortness of breath., Disp: , Rfl:    albuterol (PROVENTIL HFA;VENTOLIN HFA) 108 (90 BASE) MCG/ACT inhaler, Inhale into the lungs., Disp: , Rfl:    aspirin EC 81 MG tablet, Take 81 mg by mouth., Disp: , Rfl:    budesonide-formoterol (SYMBICORT) 160-4.5 MCG/ACT inhaler, Inhale into the lungs., Disp: , Rfl:    enalapril (VASOTEC) 10 MG tablet, Take 1 tablet by mouth at bedtime., Disp: , Rfl: 11   fluticasone (FLONASE) 50 MCG/ACT nasal spray, 1 spray by Each Nare route two (2) times a day as needed. Frequency:BID   Dosage:50   MCG  Instructions:  Note:Dose: 50MCG, Disp: , Rfl:    hydrocortisone cream 1 %, Apply 1 application topically 2 (two) times daily as needed., Disp: , Rfl:    levalbuterol (XOPENEX HFA) 45 MCG/ACT inhaler, Inhale into the lungs., Disp: , Rfl:    meloxicam (MOBIC) 7.5 MG tablet, Take 15 mg by mouth., Disp: , Rfl:    Na Sulfate-K Sulfate-Mg Sulf (SUPREP BOWEL PREP) SOLN, Take 1 kit by mouth as directed. (Patient not taking: Reported on 02/20/2016), Disp: 1 Bottle, Rfl: 0   oxyCODONE-acetaminophen (PERCOCET/ROXICET) 5-325 MG per tablet, Take 1 tablet by mouth every 6 (six) hours as needed., Disp: , Rfl: 0   pravastatin (PRAVACHOL) 10 MG tablet, Take 10 mg by mouth daily., Disp: , Rfl:    sildenafil  (VIAGRA) 100 MG tablet, Take 1 tablet by mouth as needed., Disp: , Rfl:    tiotropium (SPIRIVA HANDIHALER) 18 MCG inhalation capsule, Place into inhaler and inhale., Disp: , Rfl:   Past Medical History: Past Medical History:  Diagnosis Date   Arthritis    Chronic back pain    Dr. Quay Burow manages (per pt)   COPD (chronic obstructive pulmonary disease) ([place])    Hearing loss    Hyperlipidemia    Hypertension    MGUS (monoclonal gammopathy of unknown significance)    Multiple myeloma (HCC)    Proteinuria    Tobacco abuse     Tobacco Use: History  Smoking Status   Former Smoker   Years: 45.00   Types: Cigarettes   Quit date: 07/2015  Smokeless Tobacco   Never Used    Comment: no cigarettes currently    Labs: Recent Review Flowsheet Data    There is no flowsheet data to display.       ADL UCSD:     Pulmonary Assessment Scores    Row Name 04/14/16 1549 06/04/16 1238       ADL UCSD   ADL Phase Entry Mid    SOB Score total 51 60    Rest 1 1    Walk 2 3    Stairs 4 4    Bath 1 2    Dress 0 1    Shop  0 3      mMRC Score   mMRC Score 2 2       Pulmonary Function Assessment:     Pulmonary Function Assessment - 04/14/16 1546      Initial Spirometry Results   FVC% 72.7 %   FEV1% 37.2 %   FEV1/FVC Ratio 39     Post Bronchodilator Spirometry Results   FVC% 84.3 %   FEV1% 43.8 %   FEV1/FVC Ratio 40     Breath   Bilateral Breath Sounds Clear;Decreased   Shortness of Breath Yes;Limiting activity;Fear of Shortness of Breath      Exercise Target Goals:    Exercise Program Goal: Individual exercise prescription set with THRR, safety & activity barriers. Participant demonstrates ability to understand and report RPE using BORG scale, to self-measure pulse accurately, and to acknowledge the importance of the exercise prescription.  Exercise Prescription Goal: Starting with aerobic activity 30 plus minutes a day, 3 days per week for initial  exercise prescription. Provide home exercise prescription and guidelines that participant acknowledges understanding prior to discharge.  Activity Barriers & Risk Stratification:     Activity Barriers & Cardiac Risk Stratification - 04/14/16 1602      Activity Barriers & Cardiac Risk Stratification   Activity Barriers Deconditioning;Shortness of Breath;Joint Problems;Back Problems  back pain, left knee pain      6 Minute Walk:     6 Minute Walk    Row Name 04/14/16 1600 06/04/16 1239       6 Minute Walk   Phase Initial Mid Program    Distance 1000 feet 1480 feet    Distance % Change  -- 48 %  480 ft    Walk Time 6 minutes 56 minutes    # of Rest Breaks 0 0    MPH 1.89 2.8    METS 2.95 3.82    RPE 11 12    Perceived Dyspnea  2 2    VO2 Peak 10.33 13.38    Symptoms Yes (comment) No    Comments left knee pain 2/10  --    Resting HR 101 bpm 84 bpm    Resting BP 126/64 124/70    Max Ex. HR 110 bpm 108 bpm    Max Ex. BP 136/70 146/74    2 Minute Post BP 136/66 126/70      Interval HR   Baseline HR 101 84    1 Minute HR 107 101    2 Minute HR 105 107    3 Minute HR 106 108    4 Minute HR 106 107    5 Minute HR 110 108    6 Minute HR 106 107    2 Minute Post HR 95 98    Interval Heart Rate? Yes Yes      Interval Oxygen   Interval Oxygen? Yes Yes    Baseline Oxygen Saturation % 96 % 98 %    Baseline Liters of Oxygen 0 L  Room Air 0 L  Room Air    1 Minute Oxygen Saturation % 96 % 97 %    1 Minute Liters of Oxygen 0 L 0 L    2 Minute Oxygen Saturation % 96 % 96 %    2 Minute Liters of Oxygen 0 L 0 L    3 Minute Oxygen Saturation % 97 % 97 %    3 Minute Liters of Oxygen 0 L 0 L    4 Minute Oxygen Saturation %  96 % 96 %    4 Minute Liters of Oxygen 0 L 0 L    5 Minute Oxygen Saturation % 97 % 96 %    5 Minute Liters of Oxygen 0 L 0 L    6 Minute Oxygen Saturation % 96 % 96 %    6 Minute Liters of Oxygen 0 L 0 L    2 Minute Post Oxygen Saturation % 97 % 98 %     2 Minute Post Liters of Oxygen 0 L 0 L      Oxygen Initial Assessment:     Oxygen Initial Assessment - 04/29/16 0642      Home Oxygen   Home Oxygen Device None      Oxygen Re-Evaluation:   Oxygen Discharge (Final Oxygen Re-Evaluation):   Initial Exercise Prescription:     Initial Exercise Prescription - 04/14/16 1600      Date of Initial Exercise RX and Referring Provider   Date 04/14/16   Referring Provider Mock, Corene Cornea MD     Elliptical   Level 1   Speed 2.5   Minutes 15     T5 Nustep   Level 3   SPM 80   Minutes 15   METs 2     Track   Laps 25   Minutes 15   METs 2.15     Prescription Details   Frequency (times per week) 3   Duration Progress to 45 minutes of aerobic exercise without signs/symptoms of physical distress     Intensity   THRR 40-80% of Max Heartrate 125-150   Ratings of Perceived Exertion 11-13   Perceived Dyspnea 0-4     Progression   Progression Continue to progress workloads to maintain intensity without signs/symptoms of physical distress.     Resistance Training   Training Prescription Yes   Weight 3 lbs   Reps 10-15      Perform Capillary Blood Glucose checks as needed.  Exercise Prescription Changes:     Exercise Prescription Changes    Row Name 04/21/16 1300 04/30/16 1500 05/02/16 1300 05/13/16 1500 05/27/16 1500     Response to Exercise   Blood Pressure (Admit) 122/78 138/78  -- 124/78 110/64   Blood Pressure (Exercise) 164/78 160/80  -- 146/74 134/80   Blood Pressure (Exit) 118/70 126/84  -- 124/64 118/70   Heart Rate (Admit) 82 bpm 71 bpm  -- 73 bpm 96 bpm   Heart Rate (Exercise) 123 bpm 128 bpm  -- 117 bpm 116 bpm   Heart Rate (Exit) 105 bpm 102 bpm  -- 81 bpm 64 bpm   Oxygen Saturation (Admit) 96 % 98 %  -- 94 % 96 %   Oxygen Saturation (Exercise) 96 % 96 %  -- 94 % 93 %   Oxygen Saturation (Exit) 94 % 93 %  -- 93 % 94 %   Rating of Perceived Exertion (Exercise) 17 15  -- 13 13   Perceived Dyspnea  (Exercise) 3 2  -- 2 2   Symptoms SOB SOB SOB SOB SOB   Comments first full day of exercise  --  --  --  --   Duration Progress to 45 minutes of aerobic exercise without signs/symptoms of physical distress Progress to 45 minutes of aerobic exercise without signs/symptoms of physical distress Progress to 45 minutes of aerobic exercise without signs/symptoms of physical distress Continue with 45 min of aerobic exercise without signs/symptoms of physical distress. Continue with 45 min of aerobic exercise without  signs/symptoms of physical distress.   Intensity THRR unchanged THRR unchanged THRR unchanged THRR unchanged THRR unchanged     Progression   Progression Continue to progress workloads to maintain intensity without signs/symptoms of physical distress. Continue to progress workloads to maintain intensity without signs/symptoms of physical distress. Continue to progress workloads to maintain intensity without signs/symptoms of physical distress. Continue to progress workloads to maintain intensity without signs/symptoms of physical distress. Continue to progress workloads to maintain intensity without signs/symptoms of physical distress.   Average METs 2.28 2.91 2.91 2.81 2.96     Resistance Training   Training Prescription Yes Yes Yes Yes Yes   Weight 3 lbs 3 lbs 3 lbs 5 lbs 4 lbs   Reps 10-15 10-15 10-15 10-15 10-15     Interval Training   Interval Training No No No No No     Treadmill   MPH  -- 2.5 2.5 2.5 2.5   Grade  -- 0 0 0 0   Minutes  -- 11 11 15 15    METs  -- 2.91 2.91 2.91 2.91     Elliptical   Level 1 3 3 3 3    Speed 2.5 3.2 3.2 3.5  --   Minutes 13  8 min, 5 min 15 15 15 15      T5 Nustep   Level 3 3 3 4 5    SPM 85 87 87 81 68   Minutes 15 15 15 15 15    METs 2.4 2.9 2.9 2.7 3     Track   Laps 25  --  --  --  --   Minutes 15  --  --  --  --   METs 2.15  --  --  --  --     Home Exercise Plan   Plans to continue exercise at  --  -- [employer] (comment)   [employer] se[employer] (comment)  [employer] se[employer] (comment)  [employer]   Frequency  --  -- Add 1 additional day to program exercise sessions. Add 1 additional day to program exercise sessions. Add 1 additional day to program exercise sessions.   Initial Home Exercises Provided  --  -- 05/02/16 05/02/16 05/02/16      Exercise Comments:     Exercise Comments    Row Name 04/21/16 1255 04/23/16 1536         Exercise Comments First full day of exercise!  Patient was oriented to gym and equipment including functions, settings, policies, and procedures.  Patient's individual exercise prescription and treatment plan were reviewed.  All starting workloads were established based on the results of the 6 minute walk test done at initial orientation visit.  The plan for exercise progression was also introduced and progression will be customized based on patient's performance and goals. After today, he is willing to try the treadmill for walking.         Exercise Goals and Review:     Exercise Goals    Row Name 04/14/16 1605             Exercise Goals   Increase Physical Activity Yes       Intervention Provide advice, education, support and counseling about physical activity/exercise needs.;Develop an individualized exercise prescription for aerobic and resistive training based on initial evaluation findings, risk stratification, comorbidities and participant's personal goals.       Expected Outcomes Achievement of increased cardiorespiratory fitness and enhanced flexibility, muscular endurance and strength shown through measurements  of functional capacity and personal statement of participant.       Increase Strength and Stamina Yes       Intervention Provide advice, education, support and counseling about physical activity/exercise needs.;Develop an individualized exercise prescription for aerobic and resistive training based on initial evaluation findings, risk  stratification, comorbidities and participant's personal goals.       Expected Outcomes Achievement of increased cardiorespiratory fitness and enhanced flexibility, muscular endurance and strength shown through measurements of functional capacity and personal statement of participant.          Exercise Goals Re-Evaluation :     Exercise Goals Re-Evaluation    Row Name 04/30/16 1537 05/02/16 1344 05/13/16 1527 05/27/16 1523       Exercise Goal Re-Evaluation   Exercise Goals Review Increase Physical Activity;Increase Strenth and Stamina Increase Physical Activity;Increase Strenth and Stamina Increase Physical Activity;Increase Strenth and Stamina Increase Physical Activity;Increase Strenth and Stamina    Comments Lorenz is off to a good start in rehab.  He is now walking on the treadmill at 2.48mh versus the track.  He has also increased his stepper and ellipitcal to level 3.  We will continue to monitor Renan's progression. Reviewed home exercise with pt today.  Pt plans to walk and go [employer].  Reviewed THR, pulse, RPE, sign and symptoms, and when to call 911 or MD.  Also discussed weather considerations and indoor options.  Pt voiced understanding. PDembahas been doing well in rehab.  He is going to the gym some, but he is trying to make it more of a routine.  He is up to 2.5 mph on the treadmill.  He is also getting his full 468m of exercise!  We will continue to monitor his progression. PaDyonontinues to do well in rehab.  He is now up to 3 METs on the T5.  We will continue to monitor his progression.    Expected Outcomes Short: PaDonterriusill start to add incline to the treadmill.  Long: Continue to work on stIT sales professionalShort: Add in at least one day of exercise at the gym consistently.  Long: Become more independent at exercising on his own. Short: Continue to make exercise at the gym consistent.  Long: Continue to work on stIT sales professionalShort: Add incline to treadmill.   Long: Make exercise part of routine.       Discharge Exercise Prescription (Final Exercise Prescription Changes):     Exercise Prescription Changes - 05/27/16 1500      Response to Exercise   Blood Pressure (Admit) 110/64   Blood Pressure (Exercise) 134/80   Blood Pressure (Exit) 118/70   Heart Rate (Admit) 96 bpm   Heart Rate (Exercise) 116 bpm   Heart Rate (Exit) 64 bpm   Oxygen Saturation (Admit) 96 %   Oxygen Saturation (Exercise) 93 %   Oxygen Saturation (Exit) 94 %   Rating of Perceived Exertion (Exercise) 13   Perceived Dyspnea (Exercise) 2   Symptoms SOB   Duration Continue with 45 min of aerobic exercise without signs/symptoms of physical distress.   Intensity THRR unchanged     Progression   Progression Continue to progress workloads to maintain intensity without signs/symptoms of physical distress.   Average METs 2.96     Resistance Training   Training Prescription Yes   Weight 4 lbs   Reps 10-15     Interval Training   Interval Training No     Treadmill  MPH 2.5   Grade 0   Minutes 15   METs 2.91     Elliptical   Level 3   Minutes 15     T5 Nustep   Level 5   SPM 68   Minutes 15   METs 3     Home Exercise Plan   Plans to continue exercise at [facility] (comment)  [employer]   Frequency Add 1 additional day to program exercise sessions.   Initial Home Exercises Provided 05/02/16      Nutrition:  Target Goals: Understanding of nutrition guidelines, daily intake of sodium <1526m, cholesterol <2032m calories 30% from fat and 7% or less from saturated fats, daily to have 5 or more servings of fruits and vegetables.  Biometrics:     Pre Biometrics - 04/14/16 1605      Pre Biometrics   Height 6' 0.4" (1.839 m)   Weight 238 lb 3.2 oz (108 kg)   Waist Circumference 43 inches   Hip Circumference 41 inches   Waist to Hip Ratio 1.05 %   BMI (Calculated) 32       Nutrition Therapy Plan and Nutrition Goals:   Nutrition  Discharge: Rate Your Plate Scores:   Nutrition Goals Re-Evaluation:   Nutrition Goals Discharge (Final Nutrition Goals Re-Evaluation):   Psychosocial: Target Goals: Acknowledge presence or absence of significant depression and/or stress, maximize coping skills, provide positive support system. Participant is able to verbalize types and ability to use techniques and skills needed for reducing stress and depression.   Initial Review & Psychosocial Screening:     Initial Psych Review & Screening - 04/14/16 1556      Family Dynamics   Good Support System? Yes   Comments Mr SmKobleras good support from his wife and children. 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 Also Mr SmSheelernd his wife love to dance, but he is unable to dance at the level she has known before his shortness of breath. Mr SmMarcellas looking forward to improving his stamina though his participation in [facility]    Barriers   Psychosocial barriers to participate in program There are no identifiable barriers or psychosocial needs.;The patient should benefit from training in stress management and relaxation.     Screening Interventions   Interventions Encouraged to exercise;Program counselor consult      Quality of Life Scores:     Quality of Life - 04/14/16 1603      Quality of Life Scores   Health/Function Pre 20.81 %   Socioeconomic Pre 19.75 %   Psych/Spiritual Pre 20.86 %   Family Pre 21 %   GLOBAL Pre 20.61 %      PHQ-9: Recent Review Flowsheet Data    [facility] 04/14/2016 01/29/2015 10/17/2014   Decreased Interest 1 1 1    Down, Depressed, Hopeless 1 0 0   PHQ - 2 Score 2 1 1    Altered sleeping 0 - -   Tired, decreased energy 2 - -   Change in appetite 0 - -   Feeling bad or failure about yourself  2 - -   Trouble concentrating 0 - -   Moving slowly or fidgety/restless 0 - -   Suicidal thoughts 0 - -    PHQ-9 Score 6 - -   Difficult doing work/chores Somewhat difficult - -     Interpretation of Total Score  Total Score Depression Severity:  1-4 = Minimal depression, 5-9 = Mild depression, 10-14 = Moderate depression, 15-19 = Moderately severe depression, 20-27 = Severe depression   Psychosocial Evaluation and Intervention:     Psychosocial Evaluation - 04/28/16 1208      Psychosocial Evaluation & Interventions   Comments Mr Ellsworth Sabino) has returned to this program subsequent to a lung infection several months ago.  Counselor met with him for an initial psychosocial evaluation.  He will be 61 years old in May and continues to have a strong support system with a spouse and his son who lives locally.  Gryphon has multiple health issues and is on medical disability as a result.  He sleeps approximately 6 hours each night with some interruption due to bathroom breaks.  He denies a history of depression or anxiety or current symptoms - although when he was recently sick he was more "down," which is to be expected.  Pedrohenrique states he is generally in a positive mood and other than his health he has minimal stress in his life currently.  He has goals to breathe better; increase his stamina and strength; and lose more weight - although he proudly announced he has already lost 5 pounds over the past two weeks.  Counselor and staff will be following with Bertil throughout the course of this program.     Expected Outcomes Vihan will exercise consistently to achieve his stated goals.  He will also meet with the dietician to help with his weight loss goals.     Continue Psychosocial Services  Follow up required by staff      Psychosocial Re-Evaluation:     Psychosocial Re-Evaluation    [place] Name 05/23/16 1252             Psychosocial Re-Evaluation   Comments Mr Smiths 30 day psychosocial assessment reveals no barriers to participation in Pulmonary Rehab. Psychosocial areas that are currently affecting  patient's rehab experience include concerns about breathing and SOB. Patient does continue to exhibit positive coping skills to deal with illness and psychosocial concerns. Offered emotional support and reassurance. Patient does feel he is making progress toward Pulmonary Rehab goals. Patient reports his health and activity level has improved in the past 30 days as evidenced by patient's report of increased ability to do work around his house. Patient states family/friends have noticed changes in his activity or mood. Patient reports feeling positive about current and projected progression in Pulmonary Rehab although he still struggles with SOB it is better. After reviewing the patient's treatment plan, the patient is making progress toward Pulmonary Rehab goals. Patient's rate of progress toward rehab goals is good. Plan of action to help patient continue to work towards rehab goals include continued smoking cessation, continued dietary changes and limiting salt and sugar intake, bieng compliant with medications and treatment plans to improve/maintain health and SOB. Will continue to monitor and evaluate progress toward psychosocial goal(s).          Psychosocial Discharge (Final Psychosocial Re-Evaluation):     Psychosocial Re-Evaluation - 05/23/16 1252      Psychosocial Re-Evaluation   Comments Mr Smiths 30 day psychosocial assessment reveals no barriers to participation in Pulmonary Rehab. Psychosocial areas that are currently affecting patient's rehab experience include concerns about breathing and SOB. Patient does continue to exhibit positive coping skills to deal with illness and psychosocial concerns. Offered emotional support and reassurance. Patient does feel he is making progress toward Pulmonary Rehab goals. Patient reports his  health and activity level has improved in the past 30 days as evidenced by patient's report of increased ability to do work around his house. Patient states  family/friends have noticed changes in his activity or mood. Patient reports feeling positive about current and projected progression in Pulmonary Rehab although he still struggles with SOB it is better. After reviewing the patient's treatment plan, the patient is making progress toward Pulmonary Rehab goals. Patient's rate of progress toward rehab goals is good. Plan of action to help patient continue to work towards rehab goals include continued smoking cessation, continued dietary changes and limiting salt and sugar intake, bieng compliant with medications and treatment plans to improve/maintain health and SOB. Will continue to monitor and evaluate progress toward psychosocial goal(s).      Education: Education Goals: Education classes will be provided on a weekly basis, covering required topics. Participant will state understanding/return demonstration of topics presented.  Learning Barriers/Preferences:     Learning Barriers/Preferences - 04/14/16 1546      Learning Barriers/Preferences   Learning Barriers None   Learning Preferences None      Education Topics: Initial Evaluation Education: - Verbal, written and demonstration of respiratory meds, RPE/PD scales, oximetry and breathing techniques. Instruction on use of nebulizers and MDIs: cleaning and proper use, rinsing mouth with steroid doses and importance of monitoring MDI activations.   Pulmonary Rehab from 06/06/2016 in [facility] Cardiac and Pulmonary Rehab  Date  04/14/16  Educator  LB  Instruction Review Code  2- meets goals/outcomes      General Nutrition Guidelines/Fats and Fiber: -Group instruction provided by verbal, written material, models and posters to present the general guidelines for heart healthy nutrition. Gives an explanation and review of dietary fats and fiber.   Pulmonary Rehab from 06/06/2016 in [facility] Cardiac and Pulmonary Rehab  Date  05/05/16  Educator  CR  Instruction Review Code  2- meets goals/outcomes       Controlling Sodium/Reading Food Labels: -Group verbal and written material supporting the discussion of sodium use in heart healthy nutrition. Review and explanation with models, verbal and written materials for utilization of the food label.   Pulmonary Rehab from 06/06/2016 in [facility] Cardiac and Pulmonary Rehab  Date  05/12/16  Educator  CR  Instruction Review Code  2- meets goals/outcomes      Exercise Physiology & Risk Factors: - Group verbal and written instruction with models to review the exercise physiology of the cardiovascular system and associated critical values. Details cardiovascular disease risk factors and the goals associated with each risk factor.   Aerobic Exercise & Resistance Training: - Gives group verbal and written discussion on the health impact of inactivity. On the components of aerobic and resistive training programs and the benefits of this training and how to safely progress through these programs.   Pulmonary Rehab from 02/05/2015 in [facility] Cardiac and Pulmonary Rehab  Date  01/31/15  Educator  SW  Instruction Review Code  2- meets goals/outcomes      Flexibility, Balance, General Exercise Guidelines: - Provides group verbal and written instruction on the benefits of flexibility and balance training programs. Provides general exercise guidelines with specific guidelines to those with heart or lung disease. Demonstration and skill practice provided.   Pulmonary Rehab from 06/06/2016 in [facility] Cardiac and Pulmonary Rehab  Date  06/06/16  Educator  AS  Instruction Review Code  2- meets goals/outcomes      Stress Management: - Provides group verbal and written instruction about the health  risks of elevated stress, cause of high stress, and healthy ways to reduce stress.   Pulmonary Rehab from 02/05/2015 in [facility] Cardiac and Pulmonary Rehab  Date  11/08/14  Educator  [facility]  Instruction Review Code  2- meets goals/outcomes      Depression: - Provides group  verbal and written instruction on the correlation between heart/lung disease and depressed mood, treatment options, and the stigmas associated with seeking treatment.   Pulmonary Rehab from 06/06/2016 in [facility] Cardiac and Pulmonary Rehab  Date  06/04/16  Educator  [facility]  Instruction Review Code  2- meets goals/outcomes      Exercise & Equipment Safety: - Individual verbal instruction and demonstration of equipment use and safety with use of the equipment.   Pulmonary Rehab from 06/06/2016 in [facility] Cardiac and Pulmonary Rehab  Date  04/21/16  Educator  AS  Instruction Review Code  2- meets goals/outcomes      Infection Prevention: - Provides verbal and written material to individual with discussion of infection control including proper hand washing and proper equipment cleaning during exercise session.   Pulmonary Rehab from 06/06/2016 in [facility] Cardiac and Pulmonary Rehab  Date  04/21/16  Educator  AS  Instruction Review Code  2- meets goals/outcomes      Falls Prevention: - Provides verbal and written material to individual with discussion of falls prevention and safety.   Pulmonary Rehab from 02/05/2015 in [facility] Cardiac and Pulmonary Rehab  Date  10/17/14  Educator  LB  Instruction Review Code  2- meets goals/outcomes      Diabetes: - Individual verbal and written instruction to review signs/symptoms of diabetes, desired ranges of glucose level fasting, after meals and with exercise. Advice that pre and post exercise glucose checks will be done for 3 sessions at entry of program.   Chronic Lung Diseases: - Group verbal and written instruction to review new updates, new respiratory medications, new advancements in procedures and treatments. Provide informative websites and "800" numbers of self-education.   Pulmonary Rehab from 02/05/2015 in [facility] Cardiac and Pulmonary Rehab  Date  12/15/14 [facility]]  Educator  CE  Instruction Review Code  2- meets goals/outcomes      Lung  Procedures: - Group verbal and written instruction to describe testing methods done to diagnose lung disease. Review the outcome of test results. Describe the treatment choices: Pulmonary Function Tests, ABGs and oximetry.   Pulmonary Rehab from 02/05/2015 in [facility] Cardiac and Pulmonary Rehab  Date  11/10/14  Educator  [place]  Instruction Review Code  2- meets goals/outcomes      Energy Conservation: - Provide group verbal and written instruction for methods to conserve energy, plan and organize activities. Instruct on pacing techniques, use of adaptive equipment and posture/positioning to relieve shortness of breath.   Pulmonary Rehab from 02/05/2015 in [facility] Cardiac and Pulmonary Rehab  Date  11/22/14  Educator  SW  Instruction Review Code  2- meets goals/outcomes      Triggers: - Group verbal and written instruction to review types of environmental controls: home humidity, furnaces, filters, dust mite/pet prevention, HEPA vacuums. To discuss weather changes, air quality and the benefits of nasal washing.   Pulmonary Rehab from 06/06/2016 in [facility] Cardiac and Pulmonary Rehab  Date  04/30/16  Educator  LB  Instruction Review Code  2- meets goals/outcomes      Exacerbations: - Group verbal and written instruction to provide: warning signs, infection symptoms, calling MD promptly, preventive modes, and value of vaccinations.  Review: effective airway clearance, coughing and/or vibration techniques. Create an Sports administrator.   Pulmonary Rehab from 06/06/2016 in [facility] Cardiac and Pulmonary Rehab  Date  04/23/16  Educator  [place]  Instruction Review Code  2- meets goals/outcomes      Oxygen: - Individual and group verbal and written instruction on oxygen therapy. Includes supplement oxygen, available portable oxygen systems, continuous and intermittent flow rates, oxygen safety, concentrators, and Medicare reimbursement for oxygen.   Respiratory Medications: - Group verbal and written instruction to  review medications for lung disease. Drug class, frequency, complications, importance of spacers, rinsing mouth after steroid MDI's, and proper cleaning methods for nebulizers.   Pulmonary Rehab from 02/05/2015 in [facility] Cardiac and Pulmonary Rehab  Date  10/17/14  Educator  LB  Instruction Review Code  2- meets goals/outcomes      AED/CPR: - Group verbal and written instruction with the use of models to demonstrate the basic use of the AED with the basic ABC's of resuscitation.   Pulmonary Rehab from 06/06/2016 in [facility] Cardiac and Pulmonary Rehab  Date  05/02/16  Educator  [place]  Instruction Review Code  2- meets goals/outcomes      Breathing Retraining: - Provides individuals verbal and written instruction on purpose, frequency, and proper technique of diaphragmatic breathing and pursed-lipped breathing. Applies individual practice skills.   Pulmonary Rehab from 02/05/2015 in [facility] Cardiac and Pulmonary Rehab  Date  10/20/14  Educator  [place]  Instruction Review Code  2- meets goals/outcomes      Anatomy and Physiology of the Lungs: - Group verbal and written instruction with the use of models to provide basic lung anatomy and physiology related to function, structure and complications of lung disease.   Heart Failure: - Group verbal and written instruction on the basics of heart failure: signs/symptoms, treatments, explanation of ejection fraction, enlarged heart and cardiomyopathy.   Pulmonary Rehab from 06/06/2016 in [facility] Cardiac and Pulmonary Rehab  Date  05/23/16  Educator  CE  Instruction Review Code  2- meets goals/outcomes      Sleep Apnea: - Individual verbal and written instruction to review Obstructive Sleep Apnea. Review of risk factors, methods for diagnosing and types of masks and machines for OSA.   Anxiety: - Provides group, verbal and written instruction on the correlation between heart/lung disease and anxiety, treatment options, and management of  anxiety.   Relaxation: - Provides group, verbal and written instruction about the benefits of relaxation for patients with heart/lung disease. Also provides patients with examples of relaxation techniques.   Pulmonary Rehab from 06/06/2016 in [facility] Cardiac and Pulmonary Rehab  Date  05/07/16  Educator  [facility]  Instruction Review Code  2- Meets goals/outcomes      Knowledge Questionnaire Score:     Knowledge Questionnaire Score - 04/14/16 1546      Knowledge Questionnaire Score   Pre Score 10/10       Core Components/Risk Factors/Patient Goals at Admission:     Personal Goals and Risk Factors at Admission - 04/14/16 1551      Core Components/Risk Factors/Patient Goals on Admission    Weight Management Yes   Intervention Weight Management: Develop a combined nutrition and exercise program designed to reach desired caloric intake, while maintaining appropriate intake of nutrient and fiber, sodium and fats, and appropriate energy expenditure required for the weight goal.;Weight Management: Provide education and appropriate resources to help participant work on and attain dietary goals.   Admit Weight 238 lb 3.2 oz (108 kg)  Goal Weight: Short Term 233 lb (105.7 kg)   Goal Weight: Long Term 210 lb (95.3 kg)   Expected Outcomes Short Term: Continue to assess and modify interventions until short term weight is achieved;Long Term: Adherence to nutrition and physical activity/exercise program aimed toward attainment of established weight goal;Weight Loss: Understanding of general recommendations for a balanced deficit meal plan, which promotes 1-2 lb weight loss per week and includes a negative energy balance of [phone] kcal/d;Understanding of distribution of calorie intake throughout the day with the consumption of 4-5 meals/snacks;Understanding recommendations for meals to include 15-35% energy as protein, 25-35% energy from fat, 35-60% energy from carbohydrates, less than 263m of dietary  cholesterol, 20-35 gm of total fiber daily   Tobacco Cessation Yes   Number of packs per day Mr Lutter's quit date was 07/2015. Occasionally he has a cigarette, but he is now nauseated when he smokes one.   Improve shortness of breath with ADL's Yes   Intervention Provide education, individualized exercise plan and daily activity instruction to help decrease symptoms of SOB with activities of daily living.   Expected Outcomes Short Term: Achieves a reduction of symptoms when performing activities of daily living.   Develop more efficient breathing techniques such as purse lipped breathing and diaphragmatic breathing; and practicing self-pacing with activity Yes   Intervention Provide education, demonstration and support about specific breathing techniuqes utilized for more efficient breathing. Include techniques such as pursed lipped breathing, diaphragmatic breathing and self-pacing activity.   Expected Outcomes Short Term: Participant will be able to demonstrate and use breathing techniques as needed throughout daily activities.   Increase knowledge of respiratory medications and ability to use respiratory devices properly  Yes  Spiriva, Symbicort, albuterol MDI - uses spacer.   Intervention Provide education and demonstration as needed of appropriate use of medications, inhalers, and oxygen therapy.   Expected Outcomes Short Term: Achieves understanding of medications use. Understands that oxygen is a medication prescribed by physician. Demonstrates appropriate use of inhaler and oxygen therapy.   Hypertension Yes   Intervention Provide education on lifestyle modifcations including regular physical activity/exercise, weight management, moderate sodium restriction and increased consumption of fresh fruit, vegetables, and low fat dairy, alcohol moderation, and smoking cessation.;Monitor prescription use compliance.   Expected Outcomes Short Term: Continued assessment and intervention until BP is <  140/962mHG in hypertensive participants. < 130/8057mG in hypertensive participants with diabetes, heart failure or chronic kidney disease.;Long Term: Maintenance of blood pressure at goal levels.   Lipids Yes   Intervention Provide education and support for participant on nutrition & aerobic/resistive exercise along with prescribed medications to achieve LDL <92m8mDL >40mg67mExpected Outcomes Short Term: Participant states understanding of desired cholesterol values and is compliant with medications prescribed. Participant is following exercise prescription and nutrition guidelines.;Long Term: Cholesterol controlled with medications as prescribed, with individualized exercise RX and with personalized nutrition plan. Value goals: LDL < 92mg,55m > 40 mg.      Core Components/Risk Factors/Patient Goals Review:      Goals and Risk Factor Review    Row Name 04/29/16 0642 07829/[id number] 1102 05/23/16 1210         Core Components/Risk Factors/Patient Goals Review   Personal Goals Review Weight Management/Obesity;Improve shortness of breath with ADL's;Increase knowledge of respiratory medications and ability to use respiratory devices properly.;Develop more efficient breathing techniques such as purse lipped breathing and diaphragmatic breathing and practicing self-pacing with activity.;Tobacco Cessation;Lipids Weight Management/Obesity;Improve shortness of breath with ADL's;Increase knowledge  of respiratory medications and ability to use respiratory devices properly.;Develop more efficient breathing techniques such as purse lipped breathing and diaphragmatic breathing and practicing self-pacing with activity.;Lipids Weight Management/Obesity;Tobacco Cessation;Improve shortness of breath with ADL's;Increase knowledge of respiratory medications and ability to use respiratory devices properly.;Develop more efficient breathing techniques such as purse lipped breathing and diaphragmatic breathing and practicing  self-pacing with activity.;Lipids     Review Mr Sweetin has completed 4 sessions of [facility]. He continues to use PLB with good technique and continues to pace with his exercise and activities. His smoking cessation is going well - no cigarrettes for several weeks. He has loss 5lbs since starting the program and is working on a healthier diet. Mr Bartnick has a good understanding of his MDI's. For now, he is trying a new inhaler, Stiolta, instead of his Symbicort. He seems to notice an improved difference with this inhaler. His  exercise goal is to dance 12mns of shag with his wife with some modification to the dance. Mr spozziis compliant with his lipid medication.  -- Mr. SRineerhas completed 13 sessions of [facility]. He is using PLB with good technique but states while his SOB with ADL's is slightly improved, it is still a noticable struggle for him. He continues with smoking cessation since quiting. He stats while he initially lost 5 lbs he has gained 3 back and that  he is working hard to better his diet, limiting sodium and sugar intake. He works out at least 2 other days/week at a gym for no less than 45 minuts-1hour. While his weight has gone back up he states he has moved down a notch on his belt , so that is encouraging for him. His lipids were drawn last week and all his numbers have improved. He will now be on a 6 month recheck . Mr. SKilnerstates he has a good understanding of his respiratory medications, MDI's and his new inhaler SDoran Stableris still working well.      Expected Outcomes Continue progressing with his exercise goals and maintaining total smoking cessation.  -- Continue progressing with exercise, weight loss goals and smoking cessation, and reduction of lipid levels.         Core Components/Risk Factors/Patient Goals at Discharge (Final Review):      Goals and Risk Factor Review - 05/23/16 1210      Core Components/Risk Factors/Patient Goals Review   Personal Goals Review Weight  Management/Obesity;Tobacco Cessation;Improve shortness of breath with ADL's;Increase knowledge of respiratory medications and ability to use respiratory devices properly.;Develop more efficient breathing techniques such as purse lipped breathing and diaphragmatic breathing and practicing self-pacing with activity.;Lipids   Review Mr. SYeohas completed 13 sessions of [facility]. He is using PLB with good technique but states while his SOB with ADL's is slightly improved, it is still a noticable struggle for him. He continues with smoking cessation since quiting. He stats while he initially lost 5 lbs he has gained 3 back and that  he is working hard to better his diet, limiting sodium and sugar intake. He works out at least 2 other days/week at a gym for no less than 45 minuts-1hour. While his weight has gone back up he states he has moved down a notch on his belt , so that is encouraging for him. His lipids were drawn last week and all his numbers have improved. He will now be on a 6 month recheck . Mr. STouheystates he has a good understanding of his respiratory  medications, MDI's and his new inhaler Doran Stabler is still working well.    Expected Outcomes Continue progressing with exercise, weight loss goals and smoking cessation, and reduction of lipid levels.       ITP Comments:     ITP Comments    Row Name 04/25/16 1238 05/12/16 0912 05/12/16 1430 06/09/16 0843     ITP Comments [facility] 04/25/16 CE 30 day note review with Dr Emily Filbert, Medical Director of [facility] [name] did not complete his rehab session due to illness - head congestion and sore throat. Recommended he call his physician for an antibiotic prescription. 30 day note review by Dr Emily Filbert, Medical Director of [facility]       Comments: 30 day note review by Dr Emily Filbert, Medical Director of [place]

## 2016-06-09 NOTE — Progress Notes (Signed)
Daily Session Note  Patient Details  Name: Nathaniel [name] MRN: [id number] Date of Birth: Jun 16, 1955 Referring Provider:     Pulmonary Rehab from 04/14/2016 in [facility] Cardiac and Pulmonary Rehab  Referring Provider  Jeronimo Greaves MD      Encounter Date: 06/09/2016  Check In:     Session Check In - 06/09/16 1534      Check-In   Location [facility]-Cardiac & Pulmonary Rehab   Staff Present Carson Myrtle, BS, RRT, Respiratory Therapist;Kelly Amedeo Plenty, BS, ACSM CEP, Exercise Physiologist;Amanda Oletta Darter, BA, ACSM CEP, Exercise Physiologist   Supervising physician immediately available to respond to emergencies [facility] immediately available ER MD   Physician(s) Kerman Passey and Schaevitz   Medication changes reported     No   Fall or balance concerns reported    No   Warm-up and Cool-down Performed as group-led instruction   Resistance Training Performed Yes   VAD Patient? No     Pain Assessment   Currently in Pain? No/denies         History  Smoking Status  . Former Smoker  . Years: 45.00  . Types: Cigarettes  . Quit date: 07/2015  Smokeless Tobacco  . Never Used    Comment: no cigarettes currently    Goals Met:  Proper associated with RPD/PD & O2 Sat Independence with exercise equipment Exercise tolerated well Strength training completed today  Goals Unmet:  Not Applicable  Comments: Pt able to follow exercise prescription today without complaint.  Will continue to monitor for progression.    Dr. Emily Filbert is Medical Director for [place] and [facility] Pulmonary Rehabilitation.

## 2016-06-11 ENCOUNTER — Encounter: Payer: Medicaid Other | Admitting: *Deleted

## 2016-06-11 DIAGNOSIS — J449 Chronic obstructive pulmonary disease, unspecified: Secondary | ICD-10-CM

## 2016-06-11 NOTE — Progress Notes (Signed)
Daily Session Note  Patient Details  Name: Nathaniel [name] MRN: [id number] Date of Birth: [birth year] Referring Provider:     Pulmonary Rehab from 04/14/2016 in [facility] Cardiac and Pulmonary Rehab  Referring Provider  Jeronimo Greaves MD      Encounter Date: 06/11/2016  Check In:     Session Check In - 06/11/16 1133      Check-In   Location [facility]-Cardiac & Pulmonary Rehab   Staff Present Heath Lark, RN, BSN, CCRP;Jaykwon Morones [place], MA, ACSM RCEP, Exercise Physiologist;Laureen Owens Shark, BS, RRT, Respiratory Therapist   Supervising physician immediately available to respond to emergencies [facility] immediately available ER MD   Physician(s) Drs. Darl Householder and McShane   Medication changes reported     No   Fall or balance concerns reported    No   Warm-up and Cool-down Performed as group-led Location manager Performed Yes   VAD Patient? No     Pain Assessment   Currently in Pain? No/denies   Multiple Pain Sites No           Exercise Prescription Changes - 06/11/16 1400      Response to Exercise   Blood Pressure (Admit) 124/64   Blood Pressure (Exercise) 124/60   Blood Pressure (Exit) 126/74   Heart Rate (Admit) 88 bpm   Heart Rate (Exercise) 122 bpm   Heart Rate (Exit) 112 bpm   Oxygen Saturation (Admit) 93 %   Oxygen Saturation (Exercise) 92 %   Oxygen Saturation (Exit) 96 %   Rating of Perceived Exertion (Exercise) 13   Perceived Dyspnea (Exercise) 3   Symptoms SOB   Duration Continue with 45 min of aerobic exercise without signs/symptoms of physical distress.   Intensity THRR unchanged     Progression   Progression Continue to progress workloads to maintain intensity without signs/symptoms of physical distress.   Average METs 2.93     Resistance Training   Training Prescription Yes   Weight 4 lbs   Reps 10-15     Interval Training   Interval Training No     Treadmill   MPH 2.5   Grade 1   Minutes 15   METs 3.26     Elliptical   Level 4   Speed 3.4    Minutes 15     T5 Nustep   Level 5   SPM 68   Minutes 15   METs 2.5     Home Exercise Plan   Plans to continue exercise at [employer] (comment)  [employer]   Frequency Add 1 additional day to program exercise sessions.   Initial Home Exercises Provided 05/02/16      History  Smoking Status  . Former Smoker  . Years: 45.00  . Types: Cigarettes  . Quit date: 07/2015  Smokeless Tobacco  . Never Used    Comment: no cigarettes currently    Goals Met:  Proper associated with RPD/PD & O2 Sat Independence with exercise equipment Using PLB without cueing & demonstrates good technique Exercise tolerated well Strength training completed today  Goals Unmet:  Not Applicable  Comments: Pt able to follow exercise prescription today without complaint.  Will continue to monitor for progression.    Dr. Emily Filbert is Medical Director for [place] and [facility] Pulmonary Rehabilitation.

## 2016-06-13 ENCOUNTER — Encounter: Payer: Medicaid Other | Admitting: *Deleted

## 2016-06-13 DIAGNOSIS — J449 Chronic obstructive pulmonary disease, unspecified: Secondary | ICD-10-CM

## 2016-06-13 NOTE — Progress Notes (Signed)
Daily Session Note  Patient Details  Name: Nathaniel [name] MRN: [id number] Date of Birth: 1955-01-30 Referring Provider:     Pulmonary Rehab from 04/14/2016 in [facility] Cardiac and Pulmonary Rehab  Referring Provider  Jeronimo Greaves MD      Encounter Date: 06/13/2016  Check In:     Session Check In - 06/13/16 1145      Check-In   Location [facility]-Cardiac & Pulmonary Rehab   Staff Present Gerlene Burdock, RN, Vickki Hearing, BA, ACSM CEP, Exercise Physiologist;Jessica Luan Pulling, MA, ACSM RCEP, Exercise Physiologist   Supervising physician immediately available to respond to emergencies [facility] immediately available ER MD   Physician(s) Dr. Clearnce Hasten and Dr. Corky Downs   Medication changes reported     No   Fall or balance concerns reported    No   Tobacco Cessation No Change   Warm-up and Cool-down Performed as group-led instruction   Resistance Training Performed Yes   VAD Patient? No     Pain Assessment   Currently in Pain? No/denies         History  Smoking Status  . Former Smoker  . Years: 45.00  . Types: Cigarettes  . Quit date: 07/2015  Smokeless Tobacco  . Never Used    Comment: no cigarettes currently    Goals Met:  Proper associated with RPD/PD & O2 Sat Exercise tolerated well No report of cardiac concerns or symptoms  Goals Unmet:  Not Applicable  Comments:     Dr. Emily Filbert is Medical Director for [place] and [facility] Pulmonary Rehabilitation.

## 2016-06-13 NOTE — Progress Notes (Signed)
Pulmonary Individual Treatment Plan  Patient Details  Name: Nathaniel [name] MRN: [id number] Date of Birth: August 14, 1955 Referring Provider:     Pulmonary Rehab from 04/14/2016 in [facility] Cardiac and Pulmonary Rehab  Referring Provider  Jeronimo Greaves MD      Initial Encounter Date:    Pulmonary Rehab from 04/14/2016 in [facility] Cardiac and Pulmonary Rehab  Date  04/14/16  Referring Provider  Jeronimo Greaves MD      Visit Diagnosis: COPD, moderate ([place])  Chronic obstructive pulmonary disease, unspecified COPD type ([place])  Patient's Home Medications on Admission:  Current Outpatient Prescriptions:  .  albuterol (PROVENTIL HFA;VENTOLIN HFA) 108 (90 BASE) MCG/ACT inhaler, Inhale 2 puffs into the lungs every 6 (six) hours as needed for wheezing or shortness of breath., Disp: , Rfl:  .  albuterol (PROVENTIL HFA;VENTOLIN HFA) 108 (90 BASE) MCG/ACT inhaler, Inhale into the lungs., Disp: , Rfl:  .  aspirin EC 81 MG tablet, Take 81 mg by mouth., Disp: , Rfl:  .  budesonide-formoterol (SYMBICORT) 160-4.5 MCG/ACT inhaler, Inhale into the lungs., Disp: , Rfl:  .  enalapril (VASOTEC) 10 MG tablet, Take 1 tablet by mouth at bedtime., Disp: , Rfl: 11 .  fluticasone (FLONASE) 50 MCG/ACT nasal spray, 1 spray by Each Nare route two (2) times a day as needed. Frequency:BID   Dosage:50   MCG  Instructions:  Note:Dose: 50MCG, Disp: , Rfl:  .  hydrocortisone cream 1 %, Apply 1 application topically 2 (two) times daily as needed., Disp: , Rfl:  .  levalbuterol (XOPENEX HFA) 45 MCG/ACT inhaler, Inhale into the lungs., Disp: , Rfl:  .  meloxicam (MOBIC) 7.5 MG tablet, Take 15 mg by mouth., Disp: , Rfl:  .  Na Sulfate-K Sulfate-Mg Sulf (SUPREP BOWEL PREP) SOLN, Take 1 kit by mouth as directed. (Patient not taking: Reported on 02/20/2016), Disp: 1 Bottle, Rfl: 0 .  oxyCODONE-acetaminophen (PERCOCET/ROXICET) 5-325 MG per tablet, Take 1 tablet by mouth every 6 (six) hours as needed., Disp: , Rfl: 0 .  pravastatin (PRAVACHOL) 10 MG  tablet, Take 10 mg by mouth daily., Disp: , Rfl:  .  sildenafil (VIAGRA) 100 MG tablet, Take 1 tablet by mouth as needed., Disp: , Rfl:  .  tiotropium (SPIRIVA HANDIHALER) 18 MCG inhalation capsule, Place into inhaler and inhale., Disp: , Rfl:   Past Medical History: Past Medical History:  Diagnosis Date  . Arthritis   . Chronic back pain    Dr. Quay Burow manages (per pt)  . COPD (chronic obstructive pulmonary disease) ([place])   . Hearing loss   . Hyperlipidemia   . Hypertension   . MGUS (monoclonal gammopathy of unknown significance)   . Multiple myeloma ([place])   . Proteinuria   . Tobacco abuse     Tobacco Use: History  Smoking Status  . Former Smoker  . Years: 45.00  . Types: Cigarettes  . Quit date: 07/2015  Smokeless Tobacco  . Never Used    Comment: no cigarettes currently    Labs: Recent Review Flowsheet Data    There is no flowsheet data to display.       ADL UCSD:     Pulmonary Assessment Scores    Row Name 04/14/16 1549 06/04/16 1238       ADL UCSD   ADL Phase Entry Mid    SOB Score total 51 60    Rest 1 1    Walk 2 3    Stairs 4 4    Bath 1 2  Dress 0 1    Shop 0 3      mMRC Score   mMRC Score 2 2       Pulmonary Function Assessment:     Pulmonary Function Assessment - 04/14/16 1546      Initial Spirometry Results   FVC% 72.7 %   FEV1% 37.2 %   FEV1/FVC Ratio 39     Post Bronchodilator Spirometry Results   FVC% 84.3 %   FEV1% 43.8 %   FEV1/FVC Ratio 40     Breath   Bilateral Breath Sounds Clear;Decreased   Shortness of Breath Yes;Limiting activity;Fear of Shortness of Breath      Exercise Target Goals:    Exercise Program Goal: Individual exercise prescription set with THRR, safety & activity barriers. Participant demonstrates ability to understand and report RPE using BORG scale, to self-measure pulse accurately, and to acknowledge the importance of the exercise prescription.  Exercise Prescription Goal: Starting with  aerobic activity 30 plus minutes a day, 3 days per week for initial exercise prescription. Provide home exercise prescription and guidelines that participant acknowledges understanding prior to discharge.  Activity Barriers & Risk Stratification:     Activity Barriers & Cardiac Risk Stratification - 04/14/16 1602      Activity Barriers & Cardiac Risk Stratification   Activity Barriers Deconditioning;Shortness of Breath;Joint Problems;Back Problems  back pain, left knee pain      6 Minute Walk:     6 Minute Walk    Row Name 04/14/16 1600 06/04/16 1239       6 Minute Walk   Phase Initial Mid Program    Distance 1000 feet 1480 feet    Distance % Change  - 48 %  480 ft    Walk Time 6 minutes 56 minutes    # of Rest Breaks 0 0    MPH 1.89 2.8    METS 2.95 3.82    RPE 11 12    Perceived Dyspnea  2 2    VO2 Peak 10.33 13.38    Symptoms Yes (comment) No    Comments left knee pain 2/10  -    Resting HR 101 bpm 84 bpm    Resting BP 126/64 124/70    Max Ex. HR 110 bpm 108 bpm    Max Ex. BP 136/70 146/74    2 Minute Post BP 136/66 126/70      Interval HR   Baseline HR 101 84    1 Minute HR 107 101    2 Minute HR 105 107    3 Minute HR 106 108    4 Minute HR 106 107    5 Minute HR 110 108    6 Minute HR 106 107    2 Minute Post HR 95 98    Interval Heart Rate? Yes Yes      Interval Oxygen   Interval Oxygen? Yes Yes    Baseline Oxygen Saturation % 96 % 98 %    Baseline Liters of Oxygen 0 L  Room Air 0 L  Room Air    1 Minute Oxygen Saturation % 96 % 97 %    1 Minute Liters of Oxygen 0 L 0 L    2 Minute Oxygen Saturation % 96 % 96 %    2 Minute Liters of Oxygen 0 L 0 L    3 Minute Oxygen Saturation % 97 % 97 %    3 Minute Liters of Oxygen 0 L 0 L  4 Minute Oxygen Saturation % 96 % 96 %    4 Minute Liters of Oxygen 0 L 0 L    5 Minute Oxygen Saturation % 97 % 96 %    5 Minute Liters of Oxygen 0 L 0 L    6 Minute Oxygen Saturation % 96 % 96 %    6 Minute Liters of  Oxygen 0 L 0 L    2 Minute Post Oxygen Saturation % 97 % 98 %    2 Minute Post Liters of Oxygen 0 L 0 L      Oxygen Initial Assessment:     Oxygen Initial Assessment - 04/29/16 0642      Home Oxygen   Home Oxygen Device None      Oxygen Re-Evaluation:   Oxygen Discharge (Final Oxygen Re-Evaluation):   Initial Exercise Prescription:     Initial Exercise Prescription - 04/14/16 1600      Date of Initial Exercise RX and Referring Provider   Date 04/14/16   Referring Provider Mock, Corene Cornea MD     Elliptical   Level 1   Speed 2.5   Minutes 15     T5 Nustep   Level 3   SPM 80   Minutes 15   METs 2     Track   Laps 25   Minutes 15   METs 2.15     Prescription Details   Frequency (times per week) 3   Duration Progress to 45 minutes of aerobic exercise without signs/symptoms of physical distress     Intensity   THRR 40-80% of Max Heartrate 125-150   Ratings of Perceived Exertion 11-13   Perceived Dyspnea 0-4     Progression   Progression Continue to progress workloads to maintain intensity without signs/symptoms of physical distress.     Resistance Training   Training Prescription Yes   Weight 3 lbs   Reps 10-15      Perform Capillary Blood Glucose checks as needed.  Exercise Prescription Changes:     Exercise Prescription Changes    Row Name 04/21/16 1300 04/30/16 1500 05/02/16 1300 05/13/16 1500 05/27/16 1500     Response to Exercise   Blood Pressure (Admit) 122/78 138/78  - 124/78 110/64   Blood Pressure (Exercise) 164/78 160/80  - 146/74 134/80   Blood Pressure (Exit) 118/70 126/84  - 124/64 118/70   Heart Rate (Admit) 82 bpm 71 bpm  - 73 bpm 96 bpm   Heart Rate (Exercise) 123 bpm 128 bpm  - 117 bpm 116 bpm   Heart Rate (Exit) 105 bpm 102 bpm  - 81 bpm 64 bpm   Oxygen Saturation (Admit) 96 % 98 %  - 94 % 96 %   Oxygen Saturation (Exercise) 96 % 96 %  - 94 % 93 %   Oxygen Saturation (Exit) 94 % 93 %  - 93 % 94 %   Rating of Perceived Exertion  (Exercise) 17 15  - 13 13   Perceived Dyspnea (Exercise) 3 2  - 2 2   Symptoms _0    Comments first full day of exercise  -  -  -  -   Duration Progress to 45 minutes of aerobic exercise without signs/symptoms of physical distress Progress to 45 minutes of aerobic exercise without signs/symptoms of physical distress Progress to 45 minutes of aerobic exercise without signs/symptoms of physical distress Continue with 45 min of aerobic exercise without signs/symptoms of physical distress. Continue with 45  min of aerobic exercise without signs/symptoms of physical distress.   Intensity _0      Progression   Progression Continue to progress workloads to maintain intensity without signs/symptoms of physical distress. Continue to progress workloads to maintain intensity without signs/symptoms of physical distress. Continue to progress workloads to maintain intensity without signs/symptoms of physical distress. Continue to progress workloads to maintain intensity without signs/symptoms of physical distress. Continue to progress workloads to maintain intensity without signs/symptoms of physical distress.   Average METs 2.28 2.91 2.91 2.81 2.96     Resistance Training   Training Prescription _1    Weight 3 lbs 3 lbs 3 lbs 5 lbs 4 lbs   Reps 10-15 10-15 10-15 10-15 10-15     Interval Training   Interval Training _2      Treadmill   MPH  - 2.5 2.5 2.5 2.5   Grade  - 0 0 0 0   Minutes  - _3 METs  - 2.91 2.91 2.91 2.91     Elliptical   Level _4 Speed 2.5 3.2 3.2 3.5  -   Minutes 13  8 min, 5 min _5 T5 Nustep   Level _6 SPM 85 87 87 81 68   Minutes _7 METs 2.4 2.9 2.9 2.7 3     Track   Laps 25  -  -  -  -   Minutes 15  -  -  -  -   METs 2.15  -  -  -  -     Home Exercise Plan   Plans to continue exercise at  -  -  [employer] (comment)  [employer] (comment)  [employer] (comment)  [employer]   Frequency  -  - Add 1 additional day to program exercise sessions. Add 1 additional day to program exercise sessions. Add 1 additional day to program exercise sessions.   Initial Home Exercises Provided  -  - 05/02/16 05/02/16 05/02/16   Row Name 06/11/16 1400             Response to Exercise   Blood Pressure (Admit) 124/64       Blood Pressure (Exercise) 124/60       Blood Pressure (Exit) 126/74       Heart Rate (Admit) 88 bpm       Heart Rate (Exercise) 122 bpm       Heart Rate (Exit) 112 bpm       Oxygen Saturation (Admit) 93 %       Oxygen Saturation (Exercise) 92 %       Oxygen Saturation (Exit) 96 %       Rating of Perceived Exertion (Exercise) 13       Perceived Dyspnea (Exercise) 3       Symptoms SOB       Duration Continue with 45 min of aerobic exercise without signs/symptoms of physical distress.       Intensity THRR unchanged         Progression   Progression Continue to progress workloads to maintain intensity without signs/symptoms of physical distress.       Average METs 2.93         Resistance Training  Training Prescription Yes       Weight 4 lbs       Reps 10-15         Interval Training   Interval Training No         Treadmill   MPH 2.5       Grade 1       Minutes 15       METs 3.26         Elliptical   Level 4       Speed 3.4       Minutes 15         T5 Nustep   Level 5       SPM 68       Minutes 15       METs 2.5         Home Exercise Plan   Plans to continue exercise at [employer] (comment)  [employer]       Frequency Add 1 additional day to program exercise sessions.       Initial Home Exercises Provided 05/02/16          Exercise Comments:     Exercise Comments    Row Name 04/21/16 1255 04/23/16 1536         Exercise Comments First full day of exercise!  Patient was oriented to gym and  equipment including functions, settings, policies, and procedures.  Patient's individual exercise prescription and treatment plan were reviewed.  All starting workloads were established based on the results of the 6 minute walk test done at initial orientation visit.  The plan for exercise progression was also introduced and progression will be customized based on patient's performance and goals. After today, he is willing to try the treadmill for walking.         Exercise Goals and Review:     Exercise Goals    Row Name 04/14/16 1605             Exercise Goals   Increase Physical Activity Yes       Intervention Provide advice, education, support and counseling about physical activity/exercise needs.;Develop an individualized exercise prescription for aerobic and resistive training based on initial evaluation findings, risk stratification, comorbidities and participant's personal goals.       Expected Outcomes Achievement of increased cardiorespiratory fitness and enhanced flexibility, muscular endurance and strength shown through measurements of functional capacity and personal statement of participant.       Increase Strength and Stamina Yes       Intervention Provide advice, education, support and counseling about physical activity/exercise needs.;Develop an individualized exercise prescription for aerobic and resistive training based on initial evaluation findings, risk stratification, comorbidities and participant's personal goals.       Expected Outcomes Achievement of increased cardiorespiratory fitness and enhanced flexibility, muscular endurance and strength shown through measurements of functional capacity and personal statement of participant.          Exercise Goals Re-Evaluation :     Exercise Goals Re-Evaluation    Row Name 04/30/16 1537 05/02/16 1344 05/13/16 1527 05/27/16 1523 06/11/16 1359     Exercise Goal Re-Evaluation   Exercise Goals Review Increase Physical  Activity;Increase Strenth and Stamina Increase Physical Activity;Increase Strenth and Stamina Increase Physical Activity;Increase Strenth and Stamina Increase Physical Activity;Increase Strenth and Stamina Increase Physical Activity;Increase Strenth and Stamina   Comments Keylan is off to a good start in rehab.  He is now walking on the treadmill at  2.54mh versus the track.  He has also increased his stepper and ellipitcal to level 3.  We will continue to monitor Seraj's progression. Reviewed home exercise with pt today.  Pt plans to walk and go [facility] [employer].  Reviewed THR, pulse, RPE, sign and symptoms, and when to call 911 or MD.  Also discussed weather considerations and indoor options.  Pt voiced understanding. PDevionhas been doing well in rehab.  He is going to the gym some, but he is trying to make it more of a routine.  He is up to 2.5 mph on the treadmill.  He is also getting his full 465m of exercise!  We will continue to monitor his progression. PaYuyaontinues to do well in rehab.  He is now up to 3 METs on the T5.  We will continue to monitor his progression. PaOctaviaas been doing well in rehab.  He is feeling stronger overall.  He is now up to level 4 on the elliptical and he has added some incline on the treadmill.  We will continue to monitor his progress.   Expected Outcomes Short: PaFoxxill start to add incline to the treadmill.  Long: Continue to work on stIT sales professionalShort: Add in at least one day of exercise at the gym consistently.  Long: Become more independent at exercising on his own. Short: Continue to make exercise at the gym consistent.  Long: Continue to work on stIT sales professionalShort: Add incline to treadmill.  Long: Make exercise part of routine. Short: Continue to work on the incline on the treadmill.  Long: Continue to make exercise part of routine.      Discharge Exercise Prescription (Final Exercise Prescription Changes):     Exercise Prescription  Changes - 06/11/16 1400      Response to Exercise   Blood Pressure (Admit) 124/64   Blood Pressure (Exercise) 124/60   Blood Pressure (Exit) 126/74   Heart Rate (Admit) 88 bpm   Heart Rate (Exercise) 122 bpm   Heart Rate (Exit) 112 bpm   Oxygen Saturation (Admit) 93 %   Oxygen Saturation (Exercise) 92 %   Oxygen Saturation (Exit) 96 %   Rating of Perceived Exertion (Exercise) 13   Perceived Dyspnea (Exercise) 3   Symptoms SOB   Duration Continue with 45 min of aerobic exercise without signs/symptoms of physical distress.   Intensity THRR unchanged     Progression   Progression Continue to progress workloads to maintain intensity without signs/symptoms of physical distress.   Average METs 2.93     Resistance Training   Training Prescription Yes   Weight 4 lbs   Reps 10-15     Interval Training   Interval Training No     Treadmill   MPH 2.5   Grade 1   Minutes 15   METs 3.26     Elliptical   Level 4   Speed 3.4   Minutes 15     T5 Nustep   Level 5   SPM 68   Minutes 15   METs 2.5     Home Exercise Plan   [facility] [facility] [employer]  [employer]   Frequency Add 1 additional day to program exercise sessions.   Initial Home Exercises Provided 05/02/16      Nutrition:  Target Goals: Understanding of nutrition guidelines, daily intake of sodium <150047mcholesterol <200m51malories 30% from fat and 7% or less from saturated fats, daily  to have 5 or more servings of fruits and vegetables.  Biometrics:     Pre Biometrics - 04/14/16 1605      Pre Biometrics   Height 6' 0.4" (1.839 m)   Weight 238 lb 3.2 oz (108 kg)   Waist Circumference 43 inches   Hip Circumference 41 inches   Waist to Hip Ratio 1.05 %   BMI (Calculated) 32       Nutrition Therapy Plan and Nutrition Goals:   Nutrition Discharge: Rate Your Plate Scores:   Nutrition Goals Re-Evaluation:     Nutrition Goals Re-Evaluation    [place] Name 06/13/16 1219              Goals   Current Weight 238 lb (108 kg)       Nutrition Goal To cut back       Comment Nathaniel [name] reports that he has cut out sugar and salt and is trying to eat more fruits. Sukhman reports "I still eat alot but I am trying to eat healthier".          Nutrition Goals Discharge (Final Nutrition Goals Re-Evaluation):     Nutrition Goals Re-Evaluation - 06/13/16 1219      Goals   Current Weight 238 lb (108 kg)   Nutrition Goal To cut back   Comment Nathaniel [name] reports that he has cut out sugar and salt and is trying to eat more fruits. Nathaniel [name] reports "I still eat alot but I am trying to eat healthier".      Psychosocial: Target Goals: Acknowledge presence or absence of significant depression and/or stress, maximize coping skills, provide positive support system. Participant is able to verbalize types and ability to use techniques and skills needed for reducing stress and depression.   Initial Review & Psychosocial Screening:     Initial Psych Review & Screening - 04/14/16 1556      Family Dynamics   Good Support System? Yes   Comments Nathaniel [name] has good support from his wife and children. 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 Also Nathaniel [name] and his wife love to dance, but he is unable to dance at the level she has known before his shortness of breath. Nathaniel [name] is looking forward to improving his stamina though his participation in [place].     Barriers   Psychosocial barriers to participate in program There are no identifiable barriers or psychosocial needs.;The patient should benefit from training in stress management and relaxation.     Screening Interventions   Interventions Encouraged to exercise;Program counselor consult      Quality of Life Scores:     Quality of Life - 04/14/16 1603      Quality of Life Scores   Health/Function Pre 20.81 %   Socioeconomic Pre 19.75 %   Psych/Spiritual Pre  20.86 %   Family Pre 21 %   GLOBAL Pre 20.61 %      PHQ-9: Recent Review Flowsheet Data    Depression screen [facility] 2/9 04/14/2016 01/29/2015 10/17/2014   Decreased Interest _0 Down, Depressed, Hopeless 1 0 0   PHQ - 2 Score _1 Altered sleeping 0 - -   Tired, decreased energy 2 - -   Change in appetite 0 - -   Feeling bad or failure about yourself  2 - -   Trouble concentrating 0 - -   Moving  slowly or fidgety/restless 0 - -   Suicidal thoughts 0 - -   PHQ-9 Score 6 - -   Difficult doing work/chores Somewhat difficult - -     Interpretation of Total Score  Total Score Depression Severity:  1-4 = Minimal depression, 5-9 = Mild depression, 10-14 = Moderate depression, 15-19 = Moderately severe depression, 20-27 = Severe depression   Psychosocial Evaluation and Intervention:     Psychosocial Evaluation - 04/28/16 1208      Psychosocial Evaluation & Interventions   Comments Nathaniel [place]) has returned to this program subsequent to a lung infection several months ago.  Counselor met with him for an initial psychosocial evaluation.  He will be 61 years old in May and continues to have a strong support system with a spouse and his son who lives locally.  Labaron has multiple health issues and is on medical disability as a result.  He sleeps approximately 6 hours each night with some interruption due to bathroom breaks.  He denies a history of depression or anxiety or current symptoms - although when he was recently sick he was more "down," which is to be expected.  Jayleen states he is generally in a positive mood and other than his health he has minimal stress in his life currently.  He has goals to breathe better; increase his stamina and strength; and lose more weight - although he proudly announced he has already lost 5 pounds over the past two weeks.  Counselor and staff will be following with Habee[place] throughout the course of this program.     Expected Outcomes Nathaniel [name] will exercise  consistently to achieve his stated goals.  He will also meet with the dietician to help with his weight loss goals.     Continue Psychosocial Services  Follow up required by staff      Psychosocial Re-Evaluation:     Psychosocial Re-Evaluation    [place] Name 05/23/16 1252 06/13/16 1222           Psychosocial Re-Evaluation   Comments Nathaniel [name] 30 day psychosocial assessment reveals no barriers to participation in Pulmonary Rehab. Psychosocial areas that are currently affecting patient's rehab experience include concerns about breathing and SOB. Patient does continue to exhibit positive coping skills to deal with illness and psychosocial concerns. Offered emotional support and reassurance. Patient does feel he is making progress toward Pulmonary Rehab goals. Patient reports his health and activity level has improved in the past 30 days as evidenced by patient's report of increased ability to do work around his house. Patient states family/friends have noticed changes in his activity or mood. Patient reports feeling positive about current and projected progression in Pulmonary Rehab although he still struggles with SOB it is better. After reviewing the patient's treatment plan, the patient is making progress toward Pulmonary Rehab goals. Patient's rate of progress toward rehab goals is good. Plan of action to help patient continue to work towards rehab goals include continued smoking cessation, continued dietary changes and limiting salt and sugar intake, bieng compliant with medications and treatment plans to improve/maintain health and SOB. Will continue to monitor and evaluate progress toward psychosocial goal(s). Nathaniel [name] said he is feeling better since he quit smoking. Nathaniel [name] reports that he will attend a dance event with his wife but can't compete in the actual dance with her "I don't have the breath for it".       Expected Outcomes  - Cont. pulmonary healthier lifestyle.  Psychosocial Discharge  (Final Psychosocial Re-Evaluation):     Psychosocial Re-Evaluation - 06/13/16 1222      Psychosocial Re-Evaluation   Comments Nathaniel [name] said he is feeling better since he quit smoking. Nathaniel [name] reports that he will attend a dance event with his wife but can't compete in the actual dance with her "I don't have the breath for it".    Expected Outcomes Cont. pulmonary healthier lifestyle.      Education: Education Goals: Education classes will be provided on a weekly basis, covering required topics. Participant will state understanding/return demonstration of topics presented.  Learning Barriers/Preferences:     Learning Barriers/Preferences - 04/14/16 1546      Learning Barriers/Preferences   Learning Barriers None   Learning Preferences None      Education Topics: Initial Evaluation Education: - Verbal, written and demonstration of respiratory meds, RPE/PD scales, oximetry and breathing techniques. Instruction on use of nebulizers and MDIs: cleaning and proper use, rinsing mouth with steroid doses and importance of monitoring MDI activations.   Pulmonary Rehab from 06/11/2016 in [facility] Cardiac and Pulmonary Rehab  Date  04/14/16  Educator  LB  Instruction Review Code  2- meets goals/outcomes      General Nutrition Guidelines/Fats and Fiber: -Group instruction provided by verbal, written material, models and posters to present the general guidelines for heart healthy nutrition. Gives an explanation and review of dietary fats and fiber.   Pulmonary Rehab from 06/11/2016 in [facility] Cardiac and Pulmonary Rehab  Date  05/05/16  Educator  CR  Instruction Review Code  2- meets goals/outcomes      Controlling Sodium/Reading Food Labels: -Group verbal and written material supporting the discussion of sodium use in heart healthy nutrition. Review and explanation with models, verbal and written materials for utilization of the food label.   Pulmonary Rehab from 06/11/2016 in [facility] Cardiac and Pulmonary  Rehab  Date  05/12/16  Educator  CR  Instruction Review Code  2- meets goals/outcomes      Exercise Physiology & Risk Factors: - Group verbal and written instruction with models to review the exercise physiology of the cardiovascular system and associated critical values. Details cardiovascular disease risk factors and the goals associated with each risk factor.   Aerobic Exercise & Resistance Training: - Gives group verbal and written discussion on the health impact of inactivity. On the components of aerobic and resistive training programs and the benefits of this training and how to safely progress through these programs.   Pulmonary Rehab from 02/05/2015 in [facility] Cardiac and Pulmonary Rehab  Date  01/31/15  Educator  SW  Instruction Review Code  2- meets goals/outcomes      Flexibility, Balance, General Exercise Guidelines: - Provides group verbal and written instruction on the benefits of flexibility and balance training programs. Provides general exercise guidelines with specific guidelines to those with heart or lung disease. Demonstration and skill practice provided.   Pulmonary Rehab from 06/11/2016 in [facility] Cardiac and Pulmonary Rehab  Date  06/06/16  Educator  AS  Instruction Review Code  2- meets goals/outcomes      Stress Management: - Provides group verbal and written instruction about the health risks of elevated stress, cause of high stress, and healthy ways to reduce stress.   Pulmonary Rehab from 02/05/2015 in [facility] Cardiac and Pulmonary Rehab  Date  11/08/14  Educator  [facility]  Instruction Review Code  2- meets goals/outcomes      Depression: - Provides group verbal and written instruction on the  correlation between heart/lung disease and depressed mood, treatment options, and the stigmas associated with seeking treatment.   Pulmonary Rehab from 06/11/2016 in [facility] Cardiac and Pulmonary Rehab  Date  06/04/16  Educator  [facility]  Instruction Review Code  2- meets  goals/outcomes      Exercise & Equipment Safety: - Individual verbal instruction and demonstration of equipment use and safety with use of the equipment.   Pulmonary Rehab from 06/11/2016 in [facility] Cardiac and Pulmonary Rehab  Date  04/21/16  Educator  AS  Instruction Review Code  2- meets goals/outcomes      Infection Prevention: - Provides verbal and written material to individual with discussion of infection control including proper hand washing and proper equipment cleaning during exercise session.   Pulmonary Rehab from 06/11/2016 in [facility] Cardiac and Pulmonary Rehab  Date  04/21/16  Educator  AS  Instruction Review Code  2- meets goals/outcomes      Falls Prevention: - Provides verbal and written material to individual with discussion of falls prevention and safety.   Pulmonary Rehab from 02/05/2015 in [facility] Cardiac and Pulmonary Rehab  Date  10/17/14  Educator  LB  Instruction Review Code  2- meets goals/outcomes      Diabetes: - Individual verbal and written instruction to review signs/symptoms of diabetes, desired ranges of glucose level fasting, after meals and with exercise. Advice that pre and post exercise glucose checks will be done for 3 sessions at entry of program.   Chronic Lung Diseases: - Group verbal and written instruction to review new updates, new respiratory medications, new advancements in procedures and treatments. Provide informative websites and "800" numbers of self-education.   Pulmonary Rehab from 02/05/2015 in [facility] Cardiac and Pulmonary Rehab  Date  12/15/14 [facility]]  Educator  CE  Instruction Review Code  2- meets goals/outcomes      Lung Procedures: - Group verbal and written instruction to describe testing methods done to diagnose lung disease. Review the outcome of test results. Describe the treatment choices: Pulmonary Function Tests, ABGs and oximetry.   Pulmonary Rehab from 02/05/2015 in [facility] Cardiac and Pulmonary Rehab  Date   11/10/14  Educator  [place]  Instruction Review Code  2- meets goals/outcomes      Energy Conservation: - Provide group verbal and written instruction for methods to conserve energy, plan and organize activities. Instruct on pacing techniques, use of adaptive equipment and posture/positioning to relieve shortness of breath.   Pulmonary Rehab from 02/05/2015 in [facility] Cardiac and Pulmonary Rehab  Date  11/22/14  Educator  SW  Instruction Review Code  2- meets goals/outcomes      Triggers: - Group verbal and written instruction to review types of environmental controls: home humidity, furnaces, filters, dust mite/pet prevention, HEPA vacuums. To discuss weather changes, air quality and the benefits of nasal washing.   Pulmonary Rehab from 06/11/2016 in [facility] Cardiac and Pulmonary Rehab  Date  04/30/16  Educator  LB  Instruction Review Code  2- meets goals/outcomes      Exacerbations: - Group verbal and written instruction to provide: warning signs, infection symptoms, calling MD promptly, preventive modes, and value of vaccinations. Review: effective airway clearance, coughing and/or vibration techniques. Create an Sports administrator.   Pulmonary Rehab from 06/11/2016 in [facility] Cardiac and Pulmonary Rehab  Date  04/23/16  Educator  LV  Instruction Review Code  2- meets goals/outcomes      Oxygen: - Individual and group verbal and written instruction on oxygen therapy.  Includes supplement oxygen, available portable oxygen systems, continuous and intermittent flow rates, oxygen safety, concentrators, and Medicare reimbursement for oxygen.   Respiratory Medications: - Group verbal and written instruction to review medications for lung disease. Drug class, frequency, complications, importance of spacers, rinsing mouth after steroid MDI's, and proper cleaning methods for nebulizers.   Pulmonary Rehab from 02/05/2015 in [facility] Cardiac and Pulmonary Rehab  Date  10/17/14  Educator  LB  Instruction Review  Code  2- meets goals/outcomes      AED/CPR: - Group verbal and written instruction with the use of models to demonstrate the basic use of the AED with the basic ABC's of resuscitation.   Pulmonary Rehab from 06/11/2016 in [facility] Cardiac and Pulmonary Rehab  Date  05/02/16  Educator  [place]  Instruction Review Code  2- meets goals/outcomes      Breathing Retraining: - Provides individuals verbal and written instruction on purpose, frequency, and proper technique of diaphragmatic breathing and pursed-lipped breathing. Applies individual practice skills.   Pulmonary Rehab from 02/05/2015 in [facility] Cardiac and Pulmonary Rehab  Date  10/20/14  Educator  Mila Doce  Instruction Review Code  2- meets goals/outcomes      Anatomy and Physiology of the Lungs: - Group verbal and written instruction with the use of models to provide basic lung anatomy and physiology related to function, structure and complications of lung disease.   Pulmonary Rehab from 06/11/2016 in [facility] Cardiac and Pulmonary Rehab  Date  06/11/16  Educator  LB  Instruction Review Code  2- meets goals/outcomes      Heart Failure: - Group verbal and written instruction on the basics of heart failure: signs/symptoms, treatments, explanation of ejection fraction, enlarged heart and cardiomyopathy.   Pulmonary Rehab from 06/11/2016 in [facility] Cardiac and Pulmonary Rehab  Date  05/23/16  Educator  CE  Instruction Review Code  2- meets goals/outcomes      Sleep Apnea: - Individual verbal and written instruction to review Obstructive Sleep Apnea. Review of risk factors, methods for diagnosing and types of masks and machines for OSA.   Anxiety: - Provides group, verbal and written instruction on the correlation between heart/lung disease and anxiety, treatment options, and management of anxiety.   Relaxation: - Provides group, verbal and written instruction about the benefits of relaxation for patients with heart/lung disease. Also provides  patients with examples of relaxation techniques.   Pulmonary Rehab from 06/11/2016 in [facility] Cardiac and Pulmonary Rehab  Date  05/07/16  Educator  [facility]  Instruction Review Code  2- Meets goals/outcomes      Knowledge Questionnaire Score:     Knowledge Questionnaire Score - 04/14/16 1546      Knowledge Questionnaire Score   Pre Score 10/10       Core Components/Risk Factors/Patient Goals at Admission:     Personal Goals and Risk Factors at Admission - 04/14/16 1551      Core Components/Risk Factors/Patient Goals on Admission    Weight Management Yes   Intervention Weight Management: Develop a combined nutrition and exercise program designed to reach desired caloric intake, while maintaining appropriate intake of nutrient and fiber, sodium and fats, and appropriate energy expenditure required for the weight goal.;Weight Management: Provide education and appropriate resources to help participant work on and attain dietary goals.   Admit Weight 238 lb 3.2 oz (108 kg)   Goal Weight: Short Term 233 lb (105.7 kg)   Goal Weight: Long Term 210 lb (95.3 kg)   Expected Outcomes Short Term:  Continue to assess and modify interventions until short term weight is achieved;Long Term: Adherence to nutrition and physical activity/exercise program aimed toward attainment of established weight goal;Weight Loss: Understanding of general recommendations for a balanced deficit meal plan, which promotes 1-2 lb weight loss per week and includes a negative energy balance of [phone] kcal/d;Understanding of distribution of calorie intake throughout the day with the consumption of 4-5 meals/snacks;Understanding recommendations for meals to include 15-35% energy as protein, 25-35% energy from fat, 35-60% energy from carbohydrates, less than 259m of dietary cholesterol, 20-35 gm of total fiber daily   Tobacco Cessation Yes   Number of packs per day Nathaniel [name]'s quit date was 07/2015. Occasionally he has a cigarette, but  he is now nauseated when he smokes one.   Improve shortness of breath with ADL's Yes   Intervention Provide education, individualized exercise plan and daily activity instruction to help decrease symptoms of SOB with activities of daily living.   Expected Outcomes Short Term: Achieves a reduction of symptoms when performing activities of daily living.   Develop more efficient breathing techniques such as purse lipped breathing and diaphragmatic breathing; and practicing self-pacing with activity Yes   Intervention Provide education, demonstration and support about specific breathing techniuqes utilized for more efficient breathing. Include techniques such as pursed lipped breathing, diaphragmatic breathing and self-pacing activity.   Expected Outcomes Short Term: Participant will be able to demonstrate and use breathing techniques as needed throughout daily activities.   Increase knowledge of respiratory medications and ability to use respiratory devices properly  Yes  Spiriva, Symbicort, albuterol MDI - uses spacer.   Intervention Provide education and demonstration as needed of appropriate use of medications, inhalers, and oxygen therapy.   Expected Outcomes Short Term: Achieves understanding of medications use. Understands that oxygen is a medication prescribed by physician. Demonstrates appropriate use of inhaler and oxygen therapy.   Hypertension Yes   Intervention Provide education on lifestyle modifcations including regular physical activity/exercise, weight management, moderate sodium restriction and increased consumption of fresh fruit, vegetables, and low fat dairy, alcohol moderation, and smoking cessation.;Monitor prescription use compliance.   Expected Outcomes Short Term: Continued assessment and intervention until BP is < 140/933mHG in hypertensive participants. < 130/8079mG in hypertensive participants with diabetes, heart failure or chronic kidney disease.;Long Term: Maintenance of  blood pressure at goal levels.   Lipids Yes   Intervention Provide education and support for participant on nutrition & aerobic/resistive exercise along with prescribed medications to achieve LDL <52m73mDL >40mg46mExpected Outcomes Short Term: Participant states understanding of desired cholesterol values and is compliant with medications prescribed. Participant is following exercise prescription and nutrition guidelines.;Long Term: Cholesterol controlled with medications as prescribed, with individualized exercise RX and with personalized nutrition plan. Value goals: LDL < 52mg,63m > 40 mg.      Core Components/Risk Factors/Patient Goals Review:      Goals and Risk Factor Review    Row Name 04/29/16 0642 03419/18 1102 05/23/16 1210 06/13/16 1220       Core Components/Risk Factors/Patient Goals Review   Personal Goals Review Weight Management/Obesity;Improve shortness of breath with ADL's;Increase knowledge of respiratory medications and ability to use respiratory devices properly.;Develop more efficient breathing techniques such as purse lipped breathing and diaphragmatic breathing and practicing self-pacing with activity.;Tobacco Cessation;Lipids Weight Management/Obesity;Improve shortness of breath with ADL's;Increase knowledge of respiratory medications and ability to use respiratory devices properly.;Develop more efficient breathing techniques such as purse lipped breathing and diaphragmatic breathing and practicing  self-pacing with activity.;Lipids Weight Management/Obesity;Tobacco Cessation;Improve shortness of breath with ADL's;Increase knowledge of respiratory medications and ability to use respiratory devices properly.;Develop more efficient breathing techniques such as purse lipped breathing and diaphragmatic breathing and practicing self-pacing with activity.;Lipids Weight Management/Obesity;Hypertension    Review Nathaniel [name] has completed 4 sessions of [facility]. He continues to use PLB  with good technique and continues to pace with his exercise and activities. His smoking cessation is going well - no cigarrettes for several weeks. He has loss 5lbs since starting the program and is working on a healthier diet. Nathaniel [name] has a good understanding of his MDI's. For now, he is trying a new inhaler, Stiolta, instead of his Symbicort. He seems to notice an improved difference with this inhaler. His  exercise goal is to dance 55mns of shag with his wife with some modification to the dance. Nathaniel sgabrielsenis compliant with his lipid medication.  - Nathaniel. [name] completed 13 sessions of [facility]. He is using PLB with good technique but states while his SOB with ADL's is slightly improved, it is still a noticable struggle for him. He continues with smoking cessation since quiting. He stats while he initially lost 5 lbs he has gained 3 back and that  he is working hard to better his diet, limiting sodium and sugar intake. He works out at least 2 other days/week at a gym for no less than 45 minuts-1hour. While his weight has gone back up he states he has moved down a notch on his belt , so that is encouraging for him. His lipids were drawn last week and all his numbers have improved. He will now be on a 6 month recheck . Nathaniel. [name] he has a good understanding of his respiratory medications, MDI's and his new inhaler SDoran Stableris still working well.  PEligahreports that he lost weight but has gained some back at 238lbs today but he has taken in a belt look. PCorbittsaid he is unable to do the dance competitions with his wife but will attend with her this weekend and get a lot of exercise. PDurellsaid he has quit smoking and "I am [place] Pauls' blood pressure was good today at 128/70. Yadriel's MD check his lipid blood work     Expected Outcomes Continue progressing with his exercise goals and maintaining total smoking cessation.  - Continue progressing  with exercise, weight loss goals and smoking cessation, and reduction of lipid levels.  Cont to not smoke, cont to cut out sugar and salt and eat healthier.        Core Components/Risk Factors/Patient Goals at Discharge (Final Review):      Goals and Risk Factor Review - 06/13/16 1220      Core Components/Risk Factors/Patient Goals Review   Personal Goals Review Weight Management/Obesity;Hypertension   Review PIzaiahreports that he lost weight but has gained some back at 238lbs today but he has taken in a belt look. PLonziesaid he is unable to do the dance competitions with his wife but will attend with her this weekend and get a lot of exercise. PCalsaid he has quit smoking and "[place] blood pressure was good today at 128/70. Jsoeph's MD check his lipid blood work    Expected Outcomes Cont to not smoke, cont to cut out  sugar and salt and eat healthier.       ITP Comments:     ITP Comments    Row Name 04/25/16 1238 05/12/16 0912 05/12/16 1430 06/09/16 0843     ITP Comments [facility] 04/25/16 CE 30 day note review with Dr Emily Filbert, Medical Director of [facility] [name] did not complete his rehab session due to illness - head congestion and sore throat. Recommended he call his physician for an antibiotic prescription. 30 day note review by Dr Emily Filbert, Medical Director of [facility]       Comments: is at visit 21/36 visits of Pulmonary Rehab.

## 2016-06-16 DIAGNOSIS — J449 Chronic obstructive pulmonary disease, unspecified: Secondary | ICD-10-CM

## 2016-06-16 NOTE — Progress Notes (Signed)
Daily Session Note  Patient Details  Name: Nathaniel [name] MRN: [id number] Date of Birth: [birth year] Referring Provider:     Pulmonary Rehab from 04/14/2016 in [facility] Cardiac and Pulmonary Rehab  Referring Provider  Jeronimo Greaves MD      Encounter Date: 06/16/2016  Check In:     Session Check In - 06/16/16 1251      Check-In   Location [facility]-Cardiac & Pulmonary Rehab   Staff Present Carson Myrtle, BS, RRT, Respiratory Dareen Piano, BA, ACSM CEP, Exercise Physiologist;Kelly Amedeo Plenty, BS, ACSM CEP, Exercise Physiologist   Supervising physician immediately available to respond to emergencies [facility] immediately available ER MD   Physician(s) Mable Paris and Quentin Cornwall   Medication changes reported     No   Fall or balance concerns reported    No   Warm-up and Cool-down Performed as group-led instruction   Resistance Training Performed Yes   VAD Patient? No     Pain Assessment   Currently in Pain? No/denies         History  Smoking Status  . Former Smoker  . Years: 45.00  . Types: Cigarettes  . Quit date: 07/2015  Smokeless Tobacco  . Never Used    Comment: no cigarettes currently    Goals Met:  Proper associated with RPD/PD & O2 Sat Independence with exercise equipment Exercise tolerated well Strength training completed today  Goals Unmet:  Not Applicable  Comments: Pt able to follow exercise prescription today without complaint.  Will continue to monitor for progression.    Dr. Emily Filbert is Medical Director for [place] and [facility] Pulmonary Rehabilitation.

## 2016-06-18 ENCOUNTER — Encounter: Payer: Medicaid Other | Admitting: *Deleted

## 2016-06-18 DIAGNOSIS — J449 Chronic obstructive pulmonary disease, unspecified: Secondary | ICD-10-CM | POA: Diagnosis not present

## 2016-06-18 NOTE — Progress Notes (Signed)
Daily Session Note  Patient Details  Name: Mclean Moya MRN: [id number] Date of Birth: [birth year] Referring Provider:     Pulmonary Rehab from 04/14/2016 in [facility] Cardiac and Pulmonary Rehab  Referring Provider  Jeronimo Greaves MD      Encounter Date: 06/18/2016  Check In:     Session Check In - 06/18/16 1146      Check-In   Location [facility]-Cardiac & Pulmonary Rehab   Staff Present Carson Myrtle, BS, RRT, Respiratory Therapist;Jerin Franzel Sherryll Burger, RN BSN;Jessica Luan Pulling, MA, ACSM RCEP, Exercise Physiologist   Supervising physician immediately available to respond to emergencies [facility] immediately available ER MD   Physician(s) Dr. Kerman Passey and Mariea Clonts   Medication changes reported     No   Fall or balance concerns reported    No   Tobacco Cessation No Change   Warm-up and Cool-down Performed as group-led instruction   Resistance Training Performed Yes   VAD Patient? No     Pain Assessment   Currently in Pain? No/denies         History  Smoking Status  . Former Smoker  . Years: 45.00  . Types: Cigarettes  . Quit date: 07/2015  Smokeless Tobacco  . Never Used    Comment: no cigarettes currently    Goals Met:  Proper associated with RPD/PD & O2 Sat Independence with exercise equipment Using PLB without cueing & demonstrates good technique Exercise tolerated well Strength training completed today  Goals Unmet:  Not Applicable  Comments: Pt able to follow exercise prescription today without complaint.  Will continue to monitor for progression.    Dr. Emily Filbert is Medical Director for [place] and [facility] Pulmonary Rehabilitation.

## 2016-06-20 ENCOUNTER — Encounter: Payer: Medicaid Other | Admitting: *Deleted

## 2016-06-20 DIAGNOSIS — J449 Chronic obstructive pulmonary disease, unspecified: Secondary | ICD-10-CM | POA: Diagnosis not present

## 2016-06-20 NOTE — Progress Notes (Signed)
Daily Session Note  Patient Details  Name: Nathaniel [name] MRN: [id number] Date of Birth: [birth year] Referring Provider:     Pulmonary Rehab from 04/14/2016 in [facility] Cardiac and Pulmonary Rehab  Referring Provider  Jeronimo Greaves MD      Encounter Date: 06/20/2016  Check In:     Session Check In - 06/20/16 1240      Check-In   Location [facility]-Cardiac & Pulmonary Rehab   Staff Present Heath Lark, RN, BSN, CCRP;Ysabel Cowgill Sherryll Burger, RN BSN;Carroll Enterkin, RN, BSN;Joseph Hood RCP,RRT,BSRT;Amanda Oletta Darter, [place], ACSM CEP, Exercise Physiologist   Supervising physician immediately available to respond to emergencies [facility] immediately available ER MD   Physician(s) Dr. Kerman Passey and Jimmye Norman    Medication changes reported     No   Fall or balance concerns reported    No   Warm-up and Cool-down Performed as group-led instruction   Resistance Training Performed Yes   VAD Patient? No     Pain Assessment   Currently in Pain? No/denies         History  Smoking Status  . Former Smoker  . Years: 45.00  . Types: Cigarettes  . Quit date: 07/2015  Smokeless Tobacco  . Never Used    Comment: no cigarettes currently    Goals Met:  Proper associated with RPD/PD & O2 Sat Independence with exercise equipment Using PLB without cueing & demonstrates good technique Exercise tolerated well Strength training completed today  Goals Unmet:  Not Applicable  Comments: Pt able to follow exercise prescription today without complaint.  Will continue to monitor for progression.    Dr. Emily Filbert is Medical Director for [place] and [facility] Pulmonary Rehabilitation.

## 2016-06-23 ENCOUNTER — Encounter: Payer: Medicaid Other | Admitting: Respiratory Therapy

## 2016-06-23 DIAGNOSIS — J449 Chronic obstructive pulmonary disease, unspecified: Secondary | ICD-10-CM | POA: Diagnosis not present

## 2016-06-23 NOTE — Progress Notes (Signed)
Daily Session Note  Patient Details  Name: Nathaniel [name] MRN: [id number] Date of Birth: [birth year] Referring Provider:     Pulmonary Rehab from 04/14/2016 in [facility] Cardiac and Pulmonary Rehab  Referring Provider  Jeronimo Greaves MD      Encounter Date: 06/23/2016  Check In:     Session Check In - 06/23/16 1527      Check-In   Location [facility]-Cardiac & Pulmonary Rehab   Staff Present Justin Mend RCP,RRT,BSRT;Laureen Owens Shark, BS, RRT, Respiratory Dareen Piano, BA, ACSM CEP, Exercise Physiologist   Supervising physician immediately available to respond to emergencies [facility] immediately available ER MD   Physician(s) Dr. Burlene Arnt and Mariea Clonts   Medication changes reported     No   Fall or balance concerns reported    No   Tobacco Cessation No Change   Warm-up and Cool-down Performed as group-led instruction   Resistance Training Performed Yes   VAD Patient? No     Pain Assessment   Currently in Pain? No/denies   Multiple Pain Sites No         History  Smoking Status  . Former Smoker  . Years: 45.00  . Types: Cigarettes  . Quit date: 07/2015  Smokeless Tobacco  . Never Used    Comment: no cigarettes currently    Goals Met:  Proper associated with RPD/PD & O2 Sat Independence with exercise equipment Using PLB without cueing & demonstrates good technique Exercise tolerated well Strength training completed today  Goals Unmet:  Not Applicable  Comments: Pt able to follow exercise prescription today without complaint.  Will continue to monitor for progression.   Dr. Emily Filbert is Medical Director for [place] and [facility].

## 2016-06-25 ENCOUNTER — Encounter: Payer: Medicaid Other | Admitting: *Deleted

## 2016-06-25 DIAGNOSIS — J449 Chronic obstructive pulmonary disease, unspecified: Secondary | ICD-10-CM

## 2016-06-25 NOTE — Progress Notes (Signed)
Daily Session Note  Patient Details  Name: Nathaniel [name] MRN: [id number] Date of Birth: 1955/09/03 Referring Provider:     Pulmonary Rehab from 04/14/2016 in [facility] Cardiac and Pulmonary Rehab  Referring Provider  Jeronimo Greaves MD      Encounter Date: 06/25/2016  Check In:     Session Check In - 06/25/16 1146      Check-In   Location [facility]-Cardiac & Pulmonary Rehab   Staff Present Carson Myrtle, BS, RRT, Respiratory Therapist;Meredith Sherryll Burger, RN BSN;Joseph Darrin Nipper, [place], ACSM RCEP, Exercise Physiologist   Supervising physician immediately available to respond to emergencies [facility] immediately available ER MD   Physician(s) Dr. Alfred Levins and Jimmye Norman    Medication changes reported     No   Fall or balance concerns reported    No   Warm-up and Cool-down Performed as group-led instruction   Resistance Training Performed Yes   VAD Patient? No     Pain Assessment   Currently in Pain? No/denies         History  Smoking Status  . Former Smoker  . Years: 45.00  . Types: Cigarettes  . Quit date: 07/2015  Smokeless Tobacco  . Never Used    Comment: no cigarettes currently    Goals Met:  Proper associated with RPD/PD & O2 Sat Independence with exercise equipment Using PLB without cueing & demonstrates good technique Exercise tolerated well Strength training completed today  Goals Unmet:  Not Applicable  Comments: Pt able to follow exercise prescription today without complaint.  Will continue to monitor for progression.    Dr. Emily Filbert is Medical Director for [place] and [facility].

## 2016-06-27 ENCOUNTER — Encounter: Payer: Medicaid Other | Admitting: *Deleted

## 2016-06-27 DIAGNOSIS — J449 Chronic obstructive pulmonary disease, unspecified: Secondary | ICD-10-CM

## 2016-06-27 NOTE — Progress Notes (Signed)
Daily Session Note  Patient Details  Name: Nathaniel [name] MRN: [id number] Date of Birth: 31-Mar-1955 Referring Provider:     Pulmonary Rehab from 04/14/2016 in [facility] Cardiac and Pulmonary Rehab  Referring Provider  Jeronimo Greaves MD      Encounter Date: 06/27/2016  Check In:     Session Check In - 06/27/16 1235      Check-In   Location [facility]-Cardiac & Pulmonary Rehab   Staff Present Heath Lark, RN, BSN, CCRP;Dali Kraner Sherryll Burger, RN BSN;Joseph Darrin Nipper, [place], ACSM RCEP, Exercise Physiologist   Supervising physician immediately available to respond to emergencies [facility] immediately available ER MD   Physician(s) Dr. Jacqualine Code and Reita Cliche   Medication changes reported     No   Fall or balance concerns reported    No   Warm-up and Cool-down Performed as group-led instruction   Resistance Training Performed Yes   VAD Patient? No     Pain Assessment   Currently in Pain? No/denies         History  Smoking Status  . Former Smoker  . Years: 45.00  . Types: Cigarettes  . Quit date: 07/2015  Smokeless Tobacco  . Never Used    Comment: no cigarettes currently    Goals Met:  Proper associated with RPD/PD & O2 Sat Independence with exercise equipment Using PLB without cueing & demonstrates good technique Exercise tolerated well Strength training completed today  Goals Unmet:  Not Applicable  Comments: Pt able to follow exercise prescription today without complaint.  Will continue to monitor for progression.    Dr. Emily Filbert is Medical Director for [place] and [facility].

## 2016-06-30 DIAGNOSIS — J449 Chronic obstructive pulmonary disease, unspecified: Secondary | ICD-10-CM | POA: Diagnosis not present

## 2016-06-30 NOTE — Progress Notes (Signed)
Daily Session Note  Patient Details  Name: Nathaniel [name] MRN: [id number] Date of Birth: [birth year] Referring Provider:     Pulmonary Rehab from 04/14/2016 in [facility] Cardiac and Pulmonary Rehab  Referring Provider  Jeronimo Greaves MD      Encounter Date: 06/30/2016  Check In:     Session Check In - 06/30/16 1511      Check-In   Location [facility]-Cardiac & Pulmonary Rehab   Staff Present Carson Myrtle, BS, RRT, Respiratory Therapist;Kelly Amedeo Plenty, BS, ACSM CEP, Exercise Physiologist;Emylia Latella Oletta Darter, BA, ACSM CEP, Exercise Physiologist   Supervising physician immediately available to respond to emergencies [facility] immediately available ER MD   Physician(s) Corky Downs and Rifenbark   Medication changes reported     No   Fall or balance concerns reported    No   Warm-up and Cool-down Performed as group-led instruction   Resistance Training Performed Yes   VAD Patient? No     Pain Assessment   Currently in Pain? No/denies         History  Smoking Status  . Former Smoker  . Years: 45.00  . Types: Cigarettes  . Quit date: 07/2015  Smokeless Tobacco  . Never Used    Comment: no cigarettes currently    Goals Met:  Proper associated with RPD/PD & O2 Sat Independence with exercise equipment Exercise tolerated well Strength training completed today  Goals Unmet:  Not Applicable  Comments: Pt able to follow exercise prescription today without complaint.  Will continue to monitor for progression.    Dr. Emily Filbert is Medical Director for [place] and [facility].

## 2016-07-02 ENCOUNTER — Encounter: Payer: Medicaid Other | Admitting: *Deleted

## 2016-07-02 DIAGNOSIS — J449 Chronic obstructive pulmonary disease, unspecified: Secondary | ICD-10-CM

## 2016-07-02 NOTE — Progress Notes (Signed)
Daily Session Note  Patient Details  Name: Nathaniel [name] MRN: [id number] Date of Birth: 07/21/1955 Referring Provider:     Pulmonary Rehab from 04/14/2016 in [facility] Cardiac and Pulmonary Rehab  Referring Provider  Mock, Jason MD      Encounter Date: 07/02/2016  Check In:     Session Check In - 07/02/16 1138      Check-In   Location [facility]iac & Pulmonary Rehab   Staff Present Jessica Hawkins, MA, ACSM RCEP, Exercise Physiologist;Laureen Brown, BS, RRT, Respiratory Therapist;Meredith Craven, RN BSN   Supervising physician immediately available to respond to emergencies [facility] immediately available ER MD   Physician(s) Drs. Williams and Robinson   Medication changes reported     No   Fall or balance concerns reported    No   Warm-up and Cool-down Performed as group-led instruction   Resistance Training Performed Yes   VAD Patient? No     Pain Assessment   Currently in Pain? No/denies   Multiple Pain Sites No         History  Smoking Status  . Former Smoker  . Years: 45.00  . Types: Cigarettes  . Quit date: 07/2015  Smokeless Tobacco  . Never Used    Comment: no cigarettes currently    Goals Met:  Proper associated with RPD/PD & O2 Sat Independence with exercise equipment Using PLB without cueing & demonstrates good technique Exercise tolerated well Strength training completed today  Goals Unmet:  Not Applicable  Comments: Pt able to follow exercise prescription today without complaint.  Will continue to monitor for progression.    Dr. Mark Miller is Medical Director for [facility] and [facility]. 

## 2016-07-04 ENCOUNTER — Encounter: Payer: Medicaid Other | Admitting: *Deleted

## 2016-07-04 DIAGNOSIS — J449 Chronic obstructive pulmonary disease, unspecified: Secondary | ICD-10-CM | POA: Diagnosis not present

## 2016-07-04 NOTE — Progress Notes (Signed)
Daily Session Note  Patient Details  Name: Nathaniel [name] MRN: [id number] Date of Birth: 04/17/1955 Referring Provider:     Pulmonary Rehab from 04/14/2016 in [facility] Pulmonary Rehab  Referring Provider  Jeronimo Greaves MD      Encounter Date: 07/04/2016  Check In:     Session Check In - 07/04/16 1156      Check-In   Location [facility]-Cardiac & Pulmonary Re[facility]   Staff Present Heath Lark, RN, BSN, CCRP;Meredith Sherryll Burger, RN BSN;Jessica Luan Pulling, MA, ACSM RCEP, Exercise Physiologist   Supervising physician immediately available to respond to emergencies [facility] immediately available ER MD   Physician(s) Dr. Clearnce Hasten and Alfred Levins   Medication changes reported     No   Fall or balance concerns reported    No   Warm-up and Cool-down Performed as group-led instruction   Resistance Training Performed Yes   VAD Patient? No     Pain Assessment   Currently in Pain? No/denies         History  Smoking Status  . Former Smoker  . Years: 45.00  . Types: Cigarettes  . Quit date: 07/2015  Smokeless Tobacco  . Never Used    Comment: no cigarettes currently    Goals Met:  Proper associated with RPD/PD & O2 Sat Independence with exercise equipment Using PLB without cueing & demonstrates good technique Exercise tolerated well Strength training completed today  Goals Unmet:  Not Applicable  Comments: Pt able to follow exercise prescription today without complaint.  Will continue to monitor for progression.    Dr. Emily Filbert is Medical Director for [place] and [facility].

## 2016-07-07 ENCOUNTER — Encounter: Payer: Self-pay | Admitting: Respiratory Therapy

## 2016-07-07 ENCOUNTER — Encounter: Payer: Medicaid Other | Attending: Internal Medicine

## 2016-07-07 DIAGNOSIS — J449 Chronic obstructive pulmonary disease, unspecified: Secondary | ICD-10-CM | POA: Insufficient documentation

## 2016-07-07 NOTE — Progress Notes (Signed)
Daily Session Note  Patient Details  Name: Nathaniel [name] MRN: [id number] Date of Birth: 25-Mar-1955 Referring Provider:     Pulmonary Rehab from 04/14/2016 in [facility] Cardiac and Pulmonary Rehab  Referring Provider  Jeronimo Greaves MD      Encounter Date: 07/07/2016  Check In:     Session Check In - 07/07/16 1257      Check-In   Location [facility]-Cardiac & Pulmonary Rehab   Staff Present Carson Myrtle, BS, RRT, Respiratory Therapist;Kelly Amedeo Plenty, BS, ACSM CEP, Exercise Physiologist;Amanda Oletta Darter, BA, ACSM CEP, Exercise Physiologist   Supervising physician immediately available to respond to emergencies [facility] immediately available ER MD   Physician(s) Rifenbark and [place]   Medication changes reported     No   Fall or balance concerns reported    No   Warm-up and Cool-down Performed as group-led instruction   Resistance Training Performed Yes   VAD Patient? No     Pain Assessment   Currently in Pain? No/denies         History  Smoking Status  . Former Smoker  . Years: 45.00  . Types: Cigarettes  . Quit date: 07/2015  Smokeless Tobacco  . Never Used    Comment: no cigarettes currently    Goals Met:  Proper associated with RPD/PD & O2 Sat Independence with exercise equipment Exercise tolerated well Strength training completed today  Goals Unmet:  Not Applicable  Comments: Pt able to follow exercise prescription today without complaint.  Will continue to monitor for progression.    Dr. Emily Filbert is Medical Director for [place] and [facility].

## 2016-07-07 NOTE — Progress Notes (Signed)
Pulmonary Individual Treatment Plan  Patient Details  Name: Nathaniel [name] MRN: [id number] Date of Birth: July 31, 1955 Referring Provider:     Pulmonary Rehab from 04/14/2016 in [facility] Cardiac and Pulmonary Rehab  Referring Provider  Jeronimo Greaves MD      Initial Encounter Date:    Pulmonary Rehab from 04/14/2016 in [facility] Cardiac and Pulmonary Rehab  Date  04/14/16  Referring Provider  Jeronimo Greaves MD      Visit Diagnosis: Chronic obstructive pulmonary disease, unspecified COPD type ([place])  Patient's Home Medications on Admission:  Current Outpatient Prescriptions:    albuterol (PROVENTIL HFA;VENTOLIN HFA) 108 (90 BASE) MCG/ACT inhaler, Inhale 2 puffs into the lungs every 6 (six) hours as needed for wheezing or shortness of breath., Disp: , Rfl:    albuterol (PROVENTIL HFA;VENTOLIN HFA) 108 (90 BASE) MCG/ACT inhaler, Inhale into the lungs., Disp: , Rfl:    aspirin EC 81 MG tablet, Take 81 mg by mouth., Disp: , Rfl:    budesonide-formoterol (SYMBICORT) 160-4.5 MCG/ACT inhaler, Inhale into the lungs., Disp: , Rfl:    enalapril (VASOTEC) 10 MG tablet, Take 1 tablet by mouth at bedtime., Disp: , Rfl: 11   fluticasone (FLONASE) 50 MCG/ACT nasal spray, 1 spray by Each Nare route two (2) times a day as needed. Frequency:BID   Dosage:50   MCG  Instructions:  Note:Dose: 50MCG, Disp: , Rfl:    hydrocortisone cream 1 %, Apply 1 application topically 2 (two) times daily as needed., Disp: , Rfl:    levalbuterol (XOPENEX HFA) 45 MCG/ACT inhaler, Inhale into the lungs., Disp: , Rfl:    meloxicam (MOBIC) 7.5 MG tablet, Take 15 mg by mouth., Disp: , Rfl:    Na Sulfate-K Sulfate-Mg Sulf (SUPREP BOWEL PREP) SOLN, Take 1 kit by mouth as directed. (Patient not taking: Reported on 02/20/2016), Disp: 1 Bottle, Rfl: 0   oxyCODONE-acetaminophen (PERCOCET/ROXICET) 5-325 MG per tablet, Take 1 tablet by mouth every 6 (six) hours as needed., Disp: , Rfl: 0   pravastatin (PRAVACHOL) 10 MG tablet, Take 10 mg by  mouth daily., Disp: , Rfl:    sildenafil (VIAGRA) 100 MG tablet, Take 1 tablet by mouth as needed., Disp: , Rfl:    tiotropium (SPIRIVA HANDIHALER) 18 MCG inhalation capsule, Place into inhaler and inhale., Disp: , Rfl:   Past Medical History: Past Medical History:  Diagnosis Date   Arthritis    Chronic back pain    Dr. Quay Burow manages (per pt)   COPD (chronic obstructive pulmonary disease) ([place])    Hearing loss    Hyperlipidemia    Hypertension    MGUS (monoclonal gammopathy of unknown significance)    Multiple myeloma (HCC)    Proteinuria    Tobacco abuse     Tobacco Use: History  Smoking Status   Former Smoker   Years: 45.00   Types: Cigarettes   Quit date: 07/2015  Smokeless Tobacco   Never Used    Comment: no cigarettes currently    Labs: Recent Review Flowsheet Data    There is no flowsheet data to display.       ADL UCSD:     Pulmonary Assessment Scores    Row Name 04/14/16 1549 06/04/16 1238       ADL UCSD   ADL Phase Entry Mid    SOB Score total 51 60    Rest 1 1    Walk 2 3    Stairs 4 4    Bath 1 2    Dress 0  1    Shop 0 3      mMRC Score   mMRC Score 2 2       Pulmonary Function Assessment:     Pulmonary Function Assessment - 04/14/16 1546      Initial Spirometry Results   FVC% 72.7 %   FEV1% 37.2 %   FEV1/FVC Ratio 39     Post Bronchodilator Spirometry Results   FVC% 84.3 %   FEV1% 43.8 %   FEV1/FVC Ratio 40     Breath   Bilateral Breath Sounds Clear;Decreased   Shortness of Breath Yes;Limiting activity;Fear of Shortness of Breath      Exercise Target Goals:    Exercise Program Goal: Individual exercise prescription set with THRR, safety & activity barriers. Participant demonstrates ability to understand and report RPE using BORG scale, to self-measure pulse accurately, and to acknowledge the importance of the exercise prescription.  Exercise Prescription Goal: Starting with aerobic activity 30 plus  minutes a day, 3 days per week for initial exercise prescription. Provide home exercise prescription and guidelines that participant acknowledges understanding prior to discharge.  Activity Barriers & Risk Stratification:     Activity Barriers & Cardiac Risk Stratification - 04/14/16 1602      Activity Barriers & Cardiac Risk Stratification   Activity Barriers Deconditioning;Shortness of Breath;Joint Problems;Back Problems  back pain, left knee pain      6 Minute Walk:     6 Minute Walk    Row Name 04/14/16 1600 06/04/16 1239       6 Minute Walk   Phase Initial Mid Program    Distance 1000 feet 1480 feet    Distance % Change  -- 48 %  480 ft    Walk Time 6 minutes 56 minutes    # of Rest Breaks 0 0    MPH 1.89 2.8    METS 2.95 3.82    RPE 11 12    Perceived Dyspnea  2 2    VO2 Peak 10.33 13.38    Symptoms Yes (comment) No    Comments left knee pain 2/10  --    Resting HR 101 bpm 84 bpm    Resting BP 126/64 124/70    Max Ex. HR 110 bpm 108 bpm    Max Ex. BP 136/70 146/74    2 Minute Post BP 136/66 126/70      Interval HR   Baseline HR 101 84    1 Minute HR 107 101    2 Minute HR 105 107    3 Minute HR 106 108    4 Minute HR 106 107    5 Minute HR 110 108    6 Minute HR 106 107    2 Minute Post HR 95 98    Interval Heart Rate? Yes Yes      Interval Oxygen   Interval Oxygen? Yes Yes    Baseline Oxygen Saturation % 96 % 98 %    Baseline Liters of Oxygen 0 L  Room Air 0 L  Room Air    1 Minute Oxygen Saturation % 96 % 97 %    1 Minute Liters of Oxygen 0 L 0 L    2 Minute Oxygen Saturation % 96 % 96 %    2 Minute Liters of Oxygen 0 L 0 L    3 Minute Oxygen Saturation % 97 % 97 %    3 Minute Liters of Oxygen 0 L 0 L  4 Minute Oxygen Saturation % 96 % 96 %    4 Minute Liters of Oxygen 0 L 0 L    5 Minute Oxygen Saturation % 97 % 96 %    5 Minute Liters of Oxygen 0 L 0 L    6 Minute Oxygen Saturation % 96 % 96 %    6 Minute Liters of Oxygen 0 L 0 L    2  Minute Post Oxygen Saturation % 97 % 98 %    2 Minute Post Liters of Oxygen 0 L 0 L      Oxygen Initial Assessment:     Oxygen Initial Assessment - 04/29/16 0642      Home Oxygen   Home Oxygen Device None      Oxygen Re-Evaluation:     Oxygen Re-Evaluation    Row Name 06/23/16 1530             Program Oxygen Prescription   Program Oxygen Prescription None         Home Oxygen   Home Oxygen Device None          Oxygen Discharge (Final Oxygen Re-Evaluation):     Oxygen Re-Evaluation - 06/23/16 1530      Program Oxygen Prescription   Program Oxygen Prescription None     Home Oxygen   Home Oxygen Device None      Initial Exercise Prescription:     Initial Exercise Prescription - 04/14/16 1600      Date of Initial Exercise RX and Referring Provider   Date 04/14/16   Referring Provider Jeronimo Greaves MD     Elliptical   Level 1   Speed 2.5   Minutes 15     T5 Nustep   Level 3   SPM 80   Minutes 15   METs 2     Track   Laps 25   Minutes 15   METs 2.15     Prescription Details   Frequency (times per week) 3   Duration Progress to 45 minutes of aerobic exercise without signs/symptoms of physical distress     Intensity   THRR 40-80% of Max Heartrate 125-150   Ratings of Perceived Exertion 11-13   Perceived Dyspnea 0-4     Progression   Progression Continue to progress workloads to maintain intensity without signs/symptoms of physical distress.     Resistance Training   Training Prescription Yes   Weight 3 lbs   Reps 10-15      Perform Capillary Blood Glucose checks as needed.  Exercise Prescription Changes:     Exercise Prescription Changes    Row Name 04/21/16 1300 04/30/16 1500 05/02/16 1300 05/13/16 1500 05/27/16 1500     Response to Exercise   Blood Pressure (Admit) 122/78 138/78  -- 124/78 110/64   Blood Pressure (Exercise) 164/78 160/80  -- 146/74 134/80   Blood Pressure (Exit) 118/70 126/84  -- 124/64 118/70   Heart Rate  (Admit) 82 bpm 71 bpm  -- 73 bpm 96 bpm   Heart Rate (Exercise) 123 bpm 128 bpm  -- 117 bpm 116 bpm   Heart Rate (Exit) 105 bpm 102 bpm  -- 81 bpm 64 bpm   Oxygen Saturation (Admit) 96 % 98 %  -- 94 % 96 %   Oxygen Saturation (Exercise) 96 % 96 %  -- 94 % 93 %   Oxygen Saturation (Exit) 94 % 93 %  -- 93 % 94 %   Rating of Perceived Exertion (Exercise)  17 15  -- 13 13   Perceived Dyspnea (Exercise) 3 2  -- 2 2   Symptoms _0    Comments first full day of exercise  --  --  --  --   Duration Progress to 45 minutes of aerobic exercise without signs/symptoms of physical distress Progress to 45 minutes of aerobic exercise without signs/symptoms of physical distress Progress to 45 minutes of aerobic exercise without signs/symptoms of physical distress Continue with 45 min of aerobic exercise without signs/symptoms of physical distress. Continue with 45 min of aerobic exercise without signs/symptoms of physical distress.   Intensity _1      Progression   Progression Continue to progress workloads to maintain intensity without signs/symptoms of physical distress. Continue to progress workloads to maintain intensity without signs/symptoms of physical distress. Continue to progress workloads to maintain intensity without signs/symptoms of physical distress. Continue to progress workloads to maintain intensity without signs/symptoms of physical distress. Continue to progress workloads to maintain intensity without signs/symptoms of physical distress.   Average METs 2.28 2.91 2.91 2.81 2.96     Resistance Training   Training Prescription _2    Weight 3 lbs 3 lbs 3 lbs 5 lbs 4 lbs   Reps 10-15 10-15 10-15 10-15 10-15     Interval Training   Interval Training _3      Treadmill   MPH  -- 2.5 2.5 2.5 2.5   Grade  -- 0 0 0 0   Minutes  -- _4 METs  -- 2.91 2.91 2.91 2.91      Elliptical   Level _5 Speed 2.5 3.2 3.2 3.5  --   Minutes 13  8 min, 5 min _6 T5 Nustep   Level _7 SPM 85 87 87 81 68   Minutes _8 METs 2.4 2.9 2.9 2.7 3     Track   Laps 25  --  --  --  --   Minutes 15  --  --  --  --   METs 2.15  --  --  --  --     Home Exercise Plan   Plans to continue exercise at  --  -- [employer] (comment)  [employer] (comment)  [employer] (comment)  [employer]   Frequency  --  -- Add 1 additional day to program exercise sessions. Add 1 additional day to program exercise sessions. Add 1 additional day to program exercise sessions.   Initial Home Exercises Provided  --  -- 05/02/16 05/02/16 05/02/16   Row Name 06/11/16 1400 06/26/16 1000           Response to Exercise   Blood Pressure (Admit) 124/64 124/74      Blood Pressure (Exercise) 124/60 144/68      Blood Pressure (Exit) 126/74 122/72      Heart Rate (Admit) 88 bpm 90 bpm      Heart Rate (Exercise) 122 bpm 122 bpm      Heart Rate (Exit) 112 bpm 104 bpm      Oxygen Saturation (Admit) 93 % 96 %      Oxygen Saturation (Exercise) 92 % 94 %      Oxygen Saturation (Exit) 96 % 93 %  Rating of Perceived Exertion (Exercise) 13 13      Perceived Dyspnea (Exercise) 3 3      Symptoms SOB  --      Duration Continue with 45 min of aerobic exercise without signs/symptoms of physical distress. Continue with 45 min of aerobic exercise without signs/symptoms of physical distress.      Intensity THRR unchanged THRR unchanged        Progression   Progression Continue to progress workloads to maintain intensity without signs/symptoms of physical distress. Continue to progress workloads to maintain intensity without signs/symptoms of physical distress.      Average METs 2.93 2.95        Resistance Training   Training Prescription Yes Yes      Weight 4 lbs 6 lbs      Reps 10-15 10-15        Interval Training    Interval Training No No        Treadmill   MPH 2.5 2.5      Grade 1 1      Minutes 15 15      METs 3.26 3.26        Elliptical   Level 4 5      Speed 3.4 3.3      Minutes 15 15        T5 Nustep   Level 5 7      SPM 68 63      Minutes 15 15      METs 2.5 3.1        Home Exercise Plan   Plans to continue exercise at [employer] (comment)  [employer] (comment)  [employer]      Frequency Add 1 additional day to program exercise sessions. Add 1 additional day to program exercise sessions.      Initial Home Exercises Provided 05/02/16 05/02/16         Exercise Comments:     Exercise Comments    Row Name 04/21/16 1255 04/23/16 1536         Exercise Comments First full day of exercise!  Patient was oriented to gym and equipment including functions, settings, policies, and procedures.  Patient's individual exercise prescription and treatment plan were reviewed.  All starting workloads were established based on the results of the 6 minute walk test done at initial orientation visit.  The plan for exercise progression was also introduced and progression will be customized based on patient's performance and goals. After today, he is willing to try the treadmill for walking.         Exercise Goals and Review:     Exercise Goals    Row Name 04/14/16 1605             Exercise Goals   Increase Physical Activity Yes       Intervention Provide advice, education, support and counseling about physical activity/exercise needs.;Develop an individualized exercise prescription for aerobic and resistive training based on initial evaluation findings, risk stratification, comorbidities and participant's personal goals.       Expected Outcomes Achievement of increased cardiorespiratory fitness and enhanced flexibility, muscular endurance and strength shown through measurements of functional capacity and personal statement of participant.       Increase Strength and  Stamina Yes       Intervention Provide advice, education, support and counseling about physical activity/exercise needs.;Develop an individualized exercise prescription for aerobic and resistive training based on initial evaluation findings, risk stratification,  comorbidities and participant's personal goals.       Expected Outcomes Achievement of increased cardiorespiratory fitness and enhanced flexibility, muscular endurance and strength shown through measurements of functional capacity and personal statement of participant.          Exercise Goals Re-Evaluation :     Exercise Goals Re-Evaluation    Row Name 04/30/16 1537 05/02/16 1344 05/13/16 1527 05/27/16 1523 06/11/16 1359     Exercise Goal Re-Evaluation   Exercise Goals Review Increase Physical Activity;Increase Strenth and Stamina Increase Physical Activity;Increase Strenth and Stamina Increase Physical Activity;Increase Strenth and Stamina Increase Physical Activity;Increase Strenth and Stamina Increase Physical Activity;Increase Strenth and Stamina   Comments Satoshi is off to a good start in rehab.  He is now walking on the treadmill at 2.34mh versus the track.  He has also increased his stepper and ellipitcal to level 3.  We will continue to monitor Lot's progression. Reviewed home exercise with pt today.  Pt plans to walk and [facility] [employer].  Reviewed THR, pulse, RPE, sign and symptoms, and when to call 911 or MD.  Also discussed weather considerations and indoor options.  Pt voiced understanding. PGaetanohas been doing well in rehab.  He is going to the gym some, but he is trying to make it more of a routine.  He is up to 2.5 mph on the treadmill.  He is also getting his full 429m of exercise!  We will continue to monitor his progression. PaJamiesonontinues to do well in rehab.  He is now up to 3 METs on the T5.  We will continue to monitor his progression. PaMaliqueas been doing well in rehab.  He is feeling stronger overall.  He is  now up to level 4 on the elliptical and he has added some incline on the treadmill.  We will continue to monitor his progress.   Expected Outcomes Short: PaJadenill start to add incline to the treadmill.  Long: Continue to work on stIT sales professionalShort: Add in at least one day of exercise at the gym consistently.  Long: Become more independent at exercising on his own. Short: Continue to make exercise at the gym consistent.  Long: Continue to work on stIT sales professionalShort: Add incline to treadmill.  Long: Make exercise part of routine. Short: Continue to work on the incline on the treadmill.  Long: Continue to make exercise part of routine.   RoDu Pontame 06/26/16 1053             Exercise Goal Re-Evaluation   Exercise Goals Review Increase Physical Activity;Increase Strenth and Stamina       Comments PaKeithonontinues to do well in rehab.  He is feeling much better.  He is up to level 5 on the elliptical.  We will continue to monitor his progression.       Expected Outcomes Short: Continue to work on adding incline to treadmill.  Long: Continue to make exercise part of routine.          Discharge Exercise Prescription (Final Exercise Prescription Changes):     Exercise Prescription Changes - 06/26/16 1000      Response to Exercise   Blood Pressure (Admit) 124/74   Blood Pressure (Exercise) 144/68   Blood Pressure (Exit) 122/72   Heart Rate (Admit) 90 bpm   Heart Rate (Exercise) 122 bpm   Heart Rate (Exit) 104 bpm   Oxygen Saturation (Admit) 96 %   Oxygen Saturation (  Exercise) 94 %   Oxygen Saturation (Exit) 93 %   Rating of Perceived Exertion (Exercise) 13   Perceived Dyspnea (Exercise) 3   Duration Continue with 45 min of aerobic exercise without signs/symptoms of physical distress.   Intensity THRR unchanged     Progression   Progression Continue to progress workloads to maintain intensity without signs/symptoms of physical distress.   Average METs 2.95     Resistance  Training   Training Prescription Yes   Weight 6 lbs   Reps 10-15     Interval Training   Interval Training No     Treadmill   MPH 2.5   Grade 1   Minutes 15   METs 3.26     Elliptical   Level 5   Speed 3.3   Minutes 15     T5 Nustep   Level 7   SPM 63   Minutes 15   METs 3.1     Home Exercise Plan   Plans to continue exercise at [employer] (comment)  [employer]   Frequency Add 1 additional day to program exercise sessions.   Initial Home Exercises Provided 05/02/16      Nutrition:  Target Goals: Understanding of nutrition guidelines, daily intake of sodium '1500mg'$ , cholesterol '200mg'$ , calories 30% from fat and 7% or less from saturated fats, daily to have 5 or more servings of fruits and vegetables.  Biometrics:     Pre Biometrics - 04/14/16 1605      Pre Biometrics   Height 6' 0.4" (1.839 m)   Weight 238 lb 3.2 oz (108 kg)   Waist Circumference 43 inches   Hip Circumference 41 inches   Waist to Hip Ratio 1.05 %   BMI (Calculated) 32       Nutrition Therapy Plan and Nutrition Goals:   Nutrition Discharge: Rate Your Plate Scores:   Nutrition Goals Re-Evaluation:     Nutrition Goals Re-Evaluation    [place] Name 06/13/16 1219             Goals   Current Weight 238 lb (108 kg)       Nutrition Goal To cut back       Comment Daniell reports that he has cut out sugar and salt and is trying to eat more fruits. Darryel reports "I still eat alot but I am trying to eat healthier".          Nutrition Goals Discharge (Final Nutrition Goals Re-Evaluation):     Nutrition Goals Re-Evaluation - 06/13/16 1219      Goals   Current Weight 238 lb (108 kg)   Nutrition Goal To cut back   Comment Dashawn reports that he has cut out sugar and salt and is trying to eat more fruits. Kerrigan reports "I still eat alot but I am trying to eat healthier".      Psychosocial: Target Goals: Acknowledge presence or absence of significant depression and/or stress, maximize  coping skills, provide positive support system. Participant is able to verbalize types and ability to use techniques and skills needed for reducing stress and depression.   Initial Review & Psychosocial Screening:     Initial Psych Review & Screening - 04/14/16 1556      Family Dynamics   Good Support System? Yes   Comments Mr Roker has good support from his wife and children. 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 Also Mr  Colberg and his wife love to dance, but he is unable to dance at the level she has known before his shortness of breath. Mr Michelsen is looking forward to improving his stamina though his participation in [place].     Barriers   Psychosocial barriers to participate in program There are no identifiable barriers or psychosocial needs.;The patient should benefit from training in stress management and relaxation.     Screening Interventions   Interventions Encouraged to exercise;Program counselor consult      Quality of Life Scores:     Quality of Life - 04/14/16 1603      Quality of Life Scores   Health/Function Pre 20.81 %   Socioeconomic Pre 19.75 %   Psych/Spiritual Pre 20.86 %   Family Pre 21 %   GLOBAL Pre 20.61 %      PHQ-9: Recent Review Flowsheet Data    Depression screen [facility] 2/9 04/14/2016 01/29/2015 10/17/2014   Decreased Interest _0 Down, Depressed, Hopeless 1 0 0   PHQ - 2 Score _1 Altered sleeping 0 - -   Tired, decreased energy 2 - -   Change in appetite 0 - -   Feeling bad or failure about yourself  2 - -   Trouble concentrating 0 - -   Moving slowly or fidgety/restless 0 - -   Suicidal thoughts 0 - -   PHQ-9 Score 6 - -   Difficult doing work/chores Somewhat difficult - -     Interpretation of Total Score  Total Score Depression Severity:  1-4 = Minimal depression, 5-9 = Mild depression, 10-14 = Moderate depression, 15-19 = Moderately severe depression,  20-27 = Severe depression   Psychosocial Evaluation and Intervention:     Psychosocial Evaluation - 04/28/16 1208      Psychosocial Evaluation & Interventions   Comments Mr Hallenbeck Lazarz) has returned to this program subsequent to a lung infection several months ago.  Counselor met with him for an initial psychosocial evaluation.  He will be 61 years old in May and continues to have a strong support system with a spouse and his son who lives locally.  Ben has multiple health issues and is on medical disability as a result.  He sleeps approximately 6 hours each night with some interruption due to bathroom breaks.  He denies a history of depression or anxiety or current symptoms - although when he was recently sick he was more "down," which is to be expected.  Akram states he is generally in a positive mood and other than his health he has minimal stress in his life currently.  He has goals to breathe better; increase his stamina and strength; and lose more weight - although he proudly announced he has already lost 5 pounds over the past two weeks.  Counselor and staff will be following with Tania throughout the course of this program.     Expected Outcomes Idriss will exercise consistently to achieve his stated goals.  He will also meet with the dietician to help with his weight loss goals.     Continue Psychosocial Services  Follow up required by staff      Psychosocial Re-Evaluation:     Psychosocial Re-Evaluation    [place] Name 05/23/16 1252 06/13/16 1222 06/26/16 1044         Psychosocial Re-Evaluation   Current issues with  --  -- None Identified     Comments Mr Smiths 30 day psychosocial assessment  reveals no barriers to participation in Pulmonary Rehab. Psychosocial areas that are currently affecting patient's rehab experience include concerns about breathing and SOB. Patient does continue to exhibit positive coping skills to deal with illness and psychosocial concerns. Offered emotional  support and reassurance. Patient does feel he is making progress toward Pulmonary Rehab goals. Patient reports his health and activity level has improved in the past 30 days as evidenced by patient's report of increased ability to do work around his house. Patient states family/friends have noticed changes in his activity or mood. Patient reports feeling positive about current and projected progression in Pulmonary Rehab although he still struggles with SOB it is better. After reviewing the patient's treatment plan, the patient is making progress toward Pulmonary Rehab goals. Patient's rate of progress toward rehab goals is good. Plan of action to help patient continue to work towards rehab goals include continued smoking cessation, continued dietary changes and limiting salt and sugar intake, bieng compliant with medications and treatment plans to improve/maintain health and SOB. Will continue to monitor and evaluate progress toward psychosocial goal(s). Quince said he is feeling better since he quit smoking. Decklan reports that he will attend a dance event with his wife but can't compete in the actual dance with her "I don't have the breath for it".  Zayd is feeling good and sleeping good.  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  He is enjoying dancing with all the women and is able to keep up without getting too short of breath.   He has a positive outlook and feels that the program has worked well for him as he is able to do more at home.     Expected Outcomes  -- Cont. pulmonary healthier lifestyle. 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  Long: Continue to maintain positive outlook.     Interventions  --  -- Encouraged to attend Pulmonary Rehabilitation for the exercise     Continue Psychosocial Services   --  -- Follow up required by staff        Psychosocial Discharge (Final Psychosocial Re-Evaluation):     Psychosocial Re-Evaluation - 06/26/16 1044      Psychosocial Re-Evaluation    Current issues with None Identified   Comments Richy is feeling good and sleeping good.  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  He is enjoying dancing with all the women and is able to keep up without getting too short of breath.   He has a positive outlook and feels that the program has worked well for him as he is able to do more at home.   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  Long: Continue to maintain positive outlook.   Interventions Encouraged to attend Pulmonary Rehabilitation for the exercise   Continue Psychosocial Services  Follow up required by staff      Education: Education Goals: Education classes will be provided on a weekly basis, covering required topics. Participant will state understanding/return demonstration of topics presented.  Learning Barriers/Preferences:     Learning Barriers/Preferences - 04/14/16 1546      Learning Barriers/Preferences   Learning Barriers None   Learning Preferences None      Education Topics: Initial Evaluation Education: - Verbal, written and demonstration of respiratory meds, RPE/PD scales, oximetry and breathing techniques. Instruction on use of nebulizers and MDIs: cleaning and proper use, rinsing mouth with steroid doses and importance of monitoring MDI activations.   Pulmonary Rehab from 07/02/2016 in  [place] Cardiac and Pulmonary Rehab  Date  04/14/16  Educator  LB  Instruction Review Code  2- meets goals/outcomes      General Nutrition Guidelines/Fats and Fiber: -Group instruction provided by verbal, written material, models and posters to present the general guidelines for heart healthy nutrition. Gives an explanation and review of dietary fats and fiber.   Pulmonary Rehab from 07/02/2016 in [facility] Cardiac and Pulmonary Rehab  Date  06/30/16  Educator  CR  Instruction Review Code  2- meets goals/outcomes      Controlling Sodium/Reading Food Labels: -Group verbal and written material supporting  the discussion of sodium use in heart healthy nutrition. Review and explanation with models, verbal and written materials for utilization of the food label.   Pulmonary Rehab from 07/02/2016 in [facility] Cardiac and Pulmonary Rehab  Date  05/12/16  Educator  CR  Instruction Review Code  2- meets goals/outcomes      Exercise Physiology & Risk Factors: - Group verbal and written instruction with models to review the exercise physiology of the cardiovascular system and associated critical values. Details cardiovascular disease risk factors and the goals associated with each risk factor.   Aerobic Exercise & Resistance Training: - Gives group verbal and written discussion on the health impact of inactivity. On the components of aerobic and resistive training programs and the benefits of this training and how to safely progress through these programs.   Pulmonary Rehab from 02/05/2015 in [facility] Cardiac and Pulmonary Rehab  Date  01/31/15  Educator  SW  Instruction Review Code  2- meets goals/outcomes      Flexibility, Balance, General Exercise Guidelines: - Provides group verbal and written instruction on the benefits of flexibility and balance training programs. Provides general exercise guidelines with specific guidelines to those with heart or lung disease. Demonstration and skill practice provided.   Pulmonary Rehab from 07/02/2016 in [facility] Cardiac and Pulmonary Rehab  Date  06/06/16  Educator  AS  Instruction Review Code  2- meets goals/outcomes      Stress Management: - Provides group verbal and written instruction about the health risks of elevated stress, cause of high stress, and healthy ways to reduce stress.   Pulmonary Rehab from 07/02/2016 in [facility] Cardiac and Pulmonary Rehab  Date  07/02/16  Educator  [facility]  Instruction Review Code  2- meets goals/outcomes      Depression: - Provides group verbal and written instruction on the correlation between heart/lung disease and depressed  mood, treatment options, and the stigmas associated with seeking treatment.   Pulmonary Rehab from 07/02/2016 in [facility] Cardiac and Pulmonary Rehab  Date  06/04/16  Educator  [facility]  Instruction Review Code  2- meets goals/outcomes      Exercise & Equipment Safety: - Individual verbal instruction and demonstration of equipment use and safety with use of the equipment.   Pulmonary Rehab from 07/02/2016 in [facility] Cardiac and Pulmonary Rehab  Date  04/21/16  Educator  AS  Instruction Review Code  2- meets goals/outcomes      Infection Prevention: - Provides verbal and written material to individual with discussion of infection control including proper hand washing and proper equipment cleaning during exercise session.   Pulmonary Rehab from 07/02/2016 in [facility] Cardiac and Pulmonary Rehab  Date  04/21/16  Educator  AS  Instruction Review Code  2- meets goals/outcomes      Falls Prevention: - Provides verbal and written material to individual with discussion of falls prevention and safety.   Pulmonary  Rehab from 02/05/2015 in [facility] Cardiac and Pulmonary Rehab  Date  10/17/14  Educator  LB  Instruction Review Code  2- meets goals/outcomes      Diabetes: - Individual verbal and written instruction to review signs/symptoms of diabetes, desired ranges of glucose level fasting, after meals and with exercise. Advice that pre and post exercise glucose checks will be done for 3 sessions at entry of program.   Chronic Lung Diseases: - Group verbal and written instruction to review new updates, new respiratory medications, new advancements in procedures and treatments. Provide informative websites and "800" numbers of self-education.   Pulmonary Rehab from 02/05/2015 in [facility] Cardiac and Pulmonary Rehab  Date  12/15/14 [facility]]  Educator  CE  Instruction Review Code  2- meets goals/outcomes      Lung Procedures: - Group verbal and written instruction to describe testing methods done to  diagnose lung disease. Review the outcome of test results. Describe the treatment choices: Pulmonary Function Tests, ABGs and oximetry.   Pulmonary Rehab from 02/05/2015 in [facility] Cardiac and Pulmonary Rehab  Date  11/10/14  Educator  [place]  Instruction Review Code  2- meets goals/outcomes      Energy Conservation: - Provide group verbal and written instruction for methods to conserve energy, plan and organize activities. Instruct on pacing techniques, use of adaptive equipment and posture/positioning to relieve shortness of breath.   Pulmonary Rehab from 07/02/2016 in [facility] Cardiac and Pulmonary Rehab  Date  06/18/16  Educator  [facility]  Instruction Review Code  2- meets goals/outcomes      Triggers: - Group verbal and written instruction to review types of environmental controls: home humidity, furnaces, filters, dust mite/pet prevention, HEPA vacuums. To discuss weather changes, air quality and the benefits of nasal washing.   Pulmonary Rehab from 07/02/2016 in [facility] Cardiac and Pulmonary Rehab  Date  04/30/16  Educator  LB  Instruction Review Code  2- meets goals/outcomes      Exacerbations: - Group verbal and written instruction to provide: warning signs, infection symptoms, calling MD promptly, preventive modes, and value of vaccinations. Review: effective airway clearance, coughing and/or vibration techniques. Create an Sports administrator.   Pulmonary Rehab from 07/02/2016 in [facility] Cardiac and Pulmonary Rehab  Date  04/23/16  Educator  LV  Instruction Review Code  2- meets goals/outcomes      Oxygen: - Individual and group verbal and written instruction on oxygen therapy. Includes supplement oxygen, available portable oxygen systems, continuous and intermittent flow rates, oxygen safety, concentrators, and Medicare reimbursement for oxygen.   Respiratory Medications: - Group verbal and written instruction to review medications for lung disease. Drug class, frequency, complications, importance  of spacers, rinsing mouth after steroid MDI's, and proper cleaning methods for nebulizers.   Pulmonary Rehab from 02/05/2015 in [facility] Cardiac and Pulmonary Rehab  Date  10/17/14  Educator  LB  Instruction Review Code  2- meets goals/outcomes      AED/CPR: - Group verbal and written instruction with the use of models to demonstrate the basic use of the AED with the basic ABC's of resuscitation.   Pulmonary Rehab from 07/02/2016 in [facility] Cardiac and Pulmonary Rehab  Date  06/20/16  Educator  CE  Instruction Review Code  2- meets goals/outcomes      Breathing Retraining: - Provides individuals verbal and written instruction on purpose, frequency, and proper technique of diaphragmatic breathing and pursed-lipped breathing. Applies individual practice skills.   Pulmonary Rehab from 02/05/2015 in [facility] Cardiac and  Pulmonary Rehab  Date  10/20/14  Educator  SJ  Instruction Review Code  2- meets goals/outcomes      Anatomy and Physiology of the Lungs: - Group verbal and written instruction with the use of models to provide basic lung anatomy and physiology related to function, structure and complications of lung disease.   Pulmonary Rehab from 07/02/2016 in [facility] Cardiac and Pulmonary Rehab  Date  06/11/16  Educator  LB  Instruction Review Code  2- meets goals/outcomes      Heart Failure: - Group verbal and written instruction on the basics of heart failure: signs/symptoms, treatments, explanation of ejection fraction, enlarged heart and cardiomyopathy.   Pulmonary Rehab from 07/02/2016 in [facility] Cardiac and Pulmonary Rehab  Date  05/23/16  Educator  CE  Instruction Review Code  2- meets goals/outcomes      Sleep Apnea: - Individual verbal and written instruction to review Obstructive Sleep Apnea. Review of risk factors, methods for diagnosing and types of masks and machines for OSA.   Anxiety: - Provides group, verbal and written instruction on the correlation between heart/lung  disease and anxiety, treatment options, and management of anxiety.   Pulmonary Rehab from 07/02/2016 in [facility] Cardiac and Pulmonary Rehab  Date  07/02/16  Educator  [facility]  Instruction Review Code  2- Meets goals/outcomes      Relaxation: - Provides group, verbal and written instruction about the benefits of relaxation for patients with heart/lung disease. Also provides patients with examples of relaxation techniques.   Pulmonary Rehab from 07/02/2016 in [facility] Cardiac and Pulmonary Rehab  Date  05/07/16  Educator  [facility]  Instruction Review Code  2- Meets goals/outcomes      Knowledge Questionnaire Score:     Knowledge Questionnaire Score - 04/14/16 1546      Knowledge Questionnaire Score   Pre Score 10/10       Core Components/Risk Factors/Patient Goals at Admission:     Personal Goals and Risk Factors at Admission - 04/14/16 1551      Core Components/Risk Factors/Patient Goals on Admission    Weight Management Yes   Intervention Weight Management: Develop a combined nutrition and exercise program designed to reach desired caloric intake, while maintaining appropriate intake of nutrient and fiber, sodium and fats, and appropriate energy expenditure required for the weight goal.;Weight Management: Provide education and appropriate resources to help participant work on and attain dietary goals.   Admit Weight 238 lb 3.2 oz (108 kg)   Goal Weight: Short Term 233 lb (105.7 kg)   Goal Weight: Long Term 210 lb (95.3 kg)   Expected Outcomes Short Term: Continue to assess and modify interventions until short term weight is achieved;Long Term: Adherence to nutrition and physical activity/exercise program aimed toward attainment of established weight goal;Weight Loss: Understanding of general recommendations for a balanced deficit meal plan, which promotes 1-2 lb weight loss per week and includes a negative energy balance of [phone] kcal/d;Understanding of distribution of calorie intake throughout  the day with the consumption of 4-5 meals/snacks;Understanding recommendations for meals to include 15-35% energy as protein, 25-35% energy from fat, 35-60% energy from carbohydrates, less than '200mg'$  of dietary cholesterol, 20-35 gm of total fiber daily   Tobacco Cessation Yes   Number of packs per day Mr Sokolowski's quit date was 07/2015. Occasionally he has a cigarette, but he is now nauseated when he smokes one.   Improve shortness of breath with ADL's Yes   Intervention Provide education, individualized exercise plan and daily activity  instruction to help decrease symptoms of SOB with activities of daily living.   Expected Outcomes Short Term: Achieves a reduction of symptoms when performing activities of daily living.   Develop more efficient breathing techniques such as purse lipped breathing and diaphragmatic breathing; and practicing self-pacing with activity Yes   Intervention Provide education, demonstration and support about specific breathing techniuqes utilized for more efficient breathing. Include techniques such as pursed lipped breathing, diaphragmatic breathing and self-pacing activity.   Expected Outcomes Short Term: Participant will be able to demonstrate and use breathing techniques as needed throughout daily activities.   Increase knowledge of respiratory medications and ability to use respiratory devices properly  Yes  Spiriva, Symbicort, albuterol MDI - uses spacer.   Intervention Provide education and demonstration as needed of appropriate use of medications, inhalers, and oxygen therapy.   Expected Outcomes Short Term: Achieves understanding of medications use. Understands that oxygen is a medication prescribed by physician. Demonstrates appropriate use of inhaler and oxygen therapy.   Hypertension Yes   Intervention Provide education on lifestyle modifcations including regular physical activity/exercise, weight management, moderate sodium restriction and increased consumption of  fresh fruit, vegetables, and low fat dairy, alcohol moderation, and smoking cessation.;Monitor prescription use compliance.   Expected Outcomes Short Term: Continued assessment and intervention until BP is < 140/64m HG in hypertensive participants. < 130/82mHG in hypertensive participants with diabetes, heart failure or chronic kidney disease.;Long Term: Maintenance of blood pressure at goal levels.   Lipids Yes   Intervention Provide education and support for participant on nutrition & aerobic/resistive exercise along with prescribed medications to achieve LDL '70mg'$ , HDL >'40mg'$ .   Expected Outcomes Short Term: Participant states understanding of desired cholesterol values and is compliant with medications prescribed. Participant is following exercise prescription and nutrition guidelines.;Long Term: Cholesterol controlled with medications as prescribed, with individualized exercise RX and with personalized nutrition plan. Value goals: LDL < '70mg'$ , HDL > 40 mg.      Core Components/Risk Factors/Patient Goals Review:      Goals and Risk Factor Review    Row Name 04/29/16 0681195/[id number] 1102 05/23/16 1210 06/13/16 1220       Core Components/Risk Factors/Patient Goals Review   Personal Goals Review Weight Management/Obesity;Improve shortness of breath with ADL's;Increase knowledge of respiratory medications and ability to use respiratory devices properly.;Develop more efficient breathing techniques such as purse lipped breathing and diaphragmatic breathing and practicing self-pacing with activity.;Tobacco Cessation;Lipids Weight Management/Obesity;Improve shortness of breath with ADL's;Increase knowledge of respiratory medications and ability to use respiratory devices properly.;Develop more efficient breathing techniques such as purse lipped breathing and diaphragmatic breathing and practicing self-pacing with activity.;Lipids Weight Management/Obesity;Tobacco Cessation;Improve shortness of breath with  ADL's;Increase knowledge of respiratory medications and ability to use respiratory devices properly.;Develop more efficient breathing techniques such as purse lipped breathing and diaphragmatic breathing and practicing self-pacing with activity.;Lipids Weight Management/Obesity;Hypertension    Review Mr SmHoglundas completed 4 sessions of [facility]. He continues to use PLB with good technique and continues to pace with his exercise and activities. His smoking cessation is going well - no cigarrettes for several weeks. He has loss 5lbs since starting the program and is working on a healthier diet. Mr SmMontanoas a good understanding of his MDI's. For now, he is trying a new inhaler, Stiolta, instead of his Symbicort. He seems to notice an improved difference with this inhaler. His  exercise goal is to dance 45m14m of shag with his wife with some modification to the dance. Mr smidaniello  is compliant with his lipid medication.  -- Mr. Rowland has completed 13 sessions of [facility]. He is using PLB with good technique but states while his SOB with ADL's is slightly improved, it is still a noticable struggle for him. He continues with smoking cessation since quiting. He stats while he initially lost 5 lbs he has gained 3 back and that  he is working hard to better his diet, limiting sodium and sugar intake. He works out at least 2 other days/week at a gym for no less than 45 minuts-1hour. While his weight has gone back up he states he has moved down a notch on his belt , so that is encouraging for him. His lipids were drawn last week and all his numbers have improved. He will now be on a 6 month recheck . Mr. Demeyer states he has a good understanding of his respiratory medications, MDI's and his new inhaler Doran Stabler is still working well.  Acxel reports that he lost weight but has gained some back at 238lbs today but he has taken in a belt look. Jerrol said he is unable to do the dance competitions with his wife but will attend with her  this weekend and get a lot of exercise. Eirik said he has quit smoking and "I am even trying to talk the person next to me to quit smoking in [place]". Pauls' blood pressure was good today at 128/70. Raun's MD check his lipid blood work     Expected Outcomes Continue progressing with his exercise goals and maintaining total smoking cessation.  -- Continue progressing with exercise, weight loss goals and smoking cessation, and reduction of lipid levels.  Cont to not smoke, cont to cut out sugar and salt and eat healthier.        Core Components/Risk Factors/Patient Goals at Discharge (Final Review):      Goals and Risk Factor Review - 06/13/16 1220      Core Components/Risk Factors/Patient Goals Review   Personal Goals Review Weight Management/Obesity;Hypertension   Review Ilay reports that he lost weight but has gained some back at 238lbs today but he has taken in a belt look. Fadel said he is unable to do the dance competitions with his wife but will attend with her this weekend and get a lot of exercise. Trejan said he has quit smoking and "I am even trying to talk the person next to me to quit smoking in [place]". Pauls' blood pressure was good today at 128/70. Deon's MD check his lipid blood work    Expected Outcomes Cont to not smoke, cont to cut out sugar and salt and eat healthier.       ITP Comments:     ITP Comments    Row Name 04/25/16 1238 05/12/16 0912 05/12/16 1430 06/09/16 0843 07/07/16 0808   ITP Comments [facility] 04/25/16 CE 30 day note review with Dr Emily Filbert, Medical Director of [facility] [name] did not complete his rehab session due to illness - head congestion and sore throat. Recommended he call his physician for an antibiotic prescription. 30 day note review by Dr Emily Filbert, Medical Director of [place] 30 day note review with Dr Emily Filbert, Medical Director of [facility]      Comments: 30 day note review with Dr Emily Filbert, Medical Director of  [place]

## 2016-07-11 ENCOUNTER — Encounter: Payer: Medicaid Other | Admitting: *Deleted

## 2016-07-11 VITALS — Ht 72.4 in | Wt 239.0 lb

## 2016-07-11 DIAGNOSIS — J449 Chronic obstructive pulmonary disease, unspecified: Secondary | ICD-10-CM

## 2016-07-11 NOTE — Progress Notes (Signed)
Daily Session Note  Patient Details  Name: Nathaniel [name] MRN: [id number] Date of Birth: 1955-06-28 Referring Provider:     Pulmonary Rehab from 04/14/2016 in [facility] Cardiac and Pulmonary Rehab  Referring Provider  Jeronimo Greaves MD      Encounter Date: 07/11/2016  Check In:     Session Check In - 07/11/16 1134      Check-In   Location [facility]-Cardiac & Pulmonary Rehab   Staff Present Renita Papa, RN BSN;Joseph Darrin Nipper, [place], ACSM RCEP, Exercise Physiologist   Supervising physician immediately available to respond to emergencies [facility] immediately available ER MD   Physician(s) Dr. Jimmye Norman and Joni Fears   Medication changes reported     No   Fall or balance concerns reported    No   Warm-up and Cool-down Performed as group-led instruction   Resistance Training Performed Yes   VAD Patient? No     Pain Assessment   Currently in Pain? No/denies         History  Smoking Status  . Former Smoker  . Years: 45.00  . Types: Cigarettes  . Quit date: 07/2015  Smokeless Tobacco  . Never Used    Comment: no cigarettes currently    Goals Met:  Proper associated with RPD/PD & O2 Sat Independence with exercise equipment Using PLB without cueing & demonstrates good technique Exercise tolerated well Personal goals reviewed Strength training completed today  Goals Unmet:  Not Applicable  Comments:      [place] Name 04/14/16 1600 06/04/16 1239 07/11/16 1222     6 Minute Walk   Phase Initial Mid Program Discharge   Distance 1000 feet 1480 feet 1490 feet   Distance % Change  - 48 %  480 ft 49 %  490 ft   Walk Time 6 minutes 56 minutes 6 minutes   # of Rest Breaks 0 0 0   MPH 1.89 2.8 2.82   METS 2.95 3.82 3.78   RPE 11 12 12    Perceived Dyspnea  2 2 2    VO2 Peak 10.33 13.38 13.22   Symptoms Yes (comment) No No   Comments left knee pain 2/10  -  -   Resting HR 101 bpm 84 bpm 84 bpm   Resting BP 126/64 124/70 136/63   Max Ex.  HR 110 bpm 108 bpm 108 bpm   Max Ex. BP 136/70 146/74 136/70   2 Minute Post BP 136/66 126/70 124/70     Interval HR   Baseline HR 101 84 84   1 Minute HR 107 101 97   2 Minute HR 105 107 97   3 Minute HR 106 108 100   4 Minute HR 106 107 103   5 Minute HR 110 108 102   6 Minute HR 106 107 108   2 Minute Post HR 95 98 84   Interval Heart Rate? Yes Yes Yes     Interval Oxygen   Interval Oxygen? Yes Yes Yes   Baseline Oxygen Saturation % 96 % 98 % 94 %   Baseline Liters of Oxygen 0 L  Room Air 0 L  Room Air 0 L  Room Air   1 Minute Oxygen Saturation % 96 % 97 % 96 %   1 Minute Liters of Oxygen 0 L 0 L 0 L   2 Minute Oxygen Saturation % 96 % 96 % 95 %   2 Minute Liters of Oxygen 0 L 0 L  0 L   3 Minute Oxygen Saturation % 97 % 97 % 96 %   3 Minute Liters of Oxygen 0 L 0 L 0 L   4 Minute Oxygen Saturation % 96 % 96 % 95 %   4 Minute Liters of Oxygen 0 L 0 L 0 L   5 Minute Oxygen Saturation % 97 % 96 % 95 %   5 Minute Liters of Oxygen 0 L 0 L 0 L   6 Minute Oxygen Saturation % 96 % 96 % 95 %   6 Minute Liters of Oxygen 0 L 0 L 0 L   2 Minute Post Oxygen Saturation % 97 % 98 % 96 %   2 Minute Post Liters of Oxygen 0 L 0 L 0 L     Pt able to follow exercise prescription today without complaint.  Will continue to monitor for progression.    Dr. Emily Filbert is Medical Director for [place] and [facility] Pulmonary Rehabilitation.

## 2016-07-16 ENCOUNTER — Encounter: Payer: Medicaid Other | Admitting: *Deleted

## 2016-07-16 DIAGNOSIS — J449 Chronic obstructive pulmonary disease, unspecified: Secondary | ICD-10-CM | POA: Diagnosis not present

## 2016-07-16 NOTE — Progress Notes (Signed)
Daily Session Note  Patient Details  Name: Nathaniel [name] MRN: [id number] Date of Birth: [birth year] Referring Provider:     Pulmonary Rehab from 04/14/2016 in [facility] Cardiac and Pulmonary Rehab  Referring Provider  Jeronimo Greaves MD      Encounter Date: 07/16/2016  Check In:     Session Check In - 07/16/16 1200      Check-In   Location [facility]-Cardiac & Pulmonary Rehab   Staff Present Nyoka Cowden, RN, BSN, MA;Meredith Sherryll Burger, RN BSN;Joseph Flavia Shipper   Supervising physician immediately available to respond to emergencies [facility] immediately available ER MD   Physician(s) Dr. Kerman Passey and Quentin Cornwall   Medication changes reported     No   Fall or balance concerns reported    No   Warm-up and Cool-down Performed as group-led instruction   Resistance Training Performed Yes   VAD Patient? No     Pain Assessment   Currently in Pain? No/denies         History  Smoking Status  . Former Smoker  . Years: 45.00  . Types: Cigarettes  . Quit date: 07/2015  Smokeless Tobacco  . Never Used    Comment: no cigarettes currently    Goals Met:  Proper associated with RPD/PD & O2 Sat Independence with exercise equipment Using PLB without cueing & demonstrates good technique Exercise tolerated well Strength training completed today  Goals Unmet:  Not Applicable  Comments: Pt able to follow exercise prescription today without complaint.  Will continue to monitor for progression.    Dr. Emily Filbert is Medical Director for [place] and [facility].

## 2016-07-18 ENCOUNTER — Encounter: Payer: Medicaid Other | Admitting: *Deleted

## 2016-07-18 DIAGNOSIS — J449 Chronic obstructive pulmonary disease, unspecified: Secondary | ICD-10-CM

## 2016-07-18 NOTE — Progress Notes (Signed)
Daily Session Note  Patient Details  Name: Nathaniel [name] MRN: [id number] Date of Birth: Dec 27, 1955 Referring Provider:     Pulmonary Rehab from 04/14/2016 in [facility] Cardiac and Pulmonary Rehab  Referring Provider  Jeronimo Greaves MD      Encounter Date: 07/18/2016  Check In:     Session Check In - 07/18/16 1138      Check-In   Location [facility]-Cardiac & Pulmonary Rehab   Staff Present Nyoka Cowden, RN, BSN, MA;Johathan Province Sherryll Burger, RN BSN;Joseph Flavia Shipper   Supervising physician immediately available to respond to emergencies [facility] immediately available ER MD   Physician(s) Dr. Archie Balboa and Corky Downs   Medication changes reported     No   Fall or balance concerns reported    No   Warm-up and Cool-down Performed as group-led instruction   Resistance Training Performed Yes   VAD Patient? No     Pain Assessment   Currently in Pain? No/denies         History  Smoking Status  . Former Smoker  . Years: 45.00  . Types: Cigarettes  . Quit date: 07/2015  Smokeless Tobacco  . Never Used    Comment: no cigarettes currently    Goals Met:  Proper associated with RPD/PD & O2 Sat Independence with exercise equipment Using PLB without cueing & demonstrates good technique Exercise tolerated well Strength training completed today  Goals Unmet:  Not Applicable  Comments: Pt able to follow exercise prescription today without complaint.  Will continue to monitor for progression.    Dr. Emily Filbert is Medical Director for [place] and [facility].

## 2016-07-21 DIAGNOSIS — J449 Chronic obstructive pulmonary disease, unspecified: Secondary | ICD-10-CM | POA: Diagnosis not present

## 2016-07-21 NOTE — Progress Notes (Signed)
Daily Session Note  Patient Details  Name: Nathaniel [name] MRN: [id number] Date of Birth: February 04, 1955 Referring Provider:     Pulmonary Rehab from 04/14/2016 in [facility] Cardiac and Pulmonary Rehab  Referring Provider  Jeronimo Greaves MD      Encounter Date: 07/21/2016  Check In:     Session Check In - 07/21/16 1152      Check-In   Location [facility]-Cardiac & Pulmonary Rehab   Staff Present Nada Maclachlan, BA, ACSM CEP, Exercise Physiologist;Kelly Amedeo Plenty, BS, ACSM CEP, Exercise Physiologist;Conley Delisle Flavia Shipper   Supervising physician immediately available to respond to emergencies [facility] immediately available ER MD   Physician(s) Dr. Alfred Levins and [facility]   Medication changes reported     No   Fall or balance concerns reported    No   Tobacco Cessation No Change   Warm-up and Cool-down Performed as group-led instruction   Resistance Training Performed Yes   VAD Patient? No     Pain Assessment   Currently in Pain? No/denies   Multiple Pain Sites No         History  Smoking Status  . Former Smoker  . Years: 45.00  . Types: Cigarettes  . Quit date: 07/2015  Smokeless Tobacco  . Never Used    Comment: no cigarettes currently    Goals Met:  Proper associated with RPD/PD & O2 Sat Independence with exercise equipment Using PLB without cueing & demonstrates good technique Exercise tolerated well Strength training completed today  Goals Unmet:  Not Applicable  Comments: Pt able to follow exercise prescription today without complaint.  Will continue to monitor for progression.   Dr. Emily Filbert is Medical Director for [place] and [facility].

## 2016-07-22 NOTE — Patient Instructions (Signed)
Discharge Progress Report  Patient Details  Name: Nathaniel [name] MRN: [id number] Date of Birth: 1955-05-28 Referring Provider:  Tawny Asal, MD   Number of Visits: 36/36  Reason for Discharge:  Patient reached a stable level of exercise. Patient independent in their exercise.  Smoking History:  History  Smoking Status  . Former Smoker  . Years: 45.00  . Types: Cigarettes  . Quit date: 07/2015  Smokeless Tobacco  . Never Used    Comment: no cigarettes currently    Diagnosis:  Chronic obstructive pulmonary disease, unspecified COPD type (HCC)  COPD, moderate ([place])  Initial Exercise Prescription:     Initial Exercise Prescription - 04/14/16 1600      Date of Initial Exercise RX and Referring Provider   Date 04/14/16   Referring Provider Mock, Corene Cornea MD     Elliptical   Level 1   Speed 2.5   Minutes 15     T5 Nustep   Level 3   SPM 80   Minutes 15   METs 2     Track   Laps 25   Minutes 15   METs 2.15     Prescription Details   Frequency (times per week) 3   Duration Progress to 45 minutes of aerobic exercise without signs/symptoms of physical distress     Intensity   THRR 40-80% of Max Heartrate 125-150   Ratings of Perceived Exertion 11-13   Perceived Dyspnea 0-4     Progression   Progression Continue to progress workloads to maintain intensity without signs/symptoms of physical distress.     Resistance Training   Training Prescription Yes   Weight 3 lbs   Reps 10-15      Discharge Exercise Prescription (Final Exercise Prescription Changes):     Exercise Prescription Changes - 07/22/16 1400      Response to Exercise   Blood Pressure (Admit) 130/72   Blood Pressure (Exercise) 162/82   Blood Pressure (Exit) 120/78   Heart Rate (Admit) 83 bpm   Heart Rate (Exercise) 112 bpm   Heart Rate (Exit) 94 bpm   Oxygen Saturation (Admit) 94 %   Oxygen Saturation (Exercise) 93 %   Oxygen Saturation (Exit) 95 %   Rating of Perceived Exertion  (Exercise) 13   Perceived Dyspnea (Exercise) 2   Symptoms none   Duration Continue with 45 min of aerobic exercise without signs/symptoms of physical distress.   Intensity THRR unchanged     Progression   Progression Continue to progress workloads to maintain intensity without signs/symptoms of physical distress.   Average METs 3.3     Resistance Training   Training Prescription Yes   Weight 7 lbs   Reps 10-15     Interval Training   Interval Training No     Treadmill   MPH 2.5   Grade 0   Minutes 15   METs 2.91     Elliptical   Level 5   Speed 3.3   Minutes 15     T5 Nustep   Level 5   SPM 91   Minutes 15   METs 3.4     Home Exercise Plan   Plans to continue exercise at [employer] (comment)  [employer]   Frequency Add 2 additional days to program exercise sessions.   Initial Home Exercises Provided 05/02/16      Functional Capacity:     6 Minute Walk    Row Name 04/14/16 1600 06/04/16 1239 07/11/16 1222  6 Minute Walk   Phase Initial Mid Program Discharge   Distance 1000 feet 1480 feet 1490 feet   Distance % Change  - 48 %  480 ft 49 %  490 ft   Walk Time 6 minutes 56 minutes 6 minutes   # of Rest Breaks 0 0 0   MPH 1.89 2.8 2.82   METS 2.95 3.82 3.78   RPE 11 12 12    Perceived Dyspnea  2 2 2    VO2 Peak 10.33 13.38 13.22   Symptoms Yes (comment) No No   Comments left knee pain 2/10  -  -   Resting HR 101 bpm 84 bpm 84 bpm   Resting BP 126/64 124/70 136/63   Max Ex. HR 110 bpm 108 bpm 108 bpm   Max Ex. BP 136/70 146/74 136/70   2 Minute Post BP 136/66 126/70 124/70     Interval HR   Baseline HR 101 84 84   1 Minute HR 107 101 97   2 Minute HR 105 107 97   3 Minute HR 106 108 100   4 Minute HR 106 107 103   5 Minute HR 110 108 102   6 Minute HR 106 107 108   2 Minute Post HR 95 98 84   Interval Heart Rate? Yes Yes Yes     Interval Oxygen   Interval Oxygen? Yes Yes Yes   Baseline Oxygen Saturation % 96 % 98 % 94 %   Baseline  Liters of Oxygen 0 L  Room Air 0 L  Room Air 0 L  Room Air   1 Minute Oxygen Saturation % 96 % 97 % 96 %   1 Minute Liters of Oxygen 0 L 0 L 0 L   2 Minute Oxygen Saturation % 96 % 96 % 95 %   2 Minute Liters of Oxygen 0 L 0 L 0 L   3 Minute Oxygen Saturation % 97 % 97 % 96 %   3 Minute Liters of Oxygen 0 L 0 L 0 L   4 Minute Oxygen Saturation % 96 % 96 % 95 %   4 Minute Liters of Oxygen 0 L 0 L 0 L   5 Minute Oxygen Saturation % 97 % 96 % 95 %   5 Minute Liters of Oxygen 0 L 0 L 0 L   6 Minute Oxygen Saturation % 96 % 96 % 95 %   6 Minute Liters of Oxygen 0 L 0 L 0 L   2 Minute Post Oxygen Saturation % 97 % 98 % 96 %   2 Minute Post Liters of Oxygen 0 L 0 L 0 L      Quality of Life:     Quality of Life - 07/16/16 1352      Quality of Life Scores   Health/Function Pre 20.81 %   Health/Function Post 17.25 %   Health/Function % Change -17.11 %   Socioeconomic Pre 19.75 %   Socioeconomic Post 24.25 %   Socioeconomic % Change  22.78 %   Psych/Spiritual Pre 20.86 %   Psych/Spiritual Post 25 %   Psych/Spiritual % Change 19.85 %   Family Pre 21 %   Family Post 28.8 %   Family % Change 37.14 %   GLOBAL Pre 20.61 %   GLOBAL Post 21.68 %   GLOBAL % Change 5.19 %      Personal Goals: Goals established at orientation with interventions provided to work toward  goal.     Personal Goals and Risk Factors at Admission - 04/14/16 1551      Core Components/Risk Factors/Patient Goals on Admission    Weight Management Yes   Intervention Weight Management: Develop a combined nutrition and exercise program designed to reach desired caloric intake, while maintaining appropriate intake of nutrient and fiber, sodium and fats, and appropriate energy expenditure required for the weight goal.;Weight Management: Provide education and appropriate resources to help participant work on and attain dietary goals.   Admit Weight 238 lb 3.2 oz (108 kg)   Goal Weight: Short Term 233 lb (105.7 kg)    Goal Weight: Long Term 210 lb (95.3 kg)   Expected Outcomes Short Term: Continue to assess and modify interventions until short term weight is achieved;Long Term: Adherence to nutrition and physical activity/exercise program aimed toward attainment of established weight goal;Weight Loss: Understanding of general recommendations for a balanced deficit meal plan, which promotes 1-2 lb weight loss per week and includes a negative energy balance of [phone] kcal/d;Understanding of distribution of calorie intake throughout the day with the consumption of 4-5 meals/snacks;Understanding recommendations for meals to include 15-35% energy as protein, 25-35% energy from fat, 35-60% energy from carbohydrates, less than 200mg  of dietary cholesterol, 20-35 gm of total fiber daily   Tobacco Cessation Yes   Number of packs per day Nathaniel [name] quit date was 07/2015. Occasionally he has a cigarette, but he is now nauseated when he smokes one.   Improve shortness of breath with ADL's Yes   Intervention Provide education, individualized exercise plan and daily activity instruction to help decrease symptoms of SOB with activities of daily living.   Expected Outcomes Short Term: Achieves a reduction of symptoms when performing activities of daily living.   Develop more efficient breathing techniques such as purse lipped breathing and diaphragmatic breathing; and practicing self-pacing with activity Yes   Intervention Provide education, demonstration and support about specific breathing techniuqes utilized for more efficient breathing. Include techniques such as pursed lipped breathing, diaphragmatic breathing and self-pacing activity.   Expected Outcomes Short Term: Participant will be able to demonstrate and use breathing techniques as needed throughout daily activities.   Increase knowledge of respiratory medications and ability to use respiratory devices properly  Yes  Spiriva, Symbicort, albuterol MDI - uses spacer.    Intervention Provide education and demonstration as needed of appropriate use of medications, inhalers, and oxygen therapy.   Expected Outcomes Short Term: Achieves understanding of medications use. Understands that oxygen is a medication prescribed by physician. Demonstrates appropriate use of inhaler and oxygen therapy.   Hypertension Yes   Intervention Provide education on lifestyle modifcations including regular physical activity/exercise, weight management, moderate sodium restriction and increased consumption of fresh fruit, vegetables, and low fat dairy, alcohol moderation, and smoking cessation.;Monitor prescription use compliance.   Expected Outcomes Short Term: Continued assessment and intervention until BP is < 140/37mm HG in hypertensive participants. < 130/17mm HG in hypertensive participants with diabetes, heart failure or chronic kidney disease.;Long Term: Maintenance of blood pressure at goal levels.   Lipids Yes   Intervention Provide education and support for participant on nutrition & aerobic/resistive exercise along with prescribed medications to achieve LDL 70mg , HDL >40mg .   Expected Outcomes Short Term: Participant states understanding of desired cholesterol values and is compliant with medications prescribed. Participant is following exercise prescription and nutrition guidelines.;Long Term: Cholesterol controlled with medications as prescribed, with individualized exercise RX and with personalized nutrition plan. Value goals: LDL < 70mg ,  HDL > 40 mg.       Personal Goals Discharge:     Goals and Risk Factor Review - 06/13/16 1220      Core Components/Risk Factors/Patient Goals Review   Personal Goals Review Weight Management/Obesity;Hypertension   Review Nathaniel [name] reports that he lost weight but has gained some back at 238lbs today but he has taken in a belt look. Nathaniel [name] said he is unable to do the dance competitions with his wife but will attend with her this weekend and get a  lot of exercise. Nathaniel [name] said he has quit smoking and "I am even trying to talk the person next to me to quit smoking in [place]". Pauls' blood pressure was good today at 128/70. Nathaniel [name]'s MD check his lipid blood work    Expected Outcomes Cont to not smoke, cont to cut out sugar and salt and eat healthier.       Nutrition & Weight - Outcomes:     Pre Biometrics - 04/14/16 1605      Pre Biometrics   Height 6' 0.4" (1.839 m)   Weight 238 lb 3.2 oz (108 kg)   Waist Circumference 43 inches   Hip Circumference 41 inches   Waist to Hip Ratio 1.05 %   BMI (Calculated) 32         Post Biometrics - 07/11/16 1224       Post  Biometrics   Height 6' 0.4" (1.839 m)   Weight 239 lb (108.4 kg)   Waist Circumference 44 inches   Hip Circumference 43 inches   Waist to Hip Ratio 1.02 %   BMI (Calculated) 32.1      Nutrition:   Nutrition Discharge:   Education Questionnaire Score:     Knowledge Questionnaire Score - 07/16/16 1351      Knowledge Questionnaire Score   Pre Score 10/10   Post Score 10/10      Goals reviewed with patient; copy given to patient.

## 2016-07-23 ENCOUNTER — Encounter: Payer: Medicaid Other | Admitting: *Deleted

## 2016-07-23 DIAGNOSIS — J449 Chronic obstructive pulmonary disease, unspecified: Secondary | ICD-10-CM

## 2016-07-23 NOTE — Progress Notes (Signed)
Daily Session Note  Patient Details  Name: Nathaniel [name] MRN: [id number] Date of Birth: Aug 03, 1955 Referring Provider:     Pulmonary Rehab from 04/14/2016 in [facility] Cardiac and Pulmonary Rehab  Referring Provider  Nathaniel Greaves MD      Encounter Date: 07/23/2016  Check In:     Session Check In - 07/23/16 1131      Check-In   Location [facility]-Cardiac & Pulmonary Rehab   Staff Present Alberteen Sam, MA, ACSM RCEP, Exercise Physiologist;Meredith Sherryll Burger, RN BSN;Joseph Flavia Shipper   Supervising physician immediately available to respond to emergencies [facility] immediately available ER MD   Physician(s) Drs. Lord and [employer]   Medication changes reported     No   Fall or balance concerns reported    No   Warm-up and Cool-down Performed as group-led Location manager Performed Yes   VAD Patient? No     Pain Assessment   Currently in Pain? No/denies   Multiple Pain Sites No           Exercise Prescription Changes - 07/22/16 1400      Response to Exercise   Blood Pressure (Admit) 130/72   Blood Pressure (Exercise) 162/82   Blood Pressure (Exit) 120/78   Heart Rate (Admit) 83 bpm   Heart Rate (Exercise) 112 bpm   Heart Rate (Exit) 94 bpm   Oxygen Saturation (Admit) 94 %   Oxygen Saturation (Exercise) 93 %   Oxygen Saturation (Exit) 95 %   Rating of Perceived Exertion (Exercise) 13   Perceived Dyspnea (Exercise) 2   Symptoms none   Duration Continue with 45 min of aerobic exercise without signs/symptoms of physical distress.   Intensity THRR unchanged     Progression   Progression Continue to progress workloads to maintain intensity without signs/symptoms of physical distress.   Average METs 3.3     Resistance Training   Training Prescription Yes   Weight 7 lbs   Reps 10-15     Interval Training   Interval Training No     Treadmill   MPH 2.5   Grade 0   Minutes 15   METs 2.91     Elliptical   Level 5   Speed 3.3   Minutes 15     T5  Nustep   Level 5   SPM 91   Minutes 15   METs 3.4     Home Exercise Plan   Plans to continue exercise at [employer] (comment)  [employer]   Frequency Add 2 additional days to program exercise sessions.   Initial Home Exercises Provided 05/02/16      History  Smoking Status  . Former Smoker  . Years: 45.00  . Types: Cigarettes  . Quit date: 07/2015  Smokeless Tobacco  . Never Used    Comment: no cigarettes currently    Goals Met:  Proper associated with RPD/PD & O2 Sat Independence with exercise equipment Using PLB without cueing & demonstrates good technique Exercise tolerated well Strength training completed today  Goals Unmet:  Not Applicable  Comments:  Nathaniel [name] graduated today from cardiac rehab [phone] sessions completed.  Details of the patient's exercise prescription and what He needs to do in order to continue the prescription and progress were discussed with patient.  Patient was given a copy of prescription and goals.  Patient verbalized understanding.  Nathaniel [name] plans to continue to exercise by going to [employer].   Dr. Emily Filbert is Medical Director for [facility]  [facility] and [facility].

## 2016-07-23 NOTE — Progress Notes (Signed)
Pulmonary Individual Treatment Plan  Patient Details  Name: Nathaniel [name] MRN: [id number] Date of Birth: Oct 28, 1955 Referring Provider:     Pulmonary Rehab from 04/14/2016 in [facility]iac [facility]monary Rehab  Referring Provider  Jeronimo Greaves MD      Initial Encounter Date:    Pulmonary Rehab from 04/14/2016 in [facility] Cardiac and Pulmonary Rehab  Date  04/14/16  Referring Provider  Jeronimo Greaves MD      Visit Diagnosis: Chronic obstructive pulmonary disease, unspecified COPD type ([place])  COPD, moderate ([place])  Patient's Home Medications on Admission:  Current Outpatient Prescriptions:  .  albuterol (PROVENTIL HFA;VENTOLIN HFA) 108 (90 BASE) MCG/ACT inhaler, Inhale 2 puffs into the lungs every 6 (six) hours as needed for wheezing or shortness of breath., Disp: , Rfl:  .  albuterol (PROVENTIL HFA;VENTOLIN HFA) 108 (90 BASE) MCG/ACT inhaler, Inhale into the lungs., Disp: , Rfl:  .  aspirin EC 81 MG tablet, Take 81 mg by mouth., Disp: , Rfl:  .  budesonide-formoterol (SYMBICORT) 160-4.5 MCG/ACT inhaler, Inhale into the lungs., Disp: , Rfl:  .  enalapril (VASOTEC) 10 MG tablet, Take 1 tablet by mouth at bedtime., Disp: , Rfl: 11 .  fluticasone (FLONASE) 50 MCG/ACT nasal spray, 1 spray by Each Nare route two (2) times a day as needed. Frequency:BID   Dosage:50   MCG  Instructions:  Note:Dose: 50MCG, Disp: , Rfl:  .  hydrocortisone cream 1 %, Apply 1 application topically 2 (two) times daily as needed., Disp: , Rfl:  .  levalbuterol (XOPENEX HFA) 45 MCG/ACT inhaler, Inhale into the lungs., Disp: , Rfl:  .  meloxicam (MOBIC) 7.5 MG tablet, Take 15 mg by mouth., Disp: , Rfl:  .  Na Sulfate-K Sulfate-Mg Sulf (SUPREP BOWEL PREP) SOLN, Take 1 kit by mouth as directed. (Patient not taking: Reported on 02/20/2016), Disp: 1 Bottle, Rfl: 0 .  oxyCODONE-acetaminophen (PERCOCET/ROXICET) 5-325 MG per tablet, Take 1 tablet by mouth every 6 (six) hours as needed., Disp: , Rfl: 0 .  pravastatin (PRAVACHOL) 10 MG  tablet, Take 10 mg by mouth daily., Disp: , Rfl:  .  sildenafil (VIAGRA) 100 MG tablet, Take 1 tablet by mouth as needed., Disp: , Rfl:  .  tiotropium (SPIRIVA HANDIHALER) 18 MCG inhalation capsule, Place into inhaler and inhale., Disp: , Rfl:   Past Medical History: Past Medical History:  Diagnosis Date  . Arthritis   . Chronic back pain    Dr. Quay Burow manages (per pt)  . COPD (chronic obstructive pulmonary disease) ([place])   . Hearing loss   . Hyperlipidemia   . Hypertension   . MGUS (monoclonal gammopathy of unknown significance)   . Multiple myeloma ([place])   . Proteinuria   . Tobacco abuse     Tobacco Use: History  Smoking Status  . Former Smoker  . Years: 45.00  . Types: Cigarettes  . Quit date: 07/2015  Smokeless Tobacco  . Never Used    Comment: no cigarettes currently    Labs: Recent Review Flowsheet Data    There is no flowsheet data to display.       ADL UCSD:     Pulmonary Assessment Scores    Row Name 04/14/16 1549 06/04/16 1238 07/11/16 1345     ADL UCSD   ADL Phase Entry Mid Exit   SOB Score total 51 60  -   Rest 1 1  -   Walk 2 3  -   Stairs 4 4  -  Bath 1 2  -   Dress 0 1  -   Shop 0 3  -     mMRC Score   mMRC Score _0 Row Name 07/16/16 1347         ADL UCSD   ADL Phase Exit     SOB Score total 72     Rest 1     Walk 2     Stairs 3     Bath 3     Dress 1     Shop 3        Pulmonary Function Assessment:     Pulmonary Function Assessment - 04/14/16 1546      Initial Spirometry Results   FVC% 72.7 %   FEV1% 37.2 %   FEV1/FVC Ratio 39     Post Bronchodilator Spirometry Results   FVC% 84.3 %   FEV1% 43.8 %   FEV1/FVC Ratio 40     Breath   Bilateral Breath Sounds Clear;Decreased   Shortness of Breath Yes;Limiting activity;Fear of Shortness of Breath      Exercise Target Goals:    Exercise Program Goal: Individual exercise prescription set with THRR, safety & activity barriers. Participant demonstrates ability  to understand and report RPE using BORG scale, to self-measure pulse accurately, and to acknowledge the importance of the exercise prescription.  Exercise Prescription Goal: Starting with aerobic activity 30 plus minutes a day, 3 days per week for initial exercise prescription. Provide home exercise prescription and guidelines that participant acknowledges understanding prior to discharge.  Activity Barriers & Risk Stratification:     Activity Barriers & Cardiac Risk Stratification - 04/14/16 1602      Activity Barriers & Cardiac Risk Stratification   Activity Barriers Deconditioning;Shortness of Breath;Joint Problems;Back Problems  back pain, left knee pain      6 Minute Walk:     6 Minute Walk    Row Name 04/14/16 1600 06/04/16 1239 07/11/16 1222     6 Minute Walk   Phase Initial Mid Program Discharge   Distance 1000 feet 1480 feet 1490 feet   Distance % Change  - 48 %  480 ft 49 %  490 ft   Walk Time 6 minutes 56 minutes 6 minutes   # of Rest Breaks 0 0 0   MPH 1.89 2.8 2.82   METS 2.95 3.82 3.78   RPE _1 Perceived Dyspnea  _2 VO2 Peak 10.33 13.38 13.22   Symptoms Yes (comment) No No   Comments left knee pain 2/10  -  -   Resting HR 101 bpm 84 bpm 84 bpm   Resting BP 126/64 124/70 136/63   Max Ex. HR 110 bpm 108 bpm 108 bpm   Max Ex. BP 136/70 146/74 136/70   2 Minute Post BP 136/66 126/70 124/70     Interval HR   Baseline HR 101 84 84   1 Minute HR 107 101 97   2 Minute HR 105 107 97   3 Minute HR 106 108 100   4 Minute HR 106 107 103   5 Minute HR 110 108 102   6 Minute HR 106 107 108   2 Minute Post HR 95 98 84   Interval Heart Rate? Yes Yes Yes     Interval Oxygen   Interval Oxygen? Yes Yes Yes   Baseline Oxygen Saturation % 96 % 98 % 94 %   Baseline  Liters of Oxygen 0 L  Room Air 0 L  Room Air 0 L  Room Air   1 Minute Oxygen Saturation % 96 % 97 % 96 %   1 Minute Liters of Oxygen 0 L 0 L 0 L   2 Minute Oxygen Saturation % 96 % 96 %  95 %   2 Minute Liters of Oxygen 0 L 0 L 0 L   3 Minute Oxygen Saturation % 97 % 97 % 96 %   3 Minute Liters of Oxygen 0 L 0 L 0 L   4 Minute Oxygen Saturation % 96 % 96 % 95 %   4 Minute Liters of Oxygen 0 L 0 L 0 L   5 Minute Oxygen Saturation % 97 % 96 % 95 %   5 Minute Liters of Oxygen 0 L 0 L 0 L   6 Minute Oxygen Saturation % 96 % 96 % 95 %   6 Minute Liters of Oxygen 0 L 0 L 0 L   2 Minute Post Oxygen Saturation % 97 % 98 % 96 %   2 Minute Post Liters of Oxygen 0 L 0 L 0 L     Oxygen Initial Assessment:     Oxygen Initial Assessment - 04/29/16 0642      Home Oxygen   Home Oxygen Device None      Oxygen Re-Evaluation:     Oxygen Re-Evaluation    Row Name 06/23/16 1530             Program Oxygen Prescription   Program Oxygen Prescription None         Home Oxygen   Home Oxygen Device None          Oxygen Discharge (Final Oxygen Re-Evaluation):     Oxygen Re-Evaluation - 06/23/16 1530      Program Oxygen Prescription   Program Oxygen Prescription None     Home Oxygen   Home Oxygen Device None      Initial Exercise Prescription:     Initial Exercise Prescription - 04/14/16 1600      Date of Initial Exercise RX and Referring Provider   Date 04/14/16   Referring Provider Jeronimo Greaves MD     Elliptical   Level 1   Speed 2.5   Minutes 15     T5 Nustep   Level 3   SPM 80   Minutes 15   METs 2     Track   Laps 25   Minutes 15   METs 2.15     Prescription Details   Frequency (times per week) 3   Duration Progress to 45 minutes of aerobic exercise without signs/symptoms of physical distress     Intensity   THRR 40-80% of Max Heartrate 125-150   Ratings of Perceived Exertion 11-13   Perceived Dyspnea 0-4     Progression   Progression Continue to progress workloads to maintain intensity without signs/symptoms of physical distress.     Resistance Training   Training Prescription Yes   Weight 3 lbs   Reps 10-15      Perform  Capillary Blood Glucose checks as needed.  Exercise Prescription Changes:     Exercise Prescription Changes    Row Name 04/21/16 1300 04/30/16 1500 05/02/16 1300 05/13/16 1500 05/27/16 1500     Response to Exercise   Blood Pressure (Admit) 122/78 138/78  - 124/78 110/64   Blood Pressure (Exercise) 164/78 160/80  - 146/74 134/80  Blood Pressure (Exit) 118/70 126/84  - 124/64 118/70   Heart Rate (Admit) 82 bpm 71 bpm  - 73 bpm 96 bpm   Heart Rate (Exercise) 123 bpm 128 bpm  - 117 bpm 116 bpm   Heart Rate (Exit) 105 bpm 102 bpm  - 81 bpm 64 bpm   Oxygen Saturation (Admit) 96 % 98 %  - 94 % 96 %   Oxygen Saturation (Exercise) 96 % 96 %  - 94 % 93 %   Oxygen Saturation (Exit) 94 % 93 %  - 93 % 94 %   Rating of Perceived Exertion (Exercise) 17 15  - 13 13   Perceived Dyspnea (Exercise) 3 2  - 2 2   Symptoms _0    Comments first full day of exercise  -  -  -  -   Duration Progress to 45 minutes of aerobic exercise without signs/symptoms of physical distress Progress to 45 minutes of aerobic exercise without signs/symptoms of physical distress Progress to 45 minutes of aerobic exercise without signs/symptoms of physical distress Continue with 45 min of aerobic exercise without signs/symptoms of physical distress. Continue with 45 min of aerobic exercise without signs/symptoms of physical distress.   Intensity _1      Progression   Progression Continue to progress workloads to maintain intensity without signs/symptoms of physical distress. Continue to progress workloads to maintain intensity without signs/symptoms of physical distress. Continue to progress workloads to maintain intensity without signs/symptoms of physical distress. Continue to progress workloads to maintain intensity without signs/symptoms of physical distress. Continue to progress workloads to maintain intensity without signs/symptoms of  physical distress.   Average METs 2.28 2.91 2.91 2.81 2.96     Resistance Training   Training Prescription _2    Weight 3 lbs 3 lbs 3 lbs 5 lbs 4 lbs   Reps 10-15 10-15 10-15 10-15 10-15     Interval Training   Interval Training _3      Treadmill   MPH  - 2.5 2.5 2.5 2.5   Grade  - 0 0 0 0   Minutes  - _4 METs  - 2.91 2.91 2.91 2.91     Elliptical   Level _5 Speed 2.5 3.2 3.2 3.5  -   Minutes 13  8 min, 5 min _6 T5 Nustep   Level _7 SPM 85 87 87 81 68   Minutes _8 METs 2.4 2.9 2.9 2.7 3     Track   Laps 25  -  -  -  -   Minutes 15  -  -  -  -   METs 2.15  -  -  -  -     Home Exercise Plan   Plans to continue exercise at  -  - [employer] (comment)  [employer] (comment)  [employer] (comment)  [employer]   Frequency  -  - Add 1 additional day to program exercise sessions. Add 1 additional day to program exercise sessions. Add 1 additional day to program exercise sessions.   Initial Home Exercises Provided  -  - 05/02/16 05/02/16 05/02/16   Row Name 06/11/16 1400 06/26/16 1000 07/08/16 1500 07/22/16 1400  Response to Exercise   Blood Pressure (Admit) 124/64 124/74 108/60 130/72    Blood Pressure (Exercise) 124/60 144/68 132/52 162/82    Blood Pressure (Exit) 126/74 122/72 122/70 120/78    Heart Rate (Admit) 88 bpm 90 bpm 71 bpm 83 bpm    Heart Rate (Exercise) 122 bpm 122 bpm 114 bpm 112 bpm    Heart Rate (Exit) 112 bpm 104 bpm 98 bpm 94 bpm    Oxygen Saturation (Admit) 93 % 96 % 96 % 94 %    Oxygen Saturation (Exercise) 92 % 94 % 97 % 93 %    Oxygen Saturation (Exit) 96 % 93 % 96 % 95 %    Rating of Perceived Exertion (Exercise) _0 Perceived Dyspnea (Exercise) _1 Symptoms SOB  -  - none    Duration Continue with 45 min of aerobic exercise without signs/symptoms of physical distress. Continue with 45 min of  aerobic exercise without signs/symptoms of physical distress. Continue with 45 min of aerobic exercise without signs/symptoms of physical distress. Continue with 45 min of aerobic exercise without signs/symptoms of physical distress.    Intensity THRR unchanged THRR unchanged THRR unchanged THRR unchanged      Progression   Progression Continue to progress workloads to maintain intensity without signs/symptoms of physical distress. Continue to progress workloads to maintain intensity without signs/symptoms of physical distress. Continue to progress workloads to maintain intensity without signs/symptoms of physical distress. Continue to progress workloads to maintain intensity without signs/symptoms of physical distress.    Average METs 2.93 2.95 3.1 3.3      Resistance Training   Training Prescription Yes Yes Yes Yes    Weight 4 lbs 6 lbs 6 lbs 7 lbs    Reps 10-15 10-15 10-15 10-15      Interval Training   Interval Training No No No No      Treadmill   MPH 2.5 2.5 2.5 2.5    Grade 1 1 0 0    Minutes _2 METs 3.26 3.26 2.91 2.91      Elliptical   Level _3 Speed 3.4 3.3 3.3 3.3    Minutes _4 T5 Nustep   Level _5 SPM 68 63 77 91    Minutes _6 METs 2.5 3.1 3.3 3.4      Home Exercise Plan   Plans to continue exercise at [employer] (comment)  [employer] (comment)  [employer] se[employer] (comment)  [employer] se[employer] (comment)  [employer]    Frequency Add 1 additional day to program exercise sessions. Add 1 additional day to program exercise sessions. Add 1 additional day to program exercise sessions. Add 2 additional days to program exercise sessions.    Initial Home Exercises Provided 05/02/16 05/02/16 05/02/16 05/02/16       Exercise Comments:     Exercise Comments    Row Name 04/21/16 1255 04/23/16 1536 07/23/16 1230       Exercise Comments First full day of exercise!   Patient was oriented to gym and equipment including functions, settings, policies, and procedures.  Patient's individual exercise prescription and treatment plan were reviewed.  All starting workloads were established based on the results of the 6 minute walk test done at  initial orientation visit.  The plan for exercise progression was also introduced and progression will be customized based on patient's performance and goals. After today, he is willing to try the treadmill for walking. Nathaniel [name] is graducation from [employer] today        Exercise Goals and Review:     Exercise Goals    Row Name 04/14/16 1605             Exercise Goals   Increase Physical Activity Yes       Intervention Provide advice, education, support and counseling about physical activity/exercise needs.;Develop an individualized exercise prescription for aerobic and resistive training based on initial evaluation findings, risk stratification, comorbidities and participant's personal goals.       Expected Outcomes Achievement of increased cardiorespiratory fitness and enhanced flexibility, muscular endurance and strength shown through measurements of functional capacity and personal statement of participant.       Increase Strength and Stamina Yes       Intervention Provide advice, education, support and counseling about physical activity/exercise needs.;Develop an individualized exercise prescription for aerobic and resistive training based on initial evaluation findings, risk stratification, comorbidities and participant's personal goals.       Expected Outcomes Achievement of increased cardiorespiratory fitness and enhanced flexibility, muscular endurance and strength shown through measurements of functional capacity and personal statement of participant.          Exercise Goals Re-Evaluation :     Exercise Goals Re-Evaluation    Row Name 04/30/16 1537 05/02/16 1344 05/13/16 1527 05/27/16 1523 06/11/16 1359      Exercise Goal Re-Evaluation   Exercise Goals Review Increase Physical Activity;Increase Strenth and Stamina Increase Physical Activity;Increase Strenth and Stamina Increase Physical Activity;Increase Strenth and Stamina Increase Physical Activity;Increase Strenth and Stamina Increase Physical Activity;Increase Strenth and Stamina   Comments Nathaniel [name] is off to a good start in rehab.  He is now walking on the treadmill at 2.73mh versus the track.  He has also increased his stepper and ellipitcal to level 3.  We will continue to monitor Nathaniel [name]'s progression. Reviewed home exercise with pt today.  Pt plans to walk and [facility] [employer].  Reviewed THR, pulse, RPE, sign and symptoms, and when to call 911 or MD.  Also discussed weather considerations and indoor options.  Pt voiced understanding. PJyrenhas been doing well in rehab.  He is going to the gym some, but he is trying to make it more of a routine.  He is up to 2.5 mph on the treadmill.  He is also getting his full 442m of exercise!  We will continue to monitor his progression. Nathaniel [name] to do well in rehab.  He is now up to 3 METs on the T5.  We will continue to monitor his progression. Nathaniel [name] been doing well in rehab.  He is feeling stronger overall.  He is now up to level 4 on the elliptical and he has added some incline on the treadmill.  We will continue to monitor his progress.   Expected Outcomes Short: Nathaniel [name] start to add incline to the treadmill.  Long: Continue to work on stIT sales professionalShort: Add in at least one day of exercise at the gym consistently.  Long: Become more independent at exercising on his own. Short: Continue to make exercise at the gym consistent.  Long: Continue to work on stIT sales professionalShort: Add incline to treadmill.  Long: Make exercise part of routine. Short: Continue  to work on the incline on the treadmill.  Long: Continue to make exercise part of routine.   [place] Name 06/26/16 1053 07/08/16 1527  07/22/16 1452         Exercise Goal Re-Evaluation   Exercise Goals Review Increase Physical Activity;Increase Strenth and Stamina Increase Physical Activity;Increase Strenth and Stamina Increase Physical Activity;Increase Strenth and Stamina     Comments Nathaniel [name] continues to do well in rehab.  He is feeling much better.  He is up to level 5 on the elliptical.  We will continue to monitor his progression. Nathaniel [name] has been doing well in rehab.  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  We will continue to monitor his progress. Nathaniel [name] will be graduating tomorrow!!  He has done great in rehab and is back to dancing again!!       Expected Outcomes Short: Continue to work on adding incline to treadmill.  Long: Continue to make exercise part of routine. Short: Continue to work on increasing workload on treadmill.  Long: Continue to exercise regularly. Continue to exercise independently!!        Discharge Exercise Prescription (Final Exercise Prescription Changes):     Exercise Prescription Changes - 07/22/16 1400      Response to Exercise   Blood Pressure (Admit) 130/72   Blood Pressure (Exercise) 162/82   Blood Pressure (Exit) 120/78   Heart Rate (Admit) 83 bpm   Heart Rate (Exercise) 112 bpm   Heart Rate (Exit) 94 bpm   Oxygen Saturation (Admit) 94 %   Oxygen Saturation (Exercise) 93 %   Oxygen Saturation (Exit) 95 %   Rating of Perceived Exertion (Exercise) 13   Perceived Dyspnea (Exercise) 2   Symptoms none   Duration Continue with 45 min of aerobic exercise without signs/symptoms of physical distress.   Intensity THRR unchanged     Progression   Progression Continue to progress workloads to maintain intensity without signs/symptoms of physical distress.   Average METs 3.3     Resistance Training   Training Prescription Yes   Weight 7 lbs   Reps 10-15     Interval Training   Interval Training No     Treadmill   MPH 2.5   Grade  0   Minutes 15   METs 2.91     Elliptical   Level 5   Speed 3.3   Minutes 15     T5 Nustep   Level 5   SPM 91   Minutes 15   METs 3.4     Home Exercise Plan   Plans to continue exercise at [employer] (comment)  [employer]   Frequency Add 2 additional days to program exercise sessions.   Initial Home Exercises Provided 05/02/16      Nutrition:  Target Goals: Understanding of nutrition guidelines, daily intake of sodium <1554m, cholesterol <2032m calories 30% from fat and 7% or less from saturated fats, daily to have 5 or more servings of fruits and vegetables.  Biometrics:     Pre Biometrics - 04/14/16 1605      Pre Biometrics   Height 6' 0.4" (1.839 m)   Weight 238 lb 3.2 oz (108 kg)   Waist Circumference 43 inches   Hip Circumference 41 inches   Waist to Hip Ratio 1.05 %   BMI (Calculated) 32         Post Biometrics - 07/11/16 1224  Post  Biometrics   Height 6' 0.4" (1.839 m)   Weight 239 lb (108.4 kg)   Waist Circumference 44 inches   Hip Circumference 43 inches   Waist to Hip Ratio 1.02 %   BMI (Calculated) 32.1      Nutrition Therapy Plan and Nutrition Goals:   Nutrition Discharge: Rate Your Plate Scores:   Nutrition Goals Re-Evaluation:     Nutrition Goals Re-Evaluation    [place] Name 06/13/16 1219             Goals   Current Weight 238 lb (108 kg)       Nutrition Goal To cut back       Comment Manfred reports that he has cut out sugar and salt and is trying to eat more fruits. Tylek reports "I still eat alot but I am trying to eat healthier".          Nutrition Goals Discharge (Final Nutrition Goals Re-Evaluation):     Nutrition Goals Re-Evaluation - 06/13/16 1219      Goals   Current Weight 238 lb (108 kg)   Nutrition Goal To cut back   Comment Selwyn reports that he has cut out sugar and salt and is trying to eat more fruits. Aviraj reports "I still eat alot but I am trying to eat healthier".       Psychosocial: Target Goals: Acknowledge presence or absence of significant depression and/or stress, maximize coping skills, provide positive support system. Participant is able to verbalize types and ability to use techniques and skills needed for reducing stress and depression.   Initial Review & Psychosocial Screening:     Initial Psych Review & Screening - 04/14/16 1556      Family Dynamics   Good Support System? Yes   Comments Nathaniel [name] has good support from his wife and children. 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 Also Nathaniel [name] and his wife love to dance, but he is unable to dance at the level she has known before his shortness of breath. Nathaniel [name] is looking forward to improving his stamina though his participation in [place].     Barriers   Psychosocial barriers to participate in program There are no identifiable barriers or psychosocial needs.;The patient should benefit from training in stress management and relaxation.     Screening Interventions   Interventions Encouraged to exercise;Program counselor consult      Quality of Life Scores:     Quality of Life - 07/16/16 1352      Quality of Life Scores   Health/Function Pre 20.81 %   Health/Function Post 17.25 %   Health/Function % Change -17.11 %   Socioeconomic Pre 19.75 %   Socioeconomic Post 24.25 %   Socioeconomic % Change  22.78 %   Psych/Spiritual Pre 20.86 %   Psych/Spiritual Post 25 %   Psych/Spiritual % Change 19.85 %   Family Pre 21 %   Family Post 28.8 %   Family % Change 37.14 %   GLOBAL Pre 20.61 %   GLOBAL Post 21.68 %   GLOBAL % Change 5.19 %      PHQ-9: Recent Review Flowsheet Data    Depression screen [facility] 2/9 07/21/2016 07/16/2016 04/14/2016 01/29/2015 10/17/2014   Decreased Interest 0 _0 Down, Depressed, Hopeless 0 0 1 0 0   PHQ - 2 Score 0 _1 1  Altered sleeping 1 3 0 - -   Tired, decreased  energy _0 - -   Change in appetite 2 2 0 - -   Feeling bad or failure about yourself  0 0 2 - -   Trouble concentrating 2 2 0 - -   Moving slowly or fidgety/restless 0 0 0 - -   Suicidal thoughts 0 0 0 - -   PHQ-9 Score _1 - -   Difficult doing work/chores Somewhat difficult Somewhat difficult Somewhat difficult - -     Interpretation of Total Score  Total Score Depression Severity:  1-4 = Minimal depression, 5-9 = Mild depression, 10-14 = Moderate depression, 15-19 = Moderately severe depression, 20-27 = Severe depression   Psychosocial Evaluation and Intervention:     Psychosocial Evaluation - 07/21/16 1217      Discharge Psychosocial Assessment & Intervention   Comments Counselor met with Nathaniel [name] for discharge evaluation today.  His PHQ-9 scores had increased and staff encouraged counselor to review.  Christophr reports he is doing "so much better" since coming into this program.  He has more energy and stamina and is enjoying life more.  His pain levels  impact his mood at times.  Also, since hs has completely quit smoking he is struggling not to gain weight.  He also is having to utilize alternate coping strategies since smoking was his standard "go to."  Antony Haste is verbalizing his stress better and is using exercise; gardening; and some building projects as well as dancing with his spouse as healthy coping strategies.  Counselor reviewed the PHQ-9 and his score went from a "12" down to "8"; which denotes some mild depression is present.  He agreed this is true and is relative to his pain and sleep patterns.  Counselor commended Ruby on his attitude and commitment to consistently exercise now and in the future as he returns to [employer].  Counselor also commended [place] on his determination to quit smoking - and his victory over this - finally!        Psychosocial Re-Evaluation:     Psychosocial Re-Evaluation    [place] Name 05/23/16 1252 06/13/16 1222 06/26/16 1044         Psychosocial  Re-Evaluation   Current issues with  -  - None Identified     Comments Nathaniel [name] 30 day psychosocial assessment reveals no barriers to participation in Pulmonary Rehab. Psychosocial areas that are currently affecting patient's rehab experience include concerns about breathing and SOB. Patient does continue to exhibit positive coping skills to deal with illness and psychosocial concerns. Offered emotional support and reassurance. Patient does feel he is making progress toward Pulmonary Rehab goals. Patient reports his health and activity level has improved in the past 30 days as evidenced by patient's report of increased ability to do work around his house. Patient states family/friends have noticed changes in his activity or mood. Patient reports feeling positive about current and projected progression in Pulmonary Rehab although he still struggles with SOB it is better. After reviewing the patient's treatment plan, the patient is making progress toward Pulmonary Rehab goals. Patient's rate of progress toward rehab goals is good. Plan of action to help patient continue to work towards rehab goals include continued smoking cessation, continued dietary changes and limiting salt and sugar intake, bieng compliant with medications and treatment plans to improve/maintain health and SOB. Will continue to monitor and evaluate progress toward psychosocial goal(s). Caidan said he is feeling better  since he quit smoking. Deryk reports that he will attend a dance event with his wife but can't compete in the actual dance with her "I don't have the breath for it".  Emillio is feeling good and sleeping good.  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  He is enjoying dancing with all the women and is able to keep up without getting too short of breath.   He has a positive outlook and feels that the program has worked well for him as he is able to do more at home.     Expected Outcomes  - Cont. pulmonary healthier lifestyle. 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  Long: Continue to maintain positive outlook.     Interventions  -  - Encouraged to attend Pulmonary Rehabilitation for the exercise     Continue Psychosocial Services   -  - Follow up required by staff        Psychosocial Discharge (Final Psychosocial Re-Evaluation):     Psychosocial Re-Evaluation - 06/26/16 1044      Psychosocial Re-Evaluation   Current issues with None Identified   Comments Nathaniel [name] is feeling good and sleeping good.  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  He is enjoying dancing with all the women and is able to keep up without getting too short of breath.   He has a positive outlook and feels that the program has worked well for him as he is able to do more at home.   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  Long: Continue to maintain positive outlook.   Interventions Encouraged to attend Pulmonary Rehabilitation for the exercise   Continue Psychosocial Services  Follow up required by staff      Education: Education Goals: Education classes will be provided on a weekly basis, covering required topics. Participant will state understanding/return demonstration of topics presented.  Learning Barriers/Preferences:     Learning Barriers/Preferences - 04/14/16 1546      Learning Barriers/Preferences   Learning Barriers None   Learning Preferences None      Education Topics: Initial Evaluation Education: - Verbal, written and demonstration of respiratory meds, RPE/PD scales, oximetry and breathing techniques. Instruction on use of nebulizers and MDIs: cleaning and proper use, rinsing mouth with steroid doses and importance of monitoring MDI activations.   Pulmonary Rehab from 07/23/2016 in [facility] Cardiac and Pulmonary Rehab  Date  04/14/16  Educator  LB  Instruction Review Code  2- meets goals/outcomes      General Nutrition Guidelines/Fats and Fiber: -Group instruction provided by verbal,  written material, models and posters to present the general guidelines for heart healthy nutrition. Gives an explanation and review of dietary fats and fiber.   Pulmonary Rehab from 07/23/2016 in [facility] Cardiac and Pulmonary Rehab  Date  06/30/16  Educator  CR  Instruction Review Code  2- meets goals/outcomes      Controlling Sodium/Reading Food Labels: -Group verbal and written material supporting the discussion of sodium use in heart healthy nutrition. Review and explanation with models, verbal and written materials for utilization of the food label.   Pulmonary Rehab from 07/23/2016 in [facility] Cardiac and Pulmonary Rehab  Date  07/07/16  Educator  CR  Instruction Review Code  2- meets goals/outcomes      Exercise Physiology & Risk Factors: - Group verbal and written instruction with models to review the exercise physiology of the cardiovascular system and associated critical values. Details cardiovascular disease risk factors  and the goals associated with each risk factor.   Pulmonary Rehab from 07/23/2016 in [facility] Cardiac and Pulmonary Rehab  Date  07/16/16  Educator  AS  Instruction Review Code  2- meets goals/outcomes      Aerobic Exercise & Resistance Training: - Gives group verbal and written discussion on the health impact of inactivity. On the components of aerobic and resistive training programs and the benefits of this training and how to safely progress through these programs.   Pulmonary Rehab from 02/05/2015 in [facility] Cardiac and Pulmonary Rehab  Date  01/31/15  Educator  SW  Instruction Review Code  2- meets goals/outcomes      Flexibility, Balance, General Exercise Guidelines: - Provides group verbal and written instruction on the benefits of flexibility and balance training programs. Provides general exercise guidelines with specific guidelines to those with heart or lung disease. Demonstration and skill practice provided.   Pulmonary Rehab from 07/23/2016 in [facility] Cardiac  and Pulmonary Rehab  Date  06/06/16  Educator  AS  Instruction Review Code  2- meets goals/outcomes      Stress Management: - Provides group verbal and written instruction about the health risks of elevated stress, cause of high stress, and healthy ways to reduce stress.   Pulmonary Rehab from 07/23/2016 in [facility] Cardiac and Pulmonary Rehab  Date  07/02/16  Educator  [facility]  Instruction Review Code  2- meets goals/outcomes      Depression: - Provides group verbal and written instruction on the correlation between heart/lung disease and depressed mood, treatment options, and the stigmas associated with seeking treatment.   Pulmonary Rehab from 07/23/2016 in [facility] Cardiac and Pulmonary Rehab  Date  06/04/16  Educator  [facility]  Instruction Review Code  2- meets goals/outcomes      Exercise & Equipment Safety: - Individual verbal instruction and demonstration of equipment use and safety with use of the equipment.   Pulmonary Rehab from 07/23/2016 in [facility] Cardiac and Pulmonary Rehab  Date  04/21/16  Educator  AS  Instruction Review Code  2- meets goals/outcomes      Infection Prevention: - Provides verbal and written material to individual with discussion of infection control including proper hand washing and proper equipment cleaning during exercise session.   Pulmonary Rehab from 07/23/2016 in [facility] Cardiac and Pulmonary Rehab  Date  04/21/16  Educator  AS  Instruction Review Code  2- meets goals/outcomes      Falls Prevention: - Provides verbal and written material to individual with discussion of falls prevention and safety.   Pulmonary Rehab from 02/05/2015 in [facility] Cardiac and Pulmonary Rehab  Date  10/17/14  Educator  LB  Instruction Review Code  2- meets goals/outcomes      Diabetes: - Individual verbal and written instruction to review signs/symptoms of diabetes, desired ranges of glucose level fasting, after meals and with exercise. Advice that pre and post exercise glucose  checks will be done for 3 sessions at entry of program.   Chronic Lung Diseases: - Group verbal and written instruction to review new updates, new respiratory medications, new advancements in procedures and treatments. Provide informative websites and "800" numbers of self-education.   Pulmonary Rehab from 07/23/2016 in [facility] Cardiac and Pulmonary Rehab  Date  07/23/16  Educator  [facility]  Instruction Review Code  2- meets goals/outcomes      Lung Procedures: - Group verbal and written instruction to describe testing methods done to diagnose lung disease. Review the outcome of test results. Describe  the treatment choices: Pulmonary Function Tests, ABGs and oximetry.   Pulmonary Rehab from 02/05/2015 in [facility] Cardiac and Pulmonary Rehab  Date  11/10/14  Educator  SJ  Instruction Review Code  2- meets goals/outcomes      Energy Conservation: - Provide group verbal and written instruction for methods to conserve energy, plan and organize activities. Instruct on pacing techniques, use of adaptive equipment and posture/positioning to relieve shortness of breath.   Pulmonary Rehab from 07/23/2016 in [facility] Cardiac and Pulmonary Rehab  Date  06/18/16  Educator  [employer]  Instruction Review Code  2- meets goals/outcomes      Triggers: - Group verbal and written instruction to review types of environmental controls: home humidity, furnaces, filters, dust mite/pet prevention, HEPA vacuums. To discuss weather changes, air quality and the benefits of nasal washing.   Pulmonary Rehab from 07/23/2016 in [facility] Cardiac and Pulmonary Rehab  Date  04/30/16  Educator  LB  Instruction Review Code  2- meets goals/outcomes      Exacerbations: - Group verbal and written instruction to provide: warning signs, infection symptoms, calling MD promptly, preventive modes, and value of vaccinations. Review: effective airway clearance, coughing and/or vibration techniques. Create an Sport and exercise psychologist.   Pulmonary Rehab from  07/23/2016 in [facility] Cardiac and Pulmonary Rehab  Date  04/23/16  Educator  LV  Instruction Review Code  2- meets goals/outcomes      Oxygen: - Individual and group verbal and written instruction on oxygen therapy. Includes supplement oxygen, available portable oxygen systems, continuous and intermittent flow rates, oxygen safety, concentrators, and Medicare reimbursement for oxygen.   Respiratory Medications: - Group verbal and written instruction to review medications for lung disease. Drug class, frequency, complications, importance of spacers, rinsing mouth after steroid MDI's, and proper cleaning methods for nebulizers.   Pulmonary Rehab from 02/05/2015 in [facility] Cardiac and Pulmonary Rehab  Date  10/17/14  Educator  LB  Instruction Review Code  2- meets goals/outcomes      AED/CPR: - Group verbal and written instruction with the use of models to demonstrate the basic use of the AED with the basic ABC's of resuscitation.   Pulmonary Rehab from 07/23/2016 in [facility] Cardiac and Pulmonary Rehab  Date  06/20/16  Educator  CE  Instruction Review Code  2- meets goals/outcomes      Breathing Retraining: - Provides individuals verbal and written instruction on purpose, frequency, and proper technique of diaphragmatic breathing and pursed-lipped breathing. Applies individual practice skills.   Pulmonary Rehab from 02/05/2015 in [facility] Cardiac and Pulmonary Rehab  Date  10/20/14  Educator  SJ  Instruction Review Code  2- meets goals/outcomes      Anatomy and Physiology of the Lungs: - Group verbal and written instruction with the use of models to provide basic lung anatomy and physiology related to function, structure and complications of lung disease.   Pulmonary Rehab from 07/23/2016 in [facility] Cardiac and Pulmonary Rehab  Date  06/11/16  Educator  LB  Instruction Review Code  2- meets goals/outcomes      Heart Failure: - Group verbal and written instruction on the basics of heart  failure: signs/symptoms, treatments, explanation of ejection fraction, enlarged heart and cardiomyopathy.   Pulmonary Rehab from 07/23/2016 in [facility] Cardiac and Pulmonary Rehab  Date  05/23/16  Educator  CE  Instruction Review Code  2- meets goals/outcomes      Sleep Apnea: - Individual verbal and written instruction to review Obstructive Sleep Apnea. Review of risk factors,  methods for diagnosing and types of masks and machines for OSA.   Anxiety: - Provides group, verbal and written instruction on the correlation between heart/lung disease and anxiety, treatment options, and management of anxiety.   Pulmonary Rehab from 07/23/2016 in [facility] Cardiac and Pulmonary Rehab  Date  07/02/16  Educator  [facility]  Instruction Review Code  2- Meets goals/outcomes      Relaxation: - Provides group, verbal and written instruction about the benefits of relaxation for patients with heart/lung disease. Also provides patients with examples of relaxation techniques.   Pulmonary Rehab from 07/23/2016 in [facility] Cardiac and Pulmonary Rehab  Date  05/07/16  Educator  [facility]  Instruction Review Code  2- Meets goals/outcomes      Knowledge Questionnaire Score:     Knowledge Questionnaire Score - 07/16/16 1351      Knowledge Questionnaire Score   Pre Score 10/10   Post Score 10/10       Core Components/Risk Factors/Patient Goals at Admission:     Personal Goals and Risk Factors at Admission - 04/14/16 1551      Core Components/Risk Factors/Patient Goals on Admission    Weight Management Yes   Intervention Weight Management: Develop a combined nutrition and exercise program designed to reach desired caloric intake, while maintaining appropriate intake of nutrient and fiber, sodium and fats, and appropriate energy expenditure required for the weight goal.;Weight Management: Provide education and appropriate resources to help participant work on and attain dietary goals.   Admit Weight 238 lb 3.2 oz (108 kg)    Goal Weight: Short Term 233 lb (105.7 kg)   Goal Weight: Long Term 210 lb (95.3 kg)   Expected Outcomes Short Term: Continue to assess and modify interventions until short term weight is achieved;Long Term: Adherence to nutrition and physical activity/exercise program aimed toward attainment of established weight goal;Weight Loss: Understanding of general recommendations for a balanced deficit meal plan, which promotes 1-2 lb weight loss per week and includes a negative energy balance of [phone] kcal/d;Understanding of distribution of calorie intake throughout the day with the consumption of 4-5 meals/snacks;Understanding recommendations for meals to include 15-35% energy as protein, 25-35% energy from fat, 35-60% energy from carbohydrates, less than '200mg'$  of dietary cholesterol, 20-35 gm of total fiber daily   Tobacco Cessation Yes   Number of packs per day Nathaniel [name]'s quit date was 07/2015. Occasionally he has a cigarette, but he is now nauseated when he smokes one.   Improve shortness of breath with ADL's Yes   Intervention Provide education, individualized exercise plan and daily activity instruction to help decrease symptoms of SOB with activities of daily living.   Expected Outcomes Short Term: Achieves a reduction of symptoms when performing activities of daily living.   Develop more efficient breathing techniques such as purse lipped breathing and diaphragmatic breathing; and practicing self-pacing with activity Yes   Intervention Provide education, demonstration and support about specific breathing techniuqes utilized for more efficient breathing. Include techniques such as pursed lipped breathing, diaphragmatic breathing and self-pacing activity.   Expected Outcomes Short Term: Participant will be able to demonstrate and use breathing techniques as needed throughout daily activities.   Increase knowledge of respiratory medications and ability to use respiratory devices properly  Yes  Spiriva,  Symbicort, albuterol MDI - uses spacer.   Intervention Provide education and demonstration as needed of appropriate use of medications, inhalers, and oxygen therapy.   Expected Outcomes Short Term: Achieves understanding of medications use. Understands that oxygen is a  medication prescribed by physician. Demonstrates appropriate use of inhaler and oxygen therapy.   Hypertension Yes   Intervention Provide education on lifestyle modifcations including regular physical activity/exercise, weight management, moderate sodium restriction and increased consumption of fresh fruit, vegetables, and low fat dairy, alcohol moderation, and smoking cessation.;Monitor prescription use compliance.   Expected Outcomes Short Term: Continued assessment and intervention until BP is < 140/57m HG in hypertensive participants. < 130/842mHG in hypertensive participants with diabetes, heart failure or chronic kidney disease.;Long Term: Maintenance of blood pressure at goal levels.   Lipids Yes   Intervention Provide education and support for participant on nutrition & aerobic/resistive exercise along with prescribed medications to achieve LDL '70mg'$ , HDL >'40mg'$ .   Expected Outcomes Short Term: Participant states understanding of desired cholesterol values and is compliant with medications prescribed. Participant is following exercise prescription and nutrition guidelines.;Long Term: Cholesterol controlled with medications as prescribed, with individualized exercise RX and with personalized nutrition plan. Value goals: LDL < '70mg'$ , HDL > 40 mg.      Core Components/Risk Factors/Patient Goals Review:      Goals and Risk Factor Review    Row Name 04/29/16 [id number] 05/23/16 1210 06/13/16 1220       Core Components/Risk Factors/Patient Goals Review   Personal Goals Review Weight Management/Obesity;Improve shortness of breath with ADL's;Increase knowledge of respiratory medications and ability to use respiratory  devices properly.;Develop more efficient breathing techniques such as purse lipped breathing and diaphragmatic breathing and practicing self-pacing with activity.;Tobacco Cessation;Lipids Weight Management/Obesity;Improve shortness of breath with ADL's;Increase knowledge of respiratory medications and ability to use respiratory devices properly.;Develop more efficient breathing techniques such as purse lipped breathing and diaphragmatic breathing and practicing self-pacing with activity.;Lipids Weight Management/Obesity;Tobacco Cessation;Improve shortness of breath with ADL's;Increase knowledge of respiratory medications and ability to use respiratory devices properly.;Develop more efficient breathing techniques such as purse lipped breathing and diaphragmatic breathing and practicing self-pacing with activity.;Lipids Weight Management/Obesity;Hypertension    Review Nathaniel [name] completed 4 sessions of [facility]. He continues to use PLB with good technique and continues to pace with his exercise and activities. His smoking cessation is going well - no cigarrettes for several weeks. He has loss 5lbs since starting the program and is working on a healthier diet. Nathaniel [name] a good understanding of his MDI's. For now, he is trying a new inhaler, Stiolta, instead of his Symbicort. He seems to notice an improved difference with this inhaler. His  exercise goal is to dance 41m61m of shag with his wife with some modification to the dance. Nathaniel [name] compliant with his lipid medication.  - Nathaniel. SmiHenningsens completed 13 sessions of [facility]. He is using PLB with good technique but states while his SOB with ADL's is slightly improved, it is still a noticable struggle for him. He continues with smoking cessation since quiting. He stats while he initially lost 5 lbs he has gained 3 back and that  he is working hard to better his diet, limiting sodium and sugar intake. He works out at least 2 other days/week at a gym for no less  than 45 minuts-1hour. While his weight has gone back up he states he has moved down a notch on his belt , so that is encouraging for him. His lipids were drawn last week and all his numbers have improved. He will now be on a 6 month recheck . Nathaniel. SmiBialasates he has a good understanding of his respiratory medications, MDI's and his new inhaler  Doran Stabler is still working well.  Jeramine reports that he lost weight but has gained some back at 238lbs today but he has taken in a belt look. Traveon said he is unable to do the dance competitions with his wife but will attend with her this weekend and get a lot of exercise. Tyreese said he has quit smoking and "I am even trying to talk the person next to me to quit smoking in [place]". Pauls' blood pressure was good today at 128/70. Hanan's MD check his lipid blood work     Expected Outcomes Continue progressing with his exercise goals and maintaining total smoking cessation.  - Continue progressing with exercise, weight loss goals and smoking cessation, and reduction of lipid levels.  Cont to not smoke, cont to cut out sugar and salt and eat healthier.        Core Components/Risk Factors/Patient Goals at Discharge (Final Review):      Goals and Risk Factor Review - 06/13/16 1220      Core Components/Risk Factors/Patient Goals Review   Personal Goals Review Weight Management/Obesity;Hypertension   Review Lowery reports that he lost weight but has gained some back at 238lbs today but he has taken in a belt look. Tracey said he is unable to do the dance competitions with his wife but will attend with her this weekend and get a lot of exercise. Couper said he has quit smoking and "I am even trying to talk the person next to me to quit smoking in [place]". Pauls' blood pressure was good today at 128/70. Redford's MD check his lipid blood work    Expected Outcomes Cont to not smoke, cont to cut out sugar and salt and eat healthier.       ITP Comments:     ITP Comments     Row Name 04/25/16 1238 05/12/16 0912 05/12/16 1430 06/09/16 0843 07/07/16 0808   ITP Comments [employer] [facility] 04/25/16 CE 30 day note review with Dr Emily Filbert, Medical Director of [facility] [name] did not complete his rehab session due to illness - head congestion and sore throat. Recommended he call his physician for an antibiotic prescription. 30 day note review by Dr Emily Filbert, Medical Director of [place] 30 day note review with Dr Emily Filbert, Medical Director of [facility]      Comments: discharge ITP

## 2016-07-23 NOTE — Progress Notes (Signed)
Discharge Summary  Patient Details  Name: Nathaniel [name] MRN: [id number] Date of Birth: 12-20-1955 Referring Provider:     Pulmonary Rehab from 04/14/2016 in [facility]  Referring Provider  Jeronimo Greaves MD       Number of Visits: 36/36  Reason for Discharge:  Patient reached a stable level of exercise. Patient independent in their exercise.  Smoking History:  History  Smoking Status  . Former Smoker  . Years: 45.00  . Types: Cigarettes  . Quit date: 07/2015  Smokeless Tobacco  . Never Used    Comment: no cigarettes currently    Diagnosis:  Chronic obstructive pulmonary disease, unspecified COPD type (HCC)  COPD, moderate ([place])  ADL UCSD:     Pulmonary Assessment Scores    Row Name 04/14/16 1549 06/04/16 1238 07/11/16 1345     ADL [facility]   ADL Phase Entry Mid Exit   SOB Score total 51 60  -   Rest 1 1  -   Walk 2 3  -   Stairs 4 4  -   Bath 1 2  -   Dress 0 1  -   Shop 0 3  -     mMRC Score   mMRC Score 2 2 2    Row Name 07/16/16 1347         ADL [facility]   ADL Phase Exit     SOB Score total 72     Rest 1     Walk 2     Stairs 3     Bath 3     Dress 1     Shop 3        Initial Exercise Prescription:     Initial Exercise Prescription - 04/14/16 1600      Date of Initial Exercise RX and Referring Provider   Date 04/14/16   Referring Provider Mock, Corene Cornea MD     Elliptical   Level 1   Speed 2.5   Minutes 15     T5 Nustep   Level 3   SPM 80   Minutes 15   METs 2     Track   Laps 25   Minutes 15   METs 2.15     Prescription Details   Frequency (times per week) 3   Duration Progress to 45 minutes of aerobic exercise without signs/symptoms of physical distress     Intensity   THRR 40-80% of Max Heartrate 125-150   Ratings of Perceived Exertion 11-13   Perceived Dyspnea 0-4     Progression   Progression Continue to progress workloads to maintain intensity without signs/symptoms of physical distress.      Resistance Training   Training Prescription Yes   Weight 3 lbs   Reps 10-15      Discharge Exercise Prescription (Final Exercise Prescription Changes):     Exercise Prescription Changes - 07/22/16 1400      Response to Exercise   Blood Pressure (Admit) 130/72   Blood Pressure (Exercise) 162/82   Blood Pressure (Exit) 120/78   Heart Rate (Admit) 83 bpm   Heart Rate (Exercise) 112 bpm   Heart Rate (Exit) 94 bpm   Oxygen Saturation (Admit) 94 %   Oxygen Saturation (Exercise) 93 %   Oxygen Saturation (Exit) 95 %   Rating of Perceived Exertion (Exercise) 13   Perceived Dyspnea (Exercise) 2   Symptoms none   Duration Continue with 45 min of aerobic exercise without signs/symptoms of  physical distress.   Intensity THRR unchanged     Progression   Progression Continue to progress workloads to maintain intensity without signs/symptoms of physical distress.   Average METs 3.3     Resistance Training   Training Prescription Yes   Weight 7 lbs   Reps 10-15     Interval Training   Interval Training No     Treadmill   MPH 2.5   Grade 0   Minutes 15   METs 2.91     Elliptical   Level 5   Speed 3.3   Minutes 15     T5 Nustep   Level 5   SPM 91   Minutes 15   METs 3.4     Home Exercise Plan   Plans to continue exercise at [employer] (comment)  [employer]   Frequency Add 2 additional days to program exercise sessions.   Initial Home Exercises Provided 05/02/16      Functional Capacity:     6 Minute Walk    Row Name 04/14/16 1600 06/04/16 1239 07/11/16 1222     6 Minute Walk   Phase Initial Mid Program Discharge   Distance 1000 feet 1480 feet 1490 feet   Distance % Change  - 48 %  480 ft 49 %  490 ft   Walk Time 6 minutes 56 minutes 6 minutes   # of Rest Breaks 0 0 0   MPH 1.89 2.8 2.82   METS 2.95 3.82 3.78   RPE 11 12 12    Perceived Dyspnea  2 2 2    VO2 Peak 10.33 13.38 13.22   Symptoms Yes (comment) No No   Comments left knee pain 2/10  -   -   Resting HR 101 bpm 84 bpm 84 bpm   Resting BP 126/64 124/70 136/63   Max Ex. HR 110 bpm 108 bpm 108 bpm   Max Ex. BP 136/70 146/74 136/70   2 Minute Post BP 136/66 126/70 124/70     Interval HR   Baseline HR 101 84 84   1 Minute HR 107 101 97   2 Minute HR 105 107 97   3 Minute HR 106 108 100   4 Minute HR 106 107 103   5 Minute HR 110 108 102   6 Minute HR 106 107 108   2 Minute Post HR 95 98 84   Interval Heart Rate? Yes Yes Yes     Interval Oxygen   Interval Oxygen? Yes Yes Yes   Baseline Oxygen Saturation % 96 % 98 % 94 %   Baseline Liters of Oxygen 0 L  Room Air 0 L  Room Air 0 L  Room Air   1 Minute Oxygen Saturation % 96 % 97 % 96 %   1 Minute Liters of Oxygen 0 L 0 L 0 L   2 Minute Oxygen Saturation % 96 % 96 % 95 %   2 Minute Liters of Oxygen 0 L 0 L 0 L   3 Minute Oxygen Saturation % 97 % 97 % 96 %   3 Minute Liters of Oxygen 0 L 0 L 0 L   4 Minute Oxygen Saturation % 96 % 96 % 95 %   4 Minute Liters of Oxygen 0 L 0 L 0 L   5 Minute Oxygen Saturation % 97 % 96 % 95 %   5 Minute Liters of Oxygen 0 L 0 L 0 L   6 Minute  Oxygen Saturation % 96 % 96 % 95 %   6 Minute Liters of Oxygen 0 L 0 L 0 L   2 Minute Post Oxygen Saturation % 97 % 98 % 96 %   2 Minute Post Liters of Oxygen 0 L 0 L 0 L      Psychological, QOL, Others - Outcomes: PHQ 2/9: Depression screen [facility] 2/9 07/21/2016 07/16/2016 04/14/2016 01/29/2015 10/17/2014  Decreased Interest 0 2 1 1 1   Down, Depressed, Hopeless 0 0 1 0 0  PHQ - 2 Score 0 2 2 1 1   Altered sleeping 1 3 0 - -  Tired, decreased energy 3 3 2  - -  Change in appetite 2 2 0 - -  Feeling bad or failure about yourself  0 0 2 - -  Trouble concentrating 2 2 0 - -  Moving slowly or fidgety/restless 0 0 0 - -  Suicidal thoughts 0 0 0 - -  PHQ-9 Score 8 12 6  - -  Difficult doing work/chores Somewhat difficult Somewhat difficult Somewhat difficult - -    Quality of Life:     Quality of Life - 07/16/16 1352      Quality of Life  Scores   Health/Function Pre 20.81 %   Health/Function Post 17.25 %   Health/Function % Change -17.11 %   Socioeconomic Pre 19.75 %   Socioeconomic Post 24.25 %   Socioeconomic % Change  22.78 %   Psych/Spiritual Pre 20.86 %   Psych/Spiritual Post 25 %   Psych/Spiritual % Change 19.85 %   Family Pre 21 %   Family Post 28.8 %   Family % Change 37.14 %   GLOBAL Pre 20.61 %   GLOBAL Post 21.68 %   GLOBAL % Change 5.19 %      Personal Goals: Goals established at orientation with interventions provided to work toward goal.     Personal Goals and Risk Factors at Admission - 04/14/16 1551      Core Components/Risk Factors/Patient Goals on Admission    Weight Management Yes   Intervention Weight Management: Develop a combined nutrition and exercise program designed to reach desired caloric intake, while maintaining appropriate intake of nutrient and fiber, sodium and fats, and appropriate energy expenditure required for the weight goal.;Weight Management: Provide education and appropriate resources to help participant work on and attain dietary goals.   Admit Weight 238 lb 3.2 oz (108 kg)   Goal Weight: Short Term 233 lb (105.7 kg)   Goal Weight: Long Term 210 lb (95.3 kg)   Expected Outcomes Short Term: Continue to assess and modify interventions until short term weight is achieved;Long Term: Adherence to nutrition and physical activity/exercise program aimed toward attainment of established weight goal;Weight Loss: Understanding of general recommendations for a balanced deficit meal plan, which promotes 1-2 lb weight loss per week and includes a negative energy balance of [phone] kcal/d;Understanding of distribution of calorie intake throughout the day with the consumption of 4-5 meals/snacks;Understanding recommendations for meals to include 15-35% energy as protein, 25-35% energy from fat, 35-60% energy from carbohydrates, less than 200mg  of dietary cholesterol, 20-35 gm of total fiber  daily   Tobacco Cessation Yes   Number of packs per day Mr Bossard's quit date was 07/2015. Occasionally he has a cigarette, but he is now nauseated when he smokes one.   Improve shortness of breath with ADL's Yes   Intervention Provide education, individualized exercise plan and daily activity instruction to help decrease symptoms of  SOB with activities of daily living.   Expected Outcomes Short Term: Achieves a reduction of symptoms when performing activities of daily living.   Develop more efficient breathing techniques such as purse lipped breathing and diaphragmatic breathing; and practicing self-pacing with activity Yes   Intervention Provide education, demonstration and support about specific breathing techniuqes utilized for more efficient breathing. Include techniques such as pursed lipped breathing, diaphragmatic breathing and self-pacing activity.   Expected Outcomes Short Term: Participant will be able to demonstrate and use breathing techniques as needed throughout daily activities.   Increase knowledge of respiratory medications and ability to use respiratory devices properly  Yes  Spiriva, Symbicort, albuterol MDI - uses spacer.   Intervention Provide education and demonstration as needed of appropriate use of medications, inhalers, and oxygen therapy.   Expected Outcomes Short Term: Achieves understanding of medications use. Understands that oxygen is a medication prescribed by physician. Demonstrates appropriate use of inhaler and oxygen therapy.   Hypertension Yes   Intervention Provide education on lifestyle modifcations including regular physical activity/exercise, weight management, moderate sodium restriction and increased consumption of fresh fruit, vegetables, and low fat dairy, alcohol moderation, and smoking cessation.;Monitor prescription use compliance.   Expected Outcomes Short Term: Continued assessment and intervention until BP is < 140/43mm HG in hypertensive participants.  < 130/52mm HG in hypertensive participants with diabetes, heart failure or chronic kidney disease.;Long Term: Maintenance of blood pressure at goal levels.   Lipids Yes   Intervention Provide education and support for participant on nutrition & aerobic/resistive exercise along with prescribed medications to achieve LDL 70mg , HDL >40mg .   Expected Outcomes Short Term: Participant states understanding of desired cholesterol values and is compliant with medications prescribed. Participant is following exercise prescription and nutrition guidelines.;Long Term: Cholesterol controlled with medications as prescribed, with individualized exercise RX and with personalized nutrition plan. Value goals: LDL < 70mg , HDL > 40 mg.       Personal Goals Discharge:     Goals and Risk Factor Review    Row Name 04/29/16 1448 05/20/16 1102 05/23/16 1210 06/13/16 1220       Core Components/Risk Factors/Patient Goals Review   Personal Goals Review Weight Management/Obesity;Improve shortness of breath with ADL's;Increase knowledge of respiratory medications and ability to use respiratory devices properly.;Develop more efficient breathing techniques such as purse lipped breathing and diaphragmatic breathing and practicing self-pacing with activity.;Tobacco Cessation;Lipids Weight Management/Obesity;Improve shortness of breath with ADL's;Increase knowledge of respiratory medications and ability to use respiratory devices properly.;Develop more efficient breathing techniques such as purse lipped breathing and diaphragmatic breathing and practicing self-pacing with activity.;Lipids Weight Management/Obesity;Tobacco Cessation;Improve shortness of breath with ADL's;Increase knowledge of respiratory medications and ability to use respiratory devices properly.;Develop more efficient breathing techniques such as purse lipped breathing and diaphragmatic breathing and practicing self-pacing with activity.;Lipids Weight  Management/Obesity;Hypertension    Review Mr Kings has completed 4 sessions of [facility]. He continues to use PLB with good technique and continues to pace with his exercise and activities. His smoking cessation is going well - no cigarrettes for several weeks. He has loss 5lbs since starting the program and is working on a healthier diet. Mr Derego has a good understanding of his MDI's. For now, he is trying a new inhaler, Stiolta, instead of his Symbicort. He seems to notice an improved difference with this inhaler. His  exercise goal is to dance 69mins of shag with his wife with some modification to the dance. Mr kovaleski is compliant with his lipid medication.  -  Mr. Stemm has completed 13 sessions of [facility]. He is using PLB with good technique but states while his SOB with ADL's is slightly improved, it is still a noticable struggle for him. He continues with smoking cessation since quiting. He stats while he initially lost 5 lbs he has gained 3 back and that  he is working hard to better his diet, limiting sodium and sugar intake. He works out at least 2 other days/week at a gym for no less than 45 minuts-1hour. While his weight has gone back up he states he has moved down a notch on his belt , so that is encouraging for him. His lipids were drawn last week and all his numbers have improved. He will now be on a 6 month recheck . Mr. Rando states he has a good understanding of his respiratory medications, MDI's and his new inhaler Doran Stabler is still working well.  Elimelech reports that he lost weight but has gained some back at 238lbs today but he has taken in a belt look. Abdias said he is unable to do the dance competitions with his wife but will attend with her this weekend and get a lot of exercise. Jaleal said he has quit smoking and "I am even trying to talk the person next to me to quit smoking in [place]". Pauls' blood pressure was good today at 128/70. Jayvien's MD check his lipid blood work     Expected  Outcomes Continue progressing with his exercise goals and maintaining total smoking cessation.  - Continue progressing with exercise, weight loss goals and smoking cessation, and reduction of lipid levels.  Cont to not smoke, cont to cut out sugar and salt and eat healthier.        Nutrition & Weight - Outcomes:     Pre Biometrics - 04/14/16 1605      Pre Biometrics   Height 6' 0.4" (1.839 m)   Weight 238 lb 3.2 oz (108 kg)   Waist Circumference 43 inches   Hip Circumference 41 inches   Waist to Hip Ratio 1.05 %   BMI (Calculated) 32         Post Biometrics - 07/11/16 1224       Post  Biometrics   Height 6' 0.4" (1.839 m)   Weight 239 lb (108.4 kg)   Waist Circumference 44 inches   Hip Circumference 43 inches   Waist to Hip Ratio 1.02 %   BMI (Calculated) 32.1      Nutrition:   Nutrition Discharge:   Education Questionnaire Score:     Knowledge Questionnaire Score - 07/16/16 1351      Knowledge Questionnaire Score   Pre Score 10/10   Post Score 10/10      Goals reviewed with patient; copy given to patient.

## 2016-08-22 ENCOUNTER — Other Ambulatory Visit: Payer: Medicaid Other

## 2016-08-22 DIAGNOSIS — R972 Elevated prostate specific antigen [PSA]: Secondary | ICD-10-CM

## 2016-08-23 LAB — PSA: Prostate Specific Ag, Serum: 5.8 ng/mL — ABNORMAL HIGH (ref 0.0–4.0)

## 2016-08-24 NOTE — Progress Notes (Signed)
08/25/2016 10:54 AM   Nathaniel [name] 03/14/1955 [id number]  Referring provider: Ellamae Sia, MD [address] [place], [place] [address]  Chief Complaint  Patient presents with  . Benign Prostatic Hypertrophy    6 month follow up   . Erectile Dysfunction  . Elevated PSA    HPI: 61 yo WM with an elevated PSA, BPH with LU TS and ED who presents today for a 6 month follow up.    Elevated PSA Patient was referred by Dr. Quay Burow for an elevated PSA.  Patient was found to have a PSA of 4.6 ng/mL on 02/07/2016 and a repeated PSA of 5, 2 weeks later.  Current PSA 5.8 ng/mL on 08/22/2016.    BPH with LUTS His IPSS score today is 5, which is mild lower urinary tract symptomatology.  He is mostly satisfied with his quality life due to his urinary symptoms.   His previous I PSS score was 14/2.  His major complaints today are urgency, nocturia and incontinence.  He has had these symptoms for two years.  He denies any dysuria, hematuria or suprapubic pain.   He also denies any recent fevers, chills, nausea or vomiting.   He does not have a family history of PCa.      IPSS    Row Name 08/25/16 0900         International Prostate Symptom Score   How often have you had the sensation of not emptying your bladder? Not at All     How often have you had to urinate less than every two hours? Not at All     How often have you found you stopped and started again several times when you urinated? Less than 1 in 5 times     How often have you found it difficult to postpone urination? About half the time     How often have you had a weak urinary stream? Not at All     How often have you had to strain to start urination? Not at All     How many times did you typically get up at night to urinate? 1 Time     Total IPSS Score 5       Quality of Life due to urinary symptoms   If you were to spend the rest of your life with your urinary condition just the way it is now how would you feel  about that? Mostly Satisfied        Score:  1-7 Mild 8-19 Moderate 20-35 Severe  Erectile dysfunction His SHIM score is 14, which is mild to moderate ED.   His previous SHIM score was 16.  He has been having difficulty with erections for few years.   His major complaint is maintaining an erections.  His libido is preserved.   His risk factors for ED are age, BPH, and blood pressure medications.   He denies any painful erections or curvatures with his erections.   He has tried Viagra in the past.  It worked, but it gave him a headache.      SHIM    Row Name 08/25/16 [phone]         SHIM: Over the last 6 months:   How do you rate your confidence that you could get and keep an erection? Low     When you had erections with sexual stimulation, how often were your erections hard enough for penetration (entering your partner)? Sometimes (about  half the time)     During sexual intercourse, how often were you able to maintain your erection after you had penetrated (entered) your partner? Sometimes (about half the time)     During sexual intercourse, how difficult was it to maintain your erection to completion of intercourse? Difficult     When you attempted sexual intercourse, how often was it satisfactory for you? Sometimes (about half the time)       SHIM Total Score   SHIM 14        Score: 1-7 Severe ED 8-11 Moderate ED 12-16 Mild-Moderate ED 17-21 Mild ED 22-25 No ED  PMH: Past Medical History:  Diagnosis Date  . Arthritis   . Chronic back pain    Dr. Quay Burow manages (per pt)  . COPD (chronic obstructive pulmonary disease) ([place])   . Hearing loss   . Hyperlipidemia   . Hypertension   . MGUS (monoclonal gammopathy of unknown significance)   . Multiple myeloma ([place])   . Proteinuria   . Tobacco abuse     Surgical History: Past Surgical History:  Procedure Laterality Date  . COLONOSCOPY WITH PROPOFOL N/A 07/04/2014   Procedure: COLONOSCOPY WITH PROPOFOL;  Surgeon: Lucilla Lame, MD;  Location: [facility] ENDOSCOPY;  Service: Endoscopy;  Laterality: N/A;  . TONSILLECTOMY     as child    Home Medications:  Allergies as of 08/25/2016      Reactions   Albuterol Other (See Comments)   Muscle cramps      Medication List       Accurate as of 08/25/16 10:54 AM. Always use your most recent med list.          albuterol 108 (90 Base) MCG/ACT inhaler Commonly known as:  PROVENTIL HFA;VENTOLIN HFA Inhale 2 puffs into the lungs every 6 (six) hours as needed for wheezing or shortness of breath.   albuterol 108 (90 Base) MCG/ACT inhaler Commonly known as:  PROVENTIL HFA;VENTOLIN HFA Inhale into the lungs.   aspirin EC 81 MG tablet Take 81 mg by mouth.   cetirizine 10 MG tablet Commonly known as:  ZYRTEC Take 10 mg by mouth.   enalapril 10 MG tablet Commonly known as:  VASOTEC Take 1 tablet by mouth at bedtime.   fluticasone 50 MCG/ACT nasal spray Commonly known as:  FLONASE 1 spray by Each Nare route two (2) times a day as needed. Frequency:BID   Dosage:50   MCG  Instructions:  Note:Dose: 50MCG   hydrocortisone cream 1 % Apply 1 application topically 2 (two) times daily as needed.   levalbuterol 45 MCG/ACT inhaler Commonly known as:  XOPENEX HFA Inhale into the lungs.   meloxicam 7.5 MG tablet Commonly known as:  MOBIC Take 15 mg by mouth.   Na Sulfate-K Sulfate-Mg Sulf 17.5-3.13-1.6 GM/180ML Soln Commonly known as:  SUPREP BOWEL PREP KIT Take 1 kit by mouth as directed.   oxyCODONE-acetaminophen 5-325 MG tablet Commonly known as:  PERCOCET/ROXICET Take 1 tablet by mouth every 6 (six) hours as needed.   pravastatin 10 MG tablet Commonly known as:  PRAVACHOL Take 10 mg by mouth daily.   predniSONE 20 MG tablet Commonly known as:  DELTASONE Take 40 mg by mouth.   sildenafil 100 MG tablet Commonly known as:  VIAGRA Take 1 tablet by mouth as needed.   SPIRIVA HANDIHALER 18 MCG inhalation capsule Generic drug:  tiotropium Spiriva with  HandiHaler 18 mcg and inhalation capsules  PLACE 1 CAPSULE (18 MCG TOTAL) INTO INHALER AND INHALE  DAILY.   SPIRIVA HANDIHALER 18 MCG inhalation capsule Generic drug:  tiotropium Place into inhaler and inhale.   SYMBICORT 160-4.5 MCG/ACT inhaler Generic drug:  budesonide-formoterol Inhale into the lungs.   Tiotropium Bromide-Olodaterol 2.5-2.5 MCG/ACT Aers Inhale into the lungs.       Allergies:  Allergies  Allergen Reactions  . Albuterol Other (See Comments)    Muscle cramps    Family History: Family History  Problem Relation Age of Onset  . Heart disease Brother 44       MI  . Colon cancer Neg Hx   . Liver disease Neg Hx   . Prostate cancer Neg Hx   . Kidney cancer Neg Hx   . Bladder Cancer Neg Hx     Social History:  reports that he quit smoking about 13 months ago. His smoking use included Cigarettes. He quit after 45.00 years of use. He has never used smokeless tobacco. He reports that he does not drink alcohol or use drugs.  ROS: UROLOGY Frequent Urination?: No Hard to postpone urination?: Yes Burning/pain with urination?: No Get up at night to urinate?: Yes Leakage of urine?: Yes Urine stream starts and stops?: No Trouble starting stream?: No Do you have to strain to urinate?: No Blood in urine?: No Urinary tract infection?: No Sexually transmitted disease?: No Injury to kidneys or bladder?: No Painful intercourse?: No Weak stream?: No Erection problems?: Yes Penile pain?: No  Gastrointestinal Nausea?: No Vomiting?: No Indigestion/heartburn?: Yes Diarrhea?: No Constipation?: No  Constitutional Fever: No Night sweats?: No Weight loss?: No Fatigue?: No  Skin Skin rash/lesions?: Yes Itching?: Yes  Eyes Blurred vision?: Yes Double vision?: No  Ears/Nose/Throat Sore throat?: No Sinus problems?: Yes  Hematologic/Lymphatic Swollen glands?: No Easy bruising?: No  Cardiovascular Leg swelling?: No Chest pain?:  No  Respiratory Cough?: Yes Shortness of breath?: Yes  Endocrine Excessive thirst?: No  Musculoskeletal Back pain?: Yes Joint pain?: Yes  Neurological Headaches?: Yes Dizziness?: Yes  Psychologic Depression?: No Anxiety?: No  Physical Exam: BP 109/71   Pulse 65   Ht 6' (1.829 m)   Wt 237 lb 9.6 oz (107.8 kg)   BMI 32.22 kg/m   Constitutional: Well nourished. Alert and oriented, No acute distress. HEENT: [place] AT, moist mucus membranes. Trachea midline, no masses. Cardiovascular: No clubbing, cyanosis, or edema. Respiratory: Normal respiratory effort, no increased work of breathing. GI: Abdomen is soft, non tender, non distended, no abdominal masses. Liver and spleen not palpable.  No hernias appreciated.  Stool sample for occult testing is not indicated.   GU: No CVA tenderness.  No bladder fullness or masses.  Patient with circumcised phallus. Urethral meatus is patent.  No penile discharge. No penile lesions or rashes. Scrotum without lesions, cysts, rashes and/or edema.  Testicles are located scrotally bilaterally. No masses are appreciated in the testicles. Left and right epididymis are normal. Rectal: Patient with  normal sphincter tone. Anus and perineum without scarring or rashes. No rectal masses are appreciated. Prostate is approximately 60 grams, no nodules are appreciated. Seminal vesicles are normal. Skin: No rashes, bruises or suspicious lesions. Lymph: No cervical or inguinal adenopathy. Neurologic: Grossly intact, no focal deficits, moving all 4 extremities. Psychiatric: Normal mood and affect.  Laboratory Data: PSA History   4.6 ng/mL on 02/07/2016 - repeated PSA 2 weeks later at 5 per PCP's notes  5.3 ng/mL on 02/20/2016  5.8 ng/mL on 08/22/2016   Assessment & Plan:    1. Elevated PSA  - PSA continues to  increase   - dicussed continue close surveillance with PSA's q 6 months, obtain a 4K score or undergo a biopsy at this time  - patient would like a  4 K score at this time  2. BPH with LUTS  - IPSS score is 5/2, it is improving  - Continue conservative management, avoiding bladder irritants and timed voiding's  - RTC   3. Erectile dysfunction  - SHIM score is 14  - Viagra was effective, but caused a headache  - I explained to the patient that in order to achieve an erection it takes good functioning of the nervous system (parasympathetic, sympathetic, sensory and motor), good blood flow into the erectile tissue of the penis and a desire to have sex  - I explained that conditions like diabetes, hypertension, coronary artery disease, peripheral vascular disease, smoking, alcohol consumption, age, sleep apnea and BPH can diminish the ability to have an erection  - We discussed trying a different PDE5 inhibitor, intra-urethral suppositories, intracavernous vasoactive drug injection therapy, vacuum constriction device and penile prosthesis implantation - he is still not interested in other therapies at this time  Return for pending 4K results.    These notes generated with voice recognition software. I apologize for typographical errors.  Zara Council, PA-C  [facility] [address], [place], [place] [address] [phone]  Addendum:  4K score is 11%.

## 2016-08-25 ENCOUNTER — Encounter: Payer: Self-pay | Admitting: Urology

## 2016-08-25 ENCOUNTER — Ambulatory Visit (INDEPENDENT_AMBULATORY_CARE_PROVIDER_SITE_OTHER): Payer: Medicaid Other | Admitting: Urology

## 2016-08-25 VITALS — BP 109/71 | HR 65 | Ht 72.0 in | Wt 237.6 lb

## 2016-08-25 DIAGNOSIS — N401 Enlarged prostate with lower urinary tract symptoms: Secondary | ICD-10-CM | POA: Diagnosis not present

## 2016-08-25 DIAGNOSIS — R972 Elevated prostate specific antigen [PSA]: Secondary | ICD-10-CM

## 2016-08-25 DIAGNOSIS — N529 Male erectile dysfunction, unspecified: Secondary | ICD-10-CM

## 2016-08-25 DIAGNOSIS — N138 Other obstructive and reflux uropathy: Secondary | ICD-10-CM

## 2016-08-25 NOTE — Progress Notes (Signed)
Nathaniel [name] CMA to draw and send off 4K score.

## 2016-08-26 ENCOUNTER — Other Ambulatory Visit: Payer: Self-pay | Admitting: Urology

## 2016-08-28 ENCOUNTER — Telehealth: Payer: Self-pay

## 2016-08-28 ENCOUNTER — Other Ambulatory Visit: Payer: Self-pay | Admitting: Urology

## 2016-08-28 NOTE — Telephone Encounter (Signed)
Spoke with pt in reference to 4K Score results and prostate bx. Pt elected to proceed with prostate bx. Prep was reviewed with pt. Pt is not currently taking ASA or mobic. Pt stated he stopped both medications about a year ago. Pt was transferred to the front to make bx appts.

## 2016-08-28 NOTE — Telephone Encounter (Signed)
-----   Message from Nori Riis, PA-C sent at 08/28/2016  1:23 PM EDT ----- Please let Nathaniel [name] know that his 4K score result was 11%.  This means that there is an 11% probability that he has a Gleason score 7 or greater prostate cancer if a biopsy was to be performed at this time.  A prostate cancer with a Gleason score of 7 or greater is considered aggressive and would require treatment.  [employer] is the company that created the 4K score and their recommendations are that a 4K score of 7% or greater should undergo a prostate biopsy.

## 2016-09-10 ENCOUNTER — Other Ambulatory Visit: Payer: Medicaid Other | Admitting: Urology

## 2016-09-25 ENCOUNTER — Ambulatory Visit: Payer: Medicaid Other

## 2016-11-13 ENCOUNTER — Other Ambulatory Visit: Payer: Medicaid Other

## 2016-11-20 ENCOUNTER — Ambulatory Visit: Payer: Medicaid Other

## 2017-01-22 ENCOUNTER — Encounter: Payer: Self-pay | Admitting: Urology

## 2017-01-22 ENCOUNTER — Ambulatory Visit (INDEPENDENT_AMBULATORY_CARE_PROVIDER_SITE_OTHER): Payer: Medicaid Other | Admitting: Urology

## 2017-01-22 VITALS — BP 125/77 | HR 83 | Ht 72.0 in | Wt 237.9 lb

## 2017-01-22 DIAGNOSIS — N529 Male erectile dysfunction, unspecified: Secondary | ICD-10-CM

## 2017-01-22 DIAGNOSIS — R972 Elevated prostate specific antigen [PSA]: Secondary | ICD-10-CM | POA: Diagnosis not present

## 2017-01-22 NOTE — Progress Notes (Signed)
01/22/2017 1:19 PM   Nathaniel [name] 13-Jan-1955 [id number]  Referring provider: Ellamae Sia, MD [address] [place], [place] [address]  No chief complaint on file.   HPI: 62 yo WM with an elevated PSA, BPH with LU TS and ED who presents today for a 6 month follow up.    1. Elevated PSA Patient was referred by Dr. Quay Burow for an elevated PSA.  Patient was found to have a PSA of 4.6 ng/mL on 02/07/2016 and a repeated PSA of 5, 2 weeks later.  Last PSA was 5.8 ng/mL on 08/22/2016.    He underwent a 4K score August 2018 which showed 11% probability of having a Gleason score 7 or higher prostate cancer for biopsy was to be performed.  He was scheduled to undergo a prostate biopsy but never followed up for this. PSA then rose to 8.9 in January 2019.  2. Erectile dysfunction His SHIM score was 14 at last visit, which is mild to moderate ED.   His previous SHIM score was 16.  He has been having difficulty with erections for few years.   His major complaint is maintaining an erections.  His libido is preserved.   His risk factors for ED are age, BPH, and blood pressure medications.    He was not interested in treatment at his last visit.  This was not discussed at this visit due to the severity of above.      PMH: Past Medical History:  Diagnosis Date  . Arthritis   . Chronic back pain    Dr. Quay Burow manages (per pt)  . COPD (chronic obstructive pulmonary disease) ([place])   . Hearing loss   . Hyperlipidemia   . Hypertension   . MGUS (monoclonal gammopathy of unknown significance)   . Multiple myeloma ([place])   . Proteinuria   . Tobacco abuse     Surgical History: Past Surgical History:  Procedure Laterality Date  . COLONOSCOPY WITH PROPOFOL N/A 07/04/2014   Procedure: COLONOSCOPY WITH PROPOFOL;  Surgeon: Lucilla Lame, MD;  Location: [facility] ENDOSCOPY;  Service: Endoscopy;  Laterality: N/A;  . TONSILLECTOMY     as child    Home Medications:  Allergies as of 01/22/2017     Reactions   Albuterol Other (See Comments)   Muscle cramps      Medication List        Accurate as of 01/22/17  1:19 PM. Always use your most recent med list.          albuterol 108 (90 Base) MCG/ACT inhaler Commonly known as:  PROVENTIL HFA;VENTOLIN HFA Inhale 2 puffs into the lungs every 6 (six) hours as needed for wheezing or shortness of breath.   albuterol 108 (90 Base) MCG/ACT inhaler Commonly known as:  PROVENTIL HFA;VENTOLIN HFA Inhale into the lungs.   aspirin EC 81 MG tablet Take 81 mg by mouth.   cetirizine 10 MG tablet Commonly known as:  ZYRTEC Take 10 mg by mouth.   enalapril 10 MG tablet Commonly known as:  VASOTEC Take 1 tablet by mouth at bedtime.   fluticasone 50 MCG/ACT nasal spray Commonly known as:  FLONASE 1 spray by Each Nare route two (2) times a day as needed. Frequency:BID   Dosage:50   MCG  Instructions:  Note:Dose: 50MCG   hydrocortisone cream 1 % Apply 1 application topically 2 (two) times daily as needed.   levalbuterol 45 MCG/ACT inhaler Commonly known as:  XOPENEX HFA Inhale into the lungs.  meloxicam 7.5 MG tablet Commonly known as:  MOBIC Take 15 mg by mouth.   Na Sulfate-K Sulfate-Mg Sulf 17.5-3.13-1.6 GM/177ML Soln Commonly known as:  SUPREP BOWEL PREP KIT Take 1 kit by mouth as directed.   oxyCODONE-acetaminophen 5-325 MG tablet Commonly known as:  PERCOCET/ROXICET Take 1 tablet by mouth every 6 (six) hours as needed.   pravastatin 10 MG tablet Commonly known as:  PRAVACHOL Take 10 mg by mouth daily.   predniSONE 20 MG tablet Commonly known as:  DELTASONE Take 40 mg by mouth.   sildenafil 100 MG tablet Commonly known as:  VIAGRA Take 1 tablet by mouth as needed.   SPIRIVA HANDIHALER 18 MCG inhalation capsule Generic drug:  tiotropium Spiriva with HandiHaler 18 mcg and inhalation capsules  PLACE 1 CAPSULE (18 MCG TOTAL) INTO INHALER AND INHALE DAILY.   SPIRIVA HANDIHALER 18 MCG inhalation capsule Generic  drug:  tiotropium Place into inhaler and inhale.   SYMBICORT 160-4.5 MCG/ACT inhaler Generic drug:  budesonide-formoterol Inhale into the lungs.   Tiotropium Bromide-Olodaterol 2.5-2.5 MCG/ACT Aers Inhale into the lungs.       Allergies:  Allergies  Allergen Reactions  . Albuterol Other (See Comments)    Muscle cramps    Family History: Family History  Problem Relation Age of Onset  . Heart disease Brother 24       MI  . Colon cancer Neg Hx   . Liver disease Neg Hx   . Prostate cancer Neg Hx   . Kidney cancer Neg Hx   . Bladder Cancer Neg Hx     Social History:  reports that he quit smoking about 18 months ago. His smoking use included cigarettes. He quit after 45.00 years of use. he has never used smokeless tobacco. He reports that he does not drink alcohol or use drugs.  ROS:                                        Physical Exam: There were no vitals taken for this visit.  Constitutional:  Alert and oriented, No acute distress. HEENT: [place] AT, moist mucus membranes.  Trachea midline, no masses. Cardiovascular: No clubbing, cyanosis, or edema. Respiratory: Normal respiratory effort, no increased work of breathing. GI: Abdomen is soft, nontender, nondistended, no abdominal masses GU: No CVA tenderness.  DRE: 2+ smooth however the right side does appear more indurated than the left. Skin: No rashes, bruises or suspicious lesions. Lymph: No cervical or inguinal adenopathy. Neurologic: Grossly intact, no focal deficits, moving all 4 extremities. Psychiatric: Normal mood and affect.  Laboratory Data: No results found for: WBC, HGB, HCT, MCV, PLT  No results found for: CREATININE  No results found for: PSA  No results found for: TESTOSTERONE  No results found for: HGBA1C  Urinalysis No results found for: COLORURINE, APPEARANCEUR, LABSPEC, PHURINE, GLUCOSEU, HGBUR, BILIRUBINUR, KETONESUR, PROTEINUR, UROBILINOGEN, NITRITE,  LEUKOCYTESUR   Assessment & Plan:    1.  Elevated PSA with indurated right lobe I discussed the patient that his PSA more than doubled that under a year and is prostate exam is somewhat concerning.  I am very concerned that he has prostate cancer at this time.  He has been hesitant to undergo prostate biopsies in the past.  It is very blunt with him that this is absolutely so that I would strongly encourage especially giving his exam findings and rapid doubling time.  We discussed the risk, benefits, indications of a prostate biopsy.  He understands the risks include having blood in his stool and urine for up to 48 hours in his semen for up to 6 weeks.  We also discussed the risk of infection of approximately 1-2% despite periprocedural antibiotics requiring IV antibiotics and hospitalization for sepsis.  All questions were answered.  The patient has agreed to proceed with a prostate biopsy at this time.  No Follow-up on file.  Nickie Retort, MD  [facility] [address], [place], [place] [address] [phone]

## 2017-02-06 ENCOUNTER — Ambulatory Visit (INDEPENDENT_AMBULATORY_CARE_PROVIDER_SITE_OTHER): Payer: Medicaid Other | Admitting: Urology

## 2017-02-06 ENCOUNTER — Encounter: Payer: Self-pay | Admitting: Urology

## 2017-02-06 ENCOUNTER — Other Ambulatory Visit: Payer: Self-pay | Admitting: Urology

## 2017-02-06 VITALS — BP 120/84 | HR 92 | Ht 72.0 in | Wt 234.0 lb

## 2017-02-06 DIAGNOSIS — R972 Elevated prostate specific antigen [PSA]: Secondary | ICD-10-CM | POA: Diagnosis not present

## 2017-02-06 MED ORDER — LIDOCAINE HCL 2 % EX GEL
1.0000 "application " | Freq: Once | CUTANEOUS | Status: AC
  Administered 2017-02-06: 1 via URETHRAL

## 2017-02-06 MED ORDER — LEVOFLOXACIN 500 MG PO TABS
500.0000 mg | ORAL_TABLET | Freq: Once | ORAL | Status: AC
  Administered 2017-02-06: 500 mg via ORAL

## 2017-02-06 MED ORDER — GENTAMICIN SULFATE 40 MG/ML IJ SOLN
80.0000 mg | Freq: Once | INTRAMUSCULAR | Status: AC
  Administered 2017-02-06: 80 mg via INTRAMUSCULAR

## 2017-02-06 NOTE — Progress Notes (Signed)
Prostate Biopsy Procedure   Informed consent was obtained after discussing risks/benefits of the procedure.  A time out was performed to ensure correct patient identity.  Pre-Procedure: - Last PSA Level: 8.9 - Gentamicin given prophylactically - Levaquin 500 mg administered PO -Transrectal Ultrasound performed revealing a 38.65 gm prostate -No significant hypoechoic or median lobe noted  Procedure: - Prostate block performed using 10 cc 1% lidocaine and biopsies taken from sextant areas, a total of 12 under ultrasound guidance.  Post-Procedure: - Patient tolerated the procedure well - He was counseled to seek immediate medical attention if experiences any severe pain, significant bleeding, or fevers - Return in one week to discuss biopsy results

## 2017-02-14 LAB — PATHOLOGY REPORT

## 2017-02-17 ENCOUNTER — Other Ambulatory Visit: Payer: Self-pay | Admitting: Urology

## 2017-02-19 ENCOUNTER — Ambulatory Visit: Payer: Medicaid Other

## 2017-02-20 ENCOUNTER — Encounter: Payer: Self-pay | Admitting: Urology

## 2017-02-20 ENCOUNTER — Ambulatory Visit (INDEPENDENT_AMBULATORY_CARE_PROVIDER_SITE_OTHER): Payer: Medicaid Other | Admitting: Urology

## 2017-02-20 VITALS — BP 140/90 | HR 98 | Ht 72.0 in | Wt 232.0 lb

## 2017-02-20 DIAGNOSIS — C61 Malignant neoplasm of prostate: Secondary | ICD-10-CM | POA: Diagnosis not present

## 2017-02-20 NOTE — Progress Notes (Signed)
02/20/2017 3:05 PM   Nathaniel [name] 1955/01/08 [id number]  Referring provider: Ellamae Sia, MD [address] [place], [place] [address]  Chief Complaint  Patient presents with  . Follow-up    BX results    HPI: The patient is a 62 year old gentleman who presents today to discuss prostate biopsy results.  1. Intermediate risk prostate cancer New diagnosis of gleason 3 + 4 = 7 prostate cancer in 2 of 12 cores.  Patient was found to have a PSA of 4.6 ng/mL in Febrary 2018 with repeat PSA of 5. PSA was 5.8 ng/mL in August 2018. He remained hesitant to undergo a prostate biopsy.  He underwent a 4K score August 2018 which showed 11% probability of having a Gleason score 7 or higher prostate cancer if a biopsy was to be performed.  He was scheduled to undergo a prostate biopsy but never followed up for this. PSA then rose to 8.9 in January 2019.  Pathology: Gleason 3 + 4 = 7 in one core (53%) at [facility] Gleason 3 + 3 = 7 in one core (3%) at [facility] PSA at time of diagnosis: 8.9 DRE without nodules but apparent greater induration on the right compared to left  Of note, he does have significant COPD requiring 3 medications.  He does not use oxygen.  He has an appointment scheduled in the near future for a possible lung reduction surgery.  He has required pulmonary rehab and does have shortness of breath at times after walking.  2. Erectile dysfunction His previous SHIM score was 16 in 2018.He has been having difficulty with erections for few years. His major complaint is maintaining an erections. Reports that he is not currently taking medications   PMH: Past Medical History:  Diagnosis Date  . Arthritis   . Chronic back pain    Dr. Quay Burow manages (per pt)  . COPD (chronic obstructive pulmonary disease) ([place])   . Hearing loss   . Hyperlipidemia   . Hypertension   . MGUS (monoclonal gammopathy of unknown significance)   . Multiple myeloma ([place])   . Proteinuria   .  Tobacco abuse     Surgical History: Past Surgical History:  Procedure Laterality Date  . COLONOSCOPY WITH PROPOFOL N/A 07/04/2014   Procedure: COLONOSCOPY WITH PROPOFOL;  Surgeon: Lucilla Lame, MD;  Location: [facility] ENDOSCOPY;  Service: Endoscopy;  Laterality: N/A;  . TONSILLECTOMY     as child    Home Medications:  Allergies as of 02/20/2017      Reactions   Albuterol Other (See Comments)   Muscle cramps      Medication List        Accurate as of 02/20/17  3:05 PM. Always use your most recent med list.          albuterol 108 (90 Base) MCG/ACT inhaler Commonly known as:  PROVENTIL HFA;VENTOLIN HFA Inhale 2 puffs into the lungs every 6 (six) hours as needed for wheezing or shortness of breath.   cetirizine 10 MG tablet Commonly known as:  ZYRTEC Take 10 mg by mouth.   fluticasone 50 MCG/ACT nasal spray Commonly known as:  FLONASE 1 spray by Each Nare route two (2) times a day as needed. Frequency:BID   Dosage:50   MCG  Instructions:  Note:Dose: 50MCG   Fluticasone-Umeclidin-Vilant 100-62.5-25 MCG/INH Aepb Inhale into the lungs.   hydrocortisone cream 1 % Apply 1 application topically 2 (two) times daily as needed.   levalbuterol 45 MCG/ACT inhaler Commonly known  as:  XOPENEX HFA Inhale into the lungs.   pravastatin 10 MG tablet Commonly known as:  PRAVACHOL Take 10 mg by mouth daily.   sildenafil 100 MG tablet Commonly known as:  VIAGRA Take 1 tablet by mouth as needed.   SPIRIVA HANDIHALER 18 MCG inhalation capsule Generic drug:  tiotropium Place into inhaler and inhale.   SYMBICORT 160-4.5 MCG/ACT inhaler Generic drug:  budesonide-formoterol Inhale into the lungs.   Tiotropium Bromide-Olodaterol 2.5-2.5 MCG/ACT Aers Inhale into the lungs.       Allergies:  Allergies  Allergen Reactions  . Albuterol Other (See Comments)    Muscle cramps    Family History: Family History  Problem Relation Age of Onset  . Heart disease Brother 67       MI  .  Colon cancer Neg Hx   . Liver disease Neg Hx   . Prostate cancer Neg Hx   . Kidney cancer Neg Hx   . Bladder Cancer Neg Hx     Social History:  reports that he quit smoking about 19 months ago. His smoking use included cigarettes. He quit after 45.00 years of use. he has never used smokeless tobacco. He reports that he does not drink alcohol or use drugs.  ROS: UROLOGY Frequent Urination?: No Hard to postpone urination?: No Burning/pain with urination?: No Get up at night to urinate?: No Leakage of urine?: No Urine stream starts and stops?: No Trouble starting stream?: No Do you have to strain to urinate?: No Blood in urine?: No Urinary tract infection?: No Sexually transmitted disease?: No Injury to kidneys or bladder?: No Painful intercourse?: No Weak stream?: No Erection problems?: No Penile pain?: No  Gastrointestinal Nausea?: No Vomiting?: No Indigestion/heartburn?: No Diarrhea?: No Constipation?: No  Constitutional Fever: No Night sweats?: No Weight loss?: No Fatigue?: No  Skin Skin rash/lesions?: No Itching?: No  Eyes Blurred vision?: No Double vision?: No  Ears/Nose/Throat Sore throat?: No Sinus problems?: No  Hematologic/Lymphatic Swollen glands?: No Easy bruising?: No  Cardiovascular Leg swelling?: No Chest pain?: No  Respiratory Cough?: No Shortness of breath?: No  Endocrine Excessive thirst?: No  Musculoskeletal Back pain?: Yes Joint pain?: Yes  Neurological Headaches?: No Dizziness?: No  Psychologic Depression?: No Anxiety?: No  Physical Exam: BP 140/90 (BP Location: Right Arm, Patient Position: Sitting, Cuff Size: Large)   Pulse 98   Ht 6' (1.829 m)   Wt 232 lb (105.2 kg)   BMI 31.46 kg/m   Constitutional:  Alert and oriented, No acute distress. HEENT: [place] AT, moist mucus membranes.  Trachea midline, no masses. Cardiovascular: No clubbing, cyanosis, or edema. Respiratory: Normal respiratory effort, no increased  work of breathing. GI: Abdomen is soft, nontender, nondistended, no abdominal masses GU: No CVA tenderness.  Skin: No rashes, bruises or suspicious lesions. Lymph: No cervical or inguinal adenopathy. Neurologic: Grossly intact, no focal deficits, moving all 4 extremities. Psychiatric: Normal mood and affect.  Laboratory Data: No results found for: WBC, HGB, HCT, MCV, PLT  No results found for: CREATININE  No results found for: PSA  No results found for: TESTOSTERONE  No results found for: HGBA1C  Urinalysis No results found for: COLORURINE, APPEARANCEUR, LABSPEC, PHURINE, GLUCOSEU, HGBUR, BILIRUBINUR, KETONESUR, PROTEINUR, UROBILINOGEN, NITRITE, LEUKOCYTESUR   Assessment & Plan:    1.  Intermediate risk prostate cancer I discussed the patient in great detail his new diagnosis of intermediate risk prostate cancer.  We discussed the implications of this.  We did discuss that many who fall in this  category are generally recommended active treatment for this disease.  Despite that, we did discuss all options available to him including watchful waiting, active surveillance, robotic prostatectomy, and radiation therapy.  We did discuss that his treatment options are also affected by his comorbidity of COPD that requires 3 medications. We discussed robotic prostatectomy with pelvic lymph node dissection and radiation therapy in detail.  We did discuss the robotic prostatectomy and the risks long-term of incomplete care, erectile dysfunction, urinary incontinence in great detail.  I did share with him my concern that his significant COPD likely limits his candidacy for this at the do not think he will tolerate insufflation and the Trendelenburg position required for this procedure.  We also discussed external beam radiation versus brachytherapy.   I also shared my concern with that his PSA has almost doubled in 10 months which suggests he may have more significant disease then the biopsy reveals.   He is scheduled to discuss lung reduction surgery in the near future.  I will have him follow-up after this to discuss options further.  Pending the results of his discussion about his lungs, he may be a good candidate for brachytherapy.  I have given the 100 questions about prostate cancer booklet.   Return in about 4 weeks (around 03/20/2017).  Nickie Retort, MD  [facility] [address], [place], [place] [address] [phone]

## 2017-03-20 ENCOUNTER — Ambulatory Visit: Payer: Medicaid Other | Admitting: Urology

## 2017-06-18 ENCOUNTER — Ambulatory Visit: Payer: Medicaid Other | Admitting: Urology

## 2017-06-18 ENCOUNTER — Other Ambulatory Visit: Payer: Self-pay

## 2017-09-28 ENCOUNTER — Ambulatory Visit: Payer: Medicaid Other | Admitting: Urology

## 2017-10-19 ENCOUNTER — Ambulatory Visit (INDEPENDENT_AMBULATORY_CARE_PROVIDER_SITE_OTHER): Payer: Medicaid Other | Admitting: Urology

## 2017-10-19 ENCOUNTER — Encounter: Payer: Self-pay | Admitting: Urology

## 2017-10-19 VITALS — BP 150/82 | HR 93 | Ht 72.0 in | Wt 236.2 lb

## 2017-10-19 DIAGNOSIS — C61 Malignant neoplasm of prostate: Secondary | ICD-10-CM | POA: Insufficient documentation

## 2017-10-19 NOTE — Progress Notes (Signed)
   10/19/2017 4:24 PM   [name] 10-17-1955 [id number]  Reason for visit: Follow up prostate cancer  HPI: I saw Mr. Lamere in urology clinic to discuss his diagnosis of prostate cancer.  He is a prior patient of Dr. Pilar Jarvis.  He is a 62 year old comorbid male that was diagnosed with prostate cancer in February 2019.  Gleason score was 3+4 = 7 in 1 core of the left lateral base with 53% of the core involved.  There was perineural invasion present.  There is also a positive core for Gleason score 3+3=6 with 3% core involvement in the left lateral gland.  PSA at time of diagnosis was 8.9, with concerning PSA doubling time of only 10 months.  At that time, he was possibly going to undergo pulmonary reduction surgery for his severe COPD, and Dr. Pilar Jarvis wanted to see him back in 2 to 3 months to rediscuss his prostate cancer diagnosis, however the patient did not follow-up.  ROS: Please see flowsheet from today's date for complete review of systems.  Physical Exam: BP (!) 150/82 (BP Location: Left Arm, Patient Position: Sitting, Cuff Size: Large)   Pulse 93   Ht 6' (1.829 m)   Wt 236 lb 3.2 oz (107.1 kg)   BMI 32.03 kg/m    Constitutional:  Alert and oriented, No acute distress. Respiratory: Normal respiratory effort, no increased work of breathing. GI: Abdomen is soft, nontender, nondistended, no abdominal masses GU: No CVA tenderness Skin: No rashes, bruises or suspicious lesions. Neurologic: Grossly intact, no focal deficits, moving all 4 extremities. Psychiatric: Normal mood and affect  Assessment & Plan:   In summary, Mr. Canter is a co-morbid 62 year old male with COPD diagnosed with intermediate risk prostate cancer in February 2019.  He did not follow-up for further treatment.  He is not a good candidate for robotic prostatectomy secondary to his severe COPD, and I do not feel he would tolerate the Trendelenburg position required.   We had a very long discussion today about  intermediate risk prostate cancer, and my recommendation for treatment.  I did strongly encourage him to see the radiation oncologist and provided a referral today.  With his rapidly increasing PSA, intermediate risk disease, and perineural invasion, he would not be a good candidate for active surveillance.  We discussed the risks of untreated prostate cancer including metastasis, fractures, pain, uropathy and obstruction, and death.  I had him repeat a PSA today, as her last PSA was in February 2019.  He is amenable to seeing the radiation oncologist to discuss treatment.  I scheduled a follow-up in 6 months with a repeat PSA to try to prevent him being lost to follow-up.    Return in about 6 months (around 04/20/2018) for PSA.  [place], [facility] [address] [place], [place] [address] [phone]

## 2017-10-19 NOTE — Patient Instructions (Signed)
Prostate Cancer The prostate is a walnut-sized gland that is involved in the production of semen. It is located below a man's bladder, in front of the rectum. Prostate cancer is the abnormal growth of cells in the prostate gland. What are the causes? The exact cause of this condition is not known. What increases the risk? This condition is more likely to develop in men who:  Are older than age 60.  Are African-American.  Are obese.  Have a family history of prostate cancer.  Have a family history of breast cancer.  What are the signs or symptoms? Symptoms of this condition include:  A need to urinate often.  Weak or interrupted flow of urine.  Trouble starting or stopping urination.  Inability to urinate.  Pain or burning during urination.  Painful ejaculation.  Blood in urine or semen.  Persistent pain or discomfort in the lower back, lower abdomen, hips, or upper thighs.  Trouble getting an erection.  Trouble emptying the bladder all the way.  How is this diagnosed? This condition can be diagnosed with:  A digital rectal exam. For this exam, a health care provider inserts a gloved finger into the rectum to feel the prostate gland.  A blood test called a prostate-specific antigen (PSA) test.  An imaging test called transrectal ultrasonography.  A procedure in which a sample of tissue is taken from the prostate and examined under a microscope (prostate biopsy).  Once the condition is diagnosed, tests will be done to determine how far the cancer has spread. This is called staging the cancer. Staging may involve imaging tests, such as:  A bone scan.  A CT scan.  A PET scan.  An MRI.  The stages of prostate cancer are as follows:  Stage I. At this stage, the cancer is found in the prostate only. The cancer is not visible on imaging tests and it is usually found by accident, such as during a prostate surgery.  Stage II. At this stage, the cancer is more  advanced than it is in stage I, but the cancer has not spread outside the prostate.  Stage III. At this stage, the cancer has spread beyond the outer layer of the prostate to nearby tissues. The cancer may be found in the seminal vesicles, which are near the bladder and the prostate.  Stage IV. At this stage, the cancer has spread other parts of the body, such as the lymph nodes, bones, bladder, rectum, liver, or lungs.  How is this treated? Treatment for this condition depends on several factors, including the stage of the cancer, your age, personal preferences, and your overall health. Talk with your health care provider about treatment options that are recommended for you. Common treatments include:  Observation for early stage prostate cancer (active surveillance). This involves having exams, blood tests, and in some cases, more biopsies. For some men, this is the only treatment needed.  Surgery. Types of surgeries include: ? Open surgery. In this surgery, a larger incision is made to remove the prostate. ? A laparoscopic prostatectomy. This is a surgery to remove the prostate and lymph nodes through several, small incisions. It is often referred to as a minimally invasive surgery. ? A robotic prostatectomy. This is a surgery to remove the prostate and lymph nodes with the help of a robotic arm that is controlled by a computer. ? Orchiectomy. This is a surgery to remove the testicles. ? Cryosurgery. This is a surgery to freeze and destroy cancer cells.  Radiation treatment. Types of radiation treatment include: ? External beam radiation. This type aims beams of radiation from outside the body at the prostate to destroy cancerous cells. ? Brachytherapy. This type uses radioactive needles, seeds, wires, or tubes that are implanted into the prostate gland. Like external beam radiation, brachytherapy destroys cancerous cells. An advantage is that this type of radiation limits the damage to  surrounding tissue and has fewer side effects.  High-intensity, focused ultrasonography. This treatment destroys cancer cells by delivering high-energy ultrasound waves to the cancerous cells.  Chemotherapy medicines. This treatment kills cancer cells or stops them from multiplying.  Hormone treatment. This treatment involves taking medicines that act on one of the male hormones (testosterone): ? By stopping your body from producing testosterone. ? By blocking testosterone from reaching cancer cells.  Follow these instructions at home:  Take over-the-counter and prescription medicines only as told by your health care provider.  Maintain a healthy diet.  Get plenty of sleep.  Consider joining a support group for men who have prostate cancer. Meeting with a support group may help you learn to cope with the stress of having cancer.  Keep all follow-up visits as told by your health care provider. This is important.  If you have to go to the hospital, notify your cancer specialist (oncologist).  Treatment for prostate cancer may affect sexual function. Continue to have intimate moments with your partner. This may include touching, holding, hugging, and caressing. Contact a health care provider if:  You have trouble urinating.  You have blood in your urine.  You have pain in your hips, back, or chest. Get help right away if:  You have weakness or numbness in your legs.  You have cannot control urination or your bowel movements (incontinence).  You have trouble breathing.  You have sudden chest pain.  You have chills or a fever. Summary  The prostate is a walnut-sized gland that is involved in the production of semen. It is located below a man's bladder, in front of the rectum. Prostate cancer is the abnormal growth of cells in the prostate gland.  Treatment for this condition depends on several factors, including the stage of the cancer, your age, personal preferences, and  your overall health. Talk with your health care provider about treatment options that are recommended for you.  Consider joining a support group for men who have prostate cancer. Meeting with a support group may help you learn to cope with the stress of having cancer. This information is not intended to replace advice given to you by your health care provider. Make sure you discuss any questions you have with your health care provider. Document Released: 12/23/2004 Document Revised: 09/04/2015 Document Reviewed: 09/03/2015 Elsevier Interactive Patient Education  2017 [employer].

## 2017-10-20 LAB — PSA: Prostate Specific Ag, Serum: 5.5 ng/mL — ABNORMAL HIGH (ref 0.0–4.0)

## 2017-10-26 ENCOUNTER — Other Ambulatory Visit: Payer: Self-pay

## 2017-10-26 ENCOUNTER — Encounter: Payer: Self-pay | Admitting: Radiation Oncology

## 2017-10-26 ENCOUNTER — Ambulatory Visit
Admission: RE | Admit: 2017-10-26 | Discharge: 2017-10-26 | Disposition: A | Discharge status: Home / Self Care | Payer: Medicaid Other | Source: Ambulatory Visit | Attending: Radiation Oncology | Admitting: Radiation Oncology

## 2017-10-26 VITALS — BP 143/83 | HR 83 | Temp 97.1°F | Resp 18 | Wt 235.1 lb

## 2017-10-26 DIAGNOSIS — G8929 Other chronic pain: Secondary | ICD-10-CM | POA: Diagnosis not present

## 2017-10-26 DIAGNOSIS — M549 Dorsalgia, unspecified: Secondary | ICD-10-CM | POA: Diagnosis not present

## 2017-10-26 DIAGNOSIS — J449 Chronic obstructive pulmonary disease, unspecified: Secondary | ICD-10-CM | POA: Diagnosis not present

## 2017-10-26 DIAGNOSIS — C9001 Multiple myeloma in remission: Secondary | ICD-10-CM | POA: Insufficient documentation

## 2017-10-26 DIAGNOSIS — M129 Arthropathy, unspecified: Secondary | ICD-10-CM | POA: Diagnosis not present

## 2017-10-26 DIAGNOSIS — Z79899 Other long term (current) drug therapy: Secondary | ICD-10-CM | POA: Insufficient documentation

## 2017-10-26 DIAGNOSIS — C61 Malignant neoplasm of prostate: Secondary | ICD-10-CM | POA: Insufficient documentation

## 2017-10-26 NOTE — Consult Note (Signed)
NEW PATIENT EVALUATION  Name: Nathaniel [name]  MRN: [id number]  Date:   10/26/2017     DOB: March 28, 1955   This 62 y.o. male patient presents to the clinic for initial evaluation of prostate cancer stage IIa (T1 CN 0 M0) Gleason 7 (3+4) presenting with a PSA of.5.5  REFERRING PHYSICIAN: Ellamae Sia, MD  CHIEF COMPLAINT:  Chief Complaint  Patient presents with  . Prostate Cancer    Pt is here for initial consultation of prostate cancer    DIAGNOSIS: The encounter diagnosis was Malignant neoplasm of prostate ([place]).   PREVIOUS INVESTIGATIONS:  Pathology reports reviewed No bone scan performed based on low PSA Clinical notes reviewed  HPI: patient is a 62 year old male originally consult and back in February 2019 when based on rising PSA had a transrectal ultrasound-guided biopsy showing 2 cores positive for adenocarcinoma left lateral base was Gleason 7 (3+4). He is at that that time of diagnosis was 8.9. Patient opted for watchful waiting. He has significant COPD and has been referred for possible pulmonary reduction surgery. He also history has a history of multiple myeloma although according to the wife he says this is in remission and has spontaneously resolved?he is seen today after meeting with urology to discuss radiation oncology options not thought to be a surgical candidate based on his multiple medical comorbidities. He does have an FEV1 of approximate 30% of predicted.  PLANNED TREATMENT REGIMEN: image guided I MRT radiation therapy  PAST MEDICAL HISTORY:  has a past medical history of Arthritis, Chronic back pain, COPD (chronic obstructive pulmonary disease) ([place]), Hearing loss, Hyperlipidemia, Hypertension, MGUS (monoclonal gammopathy of unknown significance), Multiple myeloma ([place]), Proteinuria, and Tobacco abuse.    PAST SURGICAL HISTORY:  Past Surgical History:  Procedure Laterality Date  . COLONOSCOPY WITH PROPOFOL N/A 07/04/2014   Procedure: COLONOSCOPY WITH  PROPOFOL;  Surgeon: Lucilla Lame, MD;  Location: [facility] ENDOSCOPY;  Service: Endoscopy;  Laterality: N/A;  . TONSILLECTOMY     as child    FAMILY HISTORY: family history includes Heart disease (age of onset: 21) in his brother.  SOCIAL HISTORY:  reports that he quit smoking about 2 years ago. His smoking use included cigarettes. He quit after 50.00 years of use. He has never used smokeless tobacco. He reports that he does not drink alcohol or use drugs.  ALLERGIES: Albuterol  MEDICATIONS:  Current Outpatient Medications  Medication Sig Dispense Refill  . albuterol (PROVENTIL HFA;VENTOLIN HFA) 108 (90 BASE) MCG/ACT inhaler Inhale 2 puffs into the lungs every 6 (six) hours as needed for wheezing or shortness of breath.    . beclomethasone (QVAR) 80 MCG/ACT inhaler Inhale into the lungs.    . budesonide-formoterol (SYMBICORT) 160-4.5 MCG/ACT inhaler Inhale into the lungs.    . budesonide-formoterol (SYMBICORT) 160-4.5 MCG/ACT inhaler Symbicort 160 mcg-4.5 mcg/actuation HFA aerosol inhaler  INHALE 2 PUFFS TWO TIMES A DAY.    . cetirizine (ZYRTEC) 10 MG tablet Take 10 mg by mouth.    . fluticasone (FLONASE) 50 MCG/ACT nasal spray 1 spray by Each Nare route two (2) times a day as needed. Frequency:BID   Dosage:50   MCG  Instructions:  Note:Dose: 50MCG    . Fluticasone-Umeclidin-Vilant 100-62.5-25 MCG/INH AEPB Inhale into the lungs.    . hydrocortisone cream 1 % Apply 1 application topically 2 (two) times daily as needed.    . Ipratropium-Albuterol (COMBIVENT RESPIMAT) 20-100 MCG/ACT AERS respimat Combivent Respimat 20 mcg-100 mcg/actuation solution for inhalation  INHALE 1 PUFF EVERY 6 HOURS AS  NEEDED (MAX 6PUFFS/24 HOURS)    . meloxicam (MOBIC) 15 MG tablet meloxicam 15 mg tablet  TAKE 1 TABLET BY MOUTH DAILY WITH MEALS    . oxyCODONE-acetaminophen (PERCOCET/ROXICET) 5-325 MG tablet oxycodone-acetaminophen 5 mg-325 mg tablet  TAKE 1 TABLET FOUR TIMES A DAY    . pravastatin (PRAVACHOL) 10 MG  tablet Take 10 mg by mouth daily.    . sildenafil (VIAGRA) 100 MG tablet Take 1 tablet by mouth as needed.    . tiotropium (SPIRIVA HANDIHALER) 18 MCG inhalation capsule Place into inhaler and inhale.    . levalbuterol (XOPENEX HFA) 45 MCG/ACT inhaler Inhale into the lungs.     No current facility-administered medications for this encounter.     ECOG PERFORMANCE STATUS:  0 - Asymptomatic  REVIEW OF SYSTEMS: patient does have chronic back pain as well as some difficulty breathing.  Patient denies any weight loss, fatigue, weakness, fever, chills or night sweats. Patient denies any loss of vision, blurred vision. Patient denies any ringing  of the ears or hearing loss. No irregular heartbeat. Patient denies heart murmur or history of fainting. Patient denies any chest pain or pain radiating to her upper extremities. Patient denies any shortness of breath, difficulty breathing at night, cough or hemoptysis. Patient denies any swelling in the lower legs. Patient denies any nausea vomiting, vomiting of blood, or coffee ground material in the vomitus. Patient denies any stomach pain. Patient states has had normal bowel movements no significant constipation or diarrhea. Patient denies any dysuria, hematuria or significant nocturia. Patient denies any problems walking, swelling in the joints or loss of balance. Patient denies any skin changes, loss of hair or loss of weight. Patient denies any excessive worrying or anxiety or significant depression. Patient denies any problems with insomnia. Patient denies excessive thirst, polyuria, polydipsia. Patient denies any swollen glands, patient denies easy bruising or easy bleeding. Patient denies any recent infections, allergies or URI. Patient "s visual fields have not changed significantly in recent time.   PHYSICAL EXAM: BP (!) 143/83 (BP Location: Left Arm, Patient Position: Sitting)   Pulse 83   Temp (!) 97.1 F (36.2 C) (Tympanic)   Resp 18   Wt 235 lb  1.9 oz (106.7 kg)   BMI 31.89 kg/m  On rectal exam rectal sphincter tone is good exam is difficult based on body's habitus no discrete nodularity masses appreciated sulcus is preserved bilaterally.Well-developed well-nourished patient in NAD. HEENT reveals PERLA, EOMI, discs not visualized.  Oral cavity is clear. No oral mucosal lesions are identified. Neck is clear without evidence of cervical or supraclavicular adenopathy. Lungs are clear to A&P. Cardiac examination is essentially unremarkable with regular rate and rhythm without murmur rub or thrill. Abdomen is benign with no organomegaly or masses noted. Motor sensory and DTR levels are equal and symmetric in the upper and lower extremities. Cranial nerves II through XII are grossly intact. Proprioception is intact. No peripheral adenopathy or edema is identified. No motor or sensory levels are noted. Crude visual fields are within normal range.  LABORATORY DATA: pathology reports reviewed    RADIOLOGY RESULTS:no current films for review   IMPRESSION: stage IIa Leeson 7 (3+4) adenocarcinoma the prostate in 62 year old male with multiple medical comorbidities  PLAN: at this time I do not think the patient will be a good candidate for general anesthesia so I would exclude I-125 interstitial implant. I would like to go ahead with image guided I MRT radiation therapy to 8000 cGy over 8 weeks to  his prostate. I have run the [facility] nomogram showing 55% chance of organ confined disease and only 2% chance of lymph node involvement. Risks and benefits of external beam radiation therapy including increased lower urinary tract symptoms diarrhea fatigue alteration of blood counts and possible erectile dysfunction all were discussed with the patient and his wife. Patient would like a third opinion with oncology at [facility] where he is followed for his multiple myeloma. Patient will arrange that I have given the patient 1 month follow-up to discuss  proceeding with treatment. If he decides to go ahead with treatment would have fiduciary markers placed in his prostate for daily image guided treatment. They both seem to comprehend my treatment plan well.  I would like to take this opportunity to thank you for allowing me to participate in the care of your patient.Noreene Filbert, MD

## 2017-11-26 ENCOUNTER — Ambulatory Visit
Admission: RE | Admit: 2017-11-26 | Discharge: 2017-11-26 | Disposition: A | Discharge status: Home / Self Care | Payer: Medicaid Other | Source: Ambulatory Visit | Attending: Radiation Oncology | Admitting: Radiation Oncology

## 2017-11-26 ENCOUNTER — Other Ambulatory Visit: Payer: Self-pay

## 2017-11-26 ENCOUNTER — Encounter: Payer: Self-pay | Admitting: Radiation Oncology

## 2017-11-26 VITALS — BP 146/82 | HR 86 | Resp 18 | Wt 234.5 lb

## 2017-11-26 DIAGNOSIS — Z923 Personal history of irradiation: Secondary | ICD-10-CM | POA: Insufficient documentation

## 2017-11-26 DIAGNOSIS — C61 Malignant neoplasm of prostate: Secondary | ICD-10-CM

## 2017-11-26 NOTE — Progress Notes (Signed)
Radiation Oncology Follow up Note  Name: Nathaniel [name]   Date:   11/26/2017 MRN:  [id number] DOB: 07-01-1955    This 62 y.o. male presents to the clinic today for reevaluation for stage IIa adenocarcinoma the prostate.  REFERRING PROVIDER: Ellamae Sia, MD  HPI: patient is a 62 year old male originally consult back in October 2019. At that time he had stage IIa disease(T1 CN 0 M0) Gleason 7 (3+4) presenting the PSA 5.5. Patient was taking of his options is now decided to go ahead with external beam radiation therapy image guided. He is continues to do fairly well without significant increased lower tract symptoms.  COMPLICATIONS OF TREATMENT: none  FOLLOW UP COMPLIANCE: keeps appointments   PHYSICAL EXAM:  BP (!) 146/82 (BP Location: Left Arm, Patient Position: Sitting)   Pulse 86   Resp 18   Wt 234 lb 7.4 oz (106.4 kg)   BMI 31.80 kg/m  Well-developed well-nourished patient in NAD. HEENT reveals PERLA, EOMI, discs not visualized.  Oral cavity is clear. No oral mucosal lesions are identified. Neck is clear without evidence of cervical or supraclavicular adenopathy. Lungs are clear to A&P. Cardiac examination is essentially unremarkable with regular rate and rhythm without murmur rub or thrill. Abdomen is benign with no organomegaly or masses noted. Motor sensory and DTR levels are equal and symmetric in the upper and lower extremities. Cranial nerves II through XII are grossly intact. Proprioception is intact. No peripheral adenopathy or edema is identified. No motor or sensory levels are noted. Crude visual fields are within normal range.  RADIOLOGY RESULTS: no current films for review  PLAN: this time I last urology to place fiduciary markers for daily image guided treatment. Would plan on delivering 8000 cGy to his prostate excluded pelvic nodes. Risks and benefits of treatment again including increased lower urinary tract symptoms diarrhea fatigue alteration of blood counts  skin reaction all were discussed in detail. Patient compresses my treatment plan well we'll set him up for simulation shortly after his markers are placed he is going on vacation in January we plan on starting treatments after his return.  I would like to take this opportunity to thank you for allowing me to participate in the care of your patient.Noreene Filbert, MD

## 2017-12-16 ENCOUNTER — Ambulatory Visit (INDEPENDENT_AMBULATORY_CARE_PROVIDER_SITE_OTHER): Payer: Medicaid Other | Admitting: Urology

## 2017-12-16 ENCOUNTER — Encounter: Payer: Self-pay | Admitting: Urology

## 2017-12-16 VITALS — BP 115/79 | HR 81 | Ht 72.0 in | Wt 236.0 lb

## 2017-12-16 DIAGNOSIS — C61 Malignant neoplasm of prostate: Secondary | ICD-10-CM | POA: Diagnosis not present

## 2017-12-16 NOTE — Progress Notes (Addendum)
12/16/2017  CC: gold seed markers  HPI: 62 y.o. male with prostate cancer who presents today for placement of fiducial seed markers in anticipation of his upcoming IMRT with Dr. Baruch Gouty.  Prostate Gold Seed Marker Placement Procedure   Informed consent was obtained after discussing risks/benefits of the procedure.  A time out was performed to ensure correct patient identity.  Pre-Procedure: - Gentamicin given prophylactically - PO Levaquin 500 mg also given today  Procedure: -Lidocaine jelly was administered per rectum -Rectal ultrasound probe was placed without difficulty and the prostate visualized - 3 fiducial gold seed markers placed, one at right base, one at left base, one at apex of prostate gland under transrectal ultrasound guidance  Post-Procedure: - Patient tolerated the procedure well - He was counseled to seek immediate medical attention if experiences any severe pain, significant bleeding, or fevers  I, Nethusan Sivanesan, am acting as a scribe for Dr. Nicki Reaper C. Birttany Dechellis,  I, Abbie Sons, MD, have reviewed all documentation for this visit. The documentation on 12/16/17 for the exam, diagnosis, procedures, and orders are all accurate and complete.   John Giovanni, MD

## 2017-12-19 DIAGNOSIS — I2510 Atherosclerotic heart disease of native coronary artery without angina pectoris: Secondary | ICD-10-CM | POA: Insufficient documentation

## 2017-12-21 ENCOUNTER — Ambulatory Visit
Admission: RE | Admit: 2017-12-21 | Discharge: 2017-12-21 | Disposition: A | Discharge status: Home / Self Care | Payer: Medicaid Other | Source: Ambulatory Visit | Attending: Radiation Oncology | Admitting: Radiation Oncology

## 2017-12-21 DIAGNOSIS — Z510 Encounter for antineoplastic radiation therapy: Secondary | ICD-10-CM | POA: Insufficient documentation

## 2017-12-21 DIAGNOSIS — C61 Malignant neoplasm of prostate: Secondary | ICD-10-CM | POA: Diagnosis not present

## 2017-12-22 DIAGNOSIS — Z510 Encounter for antineoplastic radiation therapy: Secondary | ICD-10-CM | POA: Diagnosis not present

## 2018-01-15 ENCOUNTER — Other Ambulatory Visit: Payer: Self-pay | Admitting: *Deleted

## 2018-01-15 DIAGNOSIS — C61 Malignant neoplasm of prostate: Secondary | ICD-10-CM

## 2018-01-20 ENCOUNTER — Ambulatory Visit
Admission: RE | Admit: 2018-01-20 | Discharge: 2018-01-20 | Disposition: A | Discharge status: Home / Self Care | Payer: Medicaid Other | Source: Ambulatory Visit | Attending: Radiation Oncology | Admitting: Radiation Oncology

## 2018-01-20 DIAGNOSIS — Z510 Encounter for antineoplastic radiation therapy: Secondary | ICD-10-CM | POA: Insufficient documentation

## 2018-01-20 DIAGNOSIS — C61 Malignant neoplasm of prostate: Secondary | ICD-10-CM | POA: Insufficient documentation

## 2018-01-21 ENCOUNTER — Ambulatory Visit
Admission: RE | Admit: 2018-01-21 | Discharge: 2018-01-21 | Disposition: A | Discharge status: Home / Self Care | Payer: Medicaid Other | Source: Ambulatory Visit | Attending: Radiation Oncology | Admitting: Radiation Oncology

## 2018-01-21 DIAGNOSIS — Z510 Encounter for antineoplastic radiation therapy: Secondary | ICD-10-CM | POA: Diagnosis present

## 2018-01-21 DIAGNOSIS — C61 Malignant neoplasm of prostate: Secondary | ICD-10-CM | POA: Diagnosis not present

## 2018-01-22 ENCOUNTER — Ambulatory Visit
Admission: RE | Admit: 2018-01-22 | Discharge: 2018-01-22 | Disposition: A | Discharge status: Home / Self Care | Payer: Medicaid Other | Source: Ambulatory Visit | Attending: Radiation Oncology | Admitting: Radiation Oncology

## 2018-01-22 DIAGNOSIS — Z510 Encounter for antineoplastic radiation therapy: Secondary | ICD-10-CM | POA: Diagnosis not present

## 2018-01-25 ENCOUNTER — Ambulatory Visit
Admission: RE | Admit: 2018-01-25 | Discharge: 2018-01-25 | Disposition: A | Discharge status: Home / Self Care | Payer: Medicaid Other | Source: Ambulatory Visit | Attending: Radiation Oncology | Admitting: Radiation Oncology

## 2018-01-25 DIAGNOSIS — Z510 Encounter for antineoplastic radiation therapy: Secondary | ICD-10-CM | POA: Diagnosis not present

## 2018-01-26 ENCOUNTER — Ambulatory Visit
Admission: RE | Admit: 2018-01-26 | Discharge: 2018-01-26 | Disposition: A | Discharge status: Home / Self Care | Payer: Medicaid Other | Source: Ambulatory Visit | Attending: Radiation Oncology | Admitting: Radiation Oncology

## 2018-01-26 DIAGNOSIS — Z510 Encounter for antineoplastic radiation therapy: Secondary | ICD-10-CM | POA: Diagnosis not present

## 2018-01-27 ENCOUNTER — Ambulatory Visit
Admission: RE | Admit: 2018-01-27 | Discharge: 2018-01-27 | Disposition: A | Discharge status: Home / Self Care | Payer: Medicaid Other | Source: Ambulatory Visit | Attending: Radiation Oncology | Admitting: Radiation Oncology

## 2018-01-27 DIAGNOSIS — Z510 Encounter for antineoplastic radiation therapy: Secondary | ICD-10-CM | POA: Diagnosis not present

## 2018-01-28 ENCOUNTER — Ambulatory Visit
Admission: RE | Admit: 2018-01-28 | Discharge: 2018-01-28 | Disposition: A | Discharge status: Home / Self Care | Payer: Medicaid Other | Source: Ambulatory Visit | Attending: Radiation Oncology | Admitting: Radiation Oncology

## 2018-01-28 DIAGNOSIS — Z510 Encounter for antineoplastic radiation therapy: Secondary | ICD-10-CM | POA: Diagnosis not present

## 2018-01-29 ENCOUNTER — Ambulatory Visit
Admission: RE | Admit: 2018-01-29 | Discharge: 2018-01-29 | Disposition: A | Discharge status: Home / Self Care | Payer: Medicaid Other | Source: Ambulatory Visit | Attending: Radiation Oncology | Admitting: Radiation Oncology

## 2018-01-29 DIAGNOSIS — Z510 Encounter for antineoplastic radiation therapy: Secondary | ICD-10-CM | POA: Diagnosis not present

## 2018-02-01 ENCOUNTER — Ambulatory Visit
Admission: RE | Admit: 2018-02-01 | Discharge: 2018-02-01 | Disposition: A | Discharge status: Home / Self Care | Payer: Medicaid Other | Source: Ambulatory Visit | Attending: Radiation Oncology | Admitting: Radiation Oncology

## 2018-02-01 DIAGNOSIS — Z510 Encounter for antineoplastic radiation therapy: Secondary | ICD-10-CM | POA: Diagnosis not present

## 2018-02-02 ENCOUNTER — Ambulatory Visit
Admission: RE | Admit: 2018-02-02 | Discharge: 2018-02-02 | Disposition: A | Discharge status: Home / Self Care | Payer: Medicaid Other | Source: Ambulatory Visit | Attending: Radiation Oncology | Admitting: Radiation Oncology

## 2018-02-02 DIAGNOSIS — Z510 Encounter for antineoplastic radiation therapy: Secondary | ICD-10-CM | POA: Diagnosis not present

## 2018-02-03 ENCOUNTER — Ambulatory Visit
Admission: RE | Admit: 2018-02-03 | Discharge: 2018-02-03 | Disposition: A | Discharge status: Home / Self Care | Payer: Medicaid Other | Source: Ambulatory Visit | Attending: Radiation Oncology | Admitting: Radiation Oncology

## 2018-02-03 DIAGNOSIS — Z510 Encounter for antineoplastic radiation therapy: Secondary | ICD-10-CM | POA: Diagnosis not present

## 2018-02-04 ENCOUNTER — Ambulatory Visit
Admission: RE | Admit: 2018-02-04 | Discharge: 2018-02-04 | Disposition: A | Discharge status: Home / Self Care | Payer: Medicaid Other | Source: Ambulatory Visit | Attending: Radiation Oncology | Admitting: Radiation Oncology

## 2018-02-04 ENCOUNTER — Inpatient Hospital Stay: Payer: Medicaid Other | Attending: Radiation Oncology

## 2018-02-04 ENCOUNTER — Encounter (INDEPENDENT_AMBULATORY_CARE_PROVIDER_SITE_OTHER): Payer: Self-pay

## 2018-02-04 DIAGNOSIS — Z510 Encounter for antineoplastic radiation therapy: Secondary | ICD-10-CM | POA: Diagnosis not present

## 2018-02-04 DIAGNOSIS — C61 Malignant neoplasm of prostate: Secondary | ICD-10-CM | POA: Diagnosis not present

## 2018-02-04 LAB — CBC
HEMATOCRIT: 39 % (ref 39.0–52.0)
Hemoglobin: 12 g/dL — ABNORMAL LOW (ref 13.0–17.0)
MCH: 29.2 pg (ref 26.0–34.0)
MCHC: 30.8 g/dL (ref 30.0–36.0)
MCV: 94.9 fL (ref 80.0–100.0)
NRBC: 0 % (ref 0.0–0.2)
Platelets: 128 10*3/uL — ABNORMAL LOW (ref 150–400)
RBC: 4.11 MIL/uL — ABNORMAL LOW (ref 4.22–5.81)
RDW: 15.1 % (ref 11.5–15.5)
WBC: 4.1 10*3/uL (ref 4.0–10.5)

## 2018-02-05 ENCOUNTER — Ambulatory Visit
Admission: RE | Admit: 2018-02-05 | Discharge: 2018-02-05 | Disposition: A | Discharge status: Home / Self Care | Payer: Medicaid Other | Source: Ambulatory Visit | Attending: Radiation Oncology | Admitting: Radiation Oncology

## 2018-02-05 DIAGNOSIS — Z510 Encounter for antineoplastic radiation therapy: Secondary | ICD-10-CM | POA: Diagnosis not present

## 2018-02-08 ENCOUNTER — Ambulatory Visit
Admission: RE | Admit: 2018-02-08 | Discharge: 2018-02-08 | Disposition: A | Discharge status: Home / Self Care | Payer: Medicaid Other | Source: Ambulatory Visit | Attending: Radiation Oncology | Admitting: Radiation Oncology

## 2018-02-08 DIAGNOSIS — Z510 Encounter for antineoplastic radiation therapy: Secondary | ICD-10-CM | POA: Insufficient documentation

## 2018-02-08 DIAGNOSIS — C61 Malignant neoplasm of prostate: Secondary | ICD-10-CM | POA: Insufficient documentation

## 2018-02-09 ENCOUNTER — Ambulatory Visit
Admission: RE | Admit: 2018-02-09 | Discharge: 2018-02-09 | Disposition: A | Discharge status: Home / Self Care | Payer: Medicaid Other | Source: Ambulatory Visit | Attending: Radiation Oncology | Admitting: Radiation Oncology

## 2018-02-09 DIAGNOSIS — Z510 Encounter for antineoplastic radiation therapy: Secondary | ICD-10-CM | POA: Diagnosis not present

## 2018-02-10 ENCOUNTER — Ambulatory Visit
Admission: RE | Admit: 2018-02-10 | Discharge: 2018-02-10 | Disposition: A | Discharge status: Home / Self Care | Payer: Medicaid Other | Source: Ambulatory Visit | Attending: Radiation Oncology | Admitting: Radiation Oncology

## 2018-02-10 DIAGNOSIS — Z510 Encounter for antineoplastic radiation therapy: Secondary | ICD-10-CM | POA: Diagnosis not present

## 2018-02-11 ENCOUNTER — Ambulatory Visit
Admission: RE | Admit: 2018-02-11 | Discharge: 2018-02-11 | Disposition: A | Discharge status: Home / Self Care | Payer: Medicaid Other | Source: Ambulatory Visit | Attending: Radiation Oncology | Admitting: Radiation Oncology

## 2018-02-11 DIAGNOSIS — Z510 Encounter for antineoplastic radiation therapy: Secondary | ICD-10-CM | POA: Diagnosis not present

## 2018-02-12 ENCOUNTER — Ambulatory Visit
Admission: RE | Admit: 2018-02-12 | Discharge: 2018-02-12 | Disposition: A | Discharge status: Home / Self Care | Payer: Medicaid Other | Source: Ambulatory Visit | Attending: Radiation Oncology | Admitting: Radiation Oncology

## 2018-02-12 DIAGNOSIS — Z510 Encounter for antineoplastic radiation therapy: Secondary | ICD-10-CM | POA: Diagnosis not present

## 2018-02-15 ENCOUNTER — Ambulatory Visit
Admission: RE | Admit: 2018-02-15 | Discharge: 2018-02-15 | Disposition: A | Discharge status: Home / Self Care | Payer: Medicaid Other | Source: Ambulatory Visit | Attending: Radiation Oncology | Admitting: Radiation Oncology

## 2018-02-15 DIAGNOSIS — Z510 Encounter for antineoplastic radiation therapy: Secondary | ICD-10-CM | POA: Diagnosis not present

## 2018-02-16 ENCOUNTER — Ambulatory Visit
Admission: RE | Admit: 2018-02-16 | Discharge: 2018-02-16 | Disposition: A | Discharge status: Home / Self Care | Payer: Medicaid Other | Source: Ambulatory Visit | Attending: Radiation Oncology | Admitting: Radiation Oncology

## 2018-02-16 DIAGNOSIS — Z510 Encounter for antineoplastic radiation therapy: Secondary | ICD-10-CM | POA: Diagnosis not present

## 2018-02-17 ENCOUNTER — Ambulatory Visit
Admission: RE | Admit: 2018-02-17 | Discharge: 2018-02-17 | Disposition: A | Discharge status: Home / Self Care | Payer: Medicaid Other | Source: Ambulatory Visit | Attending: Radiation Oncology | Admitting: Radiation Oncology

## 2018-02-17 DIAGNOSIS — Z510 Encounter for antineoplastic radiation therapy: Secondary | ICD-10-CM | POA: Diagnosis not present

## 2018-02-18 ENCOUNTER — Inpatient Hospital Stay: Payer: Medicaid Other | Attending: Radiation Oncology

## 2018-02-18 ENCOUNTER — Ambulatory Visit
Admission: RE | Admit: 2018-02-18 | Discharge: 2018-02-18 | Disposition: A | Discharge status: Home / Self Care | Payer: Medicaid Other | Source: Ambulatory Visit | Attending: Radiation Oncology | Admitting: Radiation Oncology

## 2018-02-18 DIAGNOSIS — Z510 Encounter for antineoplastic radiation therapy: Secondary | ICD-10-CM | POA: Diagnosis not present

## 2018-02-18 DIAGNOSIS — C61 Malignant neoplasm of prostate: Secondary | ICD-10-CM | POA: Insufficient documentation

## 2018-02-18 LAB — CBC
HEMATOCRIT: 41.5 % (ref 39.0–52.0)
Hemoglobin: 12.8 g/dL — ABNORMAL LOW (ref 13.0–17.0)
MCH: 29.3 pg (ref 26.0–34.0)
MCHC: 30.8 g/dL (ref 30.0–36.0)
MCV: 95 fL (ref 80.0–100.0)
NRBC: 0 % (ref 0.0–0.2)
PLATELETS: 124 10*3/uL — AB (ref 150–400)
RBC: 4.37 MIL/uL (ref 4.22–5.81)
RDW: 15.2 % (ref 11.5–15.5)
WBC: 4.2 10*3/uL (ref 4.0–10.5)

## 2018-02-19 ENCOUNTER — Ambulatory Visit
Admission: RE | Admit: 2018-02-19 | Discharge: 2018-02-19 | Disposition: A | Discharge status: Home / Self Care | Payer: Medicaid Other | Source: Ambulatory Visit | Attending: Radiation Oncology | Admitting: Radiation Oncology

## 2018-02-19 DIAGNOSIS — Z510 Encounter for antineoplastic radiation therapy: Secondary | ICD-10-CM | POA: Diagnosis not present

## 2018-02-22 ENCOUNTER — Ambulatory Visit
Admission: RE | Admit: 2018-02-22 | Discharge: 2018-02-22 | Disposition: A | Discharge status: Home / Self Care | Payer: Medicaid Other | Source: Ambulatory Visit | Attending: Radiation Oncology | Admitting: Radiation Oncology

## 2018-02-22 DIAGNOSIS — Z510 Encounter for antineoplastic radiation therapy: Secondary | ICD-10-CM | POA: Diagnosis not present

## 2018-02-23 ENCOUNTER — Ambulatory Visit
Admission: RE | Admit: 2018-02-23 | Discharge: 2018-02-23 | Disposition: A | Discharge status: Home / Self Care | Payer: Medicaid Other | Source: Ambulatory Visit | Attending: Radiation Oncology | Admitting: Radiation Oncology

## 2018-02-23 DIAGNOSIS — Z510 Encounter for antineoplastic radiation therapy: Secondary | ICD-10-CM | POA: Diagnosis not present

## 2018-02-24 ENCOUNTER — Ambulatory Visit: Payer: Medicaid Other

## 2018-02-25 ENCOUNTER — Ambulatory Visit
Admission: RE | Admit: 2018-02-25 | Discharge: 2018-02-25 | Disposition: A | Discharge status: Home / Self Care | Payer: Medicaid Other | Source: Ambulatory Visit | Attending: Radiation Oncology | Admitting: Radiation Oncology

## 2018-02-25 DIAGNOSIS — Z510 Encounter for antineoplastic radiation therapy: Secondary | ICD-10-CM | POA: Diagnosis not present

## 2018-02-26 ENCOUNTER — Ambulatory Visit
Admission: RE | Admit: 2018-02-26 | Discharge: 2018-02-26 | Disposition: A | Discharge status: Home / Self Care | Payer: Medicaid Other | Source: Ambulatory Visit | Attending: Radiation Oncology | Admitting: Radiation Oncology

## 2018-02-26 DIAGNOSIS — Z510 Encounter for antineoplastic radiation therapy: Secondary | ICD-10-CM | POA: Diagnosis not present

## 2018-03-01 ENCOUNTER — Ambulatory Visit
Admission: RE | Admit: 2018-03-01 | Discharge: 2018-03-01 | Disposition: A | Discharge status: Home / Self Care | Payer: Medicaid Other | Source: Ambulatory Visit | Attending: Radiation Oncology | Admitting: Radiation Oncology

## 2018-03-01 DIAGNOSIS — Z510 Encounter for antineoplastic radiation therapy: Secondary | ICD-10-CM | POA: Diagnosis not present

## 2018-03-02 ENCOUNTER — Ambulatory Visit
Admission: RE | Admit: 2018-03-02 | Discharge: 2018-03-02 | Disposition: A | Discharge status: Home / Self Care | Payer: Medicaid Other | Source: Ambulatory Visit | Attending: Radiation Oncology | Admitting: Radiation Oncology

## 2018-03-02 DIAGNOSIS — Z510 Encounter for antineoplastic radiation therapy: Secondary | ICD-10-CM | POA: Diagnosis not present

## 2018-03-03 ENCOUNTER — Ambulatory Visit
Admission: RE | Admit: 2018-03-03 | Discharge: 2018-03-03 | Disposition: A | Discharge status: Home / Self Care | Payer: Medicaid Other | Source: Ambulatory Visit | Attending: Radiation Oncology | Admitting: Radiation Oncology

## 2018-03-03 DIAGNOSIS — Z510 Encounter for antineoplastic radiation therapy: Secondary | ICD-10-CM | POA: Diagnosis not present

## 2018-03-04 ENCOUNTER — Inpatient Hospital Stay: Payer: Medicaid Other

## 2018-03-04 ENCOUNTER — Ambulatory Visit
Admission: RE | Admit: 2018-03-04 | Discharge: 2018-03-04 | Disposition: A | Discharge status: Home / Self Care | Payer: Medicaid Other | Source: Ambulatory Visit | Attending: Radiation Oncology | Admitting: Radiation Oncology

## 2018-03-04 DIAGNOSIS — Z510 Encounter for antineoplastic radiation therapy: Secondary | ICD-10-CM | POA: Diagnosis not present

## 2018-03-04 DIAGNOSIS — C61 Malignant neoplasm of prostate: Secondary | ICD-10-CM | POA: Diagnosis not present

## 2018-03-04 LAB — CBC
HCT: 37.7 % — ABNORMAL LOW (ref 39.0–52.0)
HEMOGLOBIN: 11.9 g/dL — AB (ref 13.0–17.0)
MCH: 30.1 pg (ref 26.0–34.0)
MCHC: 31.6 g/dL (ref 30.0–36.0)
MCV: 95.2 fL (ref 80.0–100.0)
Platelets: 113 10*3/uL — ABNORMAL LOW (ref 150–400)
RBC: 3.96 MIL/uL — ABNORMAL LOW (ref 4.22–5.81)
RDW: 15.3 % (ref 11.5–15.5)
WBC: 3.1 10*3/uL — AB (ref 4.0–10.5)
nRBC: 0 % (ref 0.0–0.2)

## 2018-03-05 ENCOUNTER — Ambulatory Visit
Admission: RE | Admit: 2018-03-05 | Discharge: 2018-03-05 | Disposition: A | Discharge status: Home / Self Care | Payer: Medicaid Other | Source: Ambulatory Visit | Attending: Radiation Oncology | Admitting: Radiation Oncology

## 2018-03-05 DIAGNOSIS — Z510 Encounter for antineoplastic radiation therapy: Secondary | ICD-10-CM | POA: Diagnosis not present

## 2018-03-08 ENCOUNTER — Ambulatory Visit
Admission: RE | Admit: 2018-03-08 | Discharge: 2018-03-08 | Disposition: A | Discharge status: Home / Self Care | Payer: Medicaid Other | Source: Ambulatory Visit | Attending: Radiation Oncology | Admitting: Radiation Oncology

## 2018-03-08 DIAGNOSIS — C61 Malignant neoplasm of prostate: Secondary | ICD-10-CM | POA: Insufficient documentation

## 2018-03-08 DIAGNOSIS — Z510 Encounter for antineoplastic radiation therapy: Secondary | ICD-10-CM | POA: Diagnosis present

## 2018-03-09 ENCOUNTER — Ambulatory Visit
Admission: RE | Admit: 2018-03-09 | Discharge: 2018-03-09 | Disposition: A | Discharge status: Home / Self Care | Payer: Medicaid Other | Source: Ambulatory Visit | Attending: Radiation Oncology | Admitting: Radiation Oncology

## 2018-03-09 DIAGNOSIS — Z510 Encounter for antineoplastic radiation therapy: Secondary | ICD-10-CM | POA: Diagnosis not present

## 2018-03-10 ENCOUNTER — Ambulatory Visit
Admission: RE | Admit: 2018-03-10 | Discharge: 2018-03-10 | Disposition: A | Discharge status: Home / Self Care | Payer: Medicaid Other | Source: Ambulatory Visit | Attending: Radiation Oncology | Admitting: Radiation Oncology

## 2018-03-10 DIAGNOSIS — Z510 Encounter for antineoplastic radiation therapy: Secondary | ICD-10-CM | POA: Diagnosis not present

## 2018-03-11 ENCOUNTER — Ambulatory Visit
Admission: RE | Admit: 2018-03-11 | Discharge: 2018-03-11 | Disposition: A | Discharge status: Home / Self Care | Payer: Medicaid Other | Source: Ambulatory Visit | Attending: Radiation Oncology | Admitting: Radiation Oncology

## 2018-03-11 DIAGNOSIS — Z510 Encounter for antineoplastic radiation therapy: Secondary | ICD-10-CM | POA: Diagnosis not present

## 2018-03-12 ENCOUNTER — Ambulatory Visit
Admission: RE | Admit: 2018-03-12 | Discharge: 2018-03-12 | Disposition: A | Discharge status: Home / Self Care | Payer: Medicaid Other | Source: Ambulatory Visit | Attending: Radiation Oncology | Admitting: Radiation Oncology

## 2018-03-12 DIAGNOSIS — Z510 Encounter for antineoplastic radiation therapy: Secondary | ICD-10-CM | POA: Diagnosis not present

## 2018-03-15 ENCOUNTER — Ambulatory Visit
Admission: RE | Admit: 2018-03-15 | Discharge: 2018-03-15 | Disposition: A | Discharge status: Home / Self Care | Payer: Medicaid Other | Source: Ambulatory Visit | Attending: Radiation Oncology | Admitting: Radiation Oncology

## 2018-03-15 DIAGNOSIS — Z510 Encounter for antineoplastic radiation therapy: Secondary | ICD-10-CM | POA: Diagnosis not present

## 2018-03-16 ENCOUNTER — Ambulatory Visit
Admission: RE | Admit: 2018-03-16 | Discharge: 2018-03-16 | Disposition: A | Discharge status: Home / Self Care | Payer: Medicaid Other | Source: Ambulatory Visit | Attending: Radiation Oncology | Admitting: Radiation Oncology

## 2018-03-16 DIAGNOSIS — Z510 Encounter for antineoplastic radiation therapy: Secondary | ICD-10-CM | POA: Diagnosis not present

## 2018-03-17 ENCOUNTER — Ambulatory Visit
Admission: RE | Admit: 2018-03-17 | Discharge: 2018-03-17 | Disposition: A | Discharge status: Home / Self Care | Payer: Medicaid Other | Source: Ambulatory Visit | Attending: Radiation Oncology | Admitting: Radiation Oncology

## 2018-03-17 ENCOUNTER — Ambulatory Visit: Payer: Medicaid Other

## 2018-03-17 DIAGNOSIS — Z510 Encounter for antineoplastic radiation therapy: Secondary | ICD-10-CM | POA: Diagnosis not present

## 2018-03-18 ENCOUNTER — Ambulatory Visit
Admission: RE | Admit: 2018-03-18 | Discharge: 2018-03-18 | Disposition: A | Discharge status: Home / Self Care | Payer: Medicaid Other | Source: Ambulatory Visit | Attending: Radiation Oncology | Admitting: Radiation Oncology

## 2018-03-18 ENCOUNTER — Other Ambulatory Visit: Payer: Self-pay

## 2018-03-18 DIAGNOSIS — Z510 Encounter for antineoplastic radiation therapy: Secondary | ICD-10-CM | POA: Diagnosis not present

## 2018-04-13 ENCOUNTER — Other Ambulatory Visit: Payer: Medicaid Other

## 2018-04-16 ENCOUNTER — Ambulatory Visit
Admission: RE | Admit: 2018-04-16 | Discharge: 2018-04-16 | Disposition: A | Discharge status: Home / Self Care | Payer: Medicaid Other | Source: Ambulatory Visit | Attending: Radiation Oncology | Admitting: Radiation Oncology

## 2018-04-16 ENCOUNTER — Other Ambulatory Visit: Payer: Self-pay | Admitting: *Deleted

## 2018-04-16 ENCOUNTER — Other Ambulatory Visit: Payer: Self-pay

## 2018-04-16 ENCOUNTER — Encounter: Payer: Self-pay | Admitting: Radiation Oncology

## 2018-04-16 VITALS — BP 139/94 | HR 75 | Temp 97.3°F | Resp 18 | Wt 237.2 lb

## 2018-04-16 DIAGNOSIS — C61 Malignant neoplasm of prostate: Secondary | ICD-10-CM

## 2018-04-16 DIAGNOSIS — Z923 Personal history of irradiation: Secondary | ICD-10-CM | POA: Diagnosis not present

## 2018-04-16 NOTE — Progress Notes (Signed)
Radiation Oncology Follow up Note  Name: Nathaniel [name]   Date:   04/16/2018 MRN:  [id number] DOB: [birth year]    This 63 y.o. male presents to the clinic today for 1 month follow-up status post external beam radiation therapy for stage IIa adenocarcinoma the prostate presenting with a PSA of 5.5.  REFERRING PROVIDER: Ellamae Sia, MD  HPI: Patient is a 63 year old male now about 1 month having completed external beam radiation therapy to his prostate for stage IIa (T1 cN0 M0) Gleason 7 (3+4) adenocarcinoma the prostate presented with a PSA of 5.5.  He is seen today in routine follow-up and is doing well.  He specifically denies diarrhea dysuria or any other GI/GU complaints.  He is not following any low residue diet not on Imodium..  COMPLICATIONS OF TREATMENT: none  FOLLOW UP COMPLIANCE: keeps appointments   PHYSICAL EXAM:  BP (!) 139/94   Pulse 75   Temp (!) 97.3 F (36.3 C)   Resp 18   Wt 237 lb 3.4 oz (107.6 kg)   BMI 32.17 kg/m  Well-developed well-nourished patient in NAD. HEENT reveals PERLA, EOMI, discs not visualized.  Oral cavity is clear. No oral mucosal lesions are identified. Neck is clear without evidence of cervical or supraclavicular adenopathy. Lungs are clear to A&P. Cardiac examination is essentially unremarkable with regular rate and rhythm without murmur rub or thrill. Abdomen is benign with no organomegaly or masses noted. Motor sensory and DTR levels are equal and symmetric in the upper and lower extremities. Cranial nerves II through XII are grossly intact. Proprioception is intact. No peripheral adenopathy or edema is identified. No motor or sensory levels are noted. Crude visual fields are within normal range.  RADIOLOGY RESULTS: No current films for review  PLAN: Present time patient is doing well very low side effect profile status post external beam radiation.  I am pleased with his overall progress.  I have explained tumor protocol for tracking his  PSA.  Of asked to see him back in 3 to 4 months with a PSA prior to that visit.  Patient knows to call with any concerns.  I would like to take this opportunity to thank you for allowing me to participate in the care of your patient.Noreene Filbert, MD

## 2018-04-20 ENCOUNTER — Ambulatory Visit: Payer: Medicaid Other | Admitting: Urology

## 2018-06-18 ENCOUNTER — Other Ambulatory Visit: Payer: Medicaid Other

## 2018-06-18 ENCOUNTER — Other Ambulatory Visit
Admission: RE | Admit: 2018-06-18 | Discharge: 2018-06-18 | Disposition: A | Discharge status: Home / Self Care | Payer: Medicaid Other | Attending: Radiation Oncology | Admitting: Radiation Oncology

## 2018-06-18 ENCOUNTER — Encounter: Payer: Self-pay | Admitting: Urology

## 2018-06-18 DIAGNOSIS — C61 Malignant neoplasm of prostate: Secondary | ICD-10-CM | POA: Insufficient documentation

## 2018-06-18 LAB — PSA: Prostatic Specific Antigen: 3.06 ng/mL (ref 0.00–4.00)

## 2018-06-21 ENCOUNTER — Encounter: Payer: Self-pay | Admitting: Urology

## 2018-06-21 ENCOUNTER — Ambulatory Visit (INDEPENDENT_AMBULATORY_CARE_PROVIDER_SITE_OTHER): Payer: Medicaid Other | Admitting: Urology

## 2018-06-21 ENCOUNTER — Other Ambulatory Visit: Payer: Self-pay

## 2018-06-21 VITALS — BP 108/71 | HR 81 | Ht 72.0 in | Wt 242.0 lb

## 2018-06-21 DIAGNOSIS — C61 Malignant neoplasm of prostate: Secondary | ICD-10-CM

## 2018-06-21 NOTE — Patient Instructions (Signed)
Prostate Cancer  The prostate is a walnut-sized gland that is involved in the production of semen. It is located below a man's bladder, in front of the rectum. Prostate cancer is the abnormal growth of cells in the prostate gland. What are the causes? The exact cause of this condition is not known. What increases the risk? This condition is more likely to develop in men who:  Are older than age 61.  Are African-American.  Are obese.  Have a family history of prostate cancer.  Have a family history of breast cancer. What are the signs or symptoms? Symptoms of this condition include:  A need to urinate often.  Weak or interrupted flow of urine.  Trouble starting or stopping urination.  Inability to urinate.  Pain or burning during urination.  Painful ejaculation.  Blood in urine or semen.  Persistent pain or discomfort in the lower back, lower abdomen, hips, or upper thighs.  Trouble getting an erection.  Trouble emptying the bladder all the way. How is this diagnosed? This condition can be diagnosed with:  A digital rectal exam. For this exam, a health care provider inserts a gloved finger into the rectum to feel the prostate gland.  A blood test called a prostate-specific antigen (PSA) test.  An imaging test called transrectal ultrasonography.  A procedure in which a sample of tissue is taken from the prostate and examined under a microscope (prostate biopsy). Once the condition is diagnosed, tests will be done to determine how far the cancer has spread. This is called staging the cancer. Staging may involve imaging tests, such as:  A bone scan.  A CT scan.  A PET scan.  An MRI. The stages of prostate cancer are as follows:  Stage I. At this stage, the cancer is found in the prostate only. The cancer is not visible on imaging tests and it is usually found by accident, such as during a prostate surgery.  Stage II. At this stage, the cancer is more advanced  than it is in stage I, but the cancer has not spread outside the prostate.  Stage III. At this stage, the cancer has spread beyond the outer layer of the prostate to nearby tissues. The cancer may be found in the seminal vesicles, which are near the bladder and the prostate.  Stage IV. At this stage, the cancer has spread other parts of the body, such as the lymph nodes, bones, bladder, rectum, liver, or lungs. How is this treated? Treatment for this condition depends on several factors, including the stage of the cancer, your age, personal preferences, and your overall health. Talk with your health care provider about treatment options that are recommended for you. Common treatments include:  Observation for early stage prostate cancer (active surveillance). This involves having exams, blood tests, and in some cases, more biopsies. For some men, this is the only treatment needed.  Surgery. Types of surgeries include: ? Open surgery. In this surgery, a larger incision is made to remove the prostate. ? A laparoscopic prostatectomy. This is a surgery to remove the prostate and lymph nodes through several, small incisions. It is often referred to as a minimally invasive surgery. ? A robotic prostatectomy. This is a surgery to remove the prostate and lymph nodes with the help of a robotic arm that is controlled by a computer. ? Orchiectomy. This is a surgery to remove the testicles. ? Cryosurgery. This is a surgery to freeze and destroy cancer cells.  Radiation treatment. Types   of radiation treatment include: ? External beam radiation. This type aims beams of radiation from outside the body at the prostate to destroy cancerous cells. ? Brachytherapy. This type uses radioactive needles, seeds, wires, or tubes that are implanted into the prostate gland. Like external beam radiation, brachytherapy destroys cancerous cells. An advantage is that this type of radiation limits the damage to surrounding  tissue and has fewer side effects.  High-intensity, focused ultrasonography. This treatment destroys cancer cells by delivering high-energy ultrasound waves to the cancerous cells.  Chemotherapy medicines. This treatment kills cancer cells or stops them from multiplying.  Hormone treatment. This treatment involves taking medicines that act on one of the male hormones (testosterone): ? By stopping your body from producing testosterone. ? By blocking testosterone from reaching cancer cells. Follow these instructions at home:  Take over-the-counter and prescription medicines only as told by your health care provider.  Maintain a healthy diet.  Get plenty of sleep.  Consider joining a support group for men who have prostate cancer. Meeting with a support group may help you learn to cope with the stress of having cancer.  Keep all follow-up visits as told by your health care provider. This is important.  If you have to go to the hospital, notify your cancer specialist (oncologist).  Treatment for prostate cancer may affect sexual function. Continue to have intimate moments with your partner. This may include touching, holding, hugging, and caressing. Contact a health care provider if:  You have trouble urinating.  You have blood in your urine.  You have pain in your hips, back, or chest. Get help right away if:  You have weakness or numbness in your legs.  You cannot control urination or your bowel movements (incontinence).  You have trouble breathing.  You have sudden chest pain.  You have chills or a fever. Summary  The prostate is a walnut-sized gland that is involved in the production of semen. It is located below a man's bladder, in front of the rectum. Prostate cancer is the abnormal growth of cells in the prostate gland.  Treatment for this condition depends on several factors, including the stage of the cancer, your age, personal preferences, and your overall health.  Talk with your health care provider about treatment options that are recommended for you.  Consider joining a support group for men who have prostate cancer. Meeting with a support group may help you learn to cope with the stress of having cancer. This information is not intended to replace advice given to you by your health care provider. Make sure you discuss any questions you have with your health care provider. Document Released: 12/23/2004 Document Revised: 08/27/2016 Document Reviewed: 09/03/2015 Elsevier Interactive Patient Education  2019 [employer]vier Inc[employer]  

## 2018-06-21 NOTE — Progress Notes (Signed)
   06/21/2018 10:29 AM   Nathaniel [name] [birth year] [id number]  Reason for visit: Follow up intermediate risk prostate cancer status post XRT  HPI: I saw Nathaniel [name] in urology clinic today for follow-up of intermediate risk prostate cancer.  He is a 63 year old comorbid male with history of severe COPD that was diagnosed with prostate cancer by Dr. Ivory Broad in February 2019 with Gleason score 3+4 = 7 and perineural invasion present.  PSA at time of diagnosis was 8.9.  He was lost to follow-up, but I saw him in October 2019 and he was amenable to pursuing radiation therapy at that time.  He pleaded external beam radiation with Dr. Baruch Gouty in March 2020.  He reports has been doing very well since that time and denies any significant side effects.  He was having some mild urinary frequency, which is since resolved.  Has not had any PSAs checked since treatment.  He is scheduled to see Dr. Baruch Gouty in July with PSA prior.   ROS: Please see flowsheet from today's date for complete review of systems.  Physical Exam: BP 108/71   Pulse 81   Ht 6' (1.829 m)   Wt 242 lb (109.8 kg)   BMI 32.82 kg/m     Assessment & Plan:   Patient is a 63 year old co-morbid male status post external beam radiation completed 03/2018 for intermediate risk prostate cancer, doing well with no significant side effects at this time.  Keep follow-up in July with Dr. Baruch Gouty for repeat PSA RTC 12/2018 with PSA prior  A total of 10 minutes were spent face-to-face with the patient, greater than 50% was spent in patient education, counseling, and coordination of care regarding prostate cancer, radiation, and follow-up.   Billey Co, [facility] [address] [place], [place] [address] [phone]

## 2018-07-14 ENCOUNTER — Other Ambulatory Visit: Payer: Self-pay

## 2018-07-14 ENCOUNTER — Telehealth: Payer: Self-pay | Admitting: Radiation Oncology

## 2018-07-14 NOTE — Telephone Encounter (Signed)
Spoke with pt to confirm appt date/time, do pre-appt screen which was completed, and adv of Covid-19 guidelines for appt regarding screening questions, temperature check, face mask required, and no visitors allowed °

## 2018-07-15 ENCOUNTER — Inpatient Hospital Stay: Payer: Medicaid Other | Attending: Radiation Oncology

## 2018-07-15 ENCOUNTER — Other Ambulatory Visit: Payer: Self-pay

## 2018-07-15 DIAGNOSIS — C61 Malignant neoplasm of prostate: Secondary | ICD-10-CM

## 2018-07-15 LAB — PSA: Prostatic Specific Antigen: 2.47 ng/mL (ref 0.00–4.00)

## 2018-07-21 ENCOUNTER — Other Ambulatory Visit: Payer: Self-pay

## 2018-07-22 ENCOUNTER — Other Ambulatory Visit: Payer: Self-pay

## 2018-07-22 ENCOUNTER — Ambulatory Visit
Admission: RE | Admit: 2018-07-22 | Discharge: 2018-07-22 | Disposition: A | Discharge status: Home / Self Care | Payer: Medicaid Other | Source: Ambulatory Visit | Attending: Radiation Oncology | Admitting: Radiation Oncology

## 2018-07-22 ENCOUNTER — Encounter: Payer: Self-pay | Admitting: Radiation Oncology

## 2018-07-22 VITALS — BP 144/96 | HR 76 | Temp 97.8°F | Resp 18 | Wt 242.2 lb

## 2018-07-22 DIAGNOSIS — Z923 Personal history of irradiation: Secondary | ICD-10-CM | POA: Diagnosis not present

## 2018-07-22 DIAGNOSIS — C61 Malignant neoplasm of prostate: Secondary | ICD-10-CM | POA: Diagnosis not present

## 2018-07-22 NOTE — Progress Notes (Signed)
Radiation Oncology Follow up Note  Name: Nathaniel [name]   Date:   07/22/2018 MRN:  [id number] DOB: 1955/12/21    This 63 y.o. male presents to the clinic today for 23-month follow-up status post IMRT radiation therapy for stage IIa adenocarcinoma the prostate.  REFERRING PROVIDER: Ellamae Sia, MD  HPI: Patient is a 63 year old male now out 4 months having completed IMRT radiation therapy to his prostate for stage IIa (T1 cN0 M0) Gleason 7 (3+4) adenocarcinoma the prostate presenting with a PSA of 5.5.  Seen today in routine follow-up he is doing well.  Specifically denies any exacerbation of lower urinary tract symptoms or diarrhea.  His PSA has been tracking down in June was 3.0 and this month 2.4..  COMPLICATIONS OF TREATMENT: none  FOLLOW UP COMPLIANCE: keeps appointments   PHYSICAL EXAM:  BP (!) 144/96   Pulse 76   Temp 97.8 F (36.6 C)   Resp 18   Wt 242 lb 2.8 oz (109.8 kg)   BMI 32.84 kg/m  Well-developed well-nourished patient in NAD. HEENT reveals PERLA, EOMI, discs not visualized.  Oral cavity is clear. No oral mucosal lesions are identified. Neck is clear without evidence of cervical or supraclavicular adenopathy. Lungs are clear to A&P. Cardiac examination is essentially unremarkable with regular rate and rhythm without murmur rub or thrill. Abdomen is benign with no organomegaly or masses noted. Motor sensory and DTR levels are equal and symmetric in the upper and lower extremities. Cranial nerves II through XII are grossly intact. Proprioception is intact. No peripheral adenopathy or edema is identified. No motor or sensory levels are noted. Crude visual fields are within normal range.  RADIOLOGY RESULTS: No current films for review  PLAN: Present time his PSA is heading in the right direction although would have like to see more of a decline at this point.  We will see him back in 6 months for follow-up and repeat his PSAs at that time.  Patient continues close  follow-up care with urology.  I would like to take this opportunity to thank you for allowing me to participate in the care of your patient.Noreene Filbert, MD

## 2018-07-22 NOTE — Addendum Note (Signed)
Encounter addended by: Noreene Filbert, MD on: 07/22/2018 12:15 PM  Actions taken: Level of Service modified

## 2018-08-04 ENCOUNTER — Telehealth: Payer: Self-pay | Admitting: Pulmonary Disease

## 2018-08-04 NOTE — Telephone Encounter (Signed)

## 2018-08-05 ENCOUNTER — Ambulatory Visit (INDEPENDENT_AMBULATORY_CARE_PROVIDER_SITE_OTHER): Payer: Medicaid Other | Admitting: Pulmonary Disease

## 2018-08-05 ENCOUNTER — Encounter: Payer: Self-pay | Admitting: Pulmonary Disease

## 2018-08-05 ENCOUNTER — Other Ambulatory Visit: Payer: Self-pay

## 2018-08-05 VITALS — BP 150/82 | HR 97 | Temp 97.5°F | Ht 72.0 in | Wt 238.2 lb

## 2018-08-05 DIAGNOSIS — J449 Chronic obstructive pulmonary disease, unspecified: Secondary | ICD-10-CM | POA: Diagnosis not present

## 2018-08-05 DIAGNOSIS — N185 Chronic kidney disease, stage 5: Secondary | ICD-10-CM

## 2018-08-05 DIAGNOSIS — D472 Monoclonal gammopathy: Secondary | ICD-10-CM

## 2018-08-05 DIAGNOSIS — R0600 Dyspnea, unspecified: Secondary | ICD-10-CM

## 2018-08-05 DIAGNOSIS — I10 Essential (primary) hypertension: Secondary | ICD-10-CM

## 2018-08-05 MED ORDER — TRELEGY ELLIPTA 100-62.5-25 MCG/INH IN AEPB: 1.0000 | INHALATION_SPRAY | Freq: Every day | RESPIRATORY_TRACT | 11 refills | Status: DC

## 2018-08-05 MED ORDER — TRELEGY ELLIPTA 100-62.5-25 MCG/INH IN AEPB: 1.0000 | INHALATION_SPRAY | Freq: Every day | RESPIRATORY_TRACT | 0 refills | Status: DC

## 2018-08-05 NOTE — Progress Notes (Signed)
Subjective:    Patient ID: Nathaniel [name], male    DOB: 07/03/1955, 63 y.o.   MRN: [id number]  HPI Nathaniel [name] is a 63 year old former smoker (quit 2017) who presents for evaluation of COPD.  The patient is kindly referred by Dr. Ellin Goodie.  The patient has a complex medical history he has chronic kidney disease stage V about to embark in dialysis.  He is followed at [facility] nephrology by Dr. Lynnea Ferrier.  He presents with a complaint of increasing shortness of breath.  This issue has been worsening over the last 6 months.  The patient is a difficult historian but when we attempt to pinpoint duration of symptoms he does admit that he has been short of breath for approximately 2 years.  This coincides with worsening renal function.  Previously he could walk without having to stop to catch his breath but now this has become difficult for him.  He used to participate in dance competitions with his wife but cannot do this currently due to breathlessness.  He has been prescribed Trelegy Ellipta however he has not been using it daily for fear of running out of the prescription.  He has difficulties following at [facility] for his pulmonary issues.  He has not had any significant cough or sputum production.  No fever, chills or sweats.  As noted he is being evaluated for dialysis.  He does not endorse orthopnea or paroxysmal nocturnal dyspnea.  He has noted lower extremity edema.  He has not had any chest pain.  No hemoptysis.  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  He is still doing part-time work.  He mostly supervises other workers.  He does not have any military history.  He smoked for 50 years, 1 to 2 packs of cigarettes per day quit in July 2017.  Past medical history, surgical history, family history have been reviewed and are as noted.  Review of Systems  Constitutional: Positive for fatigue.  HENT: Negative.   Eyes:  Negative.   Respiratory: Positive for shortness of breath.   Cardiovascular: Positive for leg swelling.  Gastrointestinal: Negative.   Endocrine: Negative.   Genitourinary: Negative.   Musculoskeletal: Negative.   Skin: Negative.   Allergic/Immunologic: Negative.   Neurological: Negative.   Hematological: Negative.   Psychiatric/Behavioral: Negative.   All other systems reviewed and are negative.      Objective:   Physical Exam Vitals signs (Blood pressure noted to be elevated was taken several times.) and nursing note reviewed.  Constitutional:      Appearance: Normal appearance. He is overweight.  HENT:     Head: Normocephalic and atraumatic.     Right Ear: External ear normal.     Left Ear: External ear normal.     Nose:     Comments: Nose/mouth/throat not examined due to masking requirements for COVID 19. Eyes:     General: No scleral icterus.    Conjunctiva/sclera: Conjunctivae normal.     Pupils: Pupils are equal, round, and reactive to light.  Neck:     Musculoskeletal: Neck supple.     Thyroid: No thyromegaly.     Trachea: Trachea and phonation normal.  Cardiovascular:     Rate and Rhythm: Normal rate and regular rhythm.     Pulses: Normal pulses.     Heart sounds: No murmur.  Pulmonary:     Effort: Pulmonary effort is normal.  No respiratory distress.     Breath sounds: No wheezing or rhonchi.     Comments: Coarse breath sounds, distant. Abdominal:     General: There is no distension.     Palpations: Abdomen is soft.     Tenderness: There is no abdominal tenderness.  Musculoskeletal: Normal range of motion.     Right lower leg: No edema.     Left lower leg: No edema.  Skin:    General: Skin is warm and dry.  Neurological:     General: No focal deficit present.     Mental Status: He is alert. Mental status is at baseline.  Psychiatric:        Mood and Affect: Mood normal.        Behavior: Behavior normal.    Patient underwent ambulatory oximetry and  did not exhibit desaturations with exercise.      Assessment & Plan:   1.  Dyspnea: I believe that this is multifactorial.  I suspect a significant component is the patient's issues with renal insufficiency and volume overload.  This is aggravated by elevated blood pressure which likely exacerbates any potential diastolic dysfunction.  We will need to reassess this pulmonary function to see if he has declined from his most recent study which was performed on 14 January 2017 at [facility].  This study showed an FEV1 of 1.48 L with an FEV1/FVC of 41% which is in the severe range of obstructive physiology.  The patient also exhibited significant air trapping.  Ambulatory oximetry today did not show oxygen desaturations nor tachypnea during the 6-minute walk.  For further evaluation of dyspnea we will also add a 2D echo to evaluate for potential diastolic dysfunction.  2.  COPD, mixed type, poorly compensated: Patient has not been compliant with his Trelegy Ellipta.  I have encouraged him to comply with this medication as it is a  maintenance medication.  The differences between maintenance and as needed medications were explained to the patient.  It appeared that he was confused about these issues.  I also recommended that he use the Trelegy as a maintenance medication daily as it was intended.  3.  Chronic kidney disease stage V: This issue adds complexity to his management.  Suspect that volume overload associated with this is causing a lot of the patient's difficulties.  4.  MGUS: Per oncology.  Supportive care.  5.  Hypertension: Poor control, advised the patient to follow-up with primary care physician to investigate this issue.   Thank you for allowing me to participate in this patient's care    This chart was dictated using voice recognition software/Dragon.  Despite best efforts to proofread, errors can occur which can change the meaning.  Any change was purely unintentional.

## 2018-08-05 NOTE — Patient Instructions (Signed)
1. We will plan for 2D echocardiogram.  2. We will plan for some breathing tests (PFT) as well.   3. Follow-up in 6 weeks.

## 2018-08-12 ENCOUNTER — Other Ambulatory Visit: Payer: Self-pay

## 2018-08-12 ENCOUNTER — Ambulatory Visit
Admission: RE | Admit: 2018-08-12 | Discharge: 2018-08-12 | Disposition: A | Discharge status: Home / Self Care | Payer: Medicaid Other | Source: Ambulatory Visit | Attending: Pulmonary Disease | Admitting: Pulmonary Disease

## 2018-08-12 DIAGNOSIS — J449 Chronic obstructive pulmonary disease, unspecified: Secondary | ICD-10-CM | POA: Insufficient documentation

## 2018-08-12 DIAGNOSIS — I358 Other nonrheumatic aortic valve disorders: Secondary | ICD-10-CM | POA: Diagnosis not present

## 2018-08-12 DIAGNOSIS — Z87891 Personal history of nicotine dependence: Secondary | ICD-10-CM | POA: Insufficient documentation

## 2018-08-12 DIAGNOSIS — I10 Essential (primary) hypertension: Secondary | ICD-10-CM | POA: Diagnosis not present

## 2018-08-12 DIAGNOSIS — E785 Hyperlipidemia, unspecified: Secondary | ICD-10-CM | POA: Insufficient documentation

## 2018-08-12 DIAGNOSIS — R0600 Dyspnea, unspecified: Secondary | ICD-10-CM | POA: Diagnosis not present

## 2018-08-12 NOTE — Progress Notes (Signed)
*  PRELIMINARY RESULTS* Echocardiogram 2D Echocardiogram has been performed.  Nathaniel [name] 08/12/2018, 11:23 AM

## 2018-08-17 ENCOUNTER — Telehealth: Payer: Self-pay | Admitting: Pulmonary Disease

## 2018-08-20 NOTE — Telephone Encounter (Signed)
Result Notes for ECHOCARDIOGRAM COMPLETE  Notes recorded by Maryanna Shape A, CMA on 08/17/2018 at 1:04 PM EDT  Pt is aware of results and voiced his understanding.  Nothing further is needed.  ------   Notes recorded by Shon Hale, CMA on 08/17/2018 at 11:54 AM EDT  Left message to make pt aware of results.  ------   Notes recorded by Tyler Pita, MD on 08/17/2018 at 11:53 AM EDT  Echocardiogram was good. Heart is ok.

## 2018-09-06 ENCOUNTER — Telehealth: Payer: Self-pay | Admitting: Pulmonary Disease

## 2018-09-06 NOTE — Telephone Encounter (Signed)
Per LG verbally- okay for spouse to come with pt to visit.  Pt is aware and voiced his understanding. Nothing further is needed.

## 2018-09-07 ENCOUNTER — Ambulatory Visit: Payer: Medicaid Other | Admitting: Pulmonary Disease

## 2018-09-07 ENCOUNTER — Other Ambulatory Visit: Payer: Self-pay

## 2018-09-07 ENCOUNTER — Encounter: Payer: Self-pay | Admitting: Pulmonary Disease

## 2018-09-07 VITALS — BP 148/78 | HR 77 | Temp 97.5°F | Ht 72.0 in | Wt 239.0 lb

## 2018-09-07 DIAGNOSIS — J449 Chronic obstructive pulmonary disease, unspecified: Secondary | ICD-10-CM | POA: Diagnosis not present

## 2018-09-07 DIAGNOSIS — N185 Chronic kidney disease, stage 5: Secondary | ICD-10-CM | POA: Diagnosis not present

## 2018-09-07 DIAGNOSIS — D472 Monoclonal gammopathy: Secondary | ICD-10-CM

## 2018-09-07 DIAGNOSIS — I10 Essential (primary) hypertension: Secondary | ICD-10-CM

## 2018-09-07 DIAGNOSIS — R0600 Dyspnea, unspecified: Secondary | ICD-10-CM | POA: Diagnosis not present

## 2018-09-07 NOTE — Patient Instructions (Signed)
1.  Arrange follow-up with your kidney specialist with regards to dialysis.  2.  Continue Trelegy 1 puff daily.  3.  Keep your breathing test appointment.  We will see you in follow-up after tests are completed.

## 2018-09-07 NOTE — Progress Notes (Signed)
Subjective:    Patient ID: Nathaniel [name], male    DOB: 06-14-1955, 63 y.o.   MRN: [id number]  HPI Patient is a 63 year old former smoker (quit 2017) who presents for follow-up of dyspnea in the setting of COPD.  He also has issues with end-stage renal disease and is currently contemplating dialysis.  The patient has had issues with a "smoldering" IgG kappa MM/MGUS.  The patient also has had stage IIa prostate cancer treated with IMRT starting January 2020 and completing in March 2020. We first evaluated the patient on 05 August 2018.  At that time he had been trying to consolidate all of his follow-up here in [place].  He had been following with [facility] and was dissatisfied with frequent changes of physicians.  He was on Trelegy Ellipta however was not using it consistently and only as needed to conserve the medication.  We instructed him to start taking the Trelegy regularly.  Since then he has been doing markedly better with regards to chest congestion but continues to feel short of breath with significant exertion.  He also has noted continued increase in lower extremity edema and continued issues with fatigue.  He is scheduled for pulmonary function testing but these have not been done yet.  He presents today with his wife who corroborates that mostly what he has is fatigue.  I suspect that this is due to his renal issues as he has approached need for dialysis.  He had a 2D echo on 6 August that did not show any significant abnormalities.  He has not had any fevers, chills or sweats.  No chest pain nor paroxysmal nocturnal dyspnea.  As noted he does have fatigue at rest and dyspnea with exertion.  Overall however he feels that the Trelegy has helped him with leaving chest congestion and reducing cough.  No other concerns are voiced today.   Review of Systems  Constitutional: Positive for fatigue. Negative for chills, diaphoresis and fever.  HENT: Negative.   Eyes: Negative.   Respiratory:  Positive for shortness of breath. Negative for cough, chest tightness and wheezing.   Cardiovascular: Positive for leg swelling.  Gastrointestinal: Negative.   Endocrine: Negative.   Genitourinary: Negative.   Musculoskeletal: Negative.   Skin: Negative.   Allergic/Immunologic: Negative.   Neurological: Negative.   Hematological: Negative.   Psychiatric/Behavioral: Positive for decreased concentration.  All other systems reviewed and are negative.      Objective:   Physical Exam Vitals signs (Blood pressure noted to be elevated was taken several times.) and nursing note reviewed.  Constitutional:      Appearance: Normal appearance. He is overweight.  HENT:     Head: Normocephalic and atraumatic.     Right Ear: External ear normal.     Left Ear: External ear normal.     Nose:     Comments: Nose/mouth/throat not examined due to masking requirements for COVID 19. Eyes:     General: No scleral icterus.    Conjunctiva/sclera: Conjunctivae normal.     Pupils: Pupils are equal, round, and reactive to light.  Neck:     Musculoskeletal: Neck supple.     Thyroid: No thyromegaly.     Trachea: Trachea and phonation normal.  Cardiovascular:     Rate and Rhythm: Normal rate and regular rhythm.     Pulses: Normal pulses.     Heart sounds: No murmur.  Pulmonary:     Effort: Pulmonary effort is normal. No respiratory distress.  Breath sounds: No wheezing, rhonchi or rales.     Comments: He is moving air well today.  Clear chest. Abdominal:     General: There is no distension.     Palpations: Abdomen is soft.  Musculoskeletal: Normal range of motion.     Right lower leg: No edema.     Left lower leg: No edema.  Skin:    General: Skin is warm and dry.  Neurological:     General: No focal deficit present.     Mental Status: He is alert. Mental status is at baseline.  Psychiatric:        Mood and Affect: Mood normal.        Behavior: Behavior normal.           Assessment  & Plan:  1.  Dyspnea/fatigue:   Type factorial, a significant component is the patient's issues with renal failure and volume overload.  We will need to reassess this pulmonary function to see if he has declined from his most recent study which was performed on 14 January 2017 at [facility].  This study showed an FEV1 of 1.48 L with an FEV1/FVC of 41% which is in the severe range of obstructive physiology.  He is to have repeat PFTs on 29 October. The patient also exhibited significant air trapping.  Ambulatory oximetry performed on 08/05/2018 dear, did not show oxygen desaturations nor tachypnea during the 6-minute walk.    By 2D echo he does not have significant diastolic dysfunction.  Currently the COPD component of his dyspnea appears to be well compensated.  2.  COPD, mixed type, compensated:  He has been compliant with his Trelegy Ellipta since this was recommended during his last visit and he has noted improvement in his chest congestion and cough.  The patient's wife reiterates that he is taking his respiratory medications correctly.  PFTs pending as noted above.  We will see him in follow-up after PFTs are completed.  He knows to call sooner should any new difficulties with regards to his breathing arise.  3.  Chronic kidney disease stage V: This issue adds complexity to his management.  Suspect that volume overload associated with this is causing a lot of the patient's difficulties as well as metabolic derangements associated with renal failure.  4.    MM/MGUS: Per oncology.  Supportive care.  5.  Hypertension:  Better control today.  This issue adds complexity to his management.

## 2018-09-10 ENCOUNTER — Ambulatory Visit: Payer: Medicaid Other | Admitting: Pulmonary Disease

## 2018-10-06 ENCOUNTER — Other Ambulatory Visit (INDEPENDENT_AMBULATORY_CARE_PROVIDER_SITE_OTHER): Payer: Self-pay | Admitting: Vascular Surgery

## 2018-10-06 DIAGNOSIS — N186 End stage renal disease: Secondary | ICD-10-CM

## 2018-10-07 ENCOUNTER — Ambulatory Visit (INDEPENDENT_AMBULATORY_CARE_PROVIDER_SITE_OTHER): Payer: Medicaid Other

## 2018-10-07 ENCOUNTER — Ambulatory Visit (INDEPENDENT_AMBULATORY_CARE_PROVIDER_SITE_OTHER): Payer: Medicaid Other | Admitting: Vascular Surgery

## 2018-10-07 ENCOUNTER — Encounter (INDEPENDENT_AMBULATORY_CARE_PROVIDER_SITE_OTHER): Payer: Self-pay | Admitting: Vascular Surgery

## 2018-10-07 ENCOUNTER — Other Ambulatory Visit: Payer: Self-pay

## 2018-10-07 ENCOUNTER — Telehealth: Payer: Self-pay | Admitting: Pulmonary Disease

## 2018-10-07 VITALS — BP 132/79 | HR 67 | Resp 16 | Ht 72.0 in | Wt 226.0 lb

## 2018-10-07 DIAGNOSIS — N186 End stage renal disease: Secondary | ICD-10-CM

## 2018-10-07 DIAGNOSIS — I10 Essential (primary) hypertension: Secondary | ICD-10-CM

## 2018-10-07 DIAGNOSIS — J449 Chronic obstructive pulmonary disease, unspecified: Secondary | ICD-10-CM

## 2018-10-07 DIAGNOSIS — E782 Mixed hyperlipidemia: Secondary | ICD-10-CM | POA: Diagnosis not present

## 2018-10-07 DIAGNOSIS — N185 Chronic kidney disease, stage 5: Secondary | ICD-10-CM

## 2018-10-07 MED ORDER — ALBUTEROL SULFATE HFA 108 (90 BASE) MCG/ACT IN AERS: 2.0000 | INHALATION_SPRAY | Freq: Four times a day (QID) | RESPIRATORY_TRACT | 2 refills | Status: DC | PRN

## 2018-10-07 NOTE — Telephone Encounter (Signed)
Okay to refill albuterol. °

## 2018-10-07 NOTE — Telephone Encounter (Signed)
Called and spoke to pt, who is requesting refill on albuterol HFA.  Per our records, this medication was not prescribed by Dr. Patsey Berthold previously.  LG please advise on refill. Thanks

## 2018-10-07 NOTE — Progress Notes (Signed)
MRN : [id number]  Nathaniel [name] is a 63 y.o. (1955/08/11) male who presents with chief complaint of No chief complaint on file. Marland Kitchen  History of Present Illness:   The patient is seen for evaluation for dialysis access. The patient has chronic renal insufficiency stage V secondary to hypertension and diabetes. The patient's most recent creatinine clearance is less than 20. The patient volume status has not yet become an issue. Patient's blood pressures been relatively well controlled. There are mild uremic symptoms which appear to be relatively well tolerated at this time. The patient is right-handed.  The patient has been considering the various methods of dialysis and wishes to proceed with hemodialysis and therefore creation of AV access.  The patient denies amaurosis fugax or recent TIA symptoms. There are no recent neurological changes noted. The patient denies claudication symptoms or rest pain symptoms. The patient denies history of DVT, PE or superficial thrombophlebitis. The patient denies recent episodes of angina or shortness of breath.   Vein mapping shows a cephalic vein about 4 mm in diameter bilaterally  No outpatient medications have been marked as taking for the 10/07/18 encounter (Appointment) with Delana Meyer, Dolores Lory, MD.    Past Medical History:  Diagnosis Date  . Arthritis   . Chronic back pain    Dr. Quay Burow manages (per pt)  . COPD (chronic obstructive pulmonary disease) ([place])   . Hearing loss   . Hyperlipidemia   . Hypertension   . MGUS (monoclonal gammopathy of unknown significance)   . Multiple myeloma ([place])   . Proteinuria   . Tobacco abuse     Past Surgical History:  Procedure Laterality Date  . COLONOSCOPY WITH PROPOFOL N/A 07/04/2014   Procedure: COLONOSCOPY WITH PROPOFOL;  Surgeon: Lucilla Lame, MD;  Location: [facility] ENDOSCOPY;  Service: Endoscopy;  Laterality: N/A;  . TONSILLECTOMY     as child    Social History Social History   Tobacco Use   . Smoking status: Former Smoker    Packs/day: 1.00    Years: 50.00    Pack years: 50.00    Types: Cigarettes    Quit date: 07/2015    Years since quitting: 3.2  . Smokeless tobacco: Never Used  . Tobacco comment: smoked 1-2 packs a day for 50 years  Substance Use Topics  . Alcohol use: No    Alcohol/week: 0.0 standard drinks  . Drug use: No    Family History Family History  Problem Relation Age of Onset  . Heart disease Brother 37       MI  . Colon cancer Neg Hx   . Liver disease Neg Hx   . Prostate cancer Neg Hx   . Kidney cancer Neg Hx   . Bladder Cancer Neg Hx   No family history of bleeding/clotting disorders, porphyria or autoimmune disease   Allergies  Allergen Reactions  . Albuterol Other (See Comments)    Muscle cramps     REVIEW OF SYSTEMS (Negative unless checked)  Constitutional: _0 Weight loss  _1 Fever  _2 Chills Cardiac: _3 Chest pain   _4 Chest pressure   _5 Palpitations   _6 Shortness of breath when laying flat   _7 Shortness of breath with exertion. Vascular:  _8 Pain in legs with walking   _9 Pain in legs at rest  _10 History of DVT   _11 Phlebitis   _12 Swelling in legs   _13 Varicose veins   _14 Non-healing ulcers Pulmonary:   _15 Uses home oxygen   _16 Productive cough   _17 Hemoptysis   _18 Wheeze  _19 COPD   _20   Asthma Neurologic:  _0 Dizziness   _1 Seizures   _2 History of stroke   _3 History of TIA  _4 Aphasia   _5 Vissual changes   _6 Weakness or numbness in arm   _7 Weakness or numbness in leg Musculoskeletal:   _8 Joint swelling   _9 Joint pain   _10 Low back pain Hematologic:  _11 Easy bruising  _12 Easy bleeding   _13 Hypercoagulable state   _14 Anemic Gastrointestinal:  _15 Diarrhea   _16 Vomiting  _17 Gastroesophageal reflux/heartburn   _18 Difficulty swallowing. Genitourinary:  _19 Chronic kidney disease   _20 Difficult urination  _21 Frequent urination   _22 Blood in urine Skin:  _23 Rashes   _24 Ulcers  Psychological:  _25 History of anxiety   _26  History of major depression.  Physical Examination   There were no vitals filed for this visit. There is no height or weight on file to calculate BMI. Gen: WD/WN, NAD Head: [place]/AT, No temporalis wasting.  Ear/Nose/Throat: Hearing grossly intact, nares w/o erythema or drainage, poor dentition Eyes: PER, EOMI, sclera nonicteric.  Neck: Supple, no masses.  No bruit or JVD.  Pulmonary:  Good air movement, clear to auscultation bilaterally, no use of accessory muscles.  Cardiac: RRR, normal S1, S2, no Murmurs. Vascular: cephalic vein palpable in the antecubital fossa bilaterally Vessel Right Left  Radial Palpable Palpable  Brachial Palpable Palpable  Gastrointestinal: soft, non-distended. No guarding/no peritoneal signs.  Musculoskeletal: M/S 5/5 throughout.  No deformity or atrophy.  Neurologic: CN 2-12 intact. Pain and light touch intact in extremities.  Symmetrical.  Speech is fluent. Motor exam as listed above. Psychiatric: Judgment intact, Mood & affect appropriate for pt's clinical situation. Dermatologic: No rashes or ulcers noted.  No changes consistent with cellulitis. Lymph : No Cervical lymphadenopathy, no lichenification or skin changes of chronic lymphedema.  CBC Lab Results  Component Value Date   WBC 3.1 (L) 03/04/2018   HGB 11.9 (L) 03/04/2018   HCT 37.7 (L) 03/04/2018   MCV 95.2 03/04/2018   PLT 113 (L) 03/04/2018    BMET No results found for: NA, K, CL, CO2, GLUCOSE, BUN, CREATININE, CALCIUM, GFRNONAA, GFRAA CrCl cannot be calculated (No successful lab value found.).  COAG No results found for: INR, PROTIME  Radiology No results found.   Assessment/Plan 1. Chronic renal insufficiency, stage V (HCC) Recommend:  At this time the patient does not have appropriate extremity access for dialysis  Patient should have a left brachial cephalic fistula created.  The risks, benefits and alternative therapies were reviewed in detail with the patient.  All questions were answered.  The patient agrees to proceed with  surgery.    2. Benign essential HTN Continue antihypertensive medications as already ordered, these medications have been reviewed and there are no changes at this time.   3. Chronic obstructive pulmonary disease, unspecified COPD type ([place]) Continue pulmonary medications and aerosols as already ordered, these medications have been reviewed and there are no changes at this time.    4. Mixed hyperlipidemia Continue statin as ordered and reviewed, no changes at this time     Hortencia Pilar, MD  10/07/2018 8:41 AM

## 2018-10-07 NOTE — Telephone Encounter (Signed)
Rx for albuterol has been sent to preferred pharmacy.  Pt is aware and voiced his understanding.  Nothing further is needed.

## 2018-10-15 ENCOUNTER — Other Ambulatory Visit: Payer: Self-pay

## 2018-10-15 ENCOUNTER — Ambulatory Visit (INDEPENDENT_AMBULATORY_CARE_PROVIDER_SITE_OTHER): Payer: Medicaid Other | Admitting: Cardiology

## 2018-10-15 ENCOUNTER — Encounter: Payer: Self-pay | Admitting: Cardiology

## 2018-10-15 VITALS — BP 110/70 | HR 76 | Ht 72.0 in | Wt 237.0 lb

## 2018-10-15 DIAGNOSIS — R0602 Shortness of breath: Secondary | ICD-10-CM | POA: Diagnosis not present

## 2018-10-15 NOTE — Patient Instructions (Signed)
Medication Instructions:  - no changes  If you need a refill on your cardiac medications before your next appointment, please call your pharmacy.   Lab work: - none ordered  If you have labs (blood work) drawn today and your tests are completely normal, you will receive your results only by: Marland Kitchen MyChart Message (if you have MyChart) OR . A paper copy in the mail If you have any lab test that is abnormal or we need to change your treatment, we will call you to review the results.  Testing/Procedures: -none ordered  Follow-Up: At [facility], you and your health needs are our priority.  As part of our continuing mission to provide you with exceptional heart care, we have created designated Provider Care Teams.  These Care Teams include your primary Cardiologist (physician) and Advanced Practice Providers (APPs -  Physician Assistants and Nurse Practitioners) who all work together to provide you with the care you need, when you need it. Marland Kitchen as needed with Dr. Garen Lah  Any Other Special Instructions Will Be Listed Below (If Applicable). - N/A

## 2018-10-15 NOTE — Progress Notes (Signed)
Cardiology Office Note:    Date:  10/15/2018   ID:  Nathaniel [name], DOB 24-Jun-1955, MRN [id number]  PCP:  Ellamae Sia, MD  Cardiologist:  [name], MD  Electrophysiologist:  None   Referring MD: Ellamae Sia, MD   Chief Complaint  Patient presents with  . New Patient (Initial Visit)    Patient c.o some SOB and swelling in ankles. Meds reviewed verbally with patient.     History of Present Illness:    Nathaniel [name] is a 63 y.o. male with a hx of, smoker x50+ years, CKD stage V, COPD who presents due to worsening shortness of breath.  Patient states symptoms of shortness of breath has worsened over the past year or so.  He notes some swelling in his ankles typically when he has been standing for a long time.  He had a pulmonary test done of late which confirmed he has COPD.  He is also currently in the process of initiating dialysis due to chronic kidney disease.  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  He believes this is taking a toll on his lung.  He states breathing better since he stopped smoking about a year ago.  He denies any history of heart disease, heart attacks.  He originally was placed on a blood pressure pill but his blood pressures were low and the blood pressure medication was stopped.  He had an echocardiogram dated 08/12/2018 which showed normal systolic function, normal diastolic function with EF of 60 to 65%.  Past Medical History:  Diagnosis Date  . Arthritis   . Chronic back pain    Dr. Quay Burow manages (per pt)  . COPD (chronic obstructive pulmonary disease) ([place])   . Hearing loss   . Hyperlipidemia   . MGUS (monoclonal gammopathy of unknown significance)   . Multiple myeloma ([place])   . Proteinuria   . Tobacco abuse     Past Surgical History:  Procedure Laterality Date  . COLONOSCOPY WITH PROPOFOL N/A 07/04/2014   Procedure: COLONOSCOPY WITH PROPOFOL;  Surgeon:  Lucilla Lame, MD;  Location: [facility] ENDOSCOPY;  Service: Endoscopy;  Laterality: N/A;  . TONSILLECTOMY     as child    Current Medications: Current Meds  Medication Sig  . albuterol (VENTOLIN HFA) 108 (90 Base) MCG/ACT inhaler Inhale 2 puffs into the lungs every 6 (six) hours as needed for wheezing or shortness of breath.  . calcium acetate (CALPHRON) 667 MG tablet Take by mouth.  . fluticasone (FLONASE) 50 MCG/ACT nasal spray 1 spray by Each Nare route two (2) times a day as needed. Frequency:BID   Dosage:50   MCG  Instructions:  Note:Dose: 50MCG  . Fluticasone-Umeclidin-Vilant (TRELEGY ELLIPTA) 100-62.5-25 MCG/INH AEPB Inhale 1 puff into the lungs daily.  . furosemide (LASIX) 20 MG tablet Take by mouth.  . oxyCODONE-acetaminophen (PERCOCET/ROXICET) 5-325 MG tablet oxycodone-acetaminophen 5 mg-325 mg tablet  TAKE 1 TABLET FOUR TIMES A DAY  . sildenafil (VIAGRA) 100 MG tablet Take 1 tablet by mouth as needed.     Allergies:   Albuterol   Social History   Socioeconomic History  . Marital status: Single    Spouse name: Not on file  . Number of children: 2  . Years of education: Not on file  . Highest education level: Not on file  Occupational History  . Not on file  Social Needs  . Financial resource strain: Not  on file  . Food insecurity    Worry: Not on file    Inability: Not on file  . Transportation needs    Medical: Not on file    Non-medical: Not on file  Tobacco Use  . Smoking status: Former Smoker    Packs/day: 1.00    Years: 50.00    Pack years: 50.00    Types: Cigarettes    Quit date: 07/2015    Years since quitting: 3.2  . Smokeless tobacco: Never Used  . Tobacco comment: smoked 1-2 packs a day for 50 years  Substance and Sexual Activity  . Alcohol use: No    Alcohol/week: 0.0 standard drinks  . Drug use: No  . Sexual activity: Yes  Lifestyle  . Physical activity    Days per week: Not on file    Minutes per session: Not on file  . Stress: Not on file   Relationships  . Social Herbalist on phone: Not on file    Gets together: Not on file    Attends religious service: Not on file    Active member of club or organization: Not on file    Attends meetings of clubs or organizations: Not on file    Relationship status: Not on file  Other Topics Concern  . Not on file  Social History Narrative   Disabled, 2 sons-healthy     Family History: The patient's family history includes Heart disease (age of onset: 60) in his brother. There is no history of Colon cancer, Liver disease, Prostate cancer, Kidney cancer, or Bladder Cancer.  ROS:   Please see the history of present illness.     All other systems reviewed and are negative.  EKGs/Labs/Other Studies Reviewed:    The following studies were reviewed today:   Echo 08/12/2018 IMPRESSIONS  1. The left ventricle has normal systolic function with an ejection fraction of 60-65%. The cavity size was normal. Left ventricular diastolic parameters were normal. No evidence of left ventricular regional wall motion abnormalities.  2. The right ventricle has normal systolic function. The cavity was normal. There is no increase in right ventricular wall thickness. Right ventricular systolic pressure could not be assessed.  3. The aortic valve is tricuspid. Mild thickening of the aortic valve. Mild calcification of the aortic valve.  4. The aorta is normal in size and structure.  5. The aortic root is normal in size and structure.  6. The inferior vena cava was normal in size with <50% respiratory variability.  EKG:  EKG is  ordered today.  The ekg ordered today demonstrates normal sinus rhythm, normal ECG.  Recent Labs: 03/04/2018: Hemoglobin 11.9; Platelets 113  Recent Lipid Panel No results found for: CHOL, TRIG, HDL, CHOLHDL, VLDL, LDLCALC, LDLDIRECT  Physical Exam:    VS:  BP 110/70 (BP Location: Right Arm, Patient Position: Sitting, Cuff Size: Normal)   Pulse 76   Ht 6' (1.829 m)    Wt 237 lb (107.5 kg)   BMI 32.14 kg/m     Wt Readings from Last 3 Encounters:  10/15/18 237 lb (107.5 kg)  10/07/18 226 lb (102.5 kg)  09/07/18 239 lb (108.4 kg)     GEN:  Well nourished, well developed in no acute distress HEENT: Normal NECK: No JVD; No carotid bruits LYMPHATICS: No lymphadenopathy CARDIAC: Distant heart sounds, RRR, no murmurs, rubs, gallops RESPIRATORY:  Clear to auscultation anteriorly, decreased breath sounds at bases, mild crackles at bases. ABDOMEN: Soft, non-tender, non-distended  MUSCULOSKELETAL:  No edema; No deformity  SKIN: Warm and dry NEUROLOGIC:  Alert and oriented x 3 PSYCHIATRIC:  Normal affect   ASSESSMENT:   Patient's shortness of breath is not likely secondary to cardiac etiology.  His echocardiogram as mentioned above showed normal systolic function, normal diastolic function.  Combination of his pulmonary and renal disease are likely contributing to his symptoms. 1. Shortness of breath    PLAN:    In order of problems listed above:  1. Likely secondary to COPD.  Renal dysfunction can be contributing.  Continue management of COPD and renal dysfunction as per recommendations of both pulmonary and renal services.  Patient congratulated and encouraged to stay tobacco free.  Total encounter time more than 45 minutes  Greater than 50% was spent in counseling and coordination of care with the patient    Medication Adjustments/Labs and Tests Ordered: Current medicines are reviewed at length with the patient today.  Concerns regarding medicines are outlined above.  Orders Placed This Encounter  Procedures  . EKG 12-Lead   No orders of the defined types were placed in this encounter.   Patient Instructions  Medication Instructions:  - no changes  If you need a refill on your cardiac medications before your next appointment, please call your pharmacy.   Lab work: - none ordered  If you have labs (blood work) drawn today and your  tests are completely normal, you will receive your results only by: Marland Kitchen MyChart Message (if you have MyChart) OR . A paper copy in the mail If you have any lab test that is abnormal or we need to change your treatment, we will call you to review the results.  Testing/Procedures: -none ordered  Follow-Up: At [facility], you and your health needs are our priority.  As part of our continuing mission to provide you with exceptional heart care, we have created designated Provider Care Teams.  These Care Teams include your primary Cardiologist (physician) and Advanced Practice Providers (APPs -  Physician Assistants and Nurse Practitioners) who all work together to provide you with the care you need, when you need it. Marland Kitchen as needed with Dr. Garen Lah  Any Other Special Instructions Will Be Listed Below (If Applicable). - N/A      Signed, [name], MD  10/15/2018 11:03 AM    [facility]

## 2018-10-20 ENCOUNTER — Telehealth (INDEPENDENT_AMBULATORY_CARE_PROVIDER_SITE_OTHER): Payer: Self-pay

## 2018-10-20 NOTE — Telephone Encounter (Signed)
Spoke with the patient's wife and he is now scheduled for surgery with Dr. Delana Meyer on 10/29/2018. Patient will do his Covid testing on 10/26/2018 at 1:00 pm at the [facility]. Pre-surgical instructions were discussed and will be mailed to the patient.

## 2018-10-25 ENCOUNTER — Other Ambulatory Visit (INDEPENDENT_AMBULATORY_CARE_PROVIDER_SITE_OTHER): Payer: Self-pay | Admitting: Nurse Practitioner

## 2018-10-26 ENCOUNTER — Other Ambulatory Visit
Admission: RE | Admit: 2018-10-26 | Discharge: 2018-10-26 | Disposition: A | Discharge status: Home / Self Care | Payer: Medicaid Other | Source: Ambulatory Visit | Attending: Vascular Surgery | Admitting: Vascular Surgery

## 2018-10-26 ENCOUNTER — Other Ambulatory Visit: Payer: Self-pay

## 2018-10-26 DIAGNOSIS — Z20828 Contact with and (suspected) exposure to other viral communicable diseases: Secondary | ICD-10-CM | POA: Insufficient documentation

## 2018-10-26 DIAGNOSIS — Z01812 Encounter for preprocedural laboratory examination: Secondary | ICD-10-CM | POA: Diagnosis not present

## 2018-10-26 HISTORY — DX: Chronic kidney disease, unspecified: N18.9

## 2018-10-26 HISTORY — DX: Dyspnea, unspecified: R06.00

## 2018-10-26 HISTORY — DX: Gastro-esophageal reflux disease without esophagitis: K21.9

## 2018-10-26 HISTORY — DX: Anemia, unspecified: D64.9

## 2018-10-26 LAB — BASIC METABOLIC PANEL
Anion gap: 8 (ref 5–15)
BUN: 57 mg/dL — ABNORMAL HIGH (ref 8–23)
CO2: 24 mmol/L (ref 22–32)
Calcium: 9.3 mg/dL (ref 8.9–10.3)
Chloride: 108 mmol/L (ref 98–111)
Creatinine, Ser: 4.33 mg/dL — ABNORMAL HIGH (ref 0.61–1.24)
GFR calc Af Amer: 16 mL/min — ABNORMAL LOW (ref >60)
GFR calc non Af Amer: 14 mL/min — ABNORMAL LOW (ref >60)
Glucose, Bld: 81 mg/dL (ref 70–99)
Potassium: 4.6 mmol/L (ref 3.5–5.1)
Sodium: 140 mmol/L (ref 135–145)

## 2018-10-26 LAB — CBC WITH DIFFERENTIAL/PLATELET
Abs Immature Granulocytes: 0.01 10*3/uL (ref 0.00–0.07)
Basophils Absolute: 0 10*3/uL (ref 0.0–0.1)
Basophils Relative: 0 %
Eosinophils Absolute: 0.2 10*3/uL (ref 0.0–0.5)
Eosinophils Relative: 5 %
HCT: 38.9 % — ABNORMAL LOW (ref 39.0–52.0)
Hemoglobin: 12.4 g/dL — ABNORMAL LOW (ref 13.0–17.0)
Immature Granulocytes: 0 %
Lymphocytes Relative: 33 %
Lymphs Abs: 1.5 10*3/uL (ref 0.7–4.0)
MCH: 30.5 pg (ref 26.0–34.0)
MCHC: 31.9 g/dL (ref 30.0–36.0)
MCV: 95.8 fL (ref 80.0–100.0)
Monocytes Absolute: 0.6 10*3/uL (ref 0.1–1.0)
Monocytes Relative: 13 %
Neutro Abs: 2.3 10*3/uL (ref 1.7–7.7)
Neutrophils Relative %: 49 %
Platelets: 141 10*3/uL — ABNORMAL LOW (ref 150–400)
RBC: 4.06 MIL/uL — ABNORMAL LOW (ref 4.22–5.81)
RDW: 14.8 % (ref 11.5–15.5)
WBC: 4.6 10*3/uL (ref 4.0–10.5)
nRBC: 0 % (ref 0.0–0.2)

## 2018-10-26 LAB — TYPE AND SCREEN
ABO/RH(D): O NEG
Antibody Screen: NEGATIVE

## 2018-10-26 LAB — PROTIME-INR
INR: 1 (ref 0.8–1.2)
Prothrombin Time: 12.9 seconds (ref 11.4–15.2)

## 2018-10-26 LAB — APTT: aPTT: 30 seconds (ref 24–36)

## 2018-10-26 NOTE — Patient Instructions (Signed)
Your procedure is scheduled on: 10-29-18 FRIDAY Report to Same Day Surgery 2nd floor medical mall [facility] Entrance-take elevator on left to 2nd floor.  Check in with surgery information desk.) To find out your arrival time please call [phone] between 1PM - 3PM on 10-28-18 [facility]  Remember: Instructions that are not followed completely may result in serious medical risk, up to and including death, or upon the discretion of your surgeon and anesthesiologist your surgery may need to be rescheduled.    _x___ 1. Do not eat food after midnight the night before your procedure. NO GUM OR CANDY AFTER MIDNIGHT. You may drink clear liquids up to 2 hours before you are scheduled to arrive at the hospital for your procedure.  Do not drink clear liquids within 2 hours of your scheduled arrival to the hospital.  Clear liquids include  --Water or Apple juice without pulp  --Gatorade  --Black Coffee or Clear Tea (No milk, no creamers, do not add anything to the coffee or Tea   ____Ensure clear carbohydrate drink on the way to the hospital for bariatric patients  ____Ensure clear carbohydrate drink 3 hours before surgery.     __x__ 2. No Alcohol for 24 hours before or after surgery.   __x__3. No Smoking or e-cigarettes for 24 prior to surgery.  Do not use any chewable tobacco products for at least 6 hour prior to surgery   ____  4. Bring all medications with you on the day of surgery if instructed.    __x__ 5. Notify your doctor if there is any change in your medical condition     (cold, fever, infections).    x___6. On the morning of surgery brush your teeth with toothpaste and water.  You may rinse your mouth with mouth wash if you wish.  Do not swallow any toothpaste or mouthwash.   Do not wear jewelry, make-up, hairpins, clips or nail polish.  Do not wear lotions, powders, or perfumes.   Do not shave 48 hours prior to surgery. Men may shave face and neck.  Do not bring valuables to  the hospital.    [facility] is not responsible for any belongings or valuables.               Contacts, dentures or bridgework may not be worn into surgery.  Leave your suitcase in the car. After surgery it may be brought to your room.  For patients admitted to the hospital, discharge time is determined by your treatment team.  _  Patients discharged the day of surgery will not be allowed to drive home.  You will need someone to drive you home and stay with you the night of your procedure.    Please read over the following fact sheets that you were given:   [facility] Preparing for Surgery  ____ Take anti-hypertensive listed below, cardiac, seizure, asthma, anti-reflux and psychiatric medicines. These include:  1. NONE  2.  3.  4.  5.  6.  ____Fleets enema or Magnesium Citrate as directed.   _x___ Use CHG Soap or sage wipes as directed on instruction sheet   _X___ Use inhalers on the day of surgery and bring to hospital day of surgery-USE YOUR TRELEGY AND ALBUTEROL INHALER DAY OF SURGERY AND BRING ALBUTEROL [place]  ____ Stop Metformin and Janumet 2 days prior to surgery.    ____ Take 1/2 of usual insulin dose the night before surgery and none on the morning surgery.  ____ Follow recommendations from Cardiologist, Pulmonologist or PCP regarding stopping Aspirin, Coumadin, Plavix ,Eliquis, Effient, or Pradaxa, and Pletal.  X____Stop Anti-inflammatories such as Advil, Aleve, Ibuprofen, Motrin, Naproxen, Naprosyn, Goodies powders or aspirin products NOW-OK to take Tylenol OR PERCOCET IF NEEDED   _x___ Stop supplements until after surgery-STOP VITAMIN E NOW   ____ Bring C-Pap to the hospital.

## 2018-10-26 NOTE — Pre-Procedure Instructions (Signed)
[name], MD  Physician  Cardiology  Progress Notes  Signed  Encounter Date:  10/15/2018          Signed      Expand All Collapse All    Show:Clear all [x] Manual[x] Template[x] Copied  Added by: [x] [name], MD  [] Hover for details  Cardiology Office Note:    Date:  10/15/2018   ID:  Nathaniel [name], DOB 03-15-1955, MRN [id number]  PCP:  Ellamae Sia, MD    Cardiologist:  [name], MD  Electrophysiologist:  None   Referring MD: Ellamae Sia, MD       Chief Complaint  Patient presents with  . New Patient (Initial Visit)    Patient c.o some SOB and swelling in ankles. Meds reviewed verbally with patient.     History of Present Illness:    Nathaniel [name] is a 63 y.o. male with a hx of, smoker x50+ years, CKD stage V, COPD who presents due to worsening shortness of breath.  Patient states symptoms of shortness of breath has worsened over the past year or so.  He notes some swelling in his ankles typically when he has been standing for a long time.  He had a pulmonary test done of late which confirmed he has COPD.  He is also currently in the process of initiating dialysis due to chronic kidney disease.  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  He believes this is taking a toll on his lung.  He states breathing better since he stopped smoking about a year ago.  He denies any history of heart disease, heart attacks.  He originally was placed on a blood pressure pill but his blood pressures were low and the blood pressure medication was stopped.  He had an echocardiogram dated 08/12/2018 which showed normal systolic function, normal diastolic function with EF of 60 to 65%.      Past Medical History:  Diagnosis Date  . Arthritis   . Chronic back pain    Dr. Quay Burow manages (per pt)  . COPD (chronic obstructive pulmonary disease) ([place])   . Hearing loss    . Hyperlipidemia   . MGUS (monoclonal gammopathy of unknown significance)   . Multiple myeloma ([place])   . Proteinuria   . Tobacco abuse          Past Surgical History:  Procedure Laterality Date  . COLONOSCOPY WITH PROPOFOL N/A 07/04/2014   Procedure: COLONOSCOPY WITH PROPOFOL;  Surgeon: Lucilla Lame, MD;  Location: [facility] ENDOSCOPY;  Service: Endoscopy;  Laterality: N/A;  . TONSILLECTOMY     as child    Current Medications: Active Medications      Current Meds  Medication Sig  . albuterol (VENTOLIN HFA) 108 (90 Base) MCG/ACT inhaler Inhale 2 puffs into the lungs every 6 (six) hours as needed for wheezing or shortness of breath.  . calcium acetate (CALPHRON) 667 MG tablet Take by mouth.  . fluticasone (FLONASE) 50 MCG/ACT nasal spray 1 spray by Each Nare route two (2) times a day as needed. Frequency:BID   Dosage:50   MCG  Instructions:  Note:Dose: 50MCG  . Fluticasone-Umeclidin-Vilant (TRELEGY ELLIPTA) 100-62.5-25 MCG/INH AEPB Inhale 1 puff into the lungs daily.  . furosemide (LASIX) 20 MG tablet Take by mouth.  . oxyCODONE-acetaminophen (PERCOCET/ROXICET) 5-325 MG tablet oxycodone-acetaminophen 5 mg-325 mg tablet  TAKE 1 TABLET FOUR TIMES A DAY  . sildenafil (VIAGRA) 100 MG tablet  Take 1 tablet by mouth as needed.       Allergies:   Albuterol   Social History        Socioeconomic History  . Marital status: Single    Spouse name: Not on file  . Number of children: 2  . Years of education: Not on file  . Highest education level: Not on file  Occupational History  . Not on file  Social Needs  . Financial resource strain: Not on file  . Food insecurity    Worry: Not on file    Inability: Not on file  . Transportation needs    Medical: Not on file    Non-medical: Not on file  Tobacco Use  . Smoking status: Former Smoker    Packs/day: 1.00    Years: 50.00    Pack years: 50.00    Types: Cigarettes    Quit date: 07/2015    Years  since quitting: 3.2  . Smokeless tobacco: Never Used  . Tobacco comment: smoked 1-2 packs a day for 50 years  Substance and Sexual Activity  . Alcohol use: No    Alcohol/week: 0.0 standard drinks  . Drug use: No  . Sexual activity: Yes  Lifestyle  . Physical activity    Days per week: Not on file    Minutes per session: Not on file  . Stress: Not on file  Relationships  . Social Herbalist on phone: Not on file    Gets together: Not on file    Attends religious service: Not on file    Active member of club or organization: Not on file    Attends meetings of clubs or organizations: Not on file    Relationship status: Not on file  Other Topics Concern  . Not on file  Social History Narrative   Disabled, 2 sons-healthy     Family History: The patient's family history includes Heart disease (age of onset: 23) in his brother. There is no history of Colon cancer, Liver disease, Prostate cancer, Kidney cancer, or Bladder Cancer.  ROS:   Please see the history of present illness.     All other systems reviewed and are negative.  EKGs/Labs/Other Studies Reviewed:    The following studies were reviewed today:   Echo 08/12/2018 IMPRESSIONS 1. The left ventricle has normal systolic function with an ejection fraction of 60-65%. The cavity size was normal. Left ventricular diastolic parameters were normal. No evidence of left ventricular regional wall motion abnormalities. 2. The right ventricle has normal systolic function. The cavity was normal. There is no increase in right ventricular wall thickness. Right ventricular systolic pressure could not be assessed. 3. The aortic valve is tricuspid. Mild thickening of the aortic valve. Mild calcification of the aortic valve. 4. The aorta is normal in size and structure. 5. The aortic root is normal in size and structure. 6. The inferior vena cava was normal in size with <50% respiratory variability.   EKG:  EKG is  ordered today.  The ekg ordered today demonstrates normal sinus rhythm, normal ECG.  Recent Labs: 03/04/2018: Hemoglobin 11.9; Platelets 113  Recent Lipid Panel Labs (Brief)  No results found for: CHOL, TRIG, HDL, CHOLHDL, VLDL, LDLCALC, LDLDIRECT    Physical Exam:    VS:  BP 110/70 (BP Location: Right Arm, Patient Position: Sitting, Cuff Size: Normal)   Pulse 76   Ht 6' (1.829 m)   Wt 237 lb (107.5 kg)   BMI 32.14 kg/m  Wt Readings from Last 3 Encounters:  10/15/18 237 lb (107.5 kg)  10/07/18 226 lb (102.5 kg)  09/07/18 239 lb (108.4 kg)     GEN:  Well nourished, well developed in no acute distress HEENT: Normal NECK: No JVD; No carotid bruits LYMPHATICS: No lymphadenopathy CARDIAC: Distant heart sounds, RRR, no murmurs, rubs, gallops RESPIRATORY:  Clear to auscultation anteriorly, decreased breath sounds at bases, mild crackles at bases. ABDOMEN: Soft, non-tender, non-distended MUSCULOSKELETAL:  No edema; No deformity  SKIN: Warm and dry NEUROLOGIC:  Alert and oriented x 3 PSYCHIATRIC:  Normal affect   ASSESSMENT:   Patient's shortness of breath is not likely secondary to cardiac etiology.  His echocardiogram as mentioned above showed normal systolic function, normal diastolic function.  Combination of his pulmonary and renal disease are likely contributing to his symptoms. 1. Shortness of breath    PLAN:    In order of problems listed above:  1. Likely secondary to COPD.  Renal dysfunction can be contributing.  Continue management of COPD and renal dysfunction as per recommendations of both pulmonary and renal services.  Patient congratulated and encouraged to stay tobacco free.  Total encounter time more than 45 minutes  Greater than 50% was spent in counseling and coordination of care with the patient    Medication Adjustments/Labs and Tests Ordered: Current medicines are reviewed at length with the patient today.  Concerns  regarding medicines are outlined above.     Orders Placed This Encounter  Procedures  . EKG 12-Lead   No orders of the defined types were placed in this encounter.   Patient Instructions  Medication Instructions:  - no changes  If you need a refill on your cardiac medications before your next appointment, please call your pharmacy.   Lab work: - none ordered  If you have labs (blood work) drawn today and your tests are completely normal, you will receive your results only by:  [place] (if you have MyChart) OR  A paper copy in the mail If you have any lab test that is abnormal or we need to change your treatment, we will call you to review the results.  Testing/Procedures: -none ordered  Follow-Up: At [facility], you and your health needs are our priority. As part of our continuing mission to provide you with exceptional heart care, we have created designated Provider Care Teams. These Care Teams include your primary Cardiologist (physician) and Advanced Practice Providers (APPs -  Physician Assistants and Nurse Practitioners) who all work together to provide you with the care you need, when you need it.  as needed with Dr. Garen Lah  Any Other Special Instructions Will Be Listed Below (If Applicable). - N/A      Signed, [name], MD  10/15/2018 11:03 AM    [place]        Electronically signed by [name], MD at 10/15/2018 11:07 AM   Office Visit on 10/15/2018     Detailed Report

## 2018-10-26 NOTE — Pre-Procedure Instructions (Signed)
Tyler Pita, MD  Physician  Pulmonology  Progress Notes    Signed  Encounter Date:  09/07/2018          Signed      Expand All Collapse All    Show:Clear all [x] Manual[x] Template[x] Copied  Added by: [x] Tyler Pita, MD  [] Hover for details   Subjective:    Subjective   Patient ID: Nathaniel [name], male    DOB: November 15, 1955, 63 y.o.   MRN: [id number]  HPI Patient is a 63 year old former smoker (quit 2017) who presents for follow-up of dyspnea in the setting of COPD.  He also has issues with end-stage renal disease and is currently contemplating dialysis.  The patient has had issues with a "smoldering" IgG kappa MM/MGUS.  The patient also has had stage IIa prostate cancer treated with IMRT starting January 2020 and completing in March 2020. We first evaluated the patient on 05 August 2018.  At that time he had been trying to consolidate all of his follow-up here in [place].  He had been following with [facility] and was dissatisfied with frequent changes of physicians.  He was on Trelegy Ellipta however was not using it consistently and only as needed to conserve the medication.  We instructed him to start taking the Trelegy regularly.  Since then he has been doing markedly better with regards to chest congestion but continues to feel short of breath with significant exertion.  He also has noted continued increase in lower extremity edema and continued issues with fatigue.  He is scheduled for pulmonary function testing but these have not been done yet.  He presents today with his wife who corroborates that mostly what he has is fatigue.  I suspect that this is due to his renal issues as he has approached need for dialysis.  He had a 2D echo on 6 August that did not show any significant abnormalities.  He has not had any fevers, chills or sweats.  No chest pain nor paroxysmal nocturnal dyspnea.  As noted he does have fatigue at rest and dyspnea with exertion.  Overall  however he feels that the Trelegy has helped him with leaving chest congestion and reducing cough.  No other concerns are voiced today.   Review of Systems  Constitutional: Positive for fatigue. Negative for chills, diaphoresis and fever.  HENT: Negative.   Eyes: Negative.   Respiratory: Positive for shortness of breath. Negative for cough, chest tightness and wheezing.   Cardiovascular: Positive for leg swelling.  Gastrointestinal: Negative.   Endocrine: Negative.   Genitourinary: Negative.   Musculoskeletal: Negative.   Skin: Negative.   Allergic/Immunologic: Negative.   Neurological: Negative.   Hematological: Negative.   Psychiatric/Behavioral: Positive for decreased concentration.  All other systems reviewed and are negative.      Objective:   Objective   Physical Exam Vitals signs (Blood pressure noted to be elevated was taken several times.) and nursing note reviewed.  Constitutional:      Appearance: Normal appearance. He is overweight.  HENT:     Head: Normocephalic and atraumatic.     Right Ear: External ear normal.     Left Ear: External ear normal.     Nose:     Comments: Nose/mouth/throat not examined due to masking requirements for COVID 19. Eyes:     General: No scleral icterus.    Conjunctiva/sclera: Conjunctivae normal.     Pupils: Pupils are equal, round, and reactive to light.  Neck:     Musculoskeletal:  Neck supple.     Thyroid: No thyromegaly.     Trachea: Trachea and phonation normal.  Cardiovascular:     Rate and Rhythm: Normal rate and regular rhythm.     Pulses: Normal pulses.     Heart sounds: No murmur.  Pulmonary:     Effort: Pulmonary effort is normal. No respiratory distress.     Breath sounds: No wheezing, rhonchi or rales.     Comments: He is moving air well today.  Clear chest. Abdominal:     General: There is no distension.     Palpations: Abdomen is soft.  Musculoskeletal: Normal range of motion.     Right lower leg: No  edema.     Left lower leg: No edema.  Skin:    General: Skin is warm and dry.  Neurological:     General: No focal deficit present.     Mental Status: He is alert. Mental status is at baseline.  Psychiatric:        Mood and Affect: Mood normal.        Behavior: Behavior normal.           Assessment & Plan:  1. Dyspnea/fatigue:  Type factorial, a significant component is the patient's issues with renal failure and volume overload. We will need to reassess this pulmonary function to see if he has declined from his most recent study which was performed on 14 January 2017 at [facility]. This study showed an FEV1 of 1.48 L with an FEV1/FVC of 41% which is in the severe range of obstructive physiology.  He is to have repeat PFTs on 29 October.The patient also exhibited significant air trapping. Ambulatory oximetry performed on 08/05/2018 dear, did not show oxygen desaturations nor tachypnea during the 6-minute walk.   By 2D echo he does not have significant diastolic dysfunction.  Currently the COPD component of his dyspnea appears to be well compensated.  2. COPD, mixed type, compensated: He has been compliant with his Trelegy Ellipta since this was recommended during his last visit and he has noted improvement in his chest congestion and cough.  The patient's wife reiterates that he is taking his respiratory medications correctly.  PFTs pending as noted above.  We will see him in follow-up after PFTs are completed.  He knows to call sooner should any new difficulties with regards to his breathing arise.  3. Chronic kidney disease stage V:This issue adds complexity to his management. Suspect that volume overload associated with this is causing a lot of the patient's difficulties as well as metabolic derangements associated with renal failure.  4.   MM/MGUS:Per oncology. Supportive care.  5. Hypertension: Better control today.  This issue adds complexity to his management.         Electronically signed by Tyler Pita, MD at 09/08/2018 10:50 AM   Office Visit on 09/07/2018     Detailed Report     Note shared with patient

## 2018-10-27 LAB — SARS CORONAVIRUS 2 (TAT 6-24 HRS): SARS Coronavirus 2: NEGATIVE

## 2018-10-27 NOTE — Pre-Procedure Instructions (Signed)
XR Chest PA Lateral and Apical Lordotic9/08/2018 [facility] Result Impression   No pulmonary edema.  Result Narrative  EXAM: XR CHEST PA LATERAL AND APICAL LORDOTIC DATE: ACCESSION: [id number] UN DICTATED: 09/14/2018 3:49 PM INTERPRETATION LOCATION: [place]  CLINICAL INDICATION: 63 years old Male with assess for edema - N18.4 - CKD (chronic kidney disease) stage 4, GFR 15 - 29 ml/min (CMS - HCC) - J44.9 - Chronic obstructive pulmonary disease, unspecified COPD type (CMS - HCC)   COMPARISON: 05/20/2010.  TECHNIQUE: PA and Lateral Chest Radiographs. Apical lordotic view also performed.  FINDINGS:   Radiographically clear lungs.  No pleural effusion or pneumothorax.  Unremarkable cardiomediastinal silhouette.   Other Result Information  Interface, Rad Results In - 09/14/2018  4:10 PM EDT EXAM: XR CHEST PA LATERAL AND APICAL LORDOTIC DATE: ACCESSION: [id number] UN DICTATED: 09/14/2018 3:49 PM INTERPRETATION LOCATION: [place]  CLINICAL INDICATION: 63 years old Male with assess for edema  - N18.4 - CKD (chronic kidney disease) stage 4, GFR 15 - 29 ml/min (CMS - HCC) - J44.9 - Chronic obstructive pulmonary disease, unspecified COPD type (CMS - HCC)    COMPARISON: 05/20/2010.  TECHNIQUE: PA and Lateral Chest Radiographs. Apical lordotic view also performed.  FINDINGS:   Radiographically clear lungs.  No pleural effusion or pneumothorax.  Unremarkable cardiomediastinal silhouette.   IMPRESSION:  No pulmonary edema.  Status Results Details   Encounter Summary

## 2018-10-28 ENCOUNTER — Encounter: Payer: Self-pay | Admitting: Anesthesiology

## 2018-10-28 MED ORDER — FENTANYL CITRATE (PF) 100 MCG/2ML IJ SOLN
INTRAMUSCULAR | Status: AC
  Filled 2018-10-28: qty 2

## 2018-10-28 MED ORDER — PROPOFOL 10 MG/ML IV BOLUS
INTRAVENOUS | Status: AC
  Filled 2018-10-28: qty 20

## 2018-10-28 MED ORDER — CEFAZOLIN SODIUM-DEXTROSE 1-4 GM/50ML-% IV SOLN
1.0000 g | INTRAVENOUS | Status: AC
  Administered 2018-10-29: 08:00:00 1 g via INTRAVENOUS

## 2018-10-28 MED ORDER — SUGAMMADEX SODIUM 200 MG/2ML IV SOLN
INTRAVENOUS | Status: AC
  Filled 2018-10-28: qty 2

## 2018-10-29 ENCOUNTER — Telehealth (INDEPENDENT_AMBULATORY_CARE_PROVIDER_SITE_OTHER): Payer: Self-pay | Admitting: Nurse Practitioner

## 2018-10-29 ENCOUNTER — Encounter
Admission: RE | Disposition: A | Discharge status: Home / Self Care | Payer: Self-pay | Source: Home / Self Care | Attending: Vascular Surgery

## 2018-10-29 ENCOUNTER — Ambulatory Visit: Payer: Medicaid Other

## 2018-10-29 ENCOUNTER — Encounter: Payer: Self-pay | Admitting: *Deleted

## 2018-10-29 ENCOUNTER — Ambulatory Visit
Admission: RE | Admit: 2018-10-29 | Discharge: 2018-10-29 | Disposition: A | Discharge status: Home / Self Care | Payer: Medicaid Other | Attending: Vascular Surgery | Admitting: Vascular Surgery

## 2018-10-29 DIAGNOSIS — Z87891 Personal history of nicotine dependence: Secondary | ICD-10-CM | POA: Insufficient documentation

## 2018-10-29 DIAGNOSIS — J449 Chronic obstructive pulmonary disease, unspecified: Secondary | ICD-10-CM | POA: Diagnosis not present

## 2018-10-29 DIAGNOSIS — N185 Chronic kidney disease, stage 5: Secondary | ICD-10-CM

## 2018-10-29 DIAGNOSIS — E782 Mixed hyperlipidemia: Secondary | ICD-10-CM | POA: Insufficient documentation

## 2018-10-29 DIAGNOSIS — I2510 Atherosclerotic heart disease of native coronary artery without angina pectoris: Secondary | ICD-10-CM | POA: Diagnosis not present

## 2018-10-29 DIAGNOSIS — I120 Hypertensive chronic kidney disease with stage 5 chronic kidney disease or end stage renal disease: Secondary | ICD-10-CM | POA: Insufficient documentation

## 2018-10-29 DIAGNOSIS — Z888 Allergy status to other drugs, medicaments and biological substances status: Secondary | ICD-10-CM | POA: Insufficient documentation

## 2018-10-29 DIAGNOSIS — M1990 Unspecified osteoarthritis, unspecified site: Secondary | ICD-10-CM | POA: Insufficient documentation

## 2018-10-29 DIAGNOSIS — I10 Essential (primary) hypertension: Secondary | ICD-10-CM | POA: Diagnosis not present

## 2018-10-29 DIAGNOSIS — C9000 Multiple myeloma not having achieved remission: Secondary | ICD-10-CM | POA: Insufficient documentation

## 2018-10-29 DIAGNOSIS — Z6831 Body mass index (BMI) 31.0-31.9, adult: Secondary | ICD-10-CM | POA: Diagnosis not present

## 2018-10-29 DIAGNOSIS — N186 End stage renal disease: Secondary | ICD-10-CM | POA: Insufficient documentation

## 2018-10-29 DIAGNOSIS — E669 Obesity, unspecified: Secondary | ICD-10-CM | POA: Diagnosis not present

## 2018-10-29 DIAGNOSIS — Z8546 Personal history of malignant neoplasm of prostate: Secondary | ICD-10-CM | POA: Diagnosis not present

## 2018-10-29 HISTORY — PX: AV FISTULA PLACEMENT: SHX1204

## 2018-10-29 LAB — ABO/RH: ABO/RH(D): O NEG

## 2018-10-29 SURGERY — ARTERIOVENOUS (AV) FISTULA CREATION
Anesthesia: General | Laterality: Left

## 2018-10-29 MED ORDER — SODIUM CHLORIDE 0.9 % IV SOLN
INTRAVENOUS | Status: DC
  Administered 2018-10-29: 07:00:00 via INTRAVENOUS

## 2018-10-29 MED ORDER — FENTANYL CITRATE (PF) 100 MCG/2ML IJ SOLN
25.0000 ug | INTRAMUSCULAR | Status: DC | PRN
  Administered 2018-10-29 (×3): 25 ug via INTRAVENOUS

## 2018-10-29 MED ORDER — DEXAMETHASONE SODIUM PHOSPHATE 10 MG/ML IJ SOLN
INTRAMUSCULAR | Status: AC
  Filled 2018-10-29: qty 1

## 2018-10-29 MED ORDER — MIDAZOLAM HCL 2 MG/2ML IJ SOLN
INTRAMUSCULAR | Status: AC
  Filled 2018-10-29: qty 2

## 2018-10-29 MED ORDER — FENTANYL CITRATE (PF) 100 MCG/2ML IJ SOLN
INTRAMUSCULAR | Status: DC | PRN
  Administered 2018-10-29: 25 ug via INTRAVENOUS
  Administered 2018-10-29: 50 ug via INTRAVENOUS
  Administered 2018-10-29: 25 ug via INTRAVENOUS

## 2018-10-29 MED ORDER — MIDAZOLAM HCL 2 MG/2ML IJ SOLN
INTRAMUSCULAR | Status: DC | PRN
  Administered 2018-10-29: 2 mg via INTRAVENOUS

## 2018-10-29 MED ORDER — CHLORHEXIDINE GLUCONATE CLOTH 2 % EX PADS
6.0000 | MEDICATED_PAD | Freq: Once | CUTANEOUS | Status: AC
  Administered 2018-10-29: 07:00:00 6 via TOPICAL

## 2018-10-29 MED ORDER — ONDANSETRON HCL 4 MG/2ML IJ SOLN: 4.0000 mg | Freq: Once | INTRAMUSCULAR | Status: DC | PRN

## 2018-10-29 MED ORDER — FAMOTIDINE 20 MG PO TABS
20.0000 mg | ORAL_TABLET | Freq: Once | ORAL | Status: AC
  Administered 2018-10-29: 07:00:00 20 mg via ORAL

## 2018-10-29 MED ORDER — HYDROCODONE-ACETAMINOPHEN 5-325 MG PO TABS: 1.0000 | ORAL_TABLET | Freq: Four times a day (QID) | ORAL | 0 refills | Status: DC | PRN

## 2018-10-29 MED ORDER — PHENYLEPHRINE HCL (PRESSORS) 10 MG/ML IV SOLN
INTRAVENOUS | Status: DC | PRN
  Administered 2018-10-29: 100 ug via INTRAVENOUS

## 2018-10-29 MED ORDER — VASOPRESSIN 20 UNIT/ML IV SOLN
INTRAVENOUS | Status: AC
  Filled 2018-10-29: qty 1

## 2018-10-29 MED ORDER — SODIUM CHLORIDE 0.9 % IV SOLN
INTRAVENOUS | Status: DC | PRN
  Administered 2018-10-29: 08:00:00 40 ug/min via INTRAVENOUS

## 2018-10-29 MED ORDER — PHENYLEPHRINE HCL (PRESSORS) 10 MG/ML IV SOLN
INTRAVENOUS | Status: AC
  Filled 2018-10-29: qty 1

## 2018-10-29 MED ORDER — FENTANYL CITRATE (PF) 100 MCG/2ML IJ SOLN
INTRAMUSCULAR | Status: AC
  Filled 2018-10-29: qty 2

## 2018-10-29 MED ORDER — PROPOFOL 10 MG/ML IV BOLUS
INTRAVENOUS | Status: DC | PRN
  Administered 2018-10-29: 150 mg via INTRAVENOUS

## 2018-10-29 MED ORDER — HEPARIN SODIUM (PORCINE) 5000 UNIT/ML IJ SOLN
INTRAMUSCULAR | Status: AC
  Filled 2018-10-29: qty 1

## 2018-10-29 MED ORDER — PAPAVERINE HCL 30 MG/ML IJ SOLN
INTRAMUSCULAR | Status: AC
  Filled 2018-10-29: qty 2

## 2018-10-29 MED ORDER — GLYCOPYRROLATE 0.2 MG/ML IJ SOLN
INTRAMUSCULAR | Status: AC
  Filled 2018-10-29: qty 1

## 2018-10-29 MED ORDER — LIDOCAINE HCL (CARDIAC) PF 100 MG/5ML IV SOSY
PREFILLED_SYRINGE | INTRAVENOUS | Status: DC | PRN
  Administered 2018-10-29: 100 mg via INTRAVENOUS

## 2018-10-29 MED ORDER — BUPIVACAINE HCL (PF) 0.5 % IJ SOLN
INTRAMUSCULAR | Status: AC
  Filled 2018-10-29: qty 30

## 2018-10-29 MED ORDER — ONDANSETRON HCL 4 MG/2ML IJ SOLN
INTRAMUSCULAR | Status: AC
  Filled 2018-10-29: qty 2

## 2018-10-29 MED ORDER — CHLORHEXIDINE GLUCONATE CLOTH 2 % EX PADS: 6.0000 | MEDICATED_PAD | Freq: Once | CUTANEOUS | Status: DC

## 2018-10-29 MED ORDER — FAMOTIDINE 20 MG PO TABS
ORAL_TABLET | ORAL | Status: AC
  Filled 2018-10-29: qty 1

## 2018-10-29 MED ORDER — VASOPRESSIN 20 UNIT/ML IV SOLN
INTRAVENOUS | Status: DC | PRN
  Administered 2018-10-29 (×3): 1 [IU] via INTRAVENOUS

## 2018-10-29 MED ORDER — LIDOCAINE HCL (PF) 2 % IJ SOLN
INTRAMUSCULAR | Status: AC
  Filled 2018-10-29: qty 10

## 2018-10-29 MED ORDER — HYDROCODONE-ACETAMINOPHEN 5-325 MG PO TABS
1.0000 | ORAL_TABLET | Freq: Once | ORAL | Status: AC
  Administered 2018-10-29: 10:00:00 1 via ORAL

## 2018-10-29 MED ORDER — EPHEDRINE SULFATE 50 MG/ML IJ SOLN
INTRAMUSCULAR | Status: AC
  Filled 2018-10-29: qty 1

## 2018-10-29 MED ORDER — PROPOFOL 10 MG/ML IV BOLUS
INTRAVENOUS | Status: AC
  Filled 2018-10-29: qty 20

## 2018-10-29 MED ORDER — CEFAZOLIN SODIUM-DEXTROSE 1-4 GM/50ML-% IV SOLN
INTRAVENOUS | Status: AC
  Filled 2018-10-29: qty 50

## 2018-10-29 MED ORDER — GLYCOPYRROLATE 0.2 MG/ML IJ SOLN
INTRAMUSCULAR | Status: DC | PRN
  Administered 2018-10-29: 0.2 mg via INTRAVENOUS

## 2018-10-29 MED ORDER — BUPIVACAINE LIPOSOME 1.3 % IJ SUSP
INTRAMUSCULAR | Status: AC
  Filled 2018-10-29: qty 20

## 2018-10-29 MED ORDER — EPHEDRINE SULFATE 50 MG/ML IJ SOLN
INTRAMUSCULAR | Status: DC | PRN
  Administered 2018-10-29: 5 mg via INTRAVENOUS
  Administered 2018-10-29 (×2): 10 mg via INTRAVENOUS

## 2018-10-29 MED ORDER — HYDROCODONE-ACETAMINOPHEN 5-325 MG PO TABS
ORAL_TABLET | ORAL | Status: AC
  Filled 2018-10-29: qty 1

## 2018-10-29 SURGICAL SUPPLY — 58 items
APPLIER CLIP 11 MED OPEN (CLIP)
APPLIER CLIP 9.375 SM OPEN (CLIP)
BAG DECANTER FOR FLEXI CONT (MISCELLANEOUS) ×2 IMPLANT
BLADE SURG SZ11 CARB STEEL (BLADE) ×2 IMPLANT
BOOT SUTURE AID YELLOW STND (SUTURE) ×2 IMPLANT
BRUSH SCRUB EZ  4% CHG (MISCELLANEOUS) ×1
BRUSH SCRUB EZ 4% CHG (MISCELLANEOUS) ×1 IMPLANT
CANISTER SUCT 1200ML W/VALVE (MISCELLANEOUS) ×2 IMPLANT
CHLORAPREP W/TINT 26 (MISCELLANEOUS) ×2 IMPLANT
CLIP APPLIE 11 MED OPEN (CLIP) IMPLANT
CLIP APPLIE 9.375 SM OPEN (CLIP) IMPLANT
COVER WAND RF STERILE (DRAPES) ×2 IMPLANT
DERMABOND ADVANCED (GAUZE/BANDAGES/DRESSINGS) ×2
DERMABOND ADVANCED .7 DNX12 (GAUZE/BANDAGES/DRESSINGS) ×1 IMPLANT
DRESSING SURGICEL FIBRLLR 1X2 (HEMOSTASIS) ×1 IMPLANT
DRSG SURGICEL FIBRILLAR 1X2 (HEMOSTASIS) ×4
ELECT CAUTERY BLADE 6.4 (BLADE) ×2 IMPLANT
ELECT REM PT RETURN 9FT ADLT (ELECTROSURGICAL) ×2
ELECTRODE REM PT RTRN 9FT ADLT (ELECTROSURGICAL) ×1 IMPLANT
GEL ULTRASOUND 20GR AQUASONIC (MISCELLANEOUS) IMPLANT
GLOVE BIO SURGEON STRL SZ7 (GLOVE) ×2 IMPLANT
GLOVE INDICATOR 7.5 STRL GRN (GLOVE) ×2 IMPLANT
GLOVE SURG SYN 8.0 (GLOVE) ×2 IMPLANT
GLOVE SURG SYN 8.0 PF PI (GLOVE) ×1 IMPLANT
GOWN STRL REUS W/ TWL LRG LVL3 (GOWN DISPOSABLE) ×2 IMPLANT
GOWN STRL REUS W/ TWL XL LVL3 (GOWN DISPOSABLE) ×1 IMPLANT
GOWN STRL REUS W/TWL LRG LVL3 (GOWN DISPOSABLE) ×2
GOWN STRL REUS W/TWL XL LVL3 (GOWN DISPOSABLE) ×1
IV NS 500ML (IV SOLUTION) ×1
IV NS 500ML BAXH (IV SOLUTION) ×1 IMPLANT
KIT TURNOVER KIT A (KITS) ×2 IMPLANT
LABEL OR SOLS (LABEL) ×2 IMPLANT
LOOP RED MAXI  1X406MM (MISCELLANEOUS) ×1
LOOP VESSEL MAXI 1X406 RED (MISCELLANEOUS) ×1 IMPLANT
LOOP VESSEL MINI 0.8X406 BLUE (MISCELLANEOUS) ×2 IMPLANT
LOOPS BLUE MINI 0.8X406MM (MISCELLANEOUS) ×2
NDL FILTER BLUNT 18X1 1/2 (NEEDLE) ×1 IMPLANT
NDL HYPO 30X.5 LL (NEEDLE) IMPLANT
NEEDLE FILTER BLUNT 18X 1/2SAF (NEEDLE) ×1
NEEDLE FILTER BLUNT 18X1 1/2 (NEEDLE) ×1 IMPLANT
NEEDLE HYPO 30X.5 LL (NEEDLE) IMPLANT
PACK EXTREMITY ARMC (MISCELLANEOUS) ×2 IMPLANT
PAD PREP 24X41 OB/GYN DISP (PERSONAL CARE ITEMS) ×2 IMPLANT
STOCKINETTE 48X4 2 PLY STRL (GAUZE/BANDAGES/DRESSINGS) ×1 IMPLANT
STOCKINETTE STRL 4IN 9604848 (GAUZE/BANDAGES/DRESSINGS) ×2 IMPLANT
SUT MNCRL+ 5-0 UNDYED PC-3 (SUTURE) ×1 IMPLANT
SUT MONOCRYL 5-0 (SUTURE) ×1
SUT PROLENE 6 0 BV (SUTURE) ×7 IMPLANT
SUT SILK 2 0 (SUTURE) ×1
SUT SILK 2-0 18XBRD TIE 12 (SUTURE) ×1 IMPLANT
SUT SILK 3 0 (SUTURE) ×1
SUT SILK 3-0 18XBRD TIE 12 (SUTURE) ×1 IMPLANT
SUT SILK 4 0 (SUTURE) ×1
SUT SILK 4-0 18XBRD TIE 12 (SUTURE) ×1 IMPLANT
SUT VIC AB 3-0 SH 27 (SUTURE) ×2
SUT VIC AB 3-0 SH 27X BRD (SUTURE) ×1 IMPLANT
SYR 20ML LL LF (SYRINGE) ×2 IMPLANT
SYR 3ML LL SCALE MARK (SYRINGE) ×2 IMPLANT

## 2018-10-29 NOTE — Telephone Encounter (Signed)
Evette Cristal with [facility] calling saying [facility] rx Norco for patient but he is already taking Oxy 5/325 and did we want to fill both. I consulted with Arna Medici bc Schnier is not in office today and she advised to ask pharmacy to hold rx Norco until 10/30/18 but to let him have it then because he is undergoing a surgical procedure by [place] today.  Spoke with Layden and instructed the above. She verbalized understanding. AS, CMA

## 2018-10-29 NOTE — H&P (Signed)
[facility] History & Physical Update  The patient was interviewed and re-examined.  The patient's previous History and Physical has been reviewed and is unchanged.  There is no change in the plan of care. We plan to proceed with the scheduled procedure.  Hortencia Pilar, MD  10/29/2018, 7:27 AM

## 2018-10-29 NOTE — Transfer of Care (Signed)
Immediate Anesthesia Transfer of Care Note  Patient: Nathaniel [name]  Procedure(s) Performed: ARTERIOVENOUS (AV) FISTULA CREATION ( BRACHIAL CEPHALIC) (Left )  Patient Location: PACU  Anesthesia Type:General  Level of Consciousness: awake, alert  and oriented  Airway & Oxygen Therapy: Patient connected to face mask oxygen  Post-op Assessment: Post -op Vital signs reviewed and stable  Post vital signs: stable  Last Vitals:  Vitals Value Taken Time  BP 135/62 10/29/18 0934  Temp 36.2 C 10/29/18 0933  Pulse 90 10/29/18 0934  Resp 19 10/29/18 0934  SpO2 100 % 10/29/18 0934  Vitals shown include unvalidated device data.  Last Pain:  Vitals:   10/29/18 0626  TempSrc: Tympanic  PainSc: 0-No pain         Complications: No apparent anesthesia complications

## 2018-10-29 NOTE — Anesthesia Postprocedure Evaluation (Signed)
Anesthesia Post Note  Patient: Nathaniel [name]  Procedure(s) Performed: ARTERIOVENOUS (AV) FISTULA CREATION ( BRACHIAL CEPHALIC) (Left )  Patient location during evaluation: PACU Anesthesia Type: General Level of consciousness: awake and alert Pain management: pain level controlled Vital Signs Assessment: post-procedure vital signs reviewed and stable Respiratory status: spontaneous breathing, nonlabored ventilation, respiratory function stable and patient connected to nasal cannula oxygen Cardiovascular status: blood pressure returned to baseline and stable Postop Assessment: no apparent nausea or vomiting Anesthetic complications: no     Last Vitals:  Vitals:   10/29/18 1035 10/29/18 1051  BP: 139/81 111/66  Pulse: 80   Resp: 16 16  Temp: 36.6 C   SpO2: 99% 98%    Last Pain:  Vitals:   10/29/18 1035  TempSrc: Temporal  PainSc: 0-No pain                 Taqwa Deem S

## 2018-10-29 NOTE — Op Note (Signed)
OPERATIVE NOTE   PROCEDURE: left brachial cephalic arteriovenous fistula placement  PRE-OPERATIVE DIAGNOSIS: End Stage Renal Disease  POST-OPERATIVE DIAGNOSIS: End Stage Renal Disease  SURGEON: Hortencia Pilar  ASSISTANT(S): Ms. Hezzie Bump  ANESTHESIA: general  ESTIMATED BLOOD LOSS: <50 cc  FINDING(S): 4 mm cephalic vein  SPECIMEN(S):  none  INDICATIONS:   Nathaniel [name] is a 63 y.o. male who presents with end stage renal disease.  The patient is scheduled for left brachiocephalic arteriovenous fistula placement.  The patient is aware the risks include but are not limited to: bleeding, infection, steal syndrome, nerve damage, ischemic monomelic neuropathy, failure to mature, and need for additional procedures.  The patient is aware of the risks of the procedure and elects to proceed forward.  DESCRIPTION: After full informed written consent was obtained from the patient, the patient was brought back to the operating room and placed supine upon the operating table.  Prior to induction, the patient received IV antibiotics.   After obtaining adequate anesthesia, the patient was then prepped and draped in the standard fashion for a left arm access procedure.   A first assistant was required to provide a safe and appropriate environment for executing the surgery.  The assistant was integral in providing retraction, exposure, running suture providing suction and in the closing process.   A curvilinear incision was then created midway between the radial impulse and the cephalic vein. The cephalic vein was then identified and dissected circumferentially. It was marked with a surgical marker.    Attention was then turned to the brachial artery which was exposed through the same incision and looped proximally and distally. Side branches were controlled with 4-0 silk ties.  The distal segment of the vein was ligated with a  2-0 silk, and the vein was transected.  The proximal  segment was interrogated with serial dilators.  The vein accepted up to a 4 mm dilator without any difficulty. Heparinized saline was infused into the vein and clamped it with a small bulldog.  At this point, I reset my exposure of the brachial artery and controlled the artery with vessel loops proximally and distally.  An arteriotomy was then made with a #11 blade, and extended with a Potts scissor.  Heparinized saline was injected proximal and distal into the radial artery.  The vein was then approximated to the artery while the artery was in its native bed and subsequently the vein was beveled using Potts scissors. The vein was then sewn to the artery in an end-to-side configuration with a running stitch of 6-0 Prolene.  Prior to completing this anastomosis Flushing maneuvers were performed and the artery was allowed to forward and back bleed.  There was no evidence of clot from any vessels.  I completed the anastomosis in the usual fashion and then released all vessel loops and clamps.    There was good  thrill in the venous outflow, and there was 1+ palpable radial pulse.  At this point, I irrigated out the surgical wound.  There was no further active bleeding.  The subcutaneous tissue was reapproximated with a running stitch of 3-0 Vicryl.  At this point the patient was noted to have some significant venous bleeding.  The 3-0 Vicryl was removed and the distal end of the cephalic vein was inspected.  The silk tie had come off.  The vein was easily grasped with a hemostat and then ligated with 3-0 Vicryl in a figure-of-eight fashion.  No further bleeding was  noted the wound was reirrigated and then 3-0 Vicryl was used to close the subcutaneous tissues.  The skin was then reapproximated with a running subcuticular stitch of 4-0 Vicryl.  The skin was then cleaned, dried, and reinforced with Dermabond.    The patient tolerated this procedure well.   COMPLICATIONS: None  CONDITION: Nathaniel [name]  Office: [phone]   10/29/2018, 9:25 AM

## 2018-10-29 NOTE — Anesthesia Preprocedure Evaluation (Signed)
Anesthesia Evaluation  Patient identified by MRN, date of birth, ID band Patient awake    Reviewed: Allergy & Precautions, NPO status , Patient's Chart, lab work & pertinent test results, reviewed documented beta blocker date and time   Airway Mallampati: III  TM Distance: >3 FB     Dental  (+) Chipped   Pulmonary shortness of breath, COPD, former smoker,           Cardiovascular hypertension, Pt. on medications + CAD       Neuro/Psych    GI/Hepatic GERD  ,  Endo/Other    Renal/GU ESRFRenal disease     Musculoskeletal  (+) Arthritis ,   Abdominal   Peds  Hematology  (+) anemia ,   Anesthesia Other Findings Obese. Multiple myeloma.  Reproductive/Obstetrics                             Anesthesia Physical Anesthesia Plan  ASA: III  Anesthesia Plan: General   Post-op Pain Management:    Induction: Intravenous  PONV Risk Score and Plan:   Airway Management Planned: LMA  Additional Equipment:   Intra-op Plan:   Post-operative Plan:   Informed Consent: I have reviewed the patients History and Physical, chart, labs and discussed the procedure including the risks, benefits and alternatives for the proposed anesthesia with the patient or authorized representative who has indicated his/her understanding and acceptance.       Plan Discussed with: CRNA  Anesthesia Plan Comments:         Anesthesia Quick Evaluation

## 2018-10-29 NOTE — Anesthesia Post-op Follow-up Note (Signed)
Anesthesia QCDR form completed.        

## 2018-10-29 NOTE — Discharge Instructions (Signed)

## 2018-10-29 NOTE — Anesthesia Procedure Notes (Addendum)
Procedure Name: LMA Insertion Date/Time: 10/29/2018 7:37 AM Performed by: Aline Brochure, CRNA Pre-anesthesia Checklist: Patient identified, Patient being monitored, Timeout performed, Emergency Drugs available and Suction available Patient Re-evaluated:Patient Re-evaluated prior to induction Oxygen Delivery Method: Circle system utilized Preoxygenation: Pre-oxygenation with 100% oxygen Induction Type: IV induction Ventilation: Mask ventilation without difficulty LMA: LMA inserted LMA Size: 4.0 Tube type: Oral Number of attempts: 1 Placement Confirmation: positive ETCO2 and breath sounds checked- equal and bilateral Tube secured with: Tape Dental Injury: Teeth and Oropharynx as per pre-operative assessment

## 2018-10-30 ENCOUNTER — Encounter: Payer: Self-pay | Admitting: Vascular Surgery

## 2018-11-02 ENCOUNTER — Other Ambulatory Visit: Payer: Self-pay

## 2018-11-02 ENCOUNTER — Telehealth: Payer: Self-pay

## 2018-11-02 NOTE — Telephone Encounter (Signed)
Call made to patient, confirmed DOB. Made aware he needs to reports to the [facility] for pre-procedural covid testing before 11am and to remain in his car. Voiced understanding. Order placed. Nothing further needed at this time.

## 2018-11-03 ENCOUNTER — Other Ambulatory Visit: Payer: Self-pay

## 2018-11-03 ENCOUNTER — Other Ambulatory Visit
Admission: RE | Admit: 2018-11-03 | Discharge: 2018-11-03 | Disposition: A | Discharge status: Home / Self Care | Payer: Medicaid Other | Source: Ambulatory Visit | Attending: Pulmonary Disease | Admitting: Pulmonary Disease

## 2018-11-03 DIAGNOSIS — Z20828 Contact with and (suspected) exposure to other viral communicable diseases: Secondary | ICD-10-CM | POA: Diagnosis not present

## 2018-11-03 DIAGNOSIS — Z01812 Encounter for preprocedural laboratory examination: Secondary | ICD-10-CM | POA: Diagnosis present

## 2018-11-03 LAB — SARS CORONAVIRUS 2 (TAT 6-24 HRS): SARS Coronavirus 2: NEGATIVE

## 2018-11-04 ENCOUNTER — Ambulatory Visit: Payer: Medicaid Other | Attending: Pulmonary Disease

## 2018-11-04 DIAGNOSIS — R0600 Dyspnea, unspecified: Secondary | ICD-10-CM | POA: Insufficient documentation

## 2018-11-04 MED ORDER — ALBUTEROL SULFATE (2.5 MG/3ML) 0.083% IN NEBU
2.5000 mg | INHALATION_SOLUTION | Freq: Once | RESPIRATORY_TRACT | Status: AC
  Administered 2018-11-04: 2.5 mg via RESPIRATORY_TRACT
  Filled 2018-11-04: qty 3

## 2018-11-08 ENCOUNTER — Telehealth: Payer: Self-pay

## 2018-11-08 NOTE — Telephone Encounter (Signed)
Error

## 2018-11-08 NOTE — Telephone Encounter (Signed)
Primary Cardiologist: Dr. Earl [facility] Pre-operative Risk Assessment    Request for surgical clearance:  1. What type of surgery is being performed? EGD   2. When is this surgery scheduled? 11/18/18   3. What type of clearance is required (medical clearance vs. Pharmacy clearance to hold med vs. Both)? Medical clearance  4. Are there any medications that need to be held prior to surgery and how long?Not listed   5. Practice name and name of physician performing surgery? [facility] (Dr. Ralph Dowdy)  6. What is your office phone number    7.   What is your office fax number [phone]  8.   Anesthesia type (None, local, MAC, general) ? Not listed    Lamar Laundry 11/08/2018, 11:36 AM  _________________________________________________________________   (provider comments below)

## 2018-11-08 NOTE — Telephone Encounter (Signed)
   Primary Cardiologist: [name], MD  Chart reviewed as part of pre-operative protocol coverage. Patient was contacted 11/08/2018 in reference to pre-operative risk assessment for pending surgery as outlined below.  Nathaniel [name] was last seen on 10/15/2018 by Dr. [name].  Since that day, Nathaniel [name] has done well. He does have DOE, this is unchanged. He occasional has some transient chest discomfort. These episodes does not occur with physical exertional.   Therefore, based on ACC/AHA guidelines, the patient would be at acceptable risk for the planned procedure without further cardiovascular testing.   I will route this recommendation to the requesting party via Epic fax function and remove from pre-op pool.  Please call with questions.  [place], [place] 11/08/2018, 12:21 PM

## 2018-11-16 ENCOUNTER — Other Ambulatory Visit (INDEPENDENT_AMBULATORY_CARE_PROVIDER_SITE_OTHER): Payer: Self-pay | Admitting: Vascular Surgery

## 2018-11-16 DIAGNOSIS — I770 Arteriovenous fistula, acquired: Secondary | ICD-10-CM

## 2018-11-18 ENCOUNTER — Encounter (INDEPENDENT_AMBULATORY_CARE_PROVIDER_SITE_OTHER): Payer: Self-pay | Admitting: Nurse Practitioner

## 2018-11-18 ENCOUNTER — Ambulatory Visit (INDEPENDENT_AMBULATORY_CARE_PROVIDER_SITE_OTHER): Payer: Medicaid Other

## 2018-11-18 ENCOUNTER — Other Ambulatory Visit: Payer: Self-pay

## 2018-11-18 ENCOUNTER — Ambulatory Visit (INDEPENDENT_AMBULATORY_CARE_PROVIDER_SITE_OTHER): Payer: Medicaid Other | Admitting: Nurse Practitioner

## 2018-11-18 VITALS — BP 137/68 | HR 96 | Resp 16 | Wt 238.4 lb

## 2018-11-18 DIAGNOSIS — N185 Chronic kidney disease, stage 5: Secondary | ICD-10-CM | POA: Diagnosis not present

## 2018-11-18 DIAGNOSIS — J449 Chronic obstructive pulmonary disease, unspecified: Secondary | ICD-10-CM | POA: Diagnosis not present

## 2018-11-18 DIAGNOSIS — I770 Arteriovenous fistula, acquired: Secondary | ICD-10-CM | POA: Diagnosis not present

## 2018-11-18 DIAGNOSIS — I10 Essential (primary) hypertension: Secondary | ICD-10-CM

## 2018-11-21 ENCOUNTER — Encounter (INDEPENDENT_AMBULATORY_CARE_PROVIDER_SITE_OTHER): Payer: Self-pay | Admitting: Nurse Practitioner

## 2018-11-21 NOTE — Progress Notes (Signed)
SUBJECTIVE:  Patient ID: Nathaniel [name], male    DOB: [birth year], 63 y.o.   MRN: [id number] Chief Complaint  Patient presents with  . Follow-up    [facility] 3week post fistula    HPI  Nathaniel [name] is a 63 y.o. male that recently underwent left brachiocephalic AV fistula placement.  The patient is still currently a chronic kidney stage V patient not yet requiring dialysis.  However per the patient it may be soon.  The patient reports having some numbness along his forearm however no numbness or pain in his fingertips.  He denies any fever, chills, nausea, vomiting or diarrhea.  Today the patient underwent noninvasive studies he currently has a flow volume of 2178.  There is a mild increase in velocities at the arterial anastomosis site.  No significant areas of stenosis seen.  Past Medical History:  Diagnosis Date  . Anemia   . Arthritis   . Chronic back pain    Dr. Quay Burow manages (per pt)  . Chronic kidney disease   . COPD (chronic obstructive pulmonary disease) ([place])   . Dyspnea    with exertion  . GERD (gastroesophageal reflux disease)    h/o  . Hearing loss   . Hyperlipidemia   . MGUS (monoclonal gammopathy of unknown significance)   . Multiple myeloma ([place]) 2011   and prostate ca in 2019  . Proteinuria   . Tobacco abuse     Past Surgical History:  Procedure Laterality Date  . AV FISTULA PLACEMENT Left 10/29/2018   Procedure: ARTERIOVENOUS (AV) FISTULA CREATION ( BRACHIAL CEPHALIC);  Surgeon: Katha Cabal, MD;  Location: [facility] ORS;  Service: Vascular;  Laterality: Left;  . COLONOSCOPY WITH PROPOFOL N/A 07/04/2014   Procedure: COLONOSCOPY WITH PROPOFOL;  Surgeon: Lucilla Lame, MD;  Location: [facility] ENDOSCOPY;  Service: Endoscopy;  Laterality: N/A;  . TONSILLECTOMY     as child    Social History   Socioeconomic History  . Marital status: Single    Spouse name: Not on file  . Number of children: 2  . Years of education: Not on file  . Highest education level: Not  on file  Occupational History  . Not on file  Social Needs  . Financial resource strain: Not on file  . Food insecurity    Worry: Not on file    Inability: Not on file  . Transportation needs    Medical: Not on file    Non-medical: Not on file  Tobacco Use  . Smoking status: Former Smoker    Packs/day: 1.00    Years: 50.00    Pack years: 50.00    Types: Cigarettes    Quit date: 07/2015    Years since quitting: 3.3  . Smokeless tobacco: Never Used  . Tobacco comment: smoked 1-2 packs a day for 50 years  Substance and Sexual Activity  . Alcohol use: No    Alcohol/week: 0.0 standard drinks  . Drug use: No  . Sexual activity: Yes  Lifestyle  . Physical activity    Days per week: Not on file    Minutes per session: Not on file  . Stress: Not on file  Relationships  . Social Herbalist on phone: Not on file    Gets together: Not on file    Attends religious service: Not on file    Active member of club or organization: Not on file    Attends meetings of clubs or organizations: Not on file  Relationship status: Not on file  . Intimate partner violence    Fear of current or ex partner: Not on file    Emotionally abused: Not on file    Physically abused: Not on file    Forced sexual activity: Not on file  Other Topics Concern  . Not on file  Social History Narrative   Disabled, 2 sons-healthy    Family History  Problem Relation Age of Onset  . Heart disease Brother 22       MI  . Colon cancer Neg Hx   . Liver disease Neg Hx   . Prostate cancer Neg Hx   . Kidney cancer Neg Hx   . Bladder Cancer Neg Hx     Allergies  Allergen Reactions  . Albuterol Other (See Comments)    Muscle cramps     Review of Systems   Review of Systems: Negative Unless Checked Constitutional: _0 Weight loss  _1 Fever  _2 Chills Cardiac: _3 Chest pain   _4  Atrial Fibrillation  _5 Palpitations   _6 Shortness of breath when laying flat   _7 Shortness of breath with exertion.  _8 Shortness of breath at rest Vascular:  _9 Pain in legs with walking   _10 Pain in legs with standing _11 Pain in legs when laying flat   _12 Claudication    _13 Pain in feet when laying flat    _14 History of DVT   _15 Phlebitis   _16 Swelling in legs   _17 Varicose veins   _18 Non-healing ulcers Pulmonary:   _19 Uses home oxygen   _20 Productive cough   _21 Hemoptysis   _22 Wheeze  _23 COPD   _24 Asthma Neurologic:  _25 Dizziness   _26 Seizures  _27 Blackouts _28 History of stroke   _29 History of TIA  _30 Aphasia   _31 Temporary Blindness   _32 Weakness or numbness in arm   _33 Weakness or numbness in leg Musculoskeletal:   _34 Joint swelling   _35 Joint pain   _36 Low back pain  _37  History of Knee Replacement _38 Arthritis _39 back Surgeries  _40  Spinal Stenosis    Hematologic:  _41 Easy bruising  _42 Easy bleeding   _43 Hypercoagulable state   _44 Anemic Gastrointestinal:  _45 Diarrhea   _46 Vomiting  _47 Gastroesophageal reflux/heartburn   _48 Difficulty swallowing. _49 Abdominal pain Genitourinary:  _50 Chronic kidney disease   _51 Difficult urination  _52 Anuric   _53 Blood in urine _54 Frequent urination  _55 Burning with urination   _56 Hematuria Skin:  _57 Rashes   _58 Ulcers _59 Wounds Psychological:  _60 History of anxiety   _61  History of major depression  _62  Memory Difficulties      OBJECTIVE:   Physical Exam  BP 137/68 (BP Location: Right Arm)   Pulse 96   Resp 16   Wt 238 lb 6.4 oz (108.1 kg)   BMI 32.33 kg/m   Gen: WD/WN, NAD, appears disheveled Head: [place]/AT, No temporalis wasting.  Ear/Nose/Throat: Hearing grossly intact, nares w/o erythema or drainage Eyes: PER, EOMI, sclera nonicteric.  Neck: Supple, no masses.  No JVD.  Pulmonary:  Good air movement, no use of accessory muscles.  Cardiac: RRR Vascular:  Good thrill and bruit Vessel Right Left  Radial Palpable Palpable   Gastrointestinal: soft, non-distended. No guarding/no peritoneal signs.  Musculoskeletal: M/S 5/5 throughout.  No deformity or atrophy.  Neurologic: Pain and light touch intact in  extremities.  Symmetrical.  Speech is fluent. Motor exam as listed above. Psychiatric: Judgment intact, Mood & affect appropriate for pt's clinical situation. Dermatologic: No Venous rashes. No Ulcers Noted.  No changes consistent with cellulitis. Lymph : No Cervical lymphadenopathy, no lichenification or skin changes of chronic lymphedema.       ASSESSMENT AND PLAN:  1. Chronic renal insufficiency, stage V (HCC) Recommend:  Currently the patient has not yet begun dialysis.  However in 3 to 4 weeks the patient should be able to use access within the next 3 to 4 weeks.  The patient should have a duplex ultrasound of the dialysis access in 6 months. The patient will follow-up with me in the office after each ultrasound     2. Benign essential HTN Continue antihypertensive medications as already ordered, these medications have been reviewed and there are no changes at this time.   3. Chronic obstructive pulmonary disease, unspecified COPD type ([place]) Continue pulmonary medications and aerosols as already ordered, these medications have been reviewed and there are no changes at this time.     Current Outpatient Medications on File Prior to Visit  Medication Sig Dispense Refill  . albuterol (VENTOLIN HFA) 108 (90 Base) MCG/ACT inhaler Inhale 2 puffs into the lungs every 6 (six) hours as needed for wheezing or shortness of breath. 18 g 2  . azithromycin (ZITHROMAX) 250 MG tablet Take 250 mg by mouth at bedtime.    . calcium acetate (CALPHRON) 667 MG tablet Take 667 mg by mouth 2 (two) times daily with a meal.     . cholecalciferol (VITAMIN D3) 25 MCG (1000 UT) tablet Take 1,000 Units by mouth daily.    . fluticasone (FLONASE) 50 MCG/ACT nasal spray Place 2 sprays into both nostrils daily as needed for allergies.     . Fluticasone-Umeclidin-Vilant (TRELEGY ELLIPTA) 100-62.5-25 MCG/INH AEPB Inhale 1 puff into the lungs daily. (Patient taking differently: Inhale 1 puff into the lungs every  morning. ) 1 each 11  . furosemide (LASIX) 20 MG tablet Take 20 mg by mouth at bedtime. And then prn    . HYDROcodone-acetaminophen (NORCO) 5-325 MG tablet Take 1-2 tablets by mouth every 6 (six) hours as needed for severe pain. 35 tablet 0  . Multiple Vitamin (MULTIVITAMIN WITH MINERALS) TABS tablet Take 1 tablet by mouth daily.    Marland Kitchen oxyCODONE-acetaminophen (PERCOCET/ROXICET) 5-325 MG tablet Take 1 tablet by mouth every 6 (six) hours as needed for moderate pain.     . pravastatin (PRAVACHOL) 20 MG tablet Take 20 mg by mouth at bedtime.    . vitamin E 400 UNIT capsule Take 400 Units by mouth daily.     No current facility-administered medications on file prior to visit.     There are no Patient Instructions on file for this visit. No follow-ups on file.   Kris Hartmann, NP  This note was completed with Sales executive.  Any errors are purely unintentional.

## 2018-12-13 ENCOUNTER — Other Ambulatory Visit: Payer: Self-pay | Admitting: *Deleted

## 2018-12-13 DIAGNOSIS — C61 Malignant neoplasm of prostate: Secondary | ICD-10-CM

## 2018-12-14 ENCOUNTER — Telehealth: Payer: Self-pay | Admitting: *Deleted

## 2018-12-14 ENCOUNTER — Other Ambulatory Visit: Payer: Medicaid Other

## 2018-12-14 ENCOUNTER — Encounter: Payer: Self-pay | Admitting: Urology

## 2018-12-14 NOTE — Telephone Encounter (Signed)
Left Vm to return call-needs to have PSA drawn prior to appointment 12/15/2018

## 2018-12-15 ENCOUNTER — Ambulatory Visit: Payer: Medicaid Other | Admitting: Urology

## 2018-12-22 ENCOUNTER — Ambulatory Visit: Payer: Medicaid Other | Admitting: Urology

## 2018-12-24 ENCOUNTER — Telehealth (INDEPENDENT_AMBULATORY_CARE_PROVIDER_SITE_OTHER): Payer: Self-pay | Admitting: Vascular Surgery

## 2018-12-24 NOTE — Telephone Encounter (Signed)
PLEASE ADVISE PATIENT

## 2018-12-24 NOTE — Telephone Encounter (Signed)
Patient wife called informing that they will not be returning back to [facility] dialysis due to sticking the patient to many times and they left the tourniquet on the arm for to long causing swelling and possibly the fistula collapsing. The patient will would like to transfer to another dialysis and was seeing if our office can refer the patient. I spoke with Eulogio Ditch NP and she advise for the patient to be schedule with HDA and see provider,also she stated the patient should contact his nephrologist about switching dialysis center. Patient has been aware with medical advice and verbalized understanding.

## 2018-12-27 ENCOUNTER — Other Ambulatory Visit (INDEPENDENT_AMBULATORY_CARE_PROVIDER_SITE_OTHER): Payer: Self-pay | Admitting: Nurse Practitioner

## 2018-12-27 ENCOUNTER — Ambulatory Visit (INDEPENDENT_AMBULATORY_CARE_PROVIDER_SITE_OTHER): Payer: Medicaid Other | Admitting: Nurse Practitioner

## 2018-12-27 ENCOUNTER — Other Ambulatory Visit: Payer: Self-pay

## 2018-12-27 ENCOUNTER — Ambulatory Visit (INDEPENDENT_AMBULATORY_CARE_PROVIDER_SITE_OTHER): Payer: Medicaid Other

## 2018-12-27 ENCOUNTER — Encounter (INDEPENDENT_AMBULATORY_CARE_PROVIDER_SITE_OTHER): Payer: Self-pay | Admitting: Nurse Practitioner

## 2018-12-27 VITALS — BP 143/74 | HR 88 | Resp 16 | Wt 234.0 lb

## 2018-12-27 DIAGNOSIS — M79602 Pain in left arm: Secondary | ICD-10-CM | POA: Diagnosis not present

## 2018-12-27 DIAGNOSIS — M7989 Other specified soft tissue disorders: Secondary | ICD-10-CM

## 2018-12-27 DIAGNOSIS — N185 Chronic kidney disease, stage 5: Secondary | ICD-10-CM | POA: Diagnosis not present

## 2018-12-27 DIAGNOSIS — I10 Essential (primary) hypertension: Secondary | ICD-10-CM | POA: Diagnosis not present

## 2018-12-27 DIAGNOSIS — N186 End stage renal disease: Secondary | ICD-10-CM

## 2018-12-28 ENCOUNTER — Encounter (INDEPENDENT_AMBULATORY_CARE_PROVIDER_SITE_OTHER): Payer: Self-pay

## 2018-12-28 ENCOUNTER — Telehealth (INDEPENDENT_AMBULATORY_CARE_PROVIDER_SITE_OTHER): Payer: Self-pay

## 2018-12-28 ENCOUNTER — Other Ambulatory Visit (INDEPENDENT_AMBULATORY_CARE_PROVIDER_SITE_OTHER): Payer: Self-pay | Admitting: Nurse Practitioner

## 2018-12-28 NOTE — Telephone Encounter (Signed)
I attempted to contact the patient's wife and a message was left for a return call.

## 2018-12-28 NOTE — Telephone Encounter (Signed)
Spoke with the patient's wife and he is now scheduled with Dr. Delana Meyer for [facility] on 01/18/19 with a 10:15 am arrival time to the [facility]. Patient will do covid testing on 01/14/19 between 12:30-2:30 pm at the [facility]. Pre-procedure instructions were discussed and will be mailed to the patient.

## 2019-01-02 ENCOUNTER — Encounter (INDEPENDENT_AMBULATORY_CARE_PROVIDER_SITE_OTHER): Payer: Self-pay | Admitting: Nurse Practitioner

## 2019-01-02 NOTE — Progress Notes (Signed)
SUBJECTIVE:  Patient ID: Nathaniel [name], male    DOB: April 03, 1955, 63 y.o.   MRN: [id number] Chief Complaint  Patient presents with  . Follow-up    fistula complications    HPI  Nathaniel [name] is a 63 y.o. male that presents today after an incident at his dialysis center.  The patient has stage V chronic kidney disease and recently the decision was made to start on dialysis.  The patient with his first dialysis session and per the patient it was a traumatic event.  The patient and wife state that the tourniquet was left on for an extended period of time, and he had several sticks into his fistula that were not successful.  Subsequently his arm had excessive swelling.  Today the patient's arm is very noticeably swollen, however according to the patient's wife it was 2-3 times worse right after the incident happened.  The patient and wife are understandably distraught over the incident.  The patient does report having fatigue and loss of appetite recently.  The patient also does note that he does not take his phosphorus binders on a regular basis due to the gastric discomfort they cause.  The patient denies any overt shortness of breath.  He denies any fever or chills.  However, symptoms of the patient does describe signs that he is becoming increasingly uremic.  He has a flow volume of 1776.  His left brachiocephalic AV fistula appears to be patent throughout however there are extremely elevated velocities at the AV fistula anastomosis that extend into the beginning segment of the fistula. Past Medical History:  Diagnosis Date  . Anemia   . Arthritis   . Chronic back pain    Dr. Quay Burow manages (per pt)  . Chronic kidney disease   . COPD (chronic obstructive pulmonary disease) ([place])   . Dyspnea    with exertion  . GERD (gastroesophageal reflux disease)    h/o  . Hearing loss   . Hyperlipidemia   . MGUS (monoclonal gammopathy of unknown significance)   . Multiple myeloma ([place]) 2011   and  prostate ca in 2019  . Proteinuria   . Tobacco abuse     Past Surgical History:  Procedure Laterality Date  . AV FISTULA PLACEMENT Left 10/29/2018   Procedure: ARTERIOVENOUS (AV) FISTULA CREATION ( BRACHIAL CEPHALIC);  Surgeon: Katha Cabal, MD;  Location: [facility] ORS;  Service: Vascular;  Laterality: Left;  . COLONOSCOPY WITH PROPOFOL N/A 07/04/2014   Procedure: COLONOSCOPY WITH PROPOFOL;  Surgeon: Lucilla Lame, MD;  Location: [facility] ENDOSCOPY;  Service: Endoscopy;  Laterality: N/A;  . TONSILLECTOMY     as child    Social History   Socioeconomic History  . Marital status: Single    Spouse name: Not on file  . Number of children: 2  . Years of education: Not on file  . Highest education level: Not on file  Occupational History  . Not on file  Tobacco Use  . Smoking status: Former Smoker    Packs/day: 1.00    Years: 50.00    Pack years: 50.00    Types: Cigarettes    Quit date: 07/2015    Years since quitting: 3.4  . Smokeless tobacco: Never Used  . Tobacco comment: smoked 1-2 packs a day for 50 years  Substance and Sexual Activity  . Alcohol use: No    Alcohol/week: 0.0 standard drinks  . Drug use: No  . Sexual activity: Yes  Other Topics Concern  .  Not on file  Social History Narrative   Disabled, 2 sons-healthy   Social Determinants of Health   Financial Resource Strain:   . Difficulty of Paying Living Expenses: Not on file  Food Insecurity:   . Worried About Charity fundraiser in the Last Year: Not on file  . Ran Out of Food in the Last Year: Not on file  Transportation Needs:   . Lack of Transportation (Medical): Not on file  . Lack of Transportation (Non-Medical): Not on file  Physical Activity:   . Days of Exercise per Week: Not on file  . Minutes of Exercise per Session: Not on file  Stress:   . Feeling of Stress : Not on file  Social Connections:   . Frequency of Communication with Friends and Family: Not on file  . Frequency of Social Gatherings  with Friends and Family: Not on file  . Attends Religious Services: Not on file  . Active Member of Clubs or Organizations: Not on file  . Attends Archivist Meetings: Not on file  . Marital Status: Not on file  Intimate Partner Violence:   . Fear of Current or Ex-Partner: Not on file  . Emotionally Abused: Not on file  . Physically Abused: Not on file  . Sexually Abused: Not on file    Family History  Problem Relation Age of Onset  . Heart disease Brother 43       MI  . Colon cancer Neg Hx   . Liver disease Neg Hx   . Prostate cancer Neg Hx   . Kidney cancer Neg Hx   . Bladder Cancer Neg Hx     Allergies  Allergen Reactions  . Albuterol Other (See Comments)    Muscle cramps     Review of Systems   Review of Systems: Negative Unless Checked Constitutional: _0 Weight loss  _1 Fever  _2 Chills Cardiac: _3 Chest pain   _4  Atrial Fibrillation  _5 Palpitations   _6 Shortness of breath when laying flat   _7 Shortness of breath with exertion. _8 Shortness of breath at rest Vascular:  _9 Pain in legs with walking   _10 Pain in legs with standing _11 Pain in legs when laying flat   _12 Claudication    _13 Pain in feet when laying flat    _14 History of DVT   _15 Phlebitis   _16 Swelling in legs   _17 Varicose veins   _18 Non-healing ulcers Pulmonary:   _19 Uses home oxygen   _20 Productive cough   _21 Hemoptysis   _22 Wheeze  _23 COPD   _24 Asthma Neurologic:  _25 Dizziness   _26 Seizures  _27 Blackouts _28 History of stroke   _29 History of TIA  _30 Aphasia   _31 Temporary Blindness   _32 Weakness or numbness in arm   _33 Weakness or numbness in leg Musculoskeletal:   _34 Joint swelling   _35 Joint pain   _36 Low back pain  _37  History of Knee Replacement _38 Arthritis _39 back Surgeries  _40  Spinal Stenosis    Hematologic:  _41 Easy bruising  _42 Easy bleeding   _43 Hypercoagulable state   _44 Anemic Gastrointestinal:  _45 Diarrhea   _46 Vomiting  _47 Gastroesophageal reflux/heartburn   _48 Difficulty swallowing. _49 Abdominal pain Genitourinary:   _50 Chronic kidney disease   _51 Difficult urination  _52 Anuric   _53 Blood in urine _54 Frequent urination  _55 Burning with urination   _56 Hematuria Skin:  _57 Rashes   _58 Ulcers _59 Wounds Psychological:  _60 History of anxiety   _61  History of major depression  _62  Memory Difficulties      OBJECTIVE:   Physical Exam  BP (!) 143/74 (BP Location: Right Arm)   Pulse 88   Resp 16  Wt 234 lb (106.1 kg)   BMI 31.74 kg/m   Gen: WD/WN, NAD, fatigue Head: [place]/AT, No temporalis wasting.  Ear/Nose/Throat: Hearing grossly intact, nares w/o erythema or drainage Eyes: PER, EOMI, sclera nonicteric.  Neck: Supple, no masses.  No JVD.  Pulmonary:  Good air movement, no use of accessory muscles.  Cardiac: RRR Vascular:  Good thrill and bruit however markedly swollen left upper extremity.  Fistula not very prominent due to swelling Vessel Right Left  Radial Palpable Palpable  Gastrointestinal: soft, non-distended. No guarding/no peritoneal signs.  Musculoskeletal: M/S 5/5 throughout.  No deformity or atrophy.  Neurologic: Pain and light touch intact in extremities.  Symmetrical.  Speech is fluent. Motor exam as listed above. Psychiatric: Judgment intact, Mood & affect appropriate for pt's clinical situation. Dermatologic: No Venous rashes. No Ulcers Noted.  No changes consistent with cellulitis. Lymph : No Cervical lymphadenopathy, no lichenification or skin changes of chronic lymphedema.       ASSESSMENT AND PLAN:  1. Chronic renal insufficiency, stage V ([place]) I had a long discussion with the patient and wife regarding his dialysis center and his beginning uremic symptoms.  Currently, the only true relief for this would be dialysis.  Understandably the patient and wife are apprehensive about continuing to utilize the same dialysis center, however they would need to discuss changing dialysis centers with their nephrologist.  The patient currently does have an AV fistula however or significantly elevated  velocities at the anastomosis which extends into the beginning segment of the fistula.  Based on this and the recent issues that the patient had at dialysis it is best to proceed with fistulogram of the left upper extremity AV fistula.   Patient should have a fistulagram with the intention for intervention.  The intention for intervention is to restore appropriate flow and prevent thrombosis and possible loss of the access.  As well as improve the quality of dialysis therapy.  The risks, benefits and alternative therapies were reviewed in detail with the patient.  All questions were answered.  The patient agrees to proceed with angio/intervention.      2. Benign essential HTN Continue antihypertensive medications as already ordered, these medications have been reviewed and there are no changes at this time.    Current Outpatient Medications on File Prior to Visit  Medication Sig Dispense Refill  . albuterol (VENTOLIN HFA) 108 (90 Base) MCG/ACT inhaler Inhale 2 puffs into the lungs every 6 (six) hours as needed for wheezing or shortness of breath. 18 g 2  . azithromycin (ZITHROMAX) 250 MG tablet Take 250 mg by mouth at bedtime.    . calcium acetate (CALPHRON) 667 MG tablet Take 667 mg by mouth 2 (two) times daily with a meal.     . cholecalciferol (VITAMIN D3) 25 MCG (1000 UT) tablet Take 1,000 Units by mouth daily.    . fluticasone (FLONASE) 50 MCG/ACT nasal spray Place 2 sprays into both nostrils daily as needed for allergies.     . Fluticasone-Umeclidin-Vilant (TRELEGY ELLIPTA) 100-62.5-25 MCG/INH AEPB Inhale 1 puff into the lungs daily. (Patient taking differently: Inhale 1 puff into the lungs every morning. ) 1 each 11  . furosemide (LASIX) 20 MG tablet Take 20 mg by mouth at bedtime. And then prn    . Multiple Vitamin (MULTIVITAMIN WITH MINERALS) TABS tablet Take 1 tablet by mouth daily.    Marland Kitchen oxyCODONE-acetaminophen (PERCOCET/ROXICET) 5-325 MG tablet Take 1 tablet by mouth every 6 (six)  hours as needed for moderate pain.     Marland Kitchen  pravastatin (PRAVACHOL) 20 MG tablet Take 20 mg by mouth at bedtime.    . vitamin E 400 UNIT capsule Take 400 Units by mouth daily.    Marland Kitchen HYDROcodone-acetaminophen (NORCO) 5-325 MG tablet Take 1-2 tablets by mouth every 6 (six) hours as needed for severe pain. (Patient not taking: Reported on 12/27/2018) 35 tablet 0   No current facility-administered medications on file prior to visit.    There are no Patient Instructions on file for this visit. No follow-ups on file.   Kris Hartmann, NP  This note was completed with Sales executive.  Any errors are purely unintentional.

## 2019-01-14 ENCOUNTER — Other Ambulatory Visit
Admission: RE | Admit: 2019-01-14 | Discharge: 2019-01-14 | Disposition: A | Discharge status: Home / Self Care | Payer: Medicaid Other | Source: Ambulatory Visit | Attending: Vascular Surgery | Admitting: Vascular Surgery

## 2019-01-14 DIAGNOSIS — Z01812 Encounter for preprocedural laboratory examination: Secondary | ICD-10-CM | POA: Insufficient documentation

## 2019-01-14 DIAGNOSIS — Z20822 Contact with and (suspected) exposure to covid-19: Secondary | ICD-10-CM | POA: Insufficient documentation

## 2019-01-15 LAB — SARS CORONAVIRUS 2 (TAT 6-24 HRS): SARS Coronavirus 2: NEGATIVE

## 2019-01-17 ENCOUNTER — Other Ambulatory Visit (INDEPENDENT_AMBULATORY_CARE_PROVIDER_SITE_OTHER): Payer: Self-pay | Admitting: Nurse Practitioner

## 2019-01-17 MED ORDER — CEFAZOLIN SODIUM-DEXTROSE 1-4 GM/50ML-% IV SOLN: 1.0000 g | Freq: Once | INTRAVENOUS | Status: AC

## 2019-01-18 ENCOUNTER — Encounter: Payer: Self-pay | Admitting: Vascular Surgery

## 2019-01-18 ENCOUNTER — Ambulatory Visit
Admission: RE | Admit: 2019-01-18 | Discharge: 2019-01-18 | Disposition: A | Discharge status: Home / Self Care | Payer: Medicaid Other | Attending: Vascular Surgery | Admitting: Vascular Surgery

## 2019-01-18 ENCOUNTER — Encounter
Admission: RE | Disposition: A | Discharge status: Home / Self Care | Payer: Self-pay | Source: Home / Self Care | Attending: Vascular Surgery

## 2019-01-18 DIAGNOSIS — T82898A Other specified complication of vascular prosthetic devices, implants and grafts, initial encounter: Secondary | ICD-10-CM

## 2019-01-18 DIAGNOSIS — M1990 Unspecified osteoarthritis, unspecified site: Secondary | ICD-10-CM | POA: Diagnosis not present

## 2019-01-18 DIAGNOSIS — Z87891 Personal history of nicotine dependence: Secondary | ICD-10-CM | POA: Insufficient documentation

## 2019-01-18 DIAGNOSIS — Z992 Dependence on renal dialysis: Secondary | ICD-10-CM | POA: Insufficient documentation

## 2019-01-18 DIAGNOSIS — T82858A Stenosis of vascular prosthetic devices, implants and grafts, initial encounter: Secondary | ICD-10-CM | POA: Insufficient documentation

## 2019-01-18 DIAGNOSIS — K219 Gastro-esophageal reflux disease without esophagitis: Secondary | ICD-10-CM | POA: Diagnosis not present

## 2019-01-18 DIAGNOSIS — Z79899 Other long term (current) drug therapy: Secondary | ICD-10-CM | POA: Diagnosis not present

## 2019-01-18 DIAGNOSIS — Y841 Kidney dialysis as the cause of abnormal reaction of the patient, or of later complication, without mention of misadventure at the time of the procedure: Secondary | ICD-10-CM | POA: Diagnosis not present

## 2019-01-18 DIAGNOSIS — E785 Hyperlipidemia, unspecified: Secondary | ICD-10-CM | POA: Insufficient documentation

## 2019-01-18 DIAGNOSIS — N186 End stage renal disease: Secondary | ICD-10-CM

## 2019-01-18 DIAGNOSIS — J449 Chronic obstructive pulmonary disease, unspecified: Secondary | ICD-10-CM | POA: Insufficient documentation

## 2019-01-18 DIAGNOSIS — I120 Hypertensive chronic kidney disease with stage 5 chronic kidney disease or end stage renal disease: Secondary | ICD-10-CM | POA: Diagnosis not present

## 2019-01-18 HISTORY — PX: A/V FISTULAGRAM: CATH118298

## 2019-01-18 LAB — POTASSIUM (ARMC VASCULAR LAB ONLY): Potassium (ARMC vascular lab): 4.6 (ref 3.5–5.1)

## 2019-01-18 SURGERY — A/V FISTULAGRAM
Anesthesia: Moderate Sedation | Site: Arm Upper | Laterality: Left

## 2019-01-18 MED ORDER — FENTANYL CITRATE (PF) 100 MCG/2ML IJ SOLN
INTRAMUSCULAR | Status: AC
  Filled 2019-01-18: qty 2

## 2019-01-18 MED ORDER — MIDAZOLAM HCL 2 MG/2ML IJ SOLN
INTRAMUSCULAR | Status: DC | PRN
  Administered 2019-01-18: 2 mg via INTRAVENOUS
  Administered 2019-01-18 (×2): 1 mg via INTRAVENOUS

## 2019-01-18 MED ORDER — HEPARIN SODIUM (PORCINE) 1000 UNIT/ML IJ SOLN
INTRAMUSCULAR | Status: DC | PRN
  Administered 2019-01-18: 3000 [IU] via INTRAVENOUS

## 2019-01-18 MED ORDER — HYDROMORPHONE HCL 1 MG/ML IJ SOLN: 1.0000 mg | Freq: Once | INTRAMUSCULAR | Status: DC | PRN

## 2019-01-18 MED ORDER — FAMOTIDINE 20 MG PO TABS: 40.0000 mg | ORAL_TABLET | Freq: Once | ORAL | Status: DC | PRN

## 2019-01-18 MED ORDER — MIDAZOLAM HCL 2 MG/ML PO SYRP: 8.0000 mg | ORAL_SOLUTION | Freq: Once | ORAL | Status: DC | PRN

## 2019-01-18 MED ORDER — CEFAZOLIN SODIUM-DEXTROSE 1-4 GM/50ML-% IV SOLN
INTRAVENOUS | Status: AC
  Administered 2019-01-18: 1 g via INTRAVENOUS
  Filled 2019-01-18: qty 50

## 2019-01-18 MED ORDER — METHYLPREDNISOLONE SODIUM SUCC 125 MG IJ SOLR: 125.0000 mg | Freq: Once | INTRAMUSCULAR | Status: DC | PRN

## 2019-01-18 MED ORDER — DIPHENHYDRAMINE HCL 50 MG/ML IJ SOLN
INTRAMUSCULAR | Status: DC | PRN
  Administered 2019-01-18: 25 mg via INTRAVENOUS

## 2019-01-18 MED ORDER — MIDAZOLAM HCL 5 MG/5ML IJ SOLN
INTRAMUSCULAR | Status: AC
  Filled 2019-01-18: qty 5

## 2019-01-18 MED ORDER — DIPHENHYDRAMINE HCL 50 MG/ML IJ SOLN: 50.0000 mg | Freq: Once | INTRAMUSCULAR | Status: DC | PRN

## 2019-01-18 MED ORDER — HEPARIN SODIUM (PORCINE) 1000 UNIT/ML IJ SOLN
INTRAMUSCULAR | Status: AC
  Filled 2019-01-18: qty 1

## 2019-01-18 MED ORDER — DIPHENHYDRAMINE HCL 50 MG/ML IJ SOLN
INTRAMUSCULAR | Status: AC
  Filled 2019-01-18: qty 1

## 2019-01-18 MED ORDER — ONDANSETRON HCL 4 MG/2ML IJ SOLN: 4.0000 mg | Freq: Four times a day (QID) | INTRAMUSCULAR | Status: DC | PRN

## 2019-01-18 MED ORDER — SODIUM CHLORIDE 0.9 % IV SOLN: INTRAVENOUS | Status: DC

## 2019-01-18 MED ORDER — FENTANYL CITRATE (PF) 100 MCG/2ML IJ SOLN
INTRAMUSCULAR | Status: DC | PRN
  Administered 2019-01-18 (×3): 50 ug via INTRAVENOUS

## 2019-01-18 SURGICAL SUPPLY — 19 items
BALLN LUTONIX DCB 4X40X130 (BALLOONS) ×2
BALLN LUTONIX DCB 5X40X130 (BALLOONS) ×2
BALLOON LUTONIX DCB 4X40X130 (BALLOONS) ×1 IMPLANT
BALLOON LUTONIX DCB 5X40X130 (BALLOONS) ×1 IMPLANT
CANNULA 5F STIFF (CANNULA) ×2 IMPLANT
CATH BEACON 5 .035 40 KMP TP (CATHETERS) ×1 IMPLANT
CATH BEACON 5 .038 40 KMP TP (CATHETERS) ×1
COVER PROBE U/S 5X48 (MISCELLANEOUS) ×2 IMPLANT
DEVICE PRESTO INFLATION (MISCELLANEOUS) ×2 IMPLANT
DEVICE TORQUE .025-.038 (MISCELLANEOUS) ×2 IMPLANT
DRAPE BRACHIAL (DRAPES) ×2 IMPLANT
GUIDEWIRE ANGLED .035 180CM (WIRE) ×2 IMPLANT
NEEDLE ENTRY 21GA 7CM ECHOTIP (NEEDLE) ×2 IMPLANT
PACK ANGIOGRAPHY (CUSTOM PROCEDURE TRAY) ×2 IMPLANT
SET INTRO CAPELLA COAXIAL (SET/KITS/TRAYS/PACK) ×2 IMPLANT
SHEATH BRITE TIP 6FRX5.5 (SHEATH) ×2 IMPLANT
SUT MNCRL AB 4-0 PS2 18 (SUTURE) ×2 IMPLANT
WIRE MAGIC TOR.035 180C (WIRE) ×2 IMPLANT
WIRE NITINOL .018 (WIRE) ×2 IMPLANT

## 2019-01-18 NOTE — H&P (Signed)
[facility] History & Physical Update  The patient was interviewed and re-examined.  The patient's previous History and Physical has been reviewed and is unchanged.  There is no change in the plan of care. We plan to proceed with the scheduled procedure.  Hortencia Pilar, MD  01/18/2019, 10:59 AM

## 2019-01-18 NOTE — Discharge Instructions (Signed)
Moderate Conscious Sedation, Adult, Care After These instructions provide you with information about caring for yourself after your procedure. Your health care provider may also give you more specific instructions. Your treatment has been planned according to current medical practices, but problems sometimes occur. Call your health care provider if you have any problems or questions after your procedure. What can I expect after the procedure? After your procedure, it is common:  To feel sleepy for several hours.  To feel clumsy and have poor balance for several hours.  To have poor judgment for several hours.  To vomit if you eat too soon. Follow these instructions at home: For at least 24 hours after the procedure:   Do not: ? Participate in activities where you could fall or become injured. ? Drive. ? Use heavy machinery. ? Drink alcohol. ? Take sleeping pills or medicines that cause drowsiness. ? Make important decisions or sign legal documents. ? Take care of children on your own.  Rest. Eating and drinking  Follow the diet recommended by your health care provider.  If you vomit: ? Drink water, juice, or soup when you can drink without vomiting. ? Make sure you have little or no nausea before eating solid foods. General instructions  Have a responsible adult stay with you until you are awake and alert.  Take over-the-counter and prescription medicines only as told by your health care provider.  If you smoke, do not smoke without supervision.  Keep all follow-up visits as told by your health care provider. This is important. Contact a health care provider if:  You keep feeling nauseous or you keep vomiting.  You feel light-headed.  You develop a rash.  You have a fever. Get help right away if:  You have trouble breathing. This information is not intended to replace advice given to you by your health care provider. Make sure you discuss any questions you have  with your health care provider. Document Revised: 12/05/2016 Document Reviewed: 04/14/2015 Elsevier Patient Education  2020 Else[employer] Inc[employer] Dialysis Fistulogram, Care After This sheet gives you information about how to care for yourself after your procedure. Your health care provider may also give you more specific instructions. If you have problems or questions, contact your health care provider. What can I expect after the procedure? After the procedure, it is common to have:  A small amount of discomfort in the area where the small, thin tube (catheter) was placed for the procedure.  A small amount of bruising around the fistula.  Sleepiness and tiredness (fatigue). Follow these instructions at home: Activity   Rest at home and do not lift anything that is heavier than 5 lb (2.3 kg) on the day after your procedure.  Return to your normal activities as told by your health care provider. Ask your health care provider what activities are safe for you.  Do not drive or use heavy machinery while taking prescription pain medicine.  Do not drive for 24 hours if you were given a medicine to help you relax (sedative) during your procedure. Medicines   Take over-the-counter and prescription medicines only as told by your health care provider. Puncture site care  Follow instructions from your health care provider about how to take care of the site where catheters were inserted. Make sure you: ? Wash your hands with soap and water before you change your bandage (dressing). If soap and water are not available, use hand sanitizer. ? Change your dressing as told by your health   care provider. ? Leave stitches (sutures), skin glue, or adhesive strips in place. These skin closures may need to stay in place for 2 weeks or longer. If adhesive strip edges start to loosen and curl up, you may trim the loose edges. Do not remove adhesive strips completely unless your health care provider tells you to do  that.  Check your puncture area every day for signs of infection. Check for: ? Redness, swelling, or pain. ? Fluid or blood. ? Warmth. ? Pus or a bad smell. General instructions  Do not take baths, swim, or use a hot tub until your health care provider approves. Ask your health care provider if you may take showers. You may only be allowed to take sponge baths.  Monitor your dialysis fistula closely. Check to make sure that you can feel a vibration or buzz (a thrill) when you put your fingers over the fistula.  Prevent damage to your graft or fistula: ? Do not wear tight-fitting clothing or jewelry on the arm or leg that has your graft or fistula. ? Tell all your health care providers that you have a dialysis fistula or graft. ? Do not allow blood draws, IVs, or blood pressure readings to be done in the arm that has your fistula or graft. ? Do not allow flu shots or vaccinations in the arm with your fistula or graft.  Keep all follow-up visits as told by your health care provider. This is important. Contact a health care provider if:  You have redness, swelling, or pain at the site where the catheter was put in.  You have fluid or blood coming from the catheter site.  The catheter site feels warm to the touch.  You have pus or a bad smell coming from the catheter site.  You have a fever or chills. Get help right away if:  You feel weak.  You have trouble balancing.  You have trouble moving your arms or legs.  You have problems with your speech or vision.  You can no longer feel a vibration or buzz when you put your fingers over your dialysis fistula.  The limb that was used for the procedure: ? Swells. ? Is painful. ? Is cold. ? Is discolored, such as blue or pale white.  You have chest pain or shortness of breath. Summary  After a dialysis fistulogram, it is common to have a small amount of discomfort or bruising in the area where the small, thin tube (catheter)  was placed.  Rest at home on the day after your procedure. Return to your normal activities as told by your health care provider.  Take over-the-counter and prescription medicines only as told by your health care provider.  Follow instructions from your health care provider about how to take care of the site where the catheter was inserted.  Keep all follow-up visits as told by your health care provider. This information is not intended to replace advice given to you by your health care provider. Make sure you discuss any questions you have with your health care provider. Document Revised: 01/23/2017 Document Reviewed: 01/23/2017 Elsevier Patient Education  2020 Elsevier Inc.  

## 2019-01-18 NOTE — Op Note (Signed)
OPERATIVE NOTE   PROCEDURE: 1. Contrast injection left brachiocephalic AV access 2. Percutaneous transluminal angioplasty arterial anastomosis left brachiocephalic fistula  PRE-OPERATIVE DIAGNOSIS: Complication of dialysis access                                                       End Stage Renal Disease  POST-OPERATIVE DIAGNOSIS: same as above   SURGEON: Katha Cabal, M.D.  ANESTHESIA: Conscious sedation was administered under my direct supervision by the interventional radiology RN. IV Versed plus fentanyl were utilized. Continuous ECG, pulse oximetry and blood pressure was monitored throughout the entire procedure.  Conscious sedation was for a total of 30.  ESTIMATED BLOOD LOSS: minimal  FINDING(S): Stricture of the AV graft  SPECIMEN(S):  None  CONTRAST: 60 cc  FLUOROSCOPY TIME: 6.8 minutes  INDICATIONS: Nathaniel [name] is a 65 y.o. male who  presents with malfunctioning left arm brachiocephalic AV access.  The patient is scheduled for angiography with possible intervention of the AV access.  The patient is aware the risks include but are not limited to: bleeding, infection, thrombosis of the cannulated access, and possible anaphylactic reaction to the contrast.  The patient acknowledges if the access can not be salvaged a tunneled catheter will be needed and will be placed during this procedure.  The patient is aware of the risks of the procedure and elects to proceed with the angiogram and intervention.  DESCRIPTION: After full informed written consent was obtained, the patient was brought back to the Special Procedure suite and placed supine position.  Appropriate cardiopulmonary monitors were placed.  The left arm was prepped and draped in the standard fashion.  Appropriate timeout is called. The left brachiocephalic fistula was cannulated with a micropuncture needle proximally in a retrograde direction.  Cannulation was performed with ultrasound guidance. Ultrasound  was placed in a sterile sleeve, the AV access was interrogated and noted to be echolucent and compressible indicating patency. Image was recorded for the permanent record. The puncture is performed under continuous ultrasound visualization.   The microwire was advanced and the needle was exchanged for  a microsheath.  The J-wire was then advanced and a 6 Fr sheath inserted.  Floppy Glidewire and Kumpe catheter were then negotiated into the brachial artery and hand injections were completed to image the access from the arterial anastomosis through the entire access.  The central venous structures were also imaged by hand injections.  Diagnostic interpretation: There is a greater than 70% stenosis at the arterial anastomosis.  Visualized portions of the brachial artery as well as the fistula itself are otherwise widely patent.  Confluence of the cephalic vein subclavian is widely patent and the central veins are widely patent.  There are several large tributaries noted from the cephalic vein proper.  Based on the images,  3000 units of heparin was given and a wire was negotiated through the strictures within the venous portion of the graft.  First a 4 mm x 40 mm Lutonix drug-eluting balloon was used.  Inflation was to 14 atm for 1 minute.  Subsequently a 5 mm x 40 mm Lutonix drug-eluting balloon was used to treat the arterial anastomosis again the inflation was to 14 atm for 1 minute.  Follow-up imaging demonstrated wide patency with less than 20% residual stenosis  A 4-0 Monocryl purse-string suture  was sewn around the sheath.  The sheath was removed and light pressure was applied.  A sterile bandage was applied to the puncture site.    COMPLICATIONS: None  CONDITION: Carlynn Purl, M.D [facility] Office: [phone]  01/18/2019 12:19 PM

## 2019-01-20 ENCOUNTER — Inpatient Hospital Stay: Payer: Medicaid Other | Attending: Radiation Oncology

## 2019-01-27 ENCOUNTER — Ambulatory Visit: Payer: Medicaid Other | Admitting: Radiation Oncology

## 2019-02-10 ENCOUNTER — Ambulatory Visit (INDEPENDENT_AMBULATORY_CARE_PROVIDER_SITE_OTHER): Payer: Medicaid Other | Admitting: Vascular Surgery

## 2019-02-10 ENCOUNTER — Ambulatory Visit (INDEPENDENT_AMBULATORY_CARE_PROVIDER_SITE_OTHER): Payer: Medicaid Other

## 2019-02-10 ENCOUNTER — Other Ambulatory Visit: Payer: Self-pay

## 2019-02-10 ENCOUNTER — Encounter (INDEPENDENT_AMBULATORY_CARE_PROVIDER_SITE_OTHER): Payer: Self-pay | Admitting: Vascular Surgery

## 2019-02-10 ENCOUNTER — Other Ambulatory Visit (INDEPENDENT_AMBULATORY_CARE_PROVIDER_SITE_OTHER): Payer: Self-pay | Admitting: Vascular Surgery

## 2019-02-10 VITALS — BP 144/73 | HR 77 | Resp 18 | Wt 231.0 lb

## 2019-02-10 DIAGNOSIS — N185 Chronic kidney disease, stage 5: Secondary | ICD-10-CM | POA: Diagnosis not present

## 2019-02-10 DIAGNOSIS — I10 Essential (primary) hypertension: Secondary | ICD-10-CM | POA: Diagnosis not present

## 2019-02-10 DIAGNOSIS — N186 End stage renal disease: Secondary | ICD-10-CM | POA: Diagnosis not present

## 2019-02-10 DIAGNOSIS — J449 Chronic obstructive pulmonary disease, unspecified: Secondary | ICD-10-CM

## 2019-02-10 DIAGNOSIS — E782 Mixed hyperlipidemia: Secondary | ICD-10-CM

## 2019-02-10 DIAGNOSIS — I25118 Atherosclerotic heart disease of native coronary artery with other forms of angina pectoris: Secondary | ICD-10-CM | POA: Diagnosis not present

## 2019-02-10 NOTE — Progress Notes (Signed)
MRN : [id number]  Nathaniel [name] is a 64 y.o. ([birth year]) male who presents with chief complaint of  Chief Complaint  Patient presents with  . Follow-up    [facility] 3 week follow up  .  History of Present Illness:   The patient returns to the office for followup status post intervention of the dialysis access on 01/18/2019.  Following the intervention the access function has significantly improved, with better flow rates.  The patient has not been experiencing increased bleeding times following decannulation and the patient denies increased recirculation. The patient denies an increase in arm swelling. At the present time the patient denies hand pain.  The patient denies amaurosis fugax or recent TIA symptoms. There are no recent neurological changes noted. The patient denies claudication symptoms or rest pain symptoms. The patient denies history of DVT, PE or superficial thrombophlebitis. The patient denies recent episodes of angina or shortness of breath.   Duplex ultrasound of the brachiocephalic AV fistula left upper extremity obtained today demonstrates a widely patent fistula with improved flow volumes, today's recorded flow volume is 2204 mL/min.  The velocities throughout the fistula are now more uniform.    Current Meds  Medication Sig  . albuterol (VENTOLIN HFA) 108 (90 Base) MCG/ACT inhaler Inhale 2 puffs into the lungs every 6 (six) hours as needed for wheezing or shortness of breath.  Marland Kitchen azithromycin (ZITHROMAX) 250 MG tablet Take 250 mg by mouth at bedtime.  . calcium acetate (PHOSLO) 667 MG capsule Take 667 mg by mouth 2 (two) times daily.  . Cholecalciferol (VITAMIN D3) 50 MCG (2000 UT) TABS Take 2,000 Units by mouth at bedtime.  . fluticasone (FLONASE) 50 MCG/ACT nasal spray Place 2 sprays into both nostrils daily as needed for allergies.   . Fluticasone-Umeclidin-Vilant (TRELEGY ELLIPTA) 100-62.5-25 MCG/INH AEPB Inhale 1 puff into the lungs daily.  . furosemide  (LASIX) 20 MG tablet Take 20 mg by mouth 2 (two) times daily. Morning & afternoon.  . lidocaine-prilocaine (EMLA) cream Apply 1 application topically as needed (prior to access for dialysis.).  Marland Kitchen Multiple Vitamin (MULTIVITAMIN WITH MINERALS) TABS tablet Take 1 tablet by mouth daily.  Marland Kitchen oxyCODONE-acetaminophen (PERCOCET/ROXICET) 5-325 MG tablet Take 1 tablet by mouth 4 (four) times daily as needed for moderate pain.   . pravastatin (PRAVACHOL) 40 MG tablet Take 40 mg by mouth at bedtime.    Past Medical History:  Diagnosis Date  . Anemia   . Arthritis   . Chronic back pain    Dr. Quay Burow manages (per pt)  . Chronic kidney disease   . COPD (chronic obstructive pulmonary disease) ([place])   . Dyspnea    with exertion  . GERD (gastroesophageal reflux disease)    h/o  . Hearing loss   . Hyperlipidemia   . MGUS (monoclonal gammopathy of unknown significance)   . Multiple myeloma ([place]) 2011   and prostate ca in 2019  . Proteinuria   . Tobacco abuse     Past Surgical History:  Procedure Laterality Date  . A/V FISTULAGRAM Left 01/18/2019   Procedure: A/V FISTULAGRAM;  Surgeon: Katha Cabal, MD;  Location: [facility];  Service: Cardiovascular;  Laterality: Left;  . AV FISTULA PLACEMENT Left 10/29/2018   Procedure: ARTERIOVENOUS (AV) FISTULA CREATION ( BRACHIAL CEPHALIC);  Surgeon: Katha Cabal, MD;  Location: [facility];  Service: Vascular;  Laterality: Left;  . COLONOSCOPY WITH PROPOFOL N/A 07/04/2014   Procedure: COLONOSCOPY WITH PROPOFOL;  Surgeon: Lucilla Lame, MD;  Location: [facility];  Service: Endoscopy;  Laterality: N/A;  . TONSILLECTOMY     as child    Social History Social History   Tobacco Use  . Smoking status: Former Smoker    Packs/day: 1.00    Years: 50.00    Pack years: 50.00    Types: Cigarettes    Quit date: 07/2015    Years since quitting: 3.6  . Smokeless tobacco: Never Used  . Tobacco comment: smoked 1-2 packs a day for 50 years    Substance Use Topics  . Alcohol use: No    Alcohol/week: 0.0 standard drinks  . Drug use: No    Family History Family History  Problem Relation Age of Onset  . Heart disease Brother 69       MI  . Colon cancer Neg Hx   . Liver disease Neg Hx   . Prostate cancer Neg Hx   . Kidney cancer Neg Hx   . Bladder Cancer Neg Hx     Allergies  Allergen Reactions  . Albuterol Other (See Comments)    Muscle cramps     REVIEW OF SYSTEMS (Negative unless checked)  Constitutional: [] Weight loss  [] Fever  [] Chills Cardiac: [] Chest pain   [] Chest pressure   [] Palpitations   [] Shortness of breath when laying flat   [] Shortness of breath with exertion. Vascular:  [] Pain in legs with walking   [] Pain in legs at rest  [] History of DVT   [] Phlebitis   [] Swelling in legs   [] Varicose veins   [] Non-healing ulcers Pulmonary:   [] Uses home oxygen   [] Productive cough   [] Hemoptysis   [] Wheeze  [] COPD   [] Asthma Neurologic:  [] Dizziness   [] Seizures   [] History of stroke   [] History of TIA  [] Aphasia   [] Vissual changes   [] Weakness or numbness in arm   [] Weakness or numbness in leg Musculoskeletal:   [] Joint swelling   [] Joint pain   [] Low back pain Hematologic:  [] Easy bruising  [] Easy bleeding   [] Hypercoagulable state   [] Anemic Gastrointestinal:  [] Diarrhea   [] Vomiting  [] Gastroesophageal reflux/heartburn   [] Difficulty swallowing. Genitourinary:  [x] Chronic kidney disease   [] Difficult urination  [] Frequent urination   [] Blood in urine Skin:  [] Rashes   [] Ulcers  Psychological:  [] History of anxiety   []  History of major depression.  Physical Examination  Vitals:   02/10/19 1013  BP: (!) 144/73  Pulse: 77  Resp: 18  Weight: 231 lb (104.8 kg)   Body mass index is 31.33 kg/m. Gen: WD/WN, NAD Head: [place]/AT, No temporalis wasting.  Ear/Nose/Throat: Hearing grossly intact, nares w/o erythema or drainage Eyes: PER, EOMI, sclera nonicteric.  Neck: Supple, no large masses.   Pulmonary:   Good air movement, no audible wheezing bilaterally, no use of accessory muscles.  Cardiac: RRR, no JVD Vascular:  Left upper arm fistula with good thrill good bruit Vessel Right Left  Radial Palpable Palpable  Brachial Palpable Palpable  Gastrointestinal: Non-distended. No guarding/no peritoneal signs.  Musculoskeletal: M/S 5/5 throughout.  No deformity or atrophy.  Neurologic: CN 2-12 intact. Symmetrical.  Speech is fluent. Motor exam as listed above. Psychiatric: Judgment intact, Mood & affect appropriate for pt's clinical situation. Dermatologic: No rashes or ulcers noted.  No changes consistent with cellulitis. Lymph : No lichenification or skin changes of chronic lymphedema.  CBC Lab Results  Component Value Date   WBC 4.6 10/26/2018   HGB 12.4 (L) 10/26/2018   HCT 38.9 (L) 10/26/2018   MCV 95.8  10/26/2018   PLT 141 (L) 10/26/2018    BMET    Component Value Date/Time   NA 140 10/26/2018 1410   K 4.6 10/26/2018 1410   CL 108 10/26/2018 1410   CO2 24 10/26/2018 1410   GLUCOSE 81 10/26/2018 1410   BUN 57 (H) 10/26/2018 1410   CREATININE 4.33 (H) 10/26/2018 1410   CALCIUM 9.3 10/26/2018 1410   GFRNONAA 14 (L) 10/26/2018 1410   GFRAA 16 (L) 10/26/2018 1410   CrCl cannot be calculated (Patient's most recent lab result is older than the maximum 21 days allowed.).  COAG Lab Results  Component Value Date   INR 1.0 10/26/2018    Radiology PERIPHERAL VASCULAR CATHETERIZATION  Result Date: 01/18/2019 See op note    Assessment/Plan 1. Chronic renal insufficiency, stage V (HCC) Recommend:  The patient is doing well and currently has adequate dialysis access.  It is OK to begin cannulation  Flow pattern is improved when compared to the prior ultrasound.  The patient should have a duplex ultrasound of the dialysis access in 6 months.  The patient will follow-up with me in the office after each ultrasound   - VAS [place] (AVF, AVG); Future  2.  Benign essential HTN Continue antihypertensive medications as already ordered, these medications have been reviewed and there are no changes at this time.   3. Coronary artery disease of native artery of native heart with stable angina pectoris (HCC) Continue cardiac and antihypertensive medications as already ordered and reviewed, no changes at this time.  Continue statin as ordered and reviewed, no changes at this time  Nitrates PRN for chest pain   4. Chronic obstructive pulmonary disease, unspecified COPD type ([place]) Continue pulmonary medications and aerosols as already ordered, these medications have been reviewed and there are no changes at this time.    5. Mixed hyperlipidemia Continue statin as ordered and reviewed, no changes at this time     Hortencia Pilar, MD  02/10/2019 10:21 AM

## 2019-02-13 ENCOUNTER — Encounter (INDEPENDENT_AMBULATORY_CARE_PROVIDER_SITE_OTHER): Payer: Self-pay | Admitting: Vascular Surgery

## 2019-02-28 ENCOUNTER — Telehealth: Payer: Self-pay | Admitting: Pulmonary Disease

## 2019-03-01 ENCOUNTER — Telehealth (INDEPENDENT_AMBULATORY_CARE_PROVIDER_SITE_OTHER): Payer: Self-pay | Admitting: Vascular Surgery

## 2019-03-01 NOTE — Telephone Encounter (Signed)
Called pt to inform him that DG was not in clinic this week but this message would be forwarded to her and I will reach out to him to let him know if she is able to complete the letter.

## 2019-03-02 NOTE — Telephone Encounter (Signed)
Please see below message and advise.

## 2019-03-02 NOTE — Telephone Encounter (Signed)
Faxed over requested to [facility]

## 2019-03-02 NOTE — Telephone Encounter (Signed)
Called pt to inform him what DG recommended. He verbalized his understanding and nothing further needed.

## 2019-03-02 NOTE — Telephone Encounter (Signed)
This is best addressed by his nephrologist/oncologist.  I cannot attest to dialysis and radiation needs.  From the COPD standpoint he needs the shot.

## 2019-03-04 ENCOUNTER — Other Ambulatory Visit: Payer: Self-pay | Admitting: Pulmonary Disease

## 2019-03-29 ENCOUNTER — Telehealth (INDEPENDENT_AMBULATORY_CARE_PROVIDER_SITE_OTHER): Payer: Self-pay

## 2019-03-29 NOTE — Telephone Encounter (Addendum)
Patient emergency contact called stating that her husband goes to [facility] on [address] and they are having a difficult time not able to find the veins. The patient emergency contact informed that the patient is able to do dialysis 50% of time and he has a lot of bruises and has had hematoma under the skin. The patient was able to use dialysis on 03/26/19 but before that he was not able to do dialysis for a whole week. The patient emergency contact is asking for the sonogram for the patient dialysis access but at this time our office is not able to provide. I spoke with Hinton Dyer from [place] and she informed that at times the patient is difficult to stick and sometimes the patient refuses. Hinton Dyer informed today the patient was difficult to stick and refuse to be restick then he  left. Hinton Dyer informed that the patient stated today that if his arm is still sore that he will not be coming on Thursday. Hinton Dyer and Dr Percell Miller spoke today discussing the patient may need to have a PD cath placement but the patient has refuesd in the past and postpone dialysis for a month. The patient emergency contact stated that she will be researching to receive information.

## 2019-05-09 ENCOUNTER — Other Ambulatory Visit: Payer: Self-pay | Admitting: Pulmonary Disease

## 2019-05-19 ENCOUNTER — Ambulatory Visit (INDEPENDENT_AMBULATORY_CARE_PROVIDER_SITE_OTHER): Payer: Medicaid Other | Admitting: Vascular Surgery

## 2019-05-19 ENCOUNTER — Encounter (INDEPENDENT_AMBULATORY_CARE_PROVIDER_SITE_OTHER): Payer: Medicaid Other

## 2019-06-20 ENCOUNTER — Encounter (INDEPENDENT_AMBULATORY_CARE_PROVIDER_SITE_OTHER): Payer: Self-pay | Admitting: Vascular Surgery

## 2019-06-20 ENCOUNTER — Other Ambulatory Visit: Payer: Self-pay

## 2019-06-20 ENCOUNTER — Ambulatory Visit (INDEPENDENT_AMBULATORY_CARE_PROVIDER_SITE_OTHER): Payer: Medicare Other

## 2019-06-20 ENCOUNTER — Ambulatory Visit (INDEPENDENT_AMBULATORY_CARE_PROVIDER_SITE_OTHER): Payer: Medicare Other | Admitting: Vascular Surgery

## 2019-06-20 VITALS — BP 115/65 | HR 70 | Ht 72.0 in | Wt 235.0 lb

## 2019-06-20 DIAGNOSIS — N185 Chronic kidney disease, stage 5: Secondary | ICD-10-CM

## 2019-06-20 DIAGNOSIS — T829XXS Unspecified complication of cardiac and vascular prosthetic device, implant and graft, sequela: Secondary | ICD-10-CM

## 2019-06-20 DIAGNOSIS — I25118 Atherosclerotic heart disease of native coronary artery with other forms of angina pectoris: Secondary | ICD-10-CM | POA: Diagnosis not present

## 2019-06-20 DIAGNOSIS — I10 Essential (primary) hypertension: Secondary | ICD-10-CM

## 2019-06-20 DIAGNOSIS — N186 End stage renal disease: Secondary | ICD-10-CM | POA: Diagnosis not present

## 2019-06-20 NOTE — Progress Notes (Signed)
MRN : [id number]  Nathaniel [name] is a 64 y.o. (01/11/1955) male who presents with chief complaint of  Chief Complaint  Patient presents with   Follow-up    U/S Follow up  .  History of Present Illness:   The patient returns to the office for followup status post intervention of the dialysis access on 01/18/2019.  Following the intervention the access function has significantly improved, with better flow rates.  The patient has not been experiencing increased bleeding times following decannulation and the patient denies increased recirculation. The patient denies an increase in arm swelling. At the present time the patient denies hand pain.  The patient denies amaurosis fugax or recent TIA symptoms. There are no recent neurological changes noted. The patient denies claudication symptoms or rest pain symptoms. The patient denies history of DVT, PE or superficial thrombophlebitis. The patient denies recent episodes of angina or shortness of breath.   Duplex ultrasound of the brachiocephalic AV fistula left upper extremity obtained today demonstrates a patent fistula with improved flow volumes, today's recorded flow volume is2629 mL/min.  The velocities throughout the fistula are now more uniform compared to the preintervention study.  Current Meds  Medication Sig   albuterol (VENTOLIN HFA) 108 (90 Base) MCG/ACT inhaler INHALE 2 PUFFS INTO THE LUNGS EVERY 6 HOURS AS NEEDED FOR WHEEZING OR SHORTNESS OF BREATH   fluticasone (FLONASE) 50 MCG/ACT nasal spray Place 2 sprays into both nostrils daily as needed for allergies.    Fluticasone-Umeclidin-Vilant (TRELEGY ELLIPTA) 100-62.5-25 MCG/INH AEPB Inhale 1 puff into the lungs daily.   furosemide (LASIX) 20 MG tablet Take 20 mg by mouth 2 (two) times daily. Morning & afternoon.   HYDROcodone-acetaminophen (NORCO) 5-325 MG tablet Take 1-2 tablets by mouth every 6 (six) hours as needed for severe pain.   lidocaine-prilocaine (EMLA)  cream Apply 1 application topically as needed (prior to access for dialysis.).   oxyCODONE-acetaminophen (PERCOCET/ROXICET) 5-325 MG tablet Take 1 tablet by mouth 4 (four) times daily as needed for moderate pain.     Past Medical History:  Diagnosis Date   Anemia    Arthritis    Chronic back pain    Dr. Quay Burow manages (per pt)   Chronic kidney disease    COPD (chronic obstructive pulmonary disease) ([place])    Dyspnea    with exertion   GERD (gastroesophageal reflux disease)    h/o   Hearing loss    Hyperlipidemia    MGUS (monoclonal gammopathy of unknown significance)    Multiple myeloma ([place]) 2011   and prostate ca in 2019   Proteinuria    Tobacco abuse     Past Surgical History:  Procedure Laterality Date   A/V FISTULAGRAM Left 01/18/2019   Procedure: A/V FISTULAGRAM;  Surgeon: Katha Cabal, MD;  Location: [facility];  Service: Cardiovascular;  Laterality: Left;   AV FISTULA PLACEMENT Left 10/29/2018   Procedure: ARTERIOVENOUS (AV) FISTULA CREATION ( BRACHIAL CEPHALIC);  Surgeon: Katha Cabal, MD;  Location: [facility];  Service: Vascular;  Laterality: Left;   COLONOSCOPY WITH PROPOFOL N/A 07/04/2014   Procedure: COLONOSCOPY WITH PROPOFOL;  Surgeon: Lucilla Lame, MD;  Location: [facility];  Service: Endoscopy;  Laterality: N/A;   TONSILLECTOMY     as child    Social History Social History   Tobacco Use   Smoking status: Former Smoker    Packs/day: 1.00    Years: 50.00    Pack years: 50.00    Types: Cigarettes  Quit date: 07/2015    Years since quitting: 3.9   Smokeless tobacco: Never Used   Tobacco comment: smoked 1-2 packs a day for 50 years  Vaping Use   Vaping Use: Never used  Substance Use Topics   Alcohol use: No    Alcohol/week: 0.0 standard drinks   Drug use: No    Family History Family History  Problem Relation Age of Onset   Heart disease Brother 66       MI   Colon cancer Neg Hx    Liver disease  Neg Hx    Prostate cancer Neg Hx    Kidney cancer Neg Hx    Bladder Cancer Neg Hx     Allergies  Allergen Reactions   Albuterol Other (See Comments)    Muscle cramps   No Known Allergies      REVIEW OF SYSTEMS (Negative unless checked)  Constitutional: [] Weight loss  [] Fever  [] Chills Cardiac: [] Chest pain   [] Chest pressure   [] Palpitations   [] Shortness of breath when laying flat   [] Shortness of breath with exertion. Vascular:  [] Pain in legs with walking   [] Pain in legs at rest  [] History of DVT   [] Phlebitis   [] Swelling in legs   [] Varicose veins   [] Non-healing ulcers Pulmonary:   [] Uses home oxygen   [] Productive cough   [] Hemoptysis   [] Wheeze  [] COPD   [] Asthma Neurologic:  [] Dizziness   [] Seizures   [] History of stroke   [] History of TIA  [] Aphasia   [] Vissual changes   [] Weakness or numbness in arm   [] Weakness or numbness in leg Musculoskeletal:   [] Joint swelling   [] Joint pain   [] Low back pain Hematologic:  [] Easy bruising  [] Easy bleeding   [] Hypercoagulable state   [] Anemic Gastrointestinal:  [] Diarrhea   [] Vomiting  [] Gastroesophageal reflux/heartburn   [] Difficulty swallowing. Genitourinary:  [x] Chronic kidney disease   [] Difficult urination  [] Frequent urination   [] Blood in urine Skin:  [] Rashes   [] Ulcers  Psychological:  [] History of anxiety   []  History of major depression.  Physical Examination  Vitals:   06/20/19 0827  BP: 115/65  Pulse: 70  Weight: 235 lb (106.6 kg)  Height: 6' (1.829 m)   Body mass index is 31.87 kg/m. Gen: WD/WN, NAD Head: [place]/AT, No temporalis wasting.  Ear/Nose/Throat: Hearing grossly intact, nares w/o erythema or drainage Eyes: PER, EOMI, sclera nonicteric.  Neck: Supple, no large masses.   Pulmonary:  Good air movement, no audible wheezing bilaterally, no use of accessory muscles.  Cardiac: RRR, no JVD Vascular: Left arm brachiocephalic fistula good thrill good bruit easy to palpate.  There is notation of a large  branch in the mid fistula coursing medial and posteriorly Vessel Right Left  Radial Palpable Palpable  Gastrointestinal: Non-distended. No guarding/no peritoneal signs.  Musculoskeletal: M/S 5/5 throughout.  No deformity or atrophy.  Neurologic: CN 2-12 intact. Symmetrical.  Speech is fluent. Motor exam as listed above. Psychiatric: Judgment intact, Mood & affect appropriate for pt's clinical situation. Dermatologic: No rashes or ulcers noted.  No changes consistent with cellulitis.  CBC Lab Results  Component Value Date   WBC 4.6 10/26/2018   HGB 12.4 (L) 10/26/2018   HCT 38.9 (L) 10/26/2018   MCV 95.8 10/26/2018   PLT 141 (L) 10/26/2018    BMET    Component Value Date/Time   NA 140 10/26/2018 1410   K 4.6 10/26/2018 1410   CL 108 10/26/2018 1410   CO2 24 10/26/2018 1410  GLUCOSE 81 10/26/2018 1410   BUN 57 (H) 10/26/2018 1410   CREATININE 4.33 (H) 10/26/2018 1410   CALCIUM 9.3 10/26/2018 1410   GFRNONAA 14 (L) 10/26/2018 1410   GFRAA 16 (L) 10/26/2018 1410   CrCl cannot be calculated (Patient's most recent lab result is older than the maximum 21 days allowed.).  COAG Lab Results  Component Value Date   INR 1.0 10/26/2018    Radiology No results found.   Assessment/Plan 1. Complication from renal dialysis device, sequela Recommend:  The patient is doing well and currently has adequate dialysis access. The patient's dialysis center is not reporting any access issues. Flow pattern is stable when compared to the prior ultrasound.  The patient should have a duplex ultrasound of the dialysis access in 3 months. The patient will follow-up with me in the office after each ultrasound   - VAS [place] (AVF, AVG); Future  2. End stage renal disease ([place]) At the present time the patient has adequate dialysis access.  Continue hemodialysis as ordered without interruption.  Avoid nephrotoxic medications and dehydration.  Further plans per  nephrology  3. Coronary artery disease of native artery of native heart with stable angina pectoris (HCC) Continue cardiac and antihypertensive medications as already ordered and reviewed, no changes at this time.  Continue statin as ordered and reviewed, no changes at this time  Nitrates PRN for chest pain   4. Benign essential HTN Continue antihypertensive medications as already ordered, these medications have been reviewed and there are no changes at this time.     Hortencia Pilar, MD  06/20/2019 8:49 AM

## 2019-06-21 ENCOUNTER — Encounter (INDEPENDENT_AMBULATORY_CARE_PROVIDER_SITE_OTHER): Payer: Self-pay | Admitting: Vascular Surgery

## 2019-06-21 DIAGNOSIS — T829XXA Unspecified complication of cardiac and vascular prosthetic device, implant and graft, initial encounter: Secondary | ICD-10-CM | POA: Insufficient documentation

## 2019-07-20 ENCOUNTER — Other Ambulatory Visit: Payer: Self-pay | Admitting: Pulmonary Disease

## 2019-08-11 ENCOUNTER — Ambulatory Visit (INDEPENDENT_AMBULATORY_CARE_PROVIDER_SITE_OTHER): Payer: Medicaid Other | Admitting: Vascular Surgery

## 2019-08-11 ENCOUNTER — Encounter (INDEPENDENT_AMBULATORY_CARE_PROVIDER_SITE_OTHER): Payer: Medicaid Other

## 2019-08-22 ENCOUNTER — Other Ambulatory Visit: Payer: Self-pay | Admitting: Pulmonary Disease

## 2019-12-26 ENCOUNTER — Encounter (INDEPENDENT_AMBULATORY_CARE_PROVIDER_SITE_OTHER): Payer: Medicare Other

## 2019-12-26 ENCOUNTER — Ambulatory Visit (INDEPENDENT_AMBULATORY_CARE_PROVIDER_SITE_OTHER): Payer: Medicare Other | Admitting: Vascular Surgery

## 2019-12-29 ENCOUNTER — Other Ambulatory Visit (INDEPENDENT_AMBULATORY_CARE_PROVIDER_SITE_OTHER): Payer: Self-pay | Admitting: Vascular Surgery

## 2019-12-29 DIAGNOSIS — N186 End stage renal disease: Secondary | ICD-10-CM

## 2019-12-29 DIAGNOSIS — T829XXS Unspecified complication of cardiac and vascular prosthetic device, implant and graft, sequela: Secondary | ICD-10-CM

## 2020-01-05 ENCOUNTER — Ambulatory Visit (INDEPENDENT_AMBULATORY_CARE_PROVIDER_SITE_OTHER): Payer: Medicare Other | Admitting: Nurse Practitioner

## 2020-01-05 ENCOUNTER — Other Ambulatory Visit: Payer: Self-pay

## 2020-01-05 ENCOUNTER — Ambulatory Visit (INDEPENDENT_AMBULATORY_CARE_PROVIDER_SITE_OTHER): Payer: Medicare Other

## 2020-01-05 VITALS — BP 132/81 | HR 99 | Ht 72.0 in | Wt 230.0 lb

## 2020-01-05 DIAGNOSIS — E782 Mixed hyperlipidemia: Secondary | ICD-10-CM | POA: Diagnosis not present

## 2020-01-05 DIAGNOSIS — T829XXS Unspecified complication of cardiac and vascular prosthetic device, implant and graft, sequela: Secondary | ICD-10-CM

## 2020-01-05 DIAGNOSIS — I10 Essential (primary) hypertension: Secondary | ICD-10-CM | POA: Diagnosis not present

## 2020-01-05 DIAGNOSIS — N186 End stage renal disease: Secondary | ICD-10-CM | POA: Diagnosis not present

## 2020-01-06 ENCOUNTER — Encounter (INDEPENDENT_AMBULATORY_CARE_PROVIDER_SITE_OTHER): Payer: Self-pay | Admitting: Nurse Practitioner

## 2020-01-06 NOTE — Progress Notes (Signed)
Subjective:    Patient ID: Nathaniel [name], male    DOB: Aug 03, 1955, 64 y.o.   MRN: [id number] Chief Complaint  Patient presents with  . Follow-up    U/S    The patient returns to the office for followup of their dialysis access. The function of the access has been stable. The patient denies increased bleeding time or increased recirculation. Patient denies difficulty with cannulation. The patient denies hand pain or other symptoms consistent with steal phenomena.  No significant arm swelling.  The patient denies redness or swelling at the access site. The patient denies fever or chills at home or while on dialysis.  The patient denies amaurosis fugax or recent TIA symptoms. There are no recent neurological changes noted. The patient denies claudication symptoms or rest pain symptoms. The patient denies history of DVT, PE or superficial thrombophlebitis. The patient denies recent episodes of angina or shortness of breath.   Today the patient has a flow volume of 2360.  The AV fistula is patent throughout with no evidence of clot or stenosis seen in the brachiocephalic AV fistula.   Review of Systems  Hematological: Does not bruise/bleed easily.  All other systems reviewed and are negative.      Objective:   Physical Exam Vitals reviewed.  HENT:     Head: Normocephalic.  Cardiovascular:     Rate and Rhythm: Normal rate.     Pulses: Normal pulses.  Pulmonary:     Effort: Pulmonary effort is normal.  Musculoskeletal:        General: Normal range of motion.  Skin:    General: Skin is warm and dry.  Neurological:     Mental Status: He is alert and oriented to person, place, and time.  Psychiatric:        Mood and Affect: Mood normal.        Behavior: Behavior normal.        Thought Content: Thought content normal.        Judgment: Judgment normal.     BP 132/81   Pulse 99   Ht 6' (1.829 m)   Wt 230 lb (104.3 kg)   BMI 31.19 kg/m   Past Medical History:   Diagnosis Date  . Anemia   . Arthritis   . Chronic back pain    Dr. Quay Burow manages (per pt)  . Chronic kidney disease   . COPD (chronic obstructive pulmonary disease) ([place])   . Dyspnea    with exertion  . GERD (gastroesophageal reflux disease)    h/o  . Hearing loss   . Hyperlipidemia   . MGUS (monoclonal gammopathy of unknown significance)   . Multiple myeloma ([place]) 2011   and prostate ca in 2019  . Proteinuria   . Tobacco abuse     Social History   Socioeconomic History  . Marital status: Married    Spouse name: Not on file  . Number of children: 2  . Years of education: Not on file  . Highest education level: Not on file  Occupational History  . Not on file  Tobacco Use  . Smoking status: Former Smoker    Packs/day: 1.00    Years: 50.00    Pack years: 50.00    Types: Cigarettes    Quit date: 07/2015    Years since quitting: 4.5  . Smokeless tobacco: Never Used  . Tobacco comment: smoked 1-2 packs a day for 50 years  Vaping Use  . Vaping Use: Never used  Substance and Sexual Activity  . Alcohol use: No    Alcohol/week: 0.0 standard drinks  . Drug use: No  . Sexual activity: Yes  Other Topics Concern  . Not on file  Social History Narrative   Disabled, 2 sons-healthy   Social Determinants of Health   Financial Resource Strain: Not on file  Food Insecurity: Not on file  Transportation Needs: Not on file  Physical Activity: Not on file  Stress: Not on file  Social Connections: Not on file  Intimate Partner Violence: Not on file    Past Surgical History:  Procedure Laterality Date  . A/V FISTULAGRAM Left 01/18/2019   Procedure: A/V FISTULAGRAM;  Surgeon: Katha Cabal, MD;  Location: [facility];  Service: Cardiovascular;  Laterality: Left;  . AV FISTULA PLACEMENT Left 10/29/2018   Procedure: ARTERIOVENOUS (AV) FISTULA CREATION ( BRACHIAL CEPHALIC);  Surgeon: Katha Cabal, MD;  Location: [facility];  Service: Vascular;  Laterality:  Left;  . COLONOSCOPY WITH PROPOFOL N/A 07/04/2014   Procedure: COLONOSCOPY WITH PROPOFOL;  Surgeon: Lucilla Lame, MD;  Location: [facility];  Service: Endoscopy;  Laterality: N/A;  . TONSILLECTOMY     as child    Family History  Problem Relation Age of Onset  . Heart disease Brother 76       MI  . Colon cancer Neg Hx   . Liver disease Neg Hx   . Prostate cancer Neg Hx   . Kidney cancer Neg Hx   . Bladder Cancer Neg Hx     Allergies  Allergen Reactions  . Albuterol Other (See Comments)    Muscle cramps  . No Known Allergies     CBC Latest Ref Rng & Units 10/26/2018 03/04/2018 02/18/2018  WBC 4.0 - 10.5 K/uL 4.6 3.1(L) 4.2  Hemoglobin 13.0 - 17.0 g/dL 12.4(L) 11.9(L) 12.8(L)  Hematocrit 39.0 - 52.0 % 38.9(L) 37.7(L) 41.5  Platelets 150 - 400 K/uL 141(L) 113(L) 124(L)      CMP     Component Value Date/Time   NA 140 10/26/2018 1410   K 4.6 10/26/2018 1410   CL 108 10/26/2018 1410   CO2 24 10/26/2018 1410   GLUCOSE 81 10/26/2018 1410   BUN 57 (H) 10/26/2018 1410   CREATININE 4.33 (H) 10/26/2018 1410   CALCIUM 9.3 10/26/2018 1410   GFRNONAA 14 (L) 10/26/2018 1410   GFRAA 16 (L) 10/26/2018 1410     No results found.     Assessment & Plan:   1. ESRD (end stage renal disease) ([place]) Recommend:  The patient is doing well and currently has adequate dialysis access. The patient's dialysis center is not reporting any access issues. Flow pattern is stable when compared to the prior ultrasound.  The patient should have a duplex ultrasound of the dialysis access in 6 months. The patient will follow-up with me in the office after each ultrasound     2. Benign essential HTN Continue antihypertensive medications as already ordered, these medications have been reviewed and there are no changes at this time.   3. Mixed hyperlipidemia Continue statin as ordered and reviewed, no changes at this time   Current Outpatient Medications on File Prior to Visit   Medication Sig Dispense Refill  . albuterol (VENTOLIN HFA) 108 (90 Base) MCG/ACT inhaler INHALE 2 PUFFS INTO THE LUNGS EVERY 6 HOURS AS NEEDED FOR WHEEZING OR SHORTNESS OF BREATH 8.5 g 0  . azithromycin (ZITHROMAX) 250 MG tablet Take 250 mg by mouth at bedtime.    Marland Kitchen  calcium acetate (PHOSLO) 667 MG capsule Take 667 mg by mouth 2 (two) times daily.    . Cholecalciferol (VITAMIN D3) 50 MCG (2000 UT) TABS Take 2,000 Units by mouth at bedtime.    . fluticasone (FLONASE) 50 MCG/ACT nasal spray Place 2 sprays into both nostrils daily as needed for allergies.     . Fluticasone-Umeclidin-Vilant (TRELEGY ELLIPTA) 100-62.5-25 MCG/INH AEPB Inhale 1 puff into the lungs daily. 1 each 11  . HYDROcodone-acetaminophen (NORCO) 5-325 MG tablet Take 1-2 tablets by mouth every 6 (six) hours as needed for severe pain. 35 tablet 0  . lidocaine-prilocaine (EMLA) cream Apply 1 application topically as needed (prior to access for dialysis.).    Marland Kitchen Multiple Vitamin (MULTIVITAMIN WITH MINERALS) TABS tablet Take 1 tablet by mouth daily.    . multivitamin (RENA-VIT) TABS tablet Take 1 tablet by mouth daily.    Marland Kitchen oxyCODONE-acetaminophen (PERCOCET/ROXICET) 5-325 MG tablet Take 1 tablet by mouth 4 (four) times daily as needed for moderate pain.     . pravastatin (PRAVACHOL) 40 MG tablet Take 40 mg by mouth at bedtime.    . furosemide (LASIX) 20 MG tablet Take 20 mg by mouth 2 (two) times daily. Morning & afternoon.     No current facility-administered medications on file prior to visit.    There are no Patient Instructions on file for this visit. No follow-ups on file.   Kris Hartmann, NP

## 2020-01-13 ENCOUNTER — Other Ambulatory Visit
Admission: RE | Admit: 2020-01-13 | Discharge: 2020-01-13 | Disposition: A | Discharge status: Home / Self Care | Payer: Medicaid Other | Source: Skilled Nursing Facility | Attending: Nephrology | Admitting: Nephrology

## 2020-01-13 DIAGNOSIS — N186 End stage renal disease: Secondary | ICD-10-CM | POA: Diagnosis not present

## 2020-01-20 LAB — POTASSIUM: Potassium: 5.3 mmol/L — ABNORMAL HIGH (ref 3.5–5.1)

## 2020-04-18 ENCOUNTER — Ambulatory Visit (INDEPENDENT_AMBULATORY_CARE_PROVIDER_SITE_OTHER): Payer: Medicare Other | Admitting: Pulmonary Disease

## 2020-04-18 ENCOUNTER — Other Ambulatory Visit: Payer: Self-pay

## 2020-04-18 ENCOUNTER — Encounter: Payer: Self-pay | Admitting: Pulmonary Disease

## 2020-04-18 VITALS — BP 120/76 | HR 84 | Temp 97.3°F | Ht 72.0 in | Wt 235.2 lb

## 2020-04-18 DIAGNOSIS — Z91199 Patient's noncompliance with other medical treatment and regimen due to unspecified reason: Secondary | ICD-10-CM

## 2020-04-18 DIAGNOSIS — Z9119 Patient's noncompliance with other medical treatment and regimen: Secondary | ICD-10-CM

## 2020-04-18 DIAGNOSIS — D472 Monoclonal gammopathy: Secondary | ICD-10-CM

## 2020-04-18 DIAGNOSIS — R0602 Shortness of breath: Secondary | ICD-10-CM | POA: Diagnosis not present

## 2020-04-18 DIAGNOSIS — N186 End stage renal disease: Secondary | ICD-10-CM

## 2020-04-18 DIAGNOSIS — J449 Chronic obstructive pulmonary disease, unspecified: Secondary | ICD-10-CM | POA: Diagnosis not present

## 2020-04-18 DIAGNOSIS — Z992 Dependence on renal dialysis: Secondary | ICD-10-CM

## 2020-04-18 MED ORDER — BREZTRI AEROSPHERE 160-9-4.8 MCG/ACT IN AERO: 2.0000 | INHALATION_SPRAY | Freq: Two times a day (BID) | RESPIRATORY_TRACT | 0 refills | Status: DC

## 2020-04-18 MED ORDER — PREDNISONE 10 MG (21) PO TBPK: ORAL_TABLET | ORAL | 0 refills | Status: DC

## 2020-04-18 NOTE — Progress Notes (Signed)
Subjective:    Patient ID: Nathaniel [name], male    DOB: 22-Oct-1955, 65 y.o.   MRN: [id number]  HPI   Patient is 65 year old former smoker (quit 2017) with problems as noted below, who follows here for the issue of dyspnea and COPD.  He has not been seen here since 07 September 2018.  He has been on Trelegy Ellipta.  He has end-stage renal disease and is on dialysis Monday Wednesday Friday.  Has an IgG kappa MM/MGUS of uncertain significance.  Previously he had followed at [facility].  He tells me that over the last 6 months he just cannot "make it" he feels short of breath even with putting his clothes on.  His issues are aggravated by the fact that he is on dialysis and most of his sensation is that of fatigue.  He had PFTs performed 04 November 2018 that showed an FEV1 of 1.46 L or 37% predicted and an FEV1/FVC of 46%.  He had a no response to bronchodilators.  Lung volumes showed air trapping.  Diffusion capacity was moderately reduced.  Prior echocardiogram of 12 August 2018 essentially normal.  He had COVID-19 in January 2022 with no significant sequela.  On 04/05/2020 he went to urgent care and was given antibiotics (doxycycline) he notes that this improved some of his acute symptoms of cough, congestion and yellowish sputum production and was acute nonrecurrent sinusitis.  However he continues to have shortness of breath.  He had a chest x-ray performed that day unfortunately we cannot see the report nor the films.  He does have days of missed dialysis.  And had missed dialysis prior to that urgent care visit.  In addition, it was noted that he was taking his Trelegy erratically, he does not use this daily.  This has been a chronic issue for him.  He has not had any fevers, chills or sweats.  No significant sputum production.  No hemoptysis.   Review of Systems A 10 point review of systems was performed and it is as noted above otherwise negative.  Patient Active Problem List   Diagnosis Date Noted  .  Complication from renal dialysis device 06/21/2019  . End stage renal disease ([place]) 06/20/2019  . Coronary artery disease 12/19/2017  . Prostate cancer ([place]) 10/19/2017  . Special screening for malignant neoplasms, colon   . Benign neoplasm of transverse colon   . Benign neoplasm of sigmoid colon   . Rectal polyp   . Chronic obstructive pulmonary disease ([place]) 06/28/2014  . Occult blood positive stool 06/19/2014  . Acute chest pain 01/02/2014  . Benign essential HTN 01/02/2014  . 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 Breathlessness on exertion 01/02/2014  . Current tobacco use 01/02/2014  . Focal and segmental hyalinosis 12/17/2012  . 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 Local superficial swelling, mass or lump 05/26/2012  . High potassium 05/24/2012  . Lipoma of skin and subcutaneous tissue (excluding face) 05/24/2012  . Multiple myeloma ([place]) 05/24/2012  . HLD (hyperlipidemia) 05/24/2012  . Age-associated hearing loss 05/24/2012  . Chronic rhinitis 11/12/2011  . Chronic infection of sinus 11/12/2011  . Abnormal presence of protein in urine 07/13/2010  . Dermatitis, eczematoid 07/03/2010  . Disease of sebaceous glands 07/03/2010   Social History   Tobacco Use  . Smoking status: Former Smoker    Packs/day: 1.00    Years: 50.00    Pack years: 50.00    Types: Cigarettes    Quit date:  07/2015    Years since quitting: 4.7  . Smokeless tobacco: Never Used  . Tobacco comment: smoked 1-2 packs a day for 50 years  Substance Use Topics  . Alcohol use: No    Alcohol/week: 0.0 standard drinks   Allergies  Allergen Reactions  . Albuterol Other (See Comments)    Muscle cramps  . No Known Allergies    Current Meds  Medication Sig  . albuterol (VENTOLIN HFA) 108 (90 Base) MCG/ACT inhaler INHALE 2 PUFFS INTO THE LUNGS EVERY 6 HOURS AS NEEDED FOR WHEEZING OR SHORTNESS OF BREATH  . azithromycin (ZITHROMAX) 250 MG tablet Take 250 mg by mouth at  bedtime.  . calcium acetate (PHOSLO) 667 MG capsule Take 667 mg by mouth 2 (two) times daily.  . Cholecalciferol (VITAMIN D3) 50 MCG (2000 UT) TABS Take 2,000 Units by mouth at bedtime.  . fluticasone (FLONASE) 50 MCG/ACT nasal spray Place 2 sprays into both nostrils daily as needed for allergies.   . Fluticasone-Umeclidin-Vilant (TRELEGY ELLIPTA) 100-62.5-25 MCG/INH AEPB Inhale 1 puff into the lungs daily.  Marland Kitchen HYDROcodone-acetaminophen (NORCO) 5-325 MG tablet Take 1-2 tablets by mouth every 6 (six) hours as needed for severe pain.  Marland Kitchen lidocaine-prilocaine (EMLA) cream Apply 1 application topically as needed (prior to access for dialysis.).  Marland Kitchen Multiple Vitamin (MULTIVITAMIN WITH MINERALS) TABS tablet Take 1 tablet by mouth daily.  . multivitamin (RENA-VIT) TABS tablet Take 1 tablet by mouth daily.  Marland Kitchen oxyCODONE-acetaminophen (PERCOCET/ROXICET) 5-325 MG tablet Take 1 tablet by mouth 4 (four) times daily as needed for moderate pain.   . pravastatin (PRAVACHOL) 40 MG tablet Take 40 mg by mouth at bedtime.   Immunization History  Administered Date(s) Administered  . Hepatitis B, adult 11/15/2019, 12/15/2019, 01/23/2020  . Influenza-Unspecified 11/29/2014, 11/10/2015, 11/10/2019  . Moderna SARS-COV2 Booster Vaccination 02/06/2020  . Moderna Sars-Covid-2 Vaccination 03/17/2019, 04/12/2019  . PPD Test 01/02/2020  . Pneumococcal Conjugate-13 11/01/2014  . Pneumococcal Polysaccharide-23 11/01/2014, 04/02/2016       Objective:   Physical Exam BP 120/76 (BP Location: Left Arm, Patient Position: Sitting, Cuff Size: Normal)   Pulse 84   Temp (!) 97.3 F (36.3 C) (Temporal)   Ht 6' (1.829 m)   Wt 235 lb 3.2 oz (106.7 kg)   SpO2 95%   BMI 31.90 kg/m  GENERAL: Mildly overweight, well-developed well-nourished gentleman in no acute distress.  No conversational dyspnea HEAD: Normocephalic, atraumatic.  EYES: Pupils equal, round, reactive to light.  No scleral icterus.  MOUTH: Nose/mouth/throat not  examined due to masking requirements for COVID 19. NECK: Supple. No thyromegaly. Trachea midline. No JVD.  No adenopathy. PULMONARY: Good air entry bilaterally.  Scattered wheezes throughout with I to E ratio 1:3. CARDIOVASCULAR: S1 and S2. Regular rate and rhythm.  No rubs, murmurs or gallops heard. ABDOMEN: Benign. MUSCULOSKELETAL: No joint deformity, no clubbing, no edema.  NEUROLOGIC: No focal deficit, no gait disturbance, speech is fluent. SKIN: Intact,warm,dry. PSYCH: Mood and behavior normal  Ambulatory oximetry: Oxygen saturations remained 95 to 93% throughout the walk.  Patient tolerated 3 laps well approximately 750 feet.    Assessment & Plan:     ICD-10-CM   1. Shortness of breath  R06.02 DG Chest 2 View    Pulmonary Function Test [facility] Only   Multifactorial Not using his inhalers correctly' Skips dialysis which leads to volume overload Recommended compliance with his medications and dialysis PFTs  2. COPD mixed type ([place])  J44.9    Not optimized Trial of Breztri 2 puffs  twice a day [employer] while on [employer] with PFTs Prednisone taper  3. ESRD (end stage renal disease) on dialysis ([place])  N18.6    Z99.2    Emphasized need to be compliant with dialysis This issue adds complexity to his management  4. MGUS (monoclonal gammopathy of unknown significance)  D47.2    This issue adds complexity to his management We will make him more prone to exacerbations of COPD  5. Medical non-compliance  Z91.19    Most salient issue for him Needs to be compliant with COPD and ESRD management   Orders Placed This Encounter  Procedures  . DG Chest 2 View    Standing Status:   Future    Standing Expiration Date:   10/18/2020    Order Specific Question:   Reason for Exam (SYMPTOM  OR DIAGNOSIS REQUIRED)    Answer:   sob    Order Specific Question:   Preferred imaging location?    Answer:   [facility]  . Pulmonary Function Test [facility] Only    Standing Status:    Future    Standing Expiration Date:   04/18/2021    Scheduling Instructions:     3 weeks    Order Specific Question:   Full PFT: includes the following: basic spirometry, spirometry pre & post bronchodilator, diffusion capacity (DLCO), lung volumes    Answer:   Full PFT   Meds ordered this encounter  Medications  . Budeson-Glycopyrrol-Formoterol (BREZTRI AEROSPHERE) 160-9-4.8 MCG/ACT AERO    Sig: Inhale 2 puffs into the lungs in the morning and at bedtime.    Dispense:  5.9 g    Refill:  0    Order Specific Question:   Lot Number?    Answer:   [id number]    Order Specific Question:   Expiration Date?    Answer:   08/06/2021    Order Specific Question:   Manufacturer?    Answer:   [employer] [[id number]]    Order Specific Question:   Quantity    Answer:   2  . predniSONE (STERAPRED UNI-PAK 21 TAB) 10 MG (21) TBPK tablet    Sig: Take as directed in package    Dispense:  21 tablet    Refill:  0   I have discussed with the patient that he needs to [facility] with medical regimen both for management of his COPD and ESRD.  If he skips dialysis he will have issues with volume overload which will then add to his shortness of breath.  If he does not take bronchodilators as prescribed he will also have difficulties with shortness of breath due to his advanced COPD.  We will order chest x-ray today.  He was given a prednisone taper and samples of Breztri to see if we can optimize his COPD management.  He has been advised not to use the Trelegy while on the [place].  Ultimately compliance is the key to his wellbeing.  We will see him in follow-up in 3 to 4 weeks time he is to contact us prior to that time should any new difficulties arise he may see me or the nurse practitioner at that time.   Renold Don, MD [facility]   *This note was dictated using voice recognition software/Dragon.  Despite best efforts to proofread, errors can occur which can change the meaning.  Any change was purely  unintentional.

## 2020-04-18 NOTE — Patient Instructions (Signed)
I am giving you a trial of Breztri 2 puffs twice a day.  Make sure you rinse your mouth well after you use it.  DO NOT TAKE TRELEGY WHILE USING THE BREZTRI.  Let us know how you do with the Judithann Sauger so we can call the prescription into your pharmacy.  We are giving you a prednisone taper.  Take it as directed on the package.  We are arranging for chest x-ray and breathing tests.  Patient level today stayed up well.  We will see you in follow-up in 3 to 4 weeks time with either me or the nurse practitioner.

## 2020-04-20 ENCOUNTER — Encounter: Payer: Self-pay | Admitting: Pulmonary Disease

## 2020-05-02 ENCOUNTER — Telehealth: Payer: Self-pay | Admitting: Pulmonary Disease

## 2020-05-02 NOTE — Telephone Encounter (Signed)
Patient is aware of date/time of covid test prior to PFT.  

## 2020-05-07 ENCOUNTER — Other Ambulatory Visit: Payer: Self-pay

## 2020-05-07 ENCOUNTER — Other Ambulatory Visit
Admission: RE | Admit: 2020-05-07 | Discharge: 2020-05-07 | Disposition: A | Discharge status: Home / Self Care | Payer: Medicare Other | Source: Ambulatory Visit | Attending: Pulmonary Disease | Admitting: Pulmonary Disease

## 2020-05-07 DIAGNOSIS — Z20822 Contact with and (suspected) exposure to covid-19: Secondary | ICD-10-CM | POA: Insufficient documentation

## 2020-05-07 DIAGNOSIS — Z01812 Encounter for preprocedural laboratory examination: Secondary | ICD-10-CM | POA: Diagnosis present

## 2020-05-08 ENCOUNTER — Ambulatory Visit: Payer: Medicare Other | Attending: Pulmonary Disease

## 2020-05-08 DIAGNOSIS — F17210 Nicotine dependence, cigarettes, uncomplicated: Secondary | ICD-10-CM | POA: Diagnosis not present

## 2020-05-08 DIAGNOSIS — R0602 Shortness of breath: Secondary | ICD-10-CM | POA: Diagnosis present

## 2020-05-08 DIAGNOSIS — Z9114 Patient's other noncompliance with medication regimen: Secondary | ICD-10-CM | POA: Diagnosis not present

## 2020-05-08 DIAGNOSIS — Z9115 Patient's noncompliance with renal dialysis: Secondary | ICD-10-CM | POA: Diagnosis not present

## 2020-05-08 LAB — SARS CORONAVIRUS 2 (TAT 6-24 HRS): SARS Coronavirus 2: NEGATIVE

## 2020-05-09 ENCOUNTER — Ambulatory Visit (INDEPENDENT_AMBULATORY_CARE_PROVIDER_SITE_OTHER): Payer: Medicare Other | Admitting: Pulmonary Disease

## 2020-05-09 ENCOUNTER — Encounter: Payer: Self-pay | Admitting: Pulmonary Disease

## 2020-05-09 ENCOUNTER — Other Ambulatory Visit: Payer: Self-pay

## 2020-05-09 VITALS — BP 122/78 | HR 89 | Temp 98.0°F | Ht 72.0 in | Wt 236.2 lb

## 2020-05-09 DIAGNOSIS — N186 End stage renal disease: Secondary | ICD-10-CM

## 2020-05-09 DIAGNOSIS — J449 Chronic obstructive pulmonary disease, unspecified: Secondary | ICD-10-CM | POA: Diagnosis not present

## 2020-05-09 DIAGNOSIS — Z9119 Patient's noncompliance with other medical treatment and regimen: Secondary | ICD-10-CM

## 2020-05-09 DIAGNOSIS — Z992 Dependence on renal dialysis: Secondary | ICD-10-CM | POA: Diagnosis not present

## 2020-05-09 DIAGNOSIS — Z91199 Patient's noncompliance with other medical treatment and regimen due to unspecified reason: Secondary | ICD-10-CM

## 2020-05-09 NOTE — Progress Notes (Signed)
Subjective:    Patient ID: Nathaniel [name], male    DOB: April 04, 1955, 65 y.o.   MRN: [id number]  Chief Complaint  Patient presents with  . Follow-up    Review PFT-- c/o sob with exertion, dry cough at times prod with clear sputum and wheezing.     HPI Patient is a 65 year old former smoker who presents for follow-up on the issue of dyspnea.  He was last evaluated here on 18 April 2020, this is a scheduled appointment.  Patient was seen previously and placed on Trelegy Ellipta.  He has noted improvement on his symptoms with the Trelegy.  Pulmonary function testing was performed yesterday, shows that he has significant decline in function from prior.  He did not have postbronchodilator study due to erroneous documentation of albuterol allergy.  This has now been corrected.  He continues to have shortness of breath but overall feels that Trelegy is helping him.  He does not endorse any chest pain, fevers, chills or sweats.  No sputum production or hemoptysis.  He voices no other complaint.   DATA: 11/04/2018 PFTs: FEV1 1.46 L or 37% predicted, FEV1/FVC 46%, no bronchodilator response.  Air-trapping and moderately reduced diffusion capacity noted. 05/08/2020 PFTs: Patient unable to do postbronchodilator test.  FEV1 1.07 L or 28% predicted, FEV1/FVC 41%, FVC 2.62 L or 51% predicted, air trapping noted, diffusion capacity moderately reduced.   Review of Systems A 10 point review of systems was performed and it is as noted above otherwise negative.  Patient Active Problem List   Diagnosis Date Noted  . Complication from renal dialysis device 06/21/2019  . End stage renal disease ([place]) 06/20/2019  . Coronary artery disease 12/19/2017  . Prostate cancer ([place]) 10/19/2017  . Special screening for malignant neoplasms, colon   . Benign neoplasm of transverse colon   . Benign neoplasm of sigmoid colon   . Rectal polyp   . Chronic obstructive pulmonary disease ([place]) 06/28/2014  . Occult blood  positive stool 06/19/2014  . Acute chest pain 01/02/2014  . Benign essential HTN 01/02/2014  . 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 Breathlessness on exertion 01/02/2014  . Current tobacco use 01/02/2014  . Focal and segmental hyalinosis 12/17/2012  . 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 Local superficial swelling, mass or lump 05/26/2012  . High potassium 05/24/2012  . Lipoma of skin and subcutaneous tissue (excluding face) 05/24/2012  . Multiple myeloma ([place]) 05/24/2012  . HLD (hyperlipidemia) 05/24/2012  . Age-associated hearing loss 05/24/2012  . Chronic rhinitis 11/12/2011  . Chronic infection of sinus 11/12/2011  . Abnormal presence of protein in urine 07/13/2010  . Dermatitis, eczematoid 07/03/2010  . Disease of sebaceous glands 07/03/2010   Social History   Tobacco Use  . Smoking status: Former Smoker    Packs/day: 2.00    Years: 50.00    Pack years: 100.00    Types: Cigarettes    Quit date: 07/2015    Years since quitting: 4.8  . Smokeless tobacco: Never Used  Substance Use Topics  . Alcohol use: No    Alcohol/week: 0.0 standard drinks   Allergies  Allergen Reactions  . Albuterol Other (See Comments)    Muscle cramps  . No Known Allergies    Current Meds  Medication Sig  . albuterol (VENTOLIN HFA) 108 (90 Base) MCG/ACT inhaler INHALE 2 PUFFS INTO THE LUNGS EVERY 6 HOURS AS NEEDED FOR WHEEZING OR SHORTNESS OF BREATH  . calcium acetate (PHOSLO) 667 MG capsule  Take 667 mg by mouth 2 (two) times daily.  . Cholecalciferol (VITAMIN D3) 50 MCG (2000 UT) TABS Take 2,000 Units by mouth at bedtime.  . fluticasone (FLONASE) 50 MCG/ACT nasal spray Place 2 sprays into both nostrils daily as needed for allergies.   . Fluticasone-Umeclidin-Vilant (TRELEGY ELLIPTA) 100-62.5-25 MCG/INH AEPB Inhale 1 puff into the lungs daily.  Marland Kitchen HYDROcodone-acetaminophen (NORCO) 5-325 MG tablet Take 1-2 tablets by mouth every 6 (six) hours as needed for  severe pain.  Marland Kitchen lidocaine-prilocaine (EMLA) cream Apply 1 application topically as needed (prior to access for dialysis.).  Marland Kitchen Multiple Vitamin (MULTIVITAMIN WITH MINERALS) TABS tablet Take 1 tablet by mouth daily.  . multivitamin (RENA-VIT) TABS tablet Take 1 tablet by mouth daily.  Marland Kitchen oxyCODONE-acetaminophen (PERCOCET/ROXICET) 5-325 MG tablet Take 1 tablet by mouth 4 (four) times daily as needed for moderate pain.   . pravastatin (PRAVACHOL) 40 MG tablet Take 40 mg by mouth at bedtime.  . [DISCONTINUED] azithromycin (ZITHROMAX) 250 MG tablet Take 250 mg by mouth at bedtime.  . [DISCONTINUED] predniSONE (STERAPRED UNI-PAK 21 TAB) 10 MG (21) TBPK tablet Take as directed in package   Immunization History  Administered Date(s) Administered  . Hepatitis B, adult 11/15/2019, 12/15/2019, 01/23/2020  . Influenza-Unspecified 11/29/2014, 11/10/2015, 11/10/2019  . Moderna SARS-COV2 Booster Vaccination 02/06/2020  . Moderna Sars-Covid-2 Vaccination 03/17/2019, 04/12/2019  . PPD Test 01/02/2020  . Pneumococcal Conjugate-13 11/01/2014  . Pneumococcal Polysaccharide-23 11/01/2014, 04/02/2016       Objective:   Physical Exam BP 122/78 (BP Location: Left Arm, Cuff Size: Normal)   Pulse 89   Temp 98 F (36.7 C) (Temporal)   Ht 6' (1.829 m)   Wt 236 lb 3.2 oz (107.1 kg)   SpO2 96%   BMI 32.03 kg/m  GENERAL: Overweight gentleman, well-developed,  in no acute distress.  No conversational dyspnea.  Looks chronically ill. HEAD: Normocephalic, atraumatic.  EYES: Pupils equal, round, reactive to light.  No scleral icterus.  MOUTH: Nose/mouth/throat not examined due to masking requirements for COVID 19. NECK: Supple. No thyromegaly. Trachea midline. No JVD.  No adenopathy. PULMONARY: Distant breath sounds bilaterally.  No wheezes or rhonchi noted today.  Coarse breath sounds. CARDIOVASCULAR: S1 and S2. Regular rate and rhythm.  No rubs, murmurs or gallops heard.  Left upper extremity AV fistula with good  thrill. ABDOMEN: Benign. MUSCULOSKELETAL: No joint deformity, no clubbing, no edema.  NEUROLOGIC: No focal deficit, no gait disturbance, speech is fluent. SKIN: Intact,warm,dry. PSYCH: Mood and behavior normal  Ambulatory oximetry: Performed today, patient maintained oxygen saturations between 95 to 92%.  Tolerated the walk.  Ambulated 750 feet.    Assessment & Plan:     ICD-10-CM   1. Stage 4 very severe COPD by GOLD classification ([place])  J44.9 Pulse oximetry, overnight   Continue Trelegy Ellipta Overnight oximetry Follow-up in 3 months  2. ESRD (end stage renal disease) on dialysis ([place])  N18.6    Z99.2    Continue dialysis Issue adds complexity to his management  3. Medical non-compliance  Z91.19    Appears to be complying with current regimen   Orders Placed This Encounter  Procedures  . Pulse oximetry, overnight    On roomair.  DME:new start    Standing Status:   Future    Standing Expiration Date:   05/09/2021   Patient has severe COPD.  His function has declined from prior.  He needs to continue triple therapy with LABA/LAMA/ICS.  He appears to be compliant with the medication  currently.  Perform overnight oximetry to exclude potential nocturnal hypoxemia associated with COPD.  Currently does not meet criteria for oxygen with exertion.  We will see him in follow-up in 3 months time he is to contact us prior to that time should any new difficulties arise.  Renold Don, MD [facility]   *This note was dictated using voice recognition software/Dragon.  Despite best efforts to proofread, errors can occur which can change the meaning.  Any change was purely unintentional.

## 2020-05-09 NOTE — Patient Instructions (Signed)
Your oxygen level stayed at 92% when you walked today.  This is acceptable.  Continue taking the Trelegy.  Make sure you continue to rinse your mouth well after you use it.  We are going to test your oxygen level at nighttime while you sleep.  You may need some supplemental oxygen during sleep.  We will see you in follow-up in 3 months time call sooner should any new problems arise.

## 2020-05-18 ENCOUNTER — Encounter: Payer: Self-pay | Admitting: Pulmonary Disease

## 2020-06-04 ENCOUNTER — Other Ambulatory Visit: Payer: Self-pay

## 2020-06-04 ENCOUNTER — Emergency Department: Payer: Medicare Other

## 2020-06-04 ENCOUNTER — Emergency Department
Admission: EM | Admit: 2020-06-04 | Discharge: 2020-06-04 | Disposition: A | Discharge status: Home / Self Care | Payer: Medicare Other | Attending: Emergency Medicine | Admitting: Emergency Medicine

## 2020-06-04 ENCOUNTER — Encounter: Payer: Self-pay | Admitting: Intensive Care

## 2020-06-04 DIAGNOSIS — Z79899 Other long term (current) drug therapy: Secondary | ICD-10-CM | POA: Insufficient documentation

## 2020-06-04 DIAGNOSIS — Z992 Dependence on renal dialysis: Secondary | ICD-10-CM | POA: Diagnosis not present

## 2020-06-04 DIAGNOSIS — N186 End stage renal disease: Secondary | ICD-10-CM | POA: Diagnosis not present

## 2020-06-04 DIAGNOSIS — I2510 Atherosclerotic heart disease of native coronary artery without angina pectoris: Secondary | ICD-10-CM | POA: Insufficient documentation

## 2020-06-04 DIAGNOSIS — Z8546 Personal history of malignant neoplasm of prostate: Secondary | ICD-10-CM | POA: Insufficient documentation

## 2020-06-04 DIAGNOSIS — I120 Hypertensive chronic kidney disease with stage 5 chronic kidney disease or end stage renal disease: Secondary | ICD-10-CM | POA: Diagnosis not present

## 2020-06-04 DIAGNOSIS — J441 Chronic obstructive pulmonary disease with (acute) exacerbation: Secondary | ICD-10-CM

## 2020-06-04 DIAGNOSIS — F17210 Nicotine dependence, cigarettes, uncomplicated: Secondary | ICD-10-CM | POA: Diagnosis not present

## 2020-06-04 DIAGNOSIS — Z7951 Long term (current) use of inhaled steroids: Secondary | ICD-10-CM | POA: Insufficient documentation

## 2020-06-04 DIAGNOSIS — R0602 Shortness of breath: Secondary | ICD-10-CM | POA: Diagnosis present

## 2020-06-04 LAB — COMPREHENSIVE METABOLIC PANEL
ALT: 47 U/L — ABNORMAL HIGH (ref 0–44)
AST: 44 U/L — ABNORMAL HIGH (ref 15–41)
Albumin: 3.1 g/dL — ABNORMAL LOW (ref 3.5–5.0)
Alkaline Phosphatase: 220 U/L — ABNORMAL HIGH (ref 38–126)
Anion gap: 10 (ref 5–15)
BUN: 23 mg/dL (ref 8–23)
CO2: 27 mmol/L (ref 22–32)
Calcium: 8.1 mg/dL — ABNORMAL LOW (ref 8.9–10.3)
Chloride: 96 mmol/L — ABNORMAL LOW (ref 98–111)
Creatinine, Ser: 2.73 mg/dL — ABNORMAL HIGH (ref 0.61–1.24)
GFR, Estimated: 25 mL/min — ABNORMAL LOW (ref >60)
Glucose, Bld: 94 mg/dL (ref 70–99)
Potassium: 3.3 mmol/L — ABNORMAL LOW (ref 3.5–5.1)
Sodium: 133 mmol/L — ABNORMAL LOW (ref 135–145)
Total Bilirubin: 0.5 mg/dL (ref 0.3–1.2)
Total Protein: 9.1 g/dL — ABNORMAL HIGH (ref 6.5–8.1)

## 2020-06-04 LAB — CBC WITH DIFFERENTIAL/PLATELET
Abs Immature Granulocytes: 0.01 10*3/uL (ref 0.00–0.07)
Basophils Absolute: 0 10*3/uL (ref 0.0–0.1)
Basophils Relative: 0 %
Eosinophils Absolute: 0.2 10*3/uL (ref 0.0–0.5)
Eosinophils Relative: 3 %
HCT: 38.1 % — ABNORMAL LOW (ref 39.0–52.0)
Hemoglobin: 13 g/dL (ref 13.0–17.0)
Immature Granulocytes: 0 %
Lymphocytes Relative: 25 %
Lymphs Abs: 1.3 10*3/uL (ref 0.7–4.0)
MCH: 32.8 pg (ref 26.0–34.0)
MCHC: 34.1 g/dL (ref 30.0–36.0)
MCV: 96.2 fL (ref 80.0–100.0)
Monocytes Absolute: 0.7 10*3/uL (ref 0.1–1.0)
Monocytes Relative: 12 %
Neutro Abs: 3.2 10*3/uL (ref 1.7–7.7)
Neutrophils Relative %: 60 %
Platelets: 150 10*3/uL (ref 150–400)
RBC: 3.96 MIL/uL — ABNORMAL LOW (ref 4.22–5.81)
RDW: 15.8 % — ABNORMAL HIGH (ref 11.5–15.5)
WBC: 5.4 10*3/uL (ref 4.0–10.5)
nRBC: 0 % (ref 0.0–0.2)

## 2020-06-04 LAB — TROPONIN I (HIGH SENSITIVITY): Troponin I (High Sensitivity): 7 ng/L (ref <18)

## 2020-06-04 MED ORDER — AMOXICILLIN-POT CLAVULANATE 875-125 MG PO TABS
1.0000 | ORAL_TABLET | Freq: Once | ORAL | Status: AC
  Administered 2020-06-04: 1 via ORAL
  Filled 2020-06-04: qty 1

## 2020-06-04 MED ORDER — IPRATROPIUM-ALBUTEROL 0.5-2.5 (3) MG/3ML IN SOLN
9.0000 mL | Freq: Once | RESPIRATORY_TRACT | Status: AC
  Administered 2020-06-04: 9 mL via RESPIRATORY_TRACT
  Filled 2020-06-04: qty 3

## 2020-06-04 MED ORDER — PREDNISONE 20 MG PO TABS
60.0000 mg | ORAL_TABLET | Freq: Once | ORAL | Status: AC
  Administered 2020-06-04: 60 mg via ORAL
  Filled 2020-06-04: qty 3

## 2020-06-04 MED ORDER — PREDNISONE 20 MG PO TABS: 60.0000 mg | ORAL_TABLET | Freq: Every day | ORAL | 0 refills | Status: AC

## 2020-06-04 MED ORDER — ERYTHROMYCIN 5 MG/GM OP OINT: 1.0000 "application " | TOPICAL_OINTMENT | Freq: Every day | OPHTHALMIC | 0 refills | Status: DC

## 2020-06-04 MED ORDER — AMOXICILLIN-POT CLAVULANATE 875-125 MG PO TABS: 1.0000 | ORAL_TABLET | Freq: Two times a day (BID) | ORAL | 0 refills | Status: AC

## 2020-06-04 NOTE — ED Triage Notes (Addendum)
Pt c/o difficulty breathing that has only gotten worse this past week. Dialysis patient with M,W,F treatment. Completed treatment today but reported they put oxygen on him through treatment due to breathing. Patients left eye is also red, glossy, and draining pus

## 2020-06-04 NOTE — ED Provider Notes (Signed)
[facility] Emergency Department Provider Note   ____________________________________________   Event Date/Time   First MD Initiated Contact with Patient 06/04/20 1514     (approximate)  I have reviewed the triage vital signs and the nursing notes.   HISTORY  Chief Complaint Shortness of Breath    HPI Nathaniel [name] is a 65 y.o. male with past medical history of hypertension, hyperlipidemia, CAD, multiple myeloma, COPD, and ESRD on HD (MWF) who presents to the ED complaining of shortness of breath.  Patient reports that for the past couple of days he has had a cough productive of greenish sputum along with difficulty catching his breath.  He reports some soreness in his chest when he goes to cough, denies pain in his chest outside of coughing.  He has not noticed any pain or swelling in his legs and denies any fevers.  He had a full run of dialysis earlier today, was placed on oxygen during dialysis because of his trouble breathing.  He does not wear oxygen at baseline, states he has tried some breathing treatments at home with partial relief.  He denies any sick contacts and he is fully vaccinated against COVID-19.        Past Medical History:  Diagnosis Date  . Anemia   . Arthritis   . Chronic back pain    Dr. Quay Burow manages (per pt)  . Chronic kidney disease   . COPD (chronic obstructive pulmonary disease) ([place])   . Dyspnea    with exertion  . GERD (gastroesophageal reflux disease)    h/o  . Hearing loss   . Hyperlipidemia   . MGUS (monoclonal gammopathy of unknown significance)   . Multiple myeloma ([place]) 2011   and prostate ca in 2019  . Proteinuria   . Tobacco abuse     Patient Active Problem List   Diagnosis Date Noted  . Complication from renal dialysis device 06/21/2019  . End stage renal disease ([place]) 06/20/2019  . Coronary artery disease 12/19/2017  . Prostate cancer ([place]) 10/19/2017  . Special screening for malignant neoplasms,  colon   . Benign neoplasm of transverse colon   . Benign neoplasm of sigmoid colon   . Rectal polyp   . Chronic obstructive pulmonary disease ([place]) 06/28/2014  . Occult blood positive stool 06/19/2014  . Acute chest pain 01/02/2014  . Benign essential HTN 01/02/2014  . 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 Breathlessness on exertion 01/02/2014  . Current tobacco use 01/02/2014  . Focal and segmental hyalinosis 12/17/2012  . 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 Local superficial swelling, mass or lump 05/26/2012  . High potassium 05/24/2012  . Lipoma of skin and subcutaneous tissue (excluding face) 05/24/2012  . Multiple myeloma ([place]) 05/24/2012  . HLD (hyperlipidemia) 05/24/2012  . Age-associated hearing loss 05/24/2012  . Chronic rhinitis 11/12/2011  . Chronic infection of sinus 11/12/2011  . Abnormal presence of protein in urine 07/13/2010  . Dermatitis, eczematoid 07/03/2010  . Disease of sebaceous glands 07/03/2010    Past Surgical History:  Procedure Laterality Date  . A/V FISTULAGRAM Left 01/18/2019   Procedure: A/V FISTULAGRAM;  Surgeon: Katha Cabal, MD;  Location: [place] CV LAB;  Service: Cardiovascular;  Laterality: Left;  . AV FISTULA PLACEMENT Left 10/29/2018   Procedure: ARTERIOVENOUS (AV) FISTULA CREATION ( BRACHIAL CEPHALIC);  Surgeon: Katha Cabal, MD;  Location: [facility] ORS;  Service: Vascular;  Laterality: Left;  . COLONOSCOPY WITH PROPOFOL N/A 07/04/2014  Procedure: COLONOSCOPY WITH PROPOFOL;  Surgeon: Lucilla Lame, MD;  Location: [facility] ENDOSCOPY;  Service: Endoscopy;  Laterality: N/A;  . TONSILLECTOMY     as child    Prior to Admission medications   Medication Sig Start Date End Date Taking? Authorizing Provider  amoxicillin-clavulanate (AUGMENTIN) 875-125 MG tablet Take 1 tablet by mouth 2 (two) times daily for 7 days. 06/04/20 06/11/20 Yes Blake Divine, MD  predniSONE (DELTASONE) 20 MG tablet Take 3  tablets (60 mg total) by mouth daily with breakfast for 5 days. 06/04/20 06/09/20 Yes Blake Divine, MD  albuterol (VENTOLIN HFA) 108 (90 Base) MCG/ACT inhaler INHALE 2 PUFFS INTO THE LUNGS EVERY 6 HOURS AS NEEDED FOR WHEEZING OR SHORTNESS OF BREATH 08/23/19   Tyler Pita, MD  calcium acetate (PHOSLO) 667 MG capsule Take 667 mg by mouth 2 (two) times daily.    [provider]  Cholecalciferol (VITAMIN D3) 50 MCG (2000 UT) TABS Take 2,000 Units by mouth at bedtime.    [provider]  fluticasone (FLONASE) 50 MCG/ACT nasal spray Place 2 sprays into both nostrils daily as needed for allergies.  01/25/15   [provider]  Fluticasone-Umeclidin-Vilant (TRELEGY ELLIPTA) 100-62.5-25 MCG/INH AEPB Inhale 1 puff into the lungs daily. 08/05/18   Tyler Pita, MD  furosemide (LASIX) 20 MG tablet Take 20 mg by mouth 2 (two) times daily. Morning & afternoon. 09/14/18 09/14/19  [provider]  HYDROcodone-acetaminophen (NORCO) 5-325 MG tablet Take 1-2 tablets by mouth every 6 (six) hours as needed for severe pain. 10/29/18   Schnier, Dolores Lory, MD  lidocaine-prilocaine (EMLA) cream Apply 1 application topically as needed (prior to access for dialysis.).    [provider]  Multiple Vitamin (MULTIVITAMIN WITH MINERALS) TABS tablet Take 1 tablet by mouth daily.    [provider]  multivitamin (RENA-VIT) TABS tablet Take 1 tablet by mouth daily. 12/19/19   [provider]  oxyCODONE-acetaminophen (PERCOCET/ROXICET) 5-325 MG tablet Take 1 tablet by mouth 4 (four) times daily as needed for moderate pain.     [provider]  pravastatin (PRAVACHOL) 40 MG tablet Take 40 mg by mouth at bedtime.    [provider]    Allergies No known allergies  Family History  Problem Relation Age of Onset  . Heart disease Brother 47       MI  . Colon cancer Neg Hx   . Liver disease Neg Hx   . Prostate cancer Neg Hx   . Kidney cancer Neg Hx    . Bladder Cancer Neg Hx     Social History Social History   Tobacco Use  . Smoking status: Current Some Day Smoker    Packs/day: 2.00    Years: 50.00    Pack years: 100.00    Types: Cigarettes  . Smokeless tobacco: Never Used  Vaping Use  . Vaping Use: Never used  Substance Use Topics  . Alcohol use: No    Alcohol/week: 0.0 standard drinks  . Drug use: No    Review of Systems  Constitutional: No fever/chills Eyes: No visual changes. ENT: No sore throat. Cardiovascular: Positive for chest pain. Respiratory: Positive for cough and shortness of breath. Gastrointestinal: No abdominal pain.  No nausea, no vomiting.  No diarrhea.  No constipation. Genitourinary: Negative for dysuria. Musculoskeletal: Negative for back pain. Skin: Negative for rash. Neurological: Negative for headaches, focal weakness or numbness.  ____________________________________________   PHYSICAL EXAM:  VITAL SIGNS: ED Triage Vitals  Enc Vitals Group  BP 06/04/20 1500 123/70     Pulse Rate 06/04/20 1500 (!) 101     Resp 06/04/20 1500 (!) 22     Temp 06/04/20 1500 98.2 F (36.8 C)     Temp Source 06/04/20 1500 Oral     SpO2 06/04/20 1500 95 %     Weight 06/04/20 1502 235 lb (106.6 kg)     Height 06/04/20 1502 6' (1.829 m)     Head Circumference --      Peak Flow --      Pain Score 06/04/20 1502 0     Pain Loc --      Pain Edu? --      Excl. in [place]? --     Constitutional: Alert and oriented. Eyes: Conjunctivae are normal. Head: Atraumatic. Nose: No congestion/rhinnorhea. Mouth/Throat: Mucous membranes are moist. Neck: Normal ROM Cardiovascular: Normal rate, regular rhythm. Grossly normal heart sounds.  Left upper extremity AV fistula with palpable thrill. Respiratory: Normal respiratory effort.  No retractions.  Inspiratory and expiratory wheezing throughout. Gastrointestinal: Soft and nontender. No distention. Genitourinary: deferred Musculoskeletal: No lower extremity  tenderness nor edema. Neurologic:  Normal speech and language. No gross focal neurologic deficits are appreciated. Skin:  Skin is warm, dry and intact. No rash noted. Psychiatric: Mood and affect are normal. Speech and behavior are normal.  ____________________________________________   LABS (all labs ordered are listed, but only abnormal results are displayed)  Labs Reviewed  CBC WITH DIFFERENTIAL/PLATELET - Abnormal; Notable for the following components:      Result Value   RBC 3.96 (*)    HCT 38.1 (*)    RDW 15.8 (*)    All other components within normal limits  COMPREHENSIVE METABOLIC PANEL - Abnormal; Notable for the following components:   Sodium 133 (*)    Potassium 3.3 (*)    Chloride 96 (*)    Creatinine, Ser 2.73 (*)    Calcium 8.1 (*)    Total Protein 9.1 (*)    Albumin 3.1 (*)    AST 44 (*)    ALT 47 (*)    Alkaline Phosphatase 220 (*)    GFR, Estimated 25 (*)    All other components within normal limits  TROPONIN I (HIGH SENSITIVITY)   ____________________________________________  EKG  ED ECG REPORT I, Blake Divine, the attending physician, personally viewed and interpreted this ECG.   Date: 06/04/2020  EKG Time: 15:08  Rate: 99  Rhythm: normal sinus rhythm  Axis: Normal  Intervals:none  ST&T Change: None   PROCEDURES  Procedure(s) performed (including Critical Care):  Procedures   ____________________________________________   INITIAL IMPRESSION / ASSESSMENT AND PLAN / ED COURSE       65 year old male with past medical history of hypertension, hyperlipidemia, CAD, COPD, multiple myeloma, and ESRD on HD (MWF) who presents to the ED with a couple days of productive cough, shortness of breath, and soreness in his chest.  Patient is not in any respiratory distress and is maintaining O2 sats on room air, however he does have significant wheezing on exam.  Chest x-ray reviewed by me and shows no infiltrate, edema, or effusion.  I suspect his  symptoms are related to acute on chronic bronchitis with associated COPD exacerbation.  There are no signs of fluid overload and patient had full run of dialysis earlier today.  We will treat with prednisone and DuoNeb, started on antibiotics for his purulent sputum production.  EKG shows no evidence of arrhythmia or ischemia, troponin is pending  but I have low suspicion for cardiac etiology.  Lab work is unremarkable, troponin within normal limits.  Patient reports feeling much better following breathing treatments, steroids, and dose of Augmentin.  He is appropriate for discharge home with prescription for course of steroids and antibiotics, he has inhaler available at home.  He was counseled to return to the ED for new worsening symptoms, patient agrees to plan.      ____________________________________________   FINAL CLINICAL IMPRESSION(S) / ED DIAGNOSES  Final diagnoses:  COPD exacerbation [facility])     ED Discharge Orders         Ordered    amoxicillin-clavulanate (AUGMENTIN) 875-125 MG tablet  2 times daily        06/04/20 1703    predniSONE (DELTASONE) 20 MG tablet  Daily with breakfast        06/04/20 1703           Note:  This document was prepared using Dragon voice recognition software and may include unintentional dictation errors.   Blake Divine, MD 06/04/20 [phone]

## 2020-06-04 NOTE — ED Notes (Signed)
Patient transported to X-ray 

## 2020-06-04 NOTE — ED Notes (Signed)
Pt has been provided with discharge instructions. Pt denies any questions or concerns at this time. Pt verbalizes understanding for follow up care and d/c.  VSS.  Pt left department with all belongings.  

## 2020-06-24 ENCOUNTER — Other Ambulatory Visit (INDEPENDENT_AMBULATORY_CARE_PROVIDER_SITE_OTHER): Payer: Self-pay | Admitting: Nurse Practitioner

## 2020-06-24 DIAGNOSIS — N186 End stage renal disease: Secondary | ICD-10-CM

## 2020-06-28 ENCOUNTER — Other Ambulatory Visit: Payer: Self-pay

## 2020-06-28 ENCOUNTER — Ambulatory Visit (INDEPENDENT_AMBULATORY_CARE_PROVIDER_SITE_OTHER): Payer: Medicare Other

## 2020-06-28 ENCOUNTER — Ambulatory Visit (INDEPENDENT_AMBULATORY_CARE_PROVIDER_SITE_OTHER): Payer: Medicare Other | Admitting: Vascular Surgery

## 2020-06-28 ENCOUNTER — Encounter (INDEPENDENT_AMBULATORY_CARE_PROVIDER_SITE_OTHER): Payer: Self-pay | Admitting: Vascular Surgery

## 2020-06-28 VITALS — BP 128/74 | HR 84 | Resp 16 | Wt 237.0 lb

## 2020-06-28 DIAGNOSIS — N186 End stage renal disease: Secondary | ICD-10-CM

## 2020-06-28 DIAGNOSIS — I10 Essential (primary) hypertension: Secondary | ICD-10-CM

## 2020-06-28 DIAGNOSIS — I25118 Atherosclerotic heart disease of native coronary artery with other forms of angina pectoris: Secondary | ICD-10-CM | POA: Diagnosis not present

## 2020-06-28 DIAGNOSIS — E782 Mixed hyperlipidemia: Secondary | ICD-10-CM | POA: Diagnosis not present

## 2020-07-02 ENCOUNTER — Encounter (INDEPENDENT_AMBULATORY_CARE_PROVIDER_SITE_OTHER): Payer: Self-pay | Admitting: Vascular Surgery

## 2020-07-02 NOTE — Progress Notes (Signed)
MRN : [id number]  Nathaniel [name] is a 65 y.o. (Apr 22, 1955) male who presents with chief complaint of  Chief Complaint  Patient presents with   Follow-up    Ultrasound follow up  .  History of Present Illness:   The patient returns to the office for followup  of the dialysis access. Following the intervention the access function has significantly improved, with better flow rates and improved KT/V. The patient has not been experiencing increased bleeding times following decannulation and the patient denies increased recirculation. The patient denies an increase in arm swelling. At the present time the patient denies hand pain.  The patient denies amaurosis fugax or recent TIA symptoms. There are no recent neurological changes noted. The patient denies claudication symptoms or rest pain symptoms. The patient denies history of DVT, PE or superficial thrombophlebitis. The patient denies recent episodes of angina or shortness of breath.         Current Meds  Medication Sig   albuterol (VENTOLIN HFA) 108 (90 Base) MCG/ACT inhaler INHALE 2 PUFFS INTO THE LUNGS EVERY 6 HOURS AS NEEDED FOR WHEEZING OR SHORTNESS OF BREATH   calcium acetate (PHOSLO) 667 MG capsule Take 667 mg by mouth 2 (two) times daily.   Cholecalciferol (VITAMIN D3) 50 MCG (2000 UT) TABS Take 2,000 Units by mouth at bedtime.   erythromycin ophthalmic ointment Place 1 application into the left eye at bedtime.   fluticasone (FLONASE) 50 MCG/ACT nasal spray Place 2 sprays into both nostrils daily as needed for allergies.    Fluticasone-Umeclidin-Vilant (TRELEGY ELLIPTA) 100-62.5-25 MCG/INH AEPB Inhale 1 puff into the lungs daily.   lidocaine-prilocaine (EMLA) cream Apply 1 application topically as needed (prior to access for dialysis.).   Multiple Vitamin (MULTIVITAMIN WITH MINERALS) TABS tablet Take 1 tablet by mouth daily.   multivitamin (RENA-VIT) TABS tablet Take 1 tablet by mouth daily.   oxyCODONE-acetaminophen  (PERCOCET/ROXICET) 5-325 MG tablet Take 1 tablet by mouth 4 (four) times daily as needed for moderate pain.    pravastatin (PRAVACHOL) 40 MG tablet Take 40 mg by mouth at bedtime.    Past Medical History:  Diagnosis Date   Anemia    Arthritis    Chronic back pain    Dr. Quay Burow manages (per pt)   Chronic kidney disease    COPD (chronic obstructive pulmonary disease) ([place])    Dyspnea    with exertion   GERD (gastroesophageal reflux disease)    h/o   Hearing loss    Hyperlipidemia    MGUS (monoclonal gammopathy of unknown significance)    Multiple myeloma ([place]) 2011   and prostate ca in 2019   Proteinuria    Tobacco abuse     Past Surgical History:  Procedure Laterality Date   A/V FISTULAGRAM Left 01/18/2019   Procedure: A/V FISTULAGRAM;  Surgeon: Katha Cabal, MD;  Location: [place] CV LAB;  Service: Cardiovascular;  Laterality: Left;   AV FISTULA PLACEMENT Left 10/29/2018   Procedure: ARTERIOVENOUS (AV) FISTULA CREATION ( BRACHIAL CEPHALIC);  Surgeon: Katha Cabal, MD;  Location: [facility] ORS;  Service: Vascular;  Laterality: Left;   COLONOSCOPY WITH PROPOFOL N/A 07/04/2014   Procedure: COLONOSCOPY WITH PROPOFOL;  Surgeon: Lucilla Lame, MD;  Location: [facility] ENDOSCOPY;  Service: Endoscopy;  Laterality: N/A;   TONSILLECTOMY     as child    Social History Social History   Tobacco Use   Smoking status: Some Days    Packs/day: 2.00    Years: 50.00  Pack years: 100.00    Types: Cigarettes   Smokeless tobacco: Never  Vaping Use   Vaping Use: Never used  Substance Use Topics   Alcohol use: No    Alcohol/week: 0.0 standard drinks   Drug use: No    Family History Family History  Problem Relation Age of Onset   Heart disease Brother 52       MI   Colon cancer Neg Hx    Liver disease Neg Hx    Prostate cancer Neg Hx    Kidney cancer Neg Hx    Bladder Cancer Neg Hx     Allergies  Allergen Reactions   No Known Allergies      REVIEW OF SYSTEMS  (Negative unless checked)  Constitutional: [] Weight loss  [] Fever  [] Chills Cardiac: [] Chest pain   [] Chest pressure   [] Palpitations   [] Shortness of breath when laying flat   [] Shortness of breath with exertion. Vascular:  [] Pain in legs with walking   [] Pain in legs at rest  [] History of DVT   [] Phlebitis   [] Swelling in legs   [] Varicose veins   [] Non-healing ulcers Pulmonary:   [] Uses home oxygen   [] Productive cough   [] Hemoptysis   [] Wheeze  [] COPD   [] Asthma Neurologic:  [] Dizziness   [] Seizures   [] History of stroke   [] History of TIA  [] Aphasia   [] Vissual changes   [] Weakness or numbness in arm   [] Weakness or numbness in leg Musculoskeletal:   [] Joint swelling   [] Joint pain   [] Low back pain Hematologic:  [] Easy bruising  [] Easy bleeding   [] Hypercoagulable state   [] Anemic Gastrointestinal:  [] Diarrhea   [] Vomiting  [] Gastroesophageal reflux/heartburn   [] Difficulty swallowing. Genitourinary:  [x] Chronic kidney disease   [] Difficult urination  [] Frequent urination   [] Blood in urine Skin:  [] Rashes   [] Ulcers  Psychological:  [] History of anxiety   []  History of major depression.  Physical Examination  Vitals:   06/28/20 1454  BP: 128/74  Pulse: 84  Resp: 16  Weight: 237 lb (107.5 kg)   Body mass index is 32.14 kg/m. Gen: WD/WN, NAD Head: [place]/AT, No temporalis wasting.  Ear/Nose/Throat: Hearing grossly intact, nares w/o erythema or drainage Eyes: PER, EOMI, sclera nonicteric.  Neck: Supple, no large masses.   Pulmonary:  Good air movement, no audible wheezing bilaterally, no use of accessory muscles.  Cardiac: RRR, no JVD Vascular:   left arm fistula good thrill good bruit Vessel Right Left  Radial Palpable Palpable  Brachial Palpable Palpable  Gastrointestinal: Non-distended. No guarding/no peritoneal signs.  Musculoskeletal: M/S 5/5 throughout.  No deformity or atrophy.  Neurologic: CN 2-12 intact. Symmetrical.  Speech is fluent. Motor exam as listed  above. Psychiatric: Judgment intact, Mood & affect appropriate for pt's clinical situation. Dermatologic: No rashes or ulcers noted.  No changes consistent with cellulitis. Lymph : No lichenification or skin changes of chronic lymphedema.  CBC Lab Results  Component Value Date   WBC 5.4 06/04/2020   HGB 13.0 06/04/2020   HCT 38.1 (L) 06/04/2020   MCV 96.2 06/04/2020   PLT 150 06/04/2020    BMET    Component Value Date/Time   NA 133 (L) 06/04/2020 1508   K 3.3 (L) 06/04/2020 1508   CL 96 (L) 06/04/2020 1508   CO2 27 06/04/2020 1508   GLUCOSE 94 06/04/2020 1508   BUN 23 06/04/2020 1508   CREATININE 2.73 (H) 06/04/2020 1508   CALCIUM 8.1 (L) 06/04/2020 1508   GFRNONAA 25 (L)  06/04/2020 1508   GFRAA 16 (L) 10/26/2018 1410   CrCl cannot be calculated (Patient's most recent lab result is older than the maximum 21 days allowed.).  COAG Lab Results  Component Value Date   INR 1.0 10/26/2018    Radiology DG Chest 2 View  Result Date: 06/04/2020 CLINICAL DATA:  Shortness of breath. EXAM: CHEST - 2 VIEW COMPARISON:  11/12/2013 FINDINGS: The heart size and mediastinal contours are within normal limits. Both lungs are clear. The visualized skeletal structures are unremarkable. IMPRESSION: No active cardiopulmonary disease. Electronically Signed   By: Kathreen Devoid   On: 06/04/2020 15:32   VAS US DUPLEX DIALYSIS ACCESS (AVF,AVG)  Result Date: 06/28/2020 DIALYSIS ACCESS Patient Name:  NEZIAH BRALEY  Date of Exam:   06/28/2020 Medical Rec #: [id number]        Accession #:    [id number] Date of Birth: [birth year]        Patient Gender: M Patient Age:   55Y Exam Location:  [facility] Procedure:      VAS US DUPLEX DIALYSIS ACCESS (AVF, AVG) Referring Phys: [id number] Kris Hartmann --------------------------------------------------------------------------------  Access Site: Left Upper Extremity. Access Type: Brachial-cephalic AVF. History: 01/18/2019: PTA of the Anastomosis Left  Brachiocephalic AVF. Comparison Study: 01/05/2020 Performing Technologist: Almira Coaster RVS  Examination Guidelines: A complete evaluation includes B-mode imaging, spectral Doppler, color Doppler, and power Doppler as needed of all accessible portions of each vessel. Unilateral testing is considered an integral part of a complete examination. Limited examinations for reoccurring indications may be performed as noted.  Findings: +--------------------+----------+-----------------+--------+ AVF                 PSV (cm/s)Flow Vol (mL/min)Comments +--------------------+----------+-----------------+--------+ Native artery inflow   142          2442                +--------------------+----------+-----------------+--------+ AVF Anastomosis        399                              +--------------------+----------+-----------------+--------+  +---------------+----------+-------------+----------+--------+ OUTFLOW VEIN   PSV (cm/s)Diameter (cm)Depth (cm)Describe +---------------+----------+-------------+----------+--------+ Subclavian vein    37                                    +---------------+----------+-------------+----------+--------+ Confluence        278                                    +---------------+----------+-------------+----------+--------+ Shoulder          128                                    +---------------+----------+-------------+----------+--------+ Prox UA            69                                    +---------------+----------+-------------+----------+--------+ Mid UA            129                                    +---------------+----------+-------------+----------+--------+  Dist UA           372                                    +---------------+----------+-------------+----------+--------+  +--------------+-------------+---------+---------+----------+------------------+               Diameter (cm)  Depth  BranchingPSV  (cm/s)   Flow Volume                                  (cm)                           (ml/min)      +--------------+-------------+---------+---------+----------+------------------+ Lt Rad Art                                       41                       Dist                                                                      +--------------+-------------+---------+---------+----------+------------------+  Summary: The Left Brachial Cephalic AVF appears to be patent throughout; Flow Volume appears to be Normal.  *See table(s) above for measurements and observations.  Diagnosing physician: Hortencia Pilar MD Electronically signed by Hortencia Pilar MD on 06/28/2020 at 5:14:20 PM.   --------------------------------------------------------------------------------   Final      Assessment/Plan 1. End stage renal disease ([place]) Recommend:  The patient is doing well and currently has adequate dialysis access. The patient's dialysis center is not reporting any access issues. Flow pattern is stable when compared to the prior ultrasound.  The patient should have a duplex ultrasound of the dialysis access in 12 months.  The patient will follow-up with me in the office after each ultrasound     2. Benign essential HTN Continue antihypertensive medications as already ordered, these medications have been reviewed and there are no changes at this time.   3. Coronary artery disease of native artery of native heart with stable angina pectoris (HCC) Continue cardiac and antihypertensive medications as already ordered and reviewed, no changes at this time.  Continue statin as ordered and reviewed, no changes at this time  Nitrates PRN for chest pain     Hortencia Pilar, MD  07/02/2020 1:26 PM

## 2020-07-19 ENCOUNTER — Encounter: Payer: Self-pay | Admitting: Emergency Medicine

## 2020-07-19 ENCOUNTER — Emergency Department
Admission: EM | Admit: 2020-07-19 | Discharge: 2020-07-19 | Disposition: A | Discharge status: Home / Self Care | Payer: Medicare Other | Attending: Emergency Medicine | Admitting: Emergency Medicine

## 2020-07-19 ENCOUNTER — Other Ambulatory Visit: Payer: Self-pay

## 2020-07-19 DIAGNOSIS — Z8546 Personal history of malignant neoplasm of prostate: Secondary | ICD-10-CM | POA: Diagnosis not present

## 2020-07-19 DIAGNOSIS — Z992 Dependence on renal dialysis: Secondary | ICD-10-CM | POA: Insufficient documentation

## 2020-07-19 DIAGNOSIS — I2510 Atherosclerotic heart disease of native coronary artery without angina pectoris: Secondary | ICD-10-CM | POA: Insufficient documentation

## 2020-07-19 DIAGNOSIS — J449 Chronic obstructive pulmonary disease, unspecified: Secondary | ICD-10-CM | POA: Diagnosis not present

## 2020-07-19 DIAGNOSIS — Z7951 Long term (current) use of inhaled steroids: Secondary | ICD-10-CM | POA: Diagnosis not present

## 2020-07-19 DIAGNOSIS — I120 Hypertensive chronic kidney disease with stage 5 chronic kidney disease or end stage renal disease: Secondary | ICD-10-CM | POA: Insufficient documentation

## 2020-07-19 DIAGNOSIS — R0981 Nasal congestion: Secondary | ICD-10-CM | POA: Diagnosis not present

## 2020-07-19 DIAGNOSIS — N186 End stage renal disease: Secondary | ICD-10-CM | POA: Insufficient documentation

## 2020-07-19 DIAGNOSIS — F17210 Nicotine dependence, cigarettes, uncomplicated: Secondary | ICD-10-CM | POA: Insufficient documentation

## 2020-07-19 DIAGNOSIS — R519 Headache, unspecified: Secondary | ICD-10-CM | POA: Insufficient documentation

## 2020-07-19 NOTE — Discharge Instructions (Addendum)
Please use phenylephrine or pseudoephedrine for any continued sinus congestion as ibuprofen/tylenol for any continued headache

## 2020-07-19 NOTE — ED Triage Notes (Signed)
Patient ambulatory to triage with steady gait, without difficulty or distress noted; pt reports rt sided HA since yesterday with sinus drainage; took percocet PTA without relief

## 2020-07-19 NOTE — ED Notes (Addendum)
Pt advised he has been to the ER multiple times in the last few months trying to figure out his headache. He feels like its a bad sinus infection. He has had antibiotics but doesn't feel like it cleared it up completely. Pt took a percocet at home before he come to the ER>

## 2020-07-19 NOTE — ED Provider Notes (Signed)
[facility] Emergency Department Provider Note   ____________________________________________   Event Date/Time   First MD Initiated Contact with Patient 07/19/20 [phone]     (approximate)  I have reviewed the triage vital signs and the nursing notes.   HISTORY  Chief Complaint Headache    HPI Nathaniel [name] is a 65 y.o. male with the below stated past medical history who presents for right-sided headache  LOCATION: Right face and head DURATION: 24 hours TIMING: Worsening since onset SEVERITY: 7/10 QUALITY: Aching/throbbing/pressure CONTEXT: States has a history of sinus issues in the past and began feeling sinus pressure yesterday MODIFYING FACTORS: States Flonase has helped with the nasal congestion but still has frontal sinus headache ASSOCIATED SYMPTOMS: Sinus congestion, clear rhinorrhea   Per medical record review patient has a history of chronic sinus disease          Past Medical History:  Diagnosis Date   Anemia    Arthritis    Chronic back pain    Dr. Quay Burow manages (per pt)   Chronic kidney disease    COPD (chronic obstructive pulmonary disease) ([place])    Dyspnea    with exertion   GERD (gastroesophageal reflux disease)    h/o   Hearing loss    Hyperlipidemia    MGUS (monoclonal gammopathy of unknown significance)    Multiple myeloma ([place]) 2011   and prostate ca in 2019   Proteinuria    Tobacco abuse     Patient Active Problem List   Diagnosis Date Noted   Complication from renal dialysis device 06/21/2019   End stage renal disease ([place]) 06/20/2019   Coronary artery disease 12/19/2017   Prostate cancer ([place]) 10/19/2017   Special screening for malignant neoplasms, colon    Benign neoplasm of transverse colon    Benign neoplasm of sigmoid colon    Rectal polyp    Chronic obstructive pulmonary disease ([place]) 06/28/2014   Occult blood positive stool 06/19/2014   Acute chest pain 01/02/2014   Benign essential HTN  01/02/2014   Combined fat and carbohydrate induced hyperlipemia 01/02/2014   Breathlessness on exertion 01/02/2014   Current tobacco use 01/02/2014   Focal and segmental hyalinosis 12/17/2012   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   Local superficial swelling, mass or lump 05/26/2012   High potassium 05/24/2012   Lipoma of skin and subcutaneous tissue (excluding face) 05/24/2012   Multiple myeloma ([place]) 05/24/2012   HLD (hyperlipidemia) 05/24/2012   Age-associated hearing loss 05/24/2012   Chronic rhinitis 11/12/2011   Chronic infection of sinus 11/12/2011   Abnormal presence of protein in urine 07/13/2010   Dermatitis, eczematoid 07/03/2010   Disease of sebaceous glands 07/03/2010    Past Surgical History:  Procedure Laterality Date   A/V FISTULAGRAM Left 01/18/2019   Procedure: A/V FISTULAGRAM;  Surgeon: Katha Cabal, MD;  Location: [facility];  Service: Cardiovascular;  Laterality: Left;   AV FISTULA PLACEMENT Left 10/29/2018   Procedure: ARTERIOVENOUS (AV) FISTULA CREATION ( BRACHIAL CEPHALIC);  Surgeon: Katha Cabal, MD;  Location: [facility];  Service: Vascular;  Laterality: Left;   COLONOSCOPY WITH PROPOFOL N/A 07/04/2014   Procedure: COLONOSCOPY WITH PROPOFOL;  Surgeon: Lucilla Lame, MD;  Location: [facility] END[facility]COPY;  Service: Endoscopy;  Laterality: N/A;   TONSILLECTOMY     as child    Prior to Admission medications   Medication Sig Start Date End Date Taking? Authorizing Provider  albuterol (VENTOLIN HFA) 108 (90 Base) MCG/ACT inhaler INHALE 2 PUFFS  INTO THE LUNGS EVERY 6 HOURS AS NEEDED FOR WHEEZING OR SHORTNESS OF BREATH 08/23/19   Tyler Pita, MD  calcium acetate (PHOSLO) 667 MG capsule Take 667 mg by mouth 2 (two) times daily.    [provider]  Cholecalciferol (VITAMIN D3) 50 MCG (2000 UT) TABS Take 2,000 Units by mouth at bedtime.    [provider]  erythromycin ophthalmic ointment Place 1 application into the left  eye at bedtime. 06/04/20   Blake Divine, MD  fluticasone (FLONASE) 50 MCG/ACT nasal spray Place 2 sprays into both nostrils daily as needed for allergies.  01/25/15   [provider]  Fluticasone-Umeclidin-Vilant (TRELEGY ELLIPTA) 100-62.5-25 MCG/INH AEPB Inhale 1 puff into the lungs daily. 08/05/18   Tyler Pita, MD  furosemide (LASIX) 20 MG tablet Take 20 mg by mouth 2 (two) times daily. Morning & afternoon. 09/14/18 09/14/19  [provider]  HYDROcodone-acetaminophen (NORCO) 5-325 MG tablet Take 1-2 tablets by mouth every 6 (six) hours as needed for severe pain. Patient not taking: Reported on 06/28/2020 10/29/18   Schnier, Dolores Lory, MD  lidocaine-prilocaine (EMLA) cream Apply 1 application topically as needed (prior to access for dialysis.).    [provider]  Multiple Vitamin (MULTIVITAMIN WITH MINERALS) TABS tablet Take 1 tablet by mouth daily.    [provider]  multivitamin (RENA-VIT) TABS tablet Take 1 tablet by mouth daily. 12/19/19   [provider]  oxyCODONE-acetaminophen (PERCOCET/ROXICET) 5-325 MG tablet Take 1 tablet by mouth 4 (four) times daily as needed for moderate pain.     [provider]  pravastatin (PRAVACHOL) 40 MG tablet Take 40 mg by mouth at bedtime.    [provider]    Allergies No known allergies  Family History  Problem Relation Age of Onset   Heart disease Brother 11       MI   Colon cancer Neg Hx    Liver disease Neg Hx    Prostate cancer Neg Hx    Kidney cancer Neg Hx    Bladder Cancer Neg Hx     Social History Social History   Tobacco Use   Smoking status: Some Days    Packs/day: 2.00    Years: 50.00    Pack years: 100.00    Types: Cigarettes   Smokeless tobacco: Never  Vaping Use   Vaping Use: Never used  Substance Use Topics   Alcohol use: No    Alcohol/week: 0.0 standard drinks   Drug use: No    Review of Systems Constitutional: No fever/chills Eyes: No visual  changes. ENT: Endorses sinus congestion, rhinorrhea, and right frontal headache Cardiovascular: Denies chest pain. Respiratory: Denies shortness of breath. Gastrointestinal: No abdominal pain.  No nausea, no vomiting.  No diarrhea. Genitourinary: Negative for dysuria. Musculoskeletal: Negative for acute arthralgias Skin: Negative for rash. Neurological: Positive for headaches, negative for weakness/numbness/paresthesias in any extremity Psychiatric: Negative for suicidal ideation/homicidal ideation   ____________________________________________   PHYSICAL EXAM:  VITAL SIGNS: ED Triage Vitals  Enc Vitals Group     BP 07/19/20 0649 114/73     Pulse Rate 07/19/20 0649 92     Resp 07/19/20 0649 18     Temp 07/19/20 0649 98.2 F (36.8 C)     Temp Source 07/19/20 0649 Oral     SpO2 07/19/20 0649 97 %     Weight 07/19/20 0647 235 lb (106.6 kg)     Height 07/19/20 0647 6' (1.829 m)     Head Circumference --  Peak Flow --      Pain Score 07/19/20 0647 7     Pain Loc --      Pain Edu? --      Excl. in [place]? --    Constitutional: Alert and oriented. Well appearing and in no acute distress. Eyes: Conjunctivae are normal. PERRL. Head: Atraumatic. Nose: Congestion/clear rhinorrhea.  Erythematous nares bilaterally Mouth/Throat: Mucous membranes are moist. Neck: No stridor Cardiovascular: Grossly normal heart sounds.  Good peripheral circulation. Respiratory: Normal respiratory effort.  No retractions. Gastrointestinal: Soft and nontender. No distention. Musculoskeletal: No obvious deformities Neurologic:  Normal speech and language. No gross focal neurologic deficits are appreciated. Skin:  Skin is warm and dry. No rash noted. Psychiatric: Mood and affect are normal. Speech and behavior are normal.  ____________________________________________   LABS (all labs ordered are listed, but only abnormal results are displayed)  Labs Reviewed - No data to  display    PROCEDURES  Procedure(s) performed (including Critical Care):  .1-3 Lead EKG Interpretation  Date/Time: 07/19/2020 9:50 AM Performed by: Naaman Plummer, MD Authorized by: Naaman Plummer, MD     Interpretation: normal     ECG rate:  82   ECG rate assessment: normal     Rhythm: sinus rhythm     Ectopy: none     Conduction: normal     ____________________________________________   INITIAL IMPRESSION / ASSESSMENT AND PLAN / ED COURSE  As part of my medical decision making, I reviewed the following data within the electronic medical record, if available:  Nursing notes reviewed and incorporated, Labs reviewed, EKG interpreted, Old chart reviewed, Radiograph reviewed and Notes from prior ED visits reviewed and incorporated      MDM:  Vital signs revealed no major abnormalities, no fever, no tachycardia, no tachypnea, no hypotension,  non toxic appearing    DDx considered: Sinus infection These were thought to be more likely.    Strep throat, Tonsilitis, hand-foot-and-mouth syndrome, peritonsillar/retropharyngeal abscess  These were thought to be less likely given the history, exam, and workup.   Rest, fluids, and over-the-counter symptomatic treatments were recommended.  Patient advised to follow up with primary care in three to five days, Call the clinic or urgent care with new or worsening symptoms,  present to ER with new or worsening symptoms which discussed during the visit.      ____________________________________________   FINAL CLINICAL IMPRESSION(S) / ED DIAGNOSES  Final diagnoses:  Sinus headache  Sinus congestion     ED Discharge Orders     None        Note:  This document was prepared using Dragon voice recognition software and may include unintentional dictation errors.    Naaman Plummer, MD 07/19/20 [phone]

## 2020-11-02 ENCOUNTER — Other Ambulatory Visit: Payer: Self-pay | Admitting: Orthopedic Surgery

## 2020-11-02 ENCOUNTER — Other Ambulatory Visit (HOSPITAL_BASED_OUTPATIENT_CLINIC_OR_DEPARTMENT_OTHER): Payer: Self-pay | Admitting: Orthopedic Surgery

## 2020-11-02 DIAGNOSIS — M5030 Other cervical disc degeneration, unspecified cervical region: Secondary | ICD-10-CM

## 2020-11-02 DIAGNOSIS — M25311 Other instability, right shoulder: Secondary | ICD-10-CM

## 2020-11-02 DIAGNOSIS — M5412 Radiculopathy, cervical region: Secondary | ICD-10-CM

## 2020-11-02 DIAGNOSIS — M7551 Bursitis of right shoulder: Secondary | ICD-10-CM

## 2020-11-02 DIAGNOSIS — M4802 Spinal stenosis, cervical region: Secondary | ICD-10-CM

## 2020-11-02 DIAGNOSIS — M25511 Pain in right shoulder: Secondary | ICD-10-CM

## 2020-11-02 DIAGNOSIS — G8929 Other chronic pain: Secondary | ICD-10-CM

## 2021-06-09 ENCOUNTER — Other Ambulatory Visit: Payer: Self-pay

## 2021-06-09 ENCOUNTER — Encounter: Payer: Self-pay | Admitting: Emergency Medicine

## 2021-06-09 ENCOUNTER — Emergency Department: Payer: Medicare Other

## 2021-06-09 ENCOUNTER — Emergency Department
Admission: EM | Admit: 2021-06-09 | Discharge: 2021-06-09 | Disposition: A | Discharge status: Home / Self Care | Payer: Medicare Other | Attending: Emergency Medicine | Admitting: Emergency Medicine

## 2021-06-09 DIAGNOSIS — J189 Pneumonia, unspecified organism: Secondary | ICD-10-CM | POA: Diagnosis not present

## 2021-06-09 DIAGNOSIS — R059 Cough, unspecified: Secondary | ICD-10-CM | POA: Diagnosis present

## 2021-06-09 MED ORDER — LEVOFLOXACIN 500 MG PO TABS: 500.0000 mg | ORAL_TABLET | Freq: Every day | ORAL | 0 refills | Status: AC

## 2021-06-09 MED ORDER — ALBUTEROL SULFATE HFA 108 (90 BASE) MCG/ACT IN AERS: 2.0000 | INHALATION_SPRAY | Freq: Four times a day (QID) | RESPIRATORY_TRACT | 0 refills | Status: DC | PRN

## 2021-06-09 NOTE — Discharge Instructions (Addendum)
Follow-up with [facility] clinic pulmonology, please call for an appointment.  Tell them you were seen in the emergency department and diagnosed with pneumonia.  Tell them they are concerned that if the pneumonia does not clear that it could be a malignancy.  You need a repeat chest x-ray in 3 to 4 weeks. Use the antibiotic and your inhalers as prescribed.  Return emergency department if you are worsening

## 2021-06-09 NOTE — ED Triage Notes (Signed)
Pt reports productive cough, SOB and sinus pain and pressure for the past couple of weeks. Pt states usually gets this and is seen at [facility] but they are closed today

## 2021-06-09 NOTE — ED Notes (Signed)
Pt c/o cough, congestions, and sinus pressure for a few weeks. States " I just need some antibiotics to get better." HX respiratory infections

## 2021-06-09 NOTE — ED Provider Notes (Signed)
[facility] Provider Note    Event Date/Time   First MD Initiated Contact with Patient 06/09/21 [phone]     (approximate)   History   Shortness of Breath, Facial Pain, and Cough   HPI  Nathaniel [name] is a 66 y.o. male with history of pulmonary disease with only 29% lung function on his last visit to the pulmonologist, presents to the emergency department complaining of cough and congestion with mild shortness of breath.  States he no longer smokes as it makes him have difficulty breathing.  His current symptoms have been ongoing for 2 weeks and feels that he is worsening.  No swelling in his extremities.  No fever or chills.      Physical Exam   Triage Vital Signs: ED Triage Vitals  Enc Vitals Group     BP 06/09/21 0940 (!) 169/80     Pulse Rate 06/09/21 0940 84     Resp 06/09/21 0940 (!) 24     Temp 06/09/21 0940 97.8 F (36.6 C)     Temp Source 06/09/21 0940 Oral     SpO2 06/09/21 0940 98 %     Weight 06/09/21 0935 235 lb (106.6 kg)     Height 06/09/21 0935 6' (1.829 m)     Head Circumference --      Peak Flow --      Pain Score 06/09/21 0935 5     Pain Loc --      Pain Edu? --      Excl. in [place]? --     Most recent vital signs: Vitals:   06/09/21 0940 06/09/21 1024  BP: (!) 169/80 136/69  Pulse: 84 81  Resp: (!) 24 (!) 22  Temp: 97.8 F (36.6 C)   SpO2: 98% 98%     General: Awake, no distress.   CV:  Good peripheral perfusion. regular rate and  rhythm Resp:  Normal effort. Lungs seen today Abd:  No distention.   Other:      ED Results / Procedures / Treatments   Labs (all labs ordered are listed, but only abnormal results are displayed) Labs Reviewed - No data to display   EKG     RADIOLOGY Chest x-ray    PROCEDURES:   Procedures   MEDICATIONS ORDERED IN ED: Medications - No data to display   IMPRESSION / MDM / [place] / ED COURSE  I reviewed the triage vital signs and the nursing notes.                               Differential diagnosis includes, but is not limited to, CAP, COPD, COPD exasperation, respiratory distress  Patient's presentation is most consistent with exacerbation of chronic illness.   The patient's lungs are clear to auscultation his vitals are normal.  Chest x-ray was interpreted by me as having a right upper lobe pneumonia.  Radiologist is concerned that the patient needs a repeat chest x-ray in 3 to 4 weeks to ensure this is not a mass.  I did discuss these findings with the patient.  He is to follow-up with his regular doctor/Dr. Raul Del at [facility].  If he is worsening he is to return the emergency department.  He was given a refill on his rescue inhaler, albuterol.  He was also given a prescription for Levaquin 500 mg for 7 days.  Patient is in agreement with his treatment  plan.  He is to avoid heat and humidity.  He is to avoid smoking.  Patient is in agreement.  Discharged in stable condition.       FINAL CLINICAL IMPRESSION(S) / ED DIAGNOSES   Final diagnoses:  Community acquired pneumonia of right upper lobe of lung     Rx / DC Orders   ED Discharge Orders          Ordered    albuterol (VENTOLIN HFA) 108 (90 Base) MCG/ACT inhaler  Every 6 hours PRN       Note to Pharmacy: PT NEEDS APPT FOR FURTHER REFILLS   06/09/21 1024    levofloxacin (LEVAQUIN) 500 MG tablet  Daily        06/09/21 1024             Note:  This document was prepared using Dragon voice recognition software and may include unintentional dictation errors.    Versie Starks, PA-C 06/09/21 1029    Duffy Bruce, MD 06/12/21 [phone]

## 2021-06-27 ENCOUNTER — Ambulatory Visit (INDEPENDENT_AMBULATORY_CARE_PROVIDER_SITE_OTHER): Payer: Medicare Other | Admitting: Vascular Surgery

## 2021-06-27 ENCOUNTER — Ambulatory Visit (INDEPENDENT_AMBULATORY_CARE_PROVIDER_SITE_OTHER): Payer: Medicare Other

## 2021-06-27 ENCOUNTER — Encounter (INDEPENDENT_AMBULATORY_CARE_PROVIDER_SITE_OTHER): Payer: Self-pay | Admitting: Vascular Surgery

## 2021-06-27 VITALS — BP 103/77 | HR 92 | Resp 16 | Wt 238.0 lb

## 2021-06-27 DIAGNOSIS — N186 End stage renal disease: Secondary | ICD-10-CM

## 2021-06-27 DIAGNOSIS — I25118 Atherosclerotic heart disease of native coronary artery with other forms of angina pectoris: Secondary | ICD-10-CM

## 2021-06-27 DIAGNOSIS — I10 Essential (primary) hypertension: Secondary | ICD-10-CM

## 2021-06-27 DIAGNOSIS — J449 Chronic obstructive pulmonary disease, unspecified: Secondary | ICD-10-CM | POA: Diagnosis not present

## 2021-06-27 DIAGNOSIS — E782 Mixed hyperlipidemia: Secondary | ICD-10-CM

## 2021-06-30 ENCOUNTER — Encounter (INDEPENDENT_AMBULATORY_CARE_PROVIDER_SITE_OTHER): Payer: Self-pay | Admitting: Vascular Surgery

## 2021-07-11 ENCOUNTER — Ambulatory Visit (INDEPENDENT_AMBULATORY_CARE_PROVIDER_SITE_OTHER): Payer: Medicare Other | Admitting: Urology

## 2021-07-11 ENCOUNTER — Telehealth: Payer: Self-pay

## 2021-07-11 ENCOUNTER — Encounter: Payer: Self-pay | Admitting: Urology

## 2021-07-11 VITALS — BP 131/71 | HR 78 | Ht 72.0 in | Wt 239.0 lb

## 2021-07-11 DIAGNOSIS — Z8546 Personal history of malignant neoplasm of prostate: Secondary | ICD-10-CM

## 2021-07-11 DIAGNOSIS — N529 Male erectile dysfunction, unspecified: Secondary | ICD-10-CM

## 2021-07-11 DIAGNOSIS — C61 Malignant neoplasm of prostate: Secondary | ICD-10-CM

## 2021-07-11 NOTE — Telephone Encounter (Signed)
CALLED PATIENT NO ANSWER LEFT VOICEMAIL FOR A CALL BACK ? ?

## 2021-07-11 NOTE — Progress Notes (Signed)
   07/11/2021 4:21 PM   Olaoluwa Grieder [birth year] [phone]  Reason for visit: Follow up prostate cancer, ED  HPI: 66 year old male with a number of comorbidities including COPD and new dialysis over the last 1 to 2 years who has a history of prostate cancer and had been lost to follow-up.  He was diagnosed with favorable intermediate risk prostate cancer and a PSA of 9 in February 2019 and ultimately underwent radiation with Dr. Baruch Gouty completed in March 2020.  I do not see that he had 6 months of adjuvant ADT.  PSA was last checked in July 2020 and it decreased to 2.47.  He reports worsening ED since the radiation, and is not able to achieve erection sufficient for intercourse despite 100 mg Viagra on demand.  We offered a trial of Cialis but he deferred.  It sounds like he had some problems with diarrhea and constipation after radiation as well.  He is on dialysis but continues to make urine and has some frequency when he takes his Lasix.  He denies any dysuria or gross hematuria.  We discussed the importance of continuing to follow the PSA after prostate cancer treatment, and he was amenable to a PSA today.  If stable can continue yearly PSA monitoring.  PSA today, call with results  Billey Co, MD  [facility] [address], [place], [place] [address] [phone]

## 2021-07-12 ENCOUNTER — Telehealth: Payer: Self-pay

## 2021-07-12 DIAGNOSIS — C61 Malignant neoplasm of prostate: Secondary | ICD-10-CM

## 2021-07-12 LAB — PSA: Prostate Specific Ag, Serum: 0.7 ng/mL (ref 0.0–4.0)

## 2021-07-12 NOTE — Telephone Encounter (Signed)
Called pt no answer, LM per DPR. Advised pt to call back for questions or concerns. Appt scheduled.

## 2021-07-12 NOTE — Telephone Encounter (Signed)
-----   Message from Billey Co, MD sent at 07/12/2021 11:03 AM EDT ----- Doristine Devoid news, PSA very low at 0.7 indicating prostate cancer treated.  Recommend 1 year follow-up with PSA prior  Nickolas Madrid, MD 07/12/2021

## 2021-07-16 ENCOUNTER — Encounter: Payer: Self-pay | Admitting: Family Medicine

## 2021-07-16 ENCOUNTER — Other Ambulatory Visit: Payer: Self-pay

## 2021-07-16 DIAGNOSIS — Z86010 Personal history of colonic polyps: Secondary | ICD-10-CM

## 2021-07-16 MED ORDER — NA SULFATE-K SULFATE-MG SULF 17.5-3.13-1.6 GM/177ML PO SOLN: 1.0000 | Freq: Once | ORAL | 0 refills | Status: AC

## 2021-07-16 NOTE — Progress Notes (Signed)
Gastroenterology Pre-Procedure Review  Request Date: 12/10/2021 Requesting Physician: Dr. Allen Norris  PATIENT REVIEW QUESTIONS: The patient responded to the following health history questions as indicated:    1. Are you having any GI issues? no 2. Do you have a personal history of Polyps? yes (5 years ago) 3. Do you have a family history of Colon Cancer or Polyps? no 4. Diabetes Mellitus? no 5. Joint replacements in the past 12 months?no 6. Major health problems in the past 3 months?pnemonia 7. Any artificial heart valves, MVP, or defibrillator?no    MEDICATIONS & ALLERGIES:    Patient reports the following regarding taking any anticoagulation/antiplatelet therapy:   Plavix, Coumadin, Eliquis, Xarelto, Lovenox, Pradaxa, Brilinta, or Effient? no Aspirin? yes (81 mg)  Patient confirms/reports the following medications:  Current Outpatient Medications  Medication Sig Dispense Refill   albuterol (VENTOLIN HFA) 108 (90 Base) MCG/ACT inhaler Inhale 2 puffs into the lungs every 6 (six) hours as needed for wheezing or shortness of breath. 8.5 g 0   ASPIRIN LOW DOSE 81 MG tablet Take 81 mg by mouth daily.     calcium acetate (PHOSLO) 667 MG capsule Take 667 mg by mouth 2 (two) times daily.     Cholecalciferol (VITAMIN D3) 50 MCG (2000 UT) TABS Take 2,000 Units by mouth at bedtime.     fluticasone (FLONASE) 50 MCG/ACT nasal spray Place 2 sprays into both nostrils daily as needed for allergies.      Fluticasone-Umeclidin-Vilant (TRELEGY ELLIPTA) 100-62.5-25 MCG/INH AEPB Inhale 1 puff into the lungs daily. 1 each 11   lidocaine-prilocaine (EMLA) cream Apply 1 application topically as needed (prior to access for dialysis.).     Multiple Vitamin (MULTIVITAMIN WITH MINERALS) TABS tablet Take 1 tablet by mouth daily.     multivitamin (RENA-VIT) TABS tablet Take 1 tablet by mouth daily.     oxyCODONE-acetaminophen (PERCOCET/ROXICET) 5-325 MG tablet Take 1 tablet by mouth 4 (four) times daily as needed for  moderate pain.      rosuvastatin (CRESTOR) 10 MG tablet Take 10 mg by mouth daily.     sildenafil (VIAGRA) 100 MG tablet Take by mouth.     No current facility-administered medications for this visit.    Patient confirms/reports the following allergies:  Allergies  Allergen Reactions   No Known Allergies     No orders of the defined types were placed in this encounter.   AUTHORIZATION INFORMATION Primary Insurance: 1D#: Group #:  Secondary Insurance: 1D#: Group #:  SCHEDULE INFORMATION: Date: 12/10/2021 Time: Location:[facility]

## 2021-07-23 ENCOUNTER — Other Ambulatory Visit: Payer: Self-pay | Admitting: Specialist

## 2021-07-23 DIAGNOSIS — R058 Other specified cough: Secondary | ICD-10-CM

## 2021-07-24 ENCOUNTER — Inpatient Hospital Stay: Payer: Medicare Other | Attending: Oncology | Admitting: Oncology

## 2021-07-24 ENCOUNTER — Encounter: Payer: Self-pay | Admitting: Oncology

## 2021-07-24 ENCOUNTER — Inpatient Hospital Stay: Payer: Medicare Other

## 2021-07-24 VITALS — Resp 20 | Ht 72.0 in | Wt 240.7 lb

## 2021-07-24 DIAGNOSIS — Z992 Dependence on renal dialysis: Secondary | ICD-10-CM | POA: Diagnosis not present

## 2021-07-24 DIAGNOSIS — D472 Monoclonal gammopathy: Secondary | ICD-10-CM | POA: Diagnosis present

## 2021-07-24 DIAGNOSIS — N186 End stage renal disease: Secondary | ICD-10-CM | POA: Diagnosis not present

## 2021-07-24 DIAGNOSIS — J449 Chronic obstructive pulmonary disease, unspecified: Secondary | ICD-10-CM | POA: Insufficient documentation

## 2021-07-24 DIAGNOSIS — Z79899 Other long term (current) drug therapy: Secondary | ICD-10-CM | POA: Insufficient documentation

## 2021-07-24 DIAGNOSIS — F17210 Nicotine dependence, cigarettes, uncomplicated: Secondary | ICD-10-CM | POA: Insufficient documentation

## 2021-07-24 LAB — COMPREHENSIVE METABOLIC PANEL
ALT: 63 U/L — ABNORMAL HIGH (ref 0–44)
AST: 55 U/L — ABNORMAL HIGH (ref 15–41)
Albumin: 3.3 g/dL — ABNORMAL LOW (ref 3.5–5.0)
Alkaline Phosphatase: 141 U/L — ABNORMAL HIGH (ref 38–126)
Anion gap: 4 — ABNORMAL LOW (ref 5–15)
BUN: 25 mg/dL — ABNORMAL HIGH (ref 8–23)
CO2: 32 mmol/L (ref 22–32)
Calcium: 7.7 mg/dL — ABNORMAL LOW (ref 8.9–10.3)
Chloride: 103 mmol/L (ref 98–111)
Creatinine, Ser: 3.39 mg/dL — ABNORMAL HIGH (ref 0.61–1.24)
GFR, Estimated: 19 mL/min — ABNORMAL LOW (ref >60)
Glucose, Bld: 92 mg/dL (ref 70–99)
Potassium: 4.5 mmol/L (ref 3.5–5.1)
Sodium: 139 mmol/L (ref 135–145)
Total Bilirubin: 0.7 mg/dL (ref 0.3–1.2)
Total Protein: 8.6 g/dL — ABNORMAL HIGH (ref 6.5–8.1)

## 2021-07-24 LAB — CBC WITH DIFFERENTIAL/PLATELET
Abs Immature Granulocytes: 0.04 10*3/uL (ref 0.00–0.07)
Basophils Absolute: 0 10*3/uL (ref 0.0–0.1)
Basophils Relative: 0 %
Eosinophils Absolute: 0.1 10*3/uL (ref 0.0–0.5)
Eosinophils Relative: 1 %
HCT: 36.6 % — ABNORMAL LOW (ref 39.0–52.0)
Hemoglobin: 11.9 g/dL — ABNORMAL LOW (ref 13.0–17.0)
Immature Granulocytes: 1 %
Lymphocytes Relative: 14 %
Lymphs Abs: 0.7 10*3/uL (ref 0.7–4.0)
MCH: 34.3 pg — ABNORMAL HIGH (ref 26.0–34.0)
MCHC: 32.5 g/dL (ref 30.0–36.0)
MCV: 105.5 fL — ABNORMAL HIGH (ref 80.0–100.0)
Monocytes Absolute: 0.4 10*3/uL (ref 0.1–1.0)
Monocytes Relative: 7 %
Neutro Abs: 3.7 10*3/uL (ref 1.7–7.7)
Neutrophils Relative %: 77 %
Platelets: 108 10*3/uL — ABNORMAL LOW (ref 150–400)
RBC: 3.47 MIL/uL — ABNORMAL LOW (ref 4.22–5.81)
RDW: 15.9 % — ABNORMAL HIGH (ref 11.5–15.5)
WBC: 4.9 10*3/uL (ref 4.0–10.5)
nRBC: 0 % (ref 0.0–0.2)

## 2021-07-24 NOTE — Progress Notes (Signed)
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  Hemoglobin at that point was 16.3 with a creatinine of 1.24 calcium of 9.4 and an albumin of 4.  LDH elevated at 640.  IgG 2840 and SPEP showed IgG kappa M protein of 2 g.  Free light chain ratio elevated at 4.4 with serum free light chain 11.6.  24-hour urine protein electrophoresis showed 5.6 g of protein MRI and skeletal survey showed no evidence of active myeloma.  Bone marrow biopsy in May 2012 showed 12% plasma cell involvement by aspiration differential.  5 to 10% plasma cells by CD138 IHC.  No amyloidosis on Congo red stains.  Cytogenetics normal.  FISH normal.  Abdominal fat pad biopsy 2012 negative for amyloidosis.  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  No evidence of amyloidosis.  Last seen by [facility] in 2021 and expected management was continued.  Most recent IgG from January 2021 was 3442.  ECOG PS- 1  Pain scale- 0   Review of systems- Review of Systems  Constitutional:  Positive for malaise/fatigue. Negative for chills, fever and weight loss.  HENT:  Negative for congestion, ear discharge and nosebleeds.   Eyes:  Negative for blurred vision.  Respiratory:  Negative for cough,  hemoptysis, sputum production, shortness of breath and wheezing.   Cardiovascular:  Negative for chest pain, palpitations, orthopnea and claudication.  Gastrointestinal:  Negative for abdominal pain, blood in stool, constipation, diarrhea, heartburn, melena, nausea and vomiting.  Genitourinary:  Negative for dysuria, flank pain, frequency, hematuria and urgency.  Musculoskeletal:  Negative for back pain, joint pain and myalgias.  Skin:  Negative for rash.  Neurological:  Negative for dizziness, tingling, focal weakness, seizures, weakness and headaches.  Endo/Heme/Allergies:  Does not bruise/bleed easily.  Psychiatric/Behavioral:  Negative for depression and suicidal ideas. The patient does not have insomnia.     Allergies  Allergen Reactions   No Known Allergies     Patient Active Problem List   Diagnosis Date Noted   Complication from renal dialysis device 06/21/2019   End stage renal disease ([place]) 06/20/2019   Coronary artery disease 12/19/2017   Prostate cancer ([place]) 10/19/2017   Special screening for malignant neoplasms, colon    Benign neoplasm of transverse colon    Benign neoplasm of sigmoid colon    Rectal polyp    Chronic obstructive pulmonary disease ([place]) 06/28/2014   Occult blood positive stool 06/19/2014   Acute chest pain 01/02/2014   Benign essential HTN 01/02/2014   Combined fat and carbohydrate induced hyperlipemia 01/02/2014   Breathlessness on exertion 01/02/2014   Current tobacco use 01/02/2014   Focal and segmental hyalinosis 12/17/2012   Smoldering multiple myeloma 07/01/2012   Local superficial swelling, mass or lump 05/26/2012   High potassium 05/24/2012   Lipoma of skin and subcutaneous tissue (excluding face) 05/24/2012   Multiple myeloma ([place])  05/24/2012   HLD (hyperlipidemia) 05/24/2012   Age-associated hearing loss 05/24/2012   Chronic rhinitis 11/12/2011   Chronic infection of sinus 11/12/2011   Abnormal presence of protein in urine 07/13/2010    Dermatitis, eczematoid 07/03/2010   Disease of sebaceous glands 07/03/2010     Past Medical History:  Diagnosis Date   Anemia    Arthritis    Chronic back pain    Dr. Quay Burow manages (per pt)   Chronic kidney disease    COPD (chronic obstructive pulmonary disease) ([place])    Dyspnea    with exertion   GERD (gastroesophageal reflux disease)    h/o   Hearing loss    Hyperlipidemia    MGUS (monoclonal gammopathy of unknown significance)    Multiple myeloma ([place]) 2011   and prostate ca in 2019   Proteinuria    Tobacco abuse      Past Surgical History:  Procedure Laterality Date   A/V FISTULAGRAM Left 01/18/2019   Procedure: A/V FISTULAGRAM;  Surgeon: Katha Cabal, MD;  Location: [facility];  Service: Cardiovascular;  Laterality: Left;   AV FISTULA PLACEMENT Left 10/29/2018   Procedure: ARTERIOVENOUS (AV) FISTULA CREATION ( BRACHIAL CEPHALIC);  Surgeon: Katha Cabal, MD;  Location: [facility];  Service: Vascular;  Laterality: Left;   COLONOSCOPY WITH PROPOFOL N/A 07/04/2014   Procedure: COLONOSCOPY WITH PROPOFOL;  Surgeon: Lucilla Lame, MD;  Location: [facility];  Service: Endoscopy;  Laterality: N/A;   TONSILLECTOMY     as child    Social History   Socioeconomic History   Marital status: Married    Spouse name: Not on file   Number of children: 2   Years of education: Not on file   Highest education level: Not on file  Occupational History   Not on file  Tobacco Use   Smoking status: Some Days    Packs/day: 2.00    Years: 50.00    Total pack years: 100.00    Types: Cigarettes    Passive exposure: Current   Smokeless tobacco: Never  Vaping Use   Vaping Use: Never used  Substance and Sexual Activity   Alcohol use: No    Alcohol/week: 0.0 standard drinks of alcohol   Drug use: No   Sexual activity: Yes  Other Topics Concern   Not on file  Social History Narrative   Disabled, 2 sons-healthy   Social Determinants of Health   Financial  Resource Strain: Not on file  Food Insecurity: Not on file  Transportation Needs: Not on file  Physical Activity: Not on file  Stress: Not on file  Social Connections: Not on file  Intimate Partner Violence: Not on file     Family History  Problem Relation Age of Onset   Heart disease Brother 54       MI   Colon cancer Neg Hx    Liver disease Neg Hx    Prostate cancer Neg Hx    Kidney cancer Neg Hx    Bladder Cancer Neg Hx      Current Outpatient Medications:    ASPIRIN LOW DOSE 81 MG tablet, Take 81 mg by mouth daily., Disp: , Rfl:    calcium acetate (PHOSLO) 667 MG capsule, Take 667 mg by mouth 2 (two) times daily., Disp: , Rfl:    Cholecalciferol (VITAMIN D3) 50 MCG (2000 UT) TABS, Take 2,000 Units by mouth at bedtime., Disp: , Rfl:    oxyCODONE-acetaminophen (PERCOCET/ROXICET) 5-325 MG tablet, Take 1 tablet by  mouth 4 (four) times daily as needed for moderate pain. , Disp: , Rfl:    predniSONE (DELTASONE) 10 MG tablet, Prednisone 10 mg, 4 pills q day x 2 days, 3 pills q day x 2 days, 2 pills q day x 2 days, 1 pill q day x 2 days., Disp: , Rfl:    rosuvastatin (CRESTOR) 10 MG tablet, Take 10 mg by mouth daily., Disp: , Rfl:    sildenafil (VIAGRA) 100 MG tablet, Take by mouth., Disp: , Rfl:    albuterol (VENTOLIN HFA) 108 (90 Base) MCG/ACT inhaler, Inhale 2 puffs into the lungs every 6 (six) hours as needed for wheezing or shortness of breath., Disp: 8.5 g, Rfl: 0   fluticasone (FLONASE) 50 MCG/ACT nasal spray, Place 2 sprays into both nostrils daily as needed for allergies.  (Patient not taking: Reported on 07/24/2021), Disp: , Rfl:    Fluticasone-Umeclidin-Vilant (TRELEGY ELLIPTA) 100-62.5-25 MCG/INH AEPB, Inhale 1 puff into the lungs daily. (Patient not taking: Reported on 07/24/2021), Disp: 1 each, Rfl: 11   lidocaine-prilocaine (EMLA) cream, Apply 1 application topically as needed (prior to access for dialysis.)., Disp: , Rfl:    Multiple Vitamin (MULTIVITAMIN WITH MINERALS)  TABS tablet, Take 1 tablet by mouth daily. (Patient not taking: Reported on 07/24/2021), Disp: , Rfl:    multivitamin (RENA-VIT) TABS tablet, Take 1 tablet by mouth daily. (Patient not taking: Reported on 07/24/2021), Disp: , Rfl:    Physical exam:  Vitals:   07/24/21 1448  Resp: 20  Weight: 240 lb 11.2 oz (109.2 kg)  Height: 6' (1.829 m)   Physical Exam Constitutional:      General: He is not in acute distress. Cardiovascular:     Rate and Rhythm: Normal rate and regular rhythm.     Heart sounds: Normal heart sounds.  Pulmonary:     Effort: Pulmonary effort is normal.     Breath sounds: Normal breath sounds.  Abdominal:     General: Bowel sounds are normal.     Palpations: Abdomen is soft.  Skin:    General: Skin is warm and dry.  Neurological:     Mental Status: He is alert and oriented to person, place, and time.           Latest Ref Rng & Units 06/04/2020    3:08 PM  CMP  Glucose 70 - 99 mg/dL 94   BUN 8 - 23 mg/dL 23   Creatinine 0.61 - 1.24 mg/dL 2.73   Sodium 135 - 145 mmol/L 133   Potassium 3.5 - 5.1 mmol/L 3.3   Chloride 98 - 111 mmol/L 96   CO2 22 - 32 mmol/L 27   Calcium 8.9 - 10.3 mg/dL 8.1   Total Protein 6.5 - 8.1 g/dL 9.1   Total Bilirubin 0.3 - 1.2 mg/dL 0.5   Alkaline Phos 38 - 126 U/L 220   AST 15 - 41 U/L 44   ALT 0 - 44 U/L 47       Latest Ref Rng & Units 06/04/2020    3:08 PM  CBC  WBC 4.0 - 10.5 K/uL 5.4   Hemoglobin 13.0 - 17.0 g/dL 13.0   Hematocrit 39.0 - 52.0 % 38.1   Platelets 150 - 400 K/uL 150     No images are attached to the encounter.  VAS US DUPLEX DIALYSIS ACCESS (AVF, AVG)  Result Date: 06/27/2021 DIALYSIS ACCESS Patient Name:  TIRRELL BUCHBERGER  Date of Exam:   06/27/2021 Medical Rec #: [id number]  Accession #:    [id number] Date of Birth: 1955-08-16        Patient Gender: M Patient Age:   77 years Exam Location:  [facility] Procedure:      VAS US DUPLEX DIALYSIS ACCESS (AVF, AVG) Referring Phys: Hortencia Pilar --------------------------------------------------------------------------------  Access Site: Left Upper Extremity. Access Type: Brachial-cephalic AVF. Comparison Study: 06/28/2020 Performing Technologist: Almira Coaster RVS  Examination Guidelines: A complete evaluation includes B-mode imaging, spectral Doppler, color Doppler, and power Doppler as needed of all accessible portions of each vessel. Unilateral testing is considered an integral part of a complete examination. Limited examinations for reoccurring indications may be performed as noted.  Findings: +--------------------+----------+-----------------+--------+ AVF                 PSV (cm/s)Flow Vol (mL/min)Comments +--------------------+----------+-----------------+--------+ Native artery inflow   137          2604                +--------------------+----------+-----------------+--------+ AVF Anastomosis        510                              +--------------------+----------+-----------------+--------+  +---------------+----------+-------------+----------+--------+ OUTFLOW VEIN   PSV (cm/s)Diameter (cm)Depth (cm)Describe +---------------+----------+-------------+----------+--------+ Subclavian vein    31                                    +---------------+----------+-------------+----------+--------+ Confluence        157                                    +---------------+----------+-------------+----------+--------+ Shoulder          119                                    +---------------+----------+-------------+----------+--------+ Prox UA           114                                    +---------------+----------+-------------+----------+--------+ Mid UA            115                                    +---------------+----------+-------------+----------+--------+ Dist UA           239                                    +---------------+----------+-------------+----------+--------+   +--------------+-------------+---------+---------+---------+-------------------+               Diameter (cm)  Depth  Branching   PSV       Flow Volume                                  (cm)             (cm/s)       (ml/min)       +--------------+-------------+---------+---------+---------+-------------------+  Lt Rad Art                                      51                        Dist                                                                      +--------------+-------------+---------+---------+---------+-------------------+  Summary: The Left Brachial Cephalic AVF appears to be patent throughout; Flow Volume appears to be Normal.  *See table(s) above for measurements and observations.  Diagnosing physician: Hortencia Pilar MD Electronically signed by Hortencia Pilar MD on 06/27/2021 at 6:27:52 PM.   --------------------------------------------------------------------------------   Final     Assessment and plan- Patient is a 66 y.o. male referred for history of smoldering multiple myeloma  Patient has been seen at [facility] in the past for his history of smoldering multiple myeloma.  He was last seen by them in 2021 and has not had a follow-up since then.  I discussed differences between MGUS smoldering multiple myeloma and overt multiple myeloma.  I will check a CBC with differential, CMP, myeloma panel, serum free light chains to get a baseline as far as a smoldering multiple myeloma is concerned.  I will see him back in 2 weeks to discuss the results of blood work for in person or video visit.    Thank you for this kind referral and the opportunity to participate in the care of this patient   Visit Diagnosis 1. Smoldering multiple myeloma     Dr. Randa Evens, MD, MPH [facility] at [facility] [phone] 07/24/2021

## 2021-07-26 ENCOUNTER — Ambulatory Visit
Admission: RE | Admit: 2021-07-26 | Discharge: 2021-07-26 | Disposition: A | Discharge status: Home / Self Care | Payer: Medicare Other | Source: Ambulatory Visit | Attending: Specialist | Admitting: Specialist

## 2021-07-26 DIAGNOSIS — R058 Other specified cough: Secondary | ICD-10-CM

## 2021-07-26 LAB — KAPPA/LAMBDA LIGHT CHAINS
Kappa free light chain: 290.6 mg/L — ABNORMAL HIGH (ref 3.3–19.4)
Kappa, lambda light chain ratio: 5.95 — ABNORMAL HIGH (ref 0.26–1.65)
Lambda free light chains: 48.8 mg/L — ABNORMAL HIGH (ref 5.7–26.3)

## 2021-07-31 LAB — MULTIPLE MYELOMA PANEL, SERUM
Albumin SerPl Elph-Mcnc: 3.2 g/dL (ref 2.9–4.4)
Albumin/Glob SerPl: 0.7 (ref 0.7–1.7)
Alpha 1: 0.2 g/dL (ref 0.0–0.4)
Alpha2 Glob SerPl Elph-Mcnc: 0.8 g/dL (ref 0.4–1.0)
B-Globulin SerPl Elph-Mcnc: 0.9 g/dL (ref 0.7–1.3)
Gamma Glob SerPl Elph-Mcnc: 2.6 g/dL — ABNORMAL HIGH (ref 0.4–1.8)
Globulin, Total: 4.6 g/dL — ABNORMAL HIGH (ref 2.2–3.9)
IgA: 63 mg/dL (ref 61–437)
IgG (Immunoglobin G), Serum: 2912 mg/dL — ABNORMAL HIGH (ref 603–1613)
IgM (Immunoglobulin M), Srm: 197 mg/dL — ABNORMAL HIGH (ref 20–172)
M Protein SerPl Elph-Mcnc: 2.2 g/dL — ABNORMAL HIGH
Total Protein ELP: 7.8 g/dL (ref 6.0–8.5)

## 2021-08-07 ENCOUNTER — Encounter: Payer: Self-pay | Admitting: Oncology

## 2021-08-07 ENCOUNTER — Inpatient Hospital Stay: Payer: Medicare Other | Attending: Oncology | Admitting: Oncology

## 2021-08-07 DIAGNOSIS — D472 Monoclonal gammopathy: Secondary | ICD-10-CM

## 2021-08-08 NOTE — Progress Notes (Signed)
I connected with Nathaniel [name] on 08/08/21 at  3:15 PM EDT by video enabled telemedicine visit and verified that I am speaking with the correct person using two identifiers.   I discussed the limitations, risks, security and privacy concerns of performing an evaluation and management service by telemedicine and the availability of in-person appointments. I also discussed with the patient that there may be a patient responsible charge related to this service. The patient expressed understanding and agreed to proceed.  Other persons participating in the visit and their role in the encounter:  none  Patient's location:  home Provider's location:  work  Risk analyst Complaint: Discuss results of blood work  History of present illness: Patient is a 66 year old male with a past medical history significant for COPD, ESRD on hemodialysis, hyperlipidemia and smoldering multiple myeloma.Patient was initially seen at [facility] back in 2012 for evaluation of low back pain and monoclonal gammopathy.  Hemoglobin at that point was 16.3 with a creatinine of 1.24 calcium of 9.4 and an albumin of 4.  LDH elevated at 640.  IgG 2840 and SPEP showed IgG kappa M protein of 2 g.  Free light chain ratio elevated at 4.4 with serum free light chain 11.6.  24-hour urine protein electrophoresis showed 5.6 g of protein MRI and skeletal survey showed no evidence of active myeloma.  Bone marrow biopsy in May 2012 showed 12% plasma cell involvement by aspiration differential.  5 to 10% plasma cells by CD138 IHC.  No amyloidosis on [place] red stains.  Cytogenetics normal.  FISH normal.  Abdominal fat pad biopsy 2012 negative for amyloidosis.  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  No evidence of amyloidosis.  Last seen by [facility] in 2021 and expected management was continued.  Most recent IgG from January 2021 was 3442.  Interval history patient is currently at his baseline state of  health and denies any new complaints at this time   Review of Systems  Constitutional:  Negative for chills, fever, malaise/fatigue and weight loss.  HENT:  Negative for congestion, ear discharge and nosebleeds.   Eyes:  Negative for blurred vision.  Respiratory:  Negative for cough, hemoptysis, sputum production, shortness of breath and wheezing.   Cardiovascular:  Negative for chest pain, palpitations, orthopnea and claudication.  Gastrointestinal:  Negative for abdominal pain, blood in stool, constipation, diarrhea, heartburn, melena, nausea and vomiting.  Genitourinary:  Negative for dysuria, flank pain, frequency, hematuria and urgency.  Musculoskeletal:  Negative for back pain, joint pain and myalgias.  Skin:  Negative for rash.  Neurological:  Negative for dizziness, tingling, focal weakness, seizures, weakness and headaches.  Endo/Heme/Allergies:  Does not bruise/bleed easily.  Psychiatric/Behavioral:  Negative for depression and suicidal ideas. The patient does not have insomnia.     Allergies  Allergen Reactions   No Known Allergies     Past Medical History:  Diagnosis Date   Anemia    Arthritis    Chronic back pain    Dr. Quay Burow manages (per pt)   Chronic kidney disease    COPD (chronic obstructive pulmonary disease) ([place])    Dyspnea    with exertion   GERD (gastroesophageal reflux disease)    h/o   Hearing loss    Hyperlipidemia    MGUS (monoclonal gammopathy of unknown significance)    Multiple myeloma ([place]) 2011   and prostate ca in 2019   Proteinuria    Tobacco abuse     Past Surgical History:  Procedure Laterality Date   A/V FISTULAGRAM Left 01/18/2019   Procedure: A/V FISTULAGRAM;  Surgeon: Katha Cabal, MD;  Location: [facility];  Service: Cardiovascular;  Laterality: Left;   AV FISTULA PLACEMENT Left 10/29/2018   Procedure: ARTERIOVENOUS (AV) FISTULA CREATION ( BRACHIAL CEPHALIC);  Surgeon: Katha Cabal, MD;  Location: [facility];   Service: Vascular;  Laterality: Left;   COLONOSCOPY WITH PROPOFOL N/A 07/04/2014   Procedure: COLONOSCOPY WITH PROPOFOL;  Surgeon: Lucilla Lame, MD;  Location: [facility];  Service: Endoscopy;  Laterality: N/A;   TONSILLECTOMY     as child    Social History   Socioeconomic History   Marital status: Married    Spouse name: Not on file   Number of children: 2   Years of education: Not on file   Highest education level: Not on file  Occupational History   Not on file  Tobacco Use   Smoking status: Former    Packs/day: 2.00    Years: 50.00    Total pack years: 100.00    Types: Cigarettes    Quit date: 07/2020    Years since quitting: 1.0    Passive exposure: Current   Smokeless tobacco: Never  Vaping Use   Vaping Use: Never used  Substance and Sexual Activity   Alcohol use: No    Alcohol/week: 0.0 standard drinks of alcohol   Drug use: No   Sexual activity: Yes  Other Topics Concern   Not on file  Social History Narrative   Disabled, 2 sons-healthy   Social Determinants of Health   Financial Resource Strain: Not on file  Food Insecurity: Not on file  Transportation Needs: Not on file  Physical Activity: Not on file  Stress: Not on file  Social Connections: Not on file  Intimate Partner Violence: Not on file    Family History  Problem Relation Age of Onset   Heart disease Brother 56       MI   Colon cancer Neg Hx    Liver disease Neg Hx    Prostate cancer Neg Hx    Kidney cancer Neg Hx    Bladder Cancer Neg Hx      Current Outpatient Medications:    albuterol (VENTOLIN HFA) 108 (90 Base) MCG/ACT inhaler, Inhale 2 puffs into the lungs every 6 (six) hours as needed for wheezing or shortness of breath., Disp: 8.5 g, Rfl: 0   ASPIRIN LOW DOSE 81 MG tablet, Take 81 mg by mouth daily., Disp: , Rfl:    calcium acetate (PHOSLO) 667 MG capsule, Take 667 mg by mouth 2 (two) times daily., Disp: , Rfl:    Cholecalciferol (VITAMIN D3) 50 MCG (2000 UT) TABS, Take  2,000 Units by mouth at bedtime., Disp: , Rfl:    lidocaine-prilocaine (EMLA) cream, Apply 1 application topically as needed (prior to access for dialysis.)., Disp: , Rfl:    oxyCODONE-acetaminophen (PERCOCET/ROXICET) 5-325 MG tablet, Take 1 tablet by mouth 4 (four) times daily as needed for moderate pain. , Disp: , Rfl:    predniSONE (DELTASONE) 10 MG tablet, Prednisone 10 mg, 4 pills q day x 2 days, 3 pills q day x 2 days, 2 pills q day x 2 days, 1 pill q day x 2 days., Disp: , Rfl:    rosuvastatin (CRESTOR) 10 MG tablet, Take 10 mg by mouth daily., Disp: , Rfl:    sildenafil (VIAGRA) 100 MG tablet, Take by mouth., Disp: , Rfl:    fluticasone (FLONASE) 50  MCG/ACT nasal spray, Place 2 sprays into both nostrils daily as needed for allergies.  (Patient not taking: Reported on 07/24/2021), Disp: , Rfl:    Fluticasone-Umeclidin-Vilant (TRELEGY ELLIPTA) 100-62.5-25 MCG/INH AEPB, Inhale 1 puff into the lungs daily. (Patient not taking: Reported on 07/24/2021), Disp: 1 each, Rfl: 11   Multiple Vitamin (MULTIVITAMIN WITH MINERALS) TABS tablet, Take 1 tablet by mouth daily. (Patient not taking: Reported on 07/24/2021), Disp: , Rfl:    multivitamin (RENA-VIT) TABS tablet, Take 1 tablet by mouth daily. (Patient not taking: Reported on 07/24/2021), Disp: , Rfl:   CT CHEST WO CONTRAST  Result Date: 07/27/2021 CLINICAL DATA:  Productive cough, right upper lobe nodule. History of multiple myeloma. EXAM: CT CHEST WITHOUT CONTRAST TECHNIQUE: Multidetector CT imaging of the chest was performed following the standard protocol without IV contrast. RADIATION DOSE REDUCTION: This exam was performed according to the departmental dose-optimization program which includes automated exposure control, adjustment of the mA and/or kV according to patient size and/or use of iterative reconstruction technique. COMPARISON:  None Available. FINDINGS: Cardiovascular: The heart is normal in size and there is no pericardial effusion.  Three-vessel coronary artery calcification is noted. Atherosclerotic calcification of the aorta without evidence of aneurysm. The pulmonary trunk is normal in caliber. Mediastinum/Nodes: No enlarged mediastinal or axillary lymph nodes. The thyroid gland and trachea are within normal limits. There is mild thickening of the walls of the distal esophagus. Lungs/Pleura: Emphysematous changes are present in the lungs. Bronchial wall thickening is noted bilaterally. There is a cavitary nodule in the right upper lobe measuring 8 mm, axial image 47. A 3 mm nodule is present in the left upper lobe, axial image 45. No consolidation, effusion, or pneumothorax. Upper Abdomen: Stones are noted within the gallbladder. The liver has a slightly nodular contour which may be associated with underlying cirrhosis. The spleen is enlarged measuring 14.7 cm in length. Renal cysts are noted bilaterally. Musculoskeletal: No acute or suspicious osseous abnormality. IMPRESSION: 1. Mild bronchial wall thickening bilaterally which may be infectious or inflammatory. 2. Bilateral pulmonary nodules measuring up to 8 mm. Non-contrast chest CT at 6-12 months is recommended. If the nodule is stable at time of repeat CT, then future CT at 18-24 months (from today's scan) is considered optional for low-risk patients, but is recommended for high-risk patients. This recommendation follows the consensus statement: Guidelines for Management of Incidental Pulmonary Nodules Detected on CT Images: From the Fleischner Society 2017; Radiology 2017; 284:228-243. 3. Emphysema. 4. Cholelithiasis. 5. Nodular contour of the liver, possible underlying cirrhosis. 6. Splenomegaly. 7. Aortic atherosclerosis and coronary artery calcifications. Electronically Signed   By: Brett Fairy M.D.   On: 07/27/2021 00:34    No images are attached to the encounter.      Latest Ref Rng & Units 07/24/2021    3:24 PM  CMP  Glucose 70 - 99 mg/dL 92   BUN 8 - 23 mg/dL 25    Creatinine 0.61 - 1.24 mg/dL 3.39   Sodium 135 - 145 mmol/L 139   Potassium 3.5 - 5.1 mmol/L 4.5   Chloride 98 - 111 mmol/L 103   CO2 22 - 32 mmol/L 32   Calcium 8.9 - 10.3 mg/dL 7.7   Total Protein 6.5 - 8.1 g/dL 8.6   Total Bilirubin 0.3 - 1.2 mg/dL 0.7   Alkaline Phos 38 - 126 U/L 141   AST 15 - 41 U/L 55   ALT 0 - 44 U/L 63       Latest  Ref Rng & Units 07/24/2021    3:24 PM  CBC  WBC 4.0 - 10.5 K/uL 4.9   Hemoglobin 13.0 - 17.0 g/dL 11.9   Hematocrit 39.0 - 52.0 % 36.6   Platelets 150 - 400 K/uL 108      Observation/objective: Appears in no acute distress over video visit today.  Breathing is nonlabored  Assessment and plan: Patient is a 66 year old male with history of IgG kappa smoldering multiple myeloma currently under observation and this is a routine follow-up visit to discuss results of blood work  CBC shows hemoglobin that fluctuates between 11.5-12.5 which is at baseline.  Platelets have fluctuated between 100s to 140s which is also his baseline.  He does have some new onset microcytosis which we will follow-up with B12 folate and TSH testing on repeat labs.  Myeloma panel shows M protein of 2.2 g and a free light chain ratio of 5.9 which appears to be stable as compared to his prior labs.  No indication to treat his smoldering multiple myeloma at present.  Repeat labs in 6 months in 1 year and I will see him back in 1 year.  I will check ferritin and iron studies B12 folate and TSH again in 6 months.  Follow-up instructions: as above  I discussed the assessment and treatment plan with the patient. The patient was provided an opportunity to ask questions and all were answered. The patient agreed with the plan and demonstrated an understanding of the instructions.   The patient was advised to call back or seek an in-person evaluation if the symptoms worsen or if the condition fails to improve as anticipated.  I provided 15 minutes of face-to-face video visit time  during this encounter, and time spent in reviewing present and past labs and formulating treatment plan for the patient  Visit Diagnosis: 1. Smoldering multiple myeloma     Dr. Randa Evens, MD, MPH [facility] Tel- [phone] 08/08/2021 11:06 PM

## 2021-10-29 ENCOUNTER — Other Ambulatory Visit (INDEPENDENT_AMBULATORY_CARE_PROVIDER_SITE_OTHER): Payer: Self-pay | Admitting: Nurse Practitioner

## 2021-10-30 ENCOUNTER — Ambulatory Visit (INDEPENDENT_AMBULATORY_CARE_PROVIDER_SITE_OTHER): Payer: Medicare Other

## 2021-10-30 DIAGNOSIS — N186 End stage renal disease: Secondary | ICD-10-CM

## 2021-11-04 ENCOUNTER — Encounter (INDEPENDENT_AMBULATORY_CARE_PROVIDER_SITE_OTHER): Payer: Self-pay

## 2021-11-08 ENCOUNTER — Ambulatory Visit (INDEPENDENT_AMBULATORY_CARE_PROVIDER_SITE_OTHER): Payer: Medicare Other | Admitting: Nurse Practitioner

## 2021-11-08 ENCOUNTER — Encounter (INDEPENDENT_AMBULATORY_CARE_PROVIDER_SITE_OTHER): Payer: Self-pay | Admitting: Nurse Practitioner

## 2021-11-08 VITALS — BP 137/78 | HR 81 | Resp 16 | Wt 225.6 lb

## 2021-11-08 DIAGNOSIS — E782 Mixed hyperlipidemia: Secondary | ICD-10-CM | POA: Diagnosis not present

## 2021-11-08 DIAGNOSIS — I10 Essential (primary) hypertension: Secondary | ICD-10-CM | POA: Diagnosis not present

## 2021-11-08 DIAGNOSIS — N186 End stage renal disease: Secondary | ICD-10-CM

## 2021-11-14 ENCOUNTER — Other Ambulatory Visit: Payer: Self-pay | Admitting: Specialist

## 2021-11-14 DIAGNOSIS — J449 Chronic obstructive pulmonary disease, unspecified: Secondary | ICD-10-CM

## 2021-11-14 DIAGNOSIS — R918 Other nonspecific abnormal finding of lung field: Secondary | ICD-10-CM

## 2021-11-18 ENCOUNTER — Ambulatory Visit
Admission: RE | Admit: 2021-11-18 | Discharge: 2021-11-18 | Disposition: A | Discharge status: Home / Self Care | Payer: Medicare Other | Source: Ambulatory Visit | Attending: Specialist | Admitting: Specialist

## 2021-11-18 DIAGNOSIS — R918 Other nonspecific abnormal finding of lung field: Secondary | ICD-10-CM

## 2021-11-18 DIAGNOSIS — J449 Chronic obstructive pulmonary disease, unspecified: Secondary | ICD-10-CM

## 2021-11-22 ENCOUNTER — Telehealth (INDEPENDENT_AMBULATORY_CARE_PROVIDER_SITE_OTHER): Payer: Self-pay

## 2021-11-22 NOTE — Telephone Encounter (Signed)
Spoke with the patient and he is scheduled with Dr. Delana Meyer on 12/03/21 with a 1:00 pm arrival time to the MM. Pre-procedure instructions were discussed and will be mailed.

## 2021-12-01 ENCOUNTER — Encounter (INDEPENDENT_AMBULATORY_CARE_PROVIDER_SITE_OTHER): Payer: Self-pay | Admitting: Nurse Practitioner

## 2021-12-01 NOTE — Progress Notes (Signed)
Subjective:    Patient ID: Nathaniel [name], male    DOB: 1955-10-10, 66 y.o.   MRN: [id number] Chief Complaint  Patient presents with   Follow-up    Ref [facility] consult high pitch mid access with HDA    The patient returns to the office for follow up regarding a problem with their dialysis access.   The patient is referred by his dialysis center with concern for stenosis in his access site.  Is a high-pitched bruit.  The patient denies hand pain or other symptoms consistent with steal phenomena.  No significant arm swelling.  The patient denies redness or swelling at the access site. The patient denies fever or chills at home or while on dialysis.  No recent shortening of the patient's walking distance or new symptoms consistent with claudication.  No history of rest pain symptoms. No new ulcers or wounds of the lower extremities have occurred.  The patient denies amaurosis fugax or recent TIA symptoms. There are no recent neurological changes noted. There is no history of DVT, PE or superficial thrombophlebitis. No recent episodes of angina or shortness of breath documented.   Duplex ultrasound of the AV access shows a patent access.  The previously noted stenosis is significantly increased compared to last study.  Flow volume today is 3891 cc/min (previous flow volume was 2604 cc/min)    Review of Systems  Hematological:  Does not bruise/bleed easily.  All other systems reviewed and are negative.      Objective:   Physical Exam Vitals reviewed.  HENT:     Head: Normocephalic.  Cardiovascular:     Rate and Rhythm: Normal rate.     Pulses:          Radial pulses are 2+ on the left side.     Arteriovenous access: Left arteriovenous access is present.    Comments: Good thrill with high-pitched bruit Pulmonary:     Effort: Pulmonary effort is normal.  Skin:    General: Skin is warm and dry.  Neurological:     Mental Status: He is alert and oriented to person, place,  and time.  Psychiatric:        Mood and Affect: Mood normal.        Behavior: Behavior normal.        Thought Content: Thought content normal.        Judgment: Judgment normal.     BP 137/78 (BP Location: Right Arm)   Pulse 81   Resp 16   Wt 225 lb 9.6 oz (102.3 kg)   BMI 30.60 kg/m   Past Medical History:  Diagnosis Date   Anemia    Arthritis    Chronic back pain    Dr. Quay Burow manages (per pt)   Chronic kidney disease    COPD (chronic obstructive pulmonary disease) ([facility])    Dyspnea    with exertion   GERD (gastroesophageal reflux disease)    h/o   Hearing loss    Hyperlipidemia    MGUS (monoclonal gammopathy of unknown significance)    Multiple myeloma ([place]) 2011   and prostate ca in 2019   Proteinuria    Tobacco abuse     Social History   Socioeconomic History   Marital status: Married    Spouse name: Not on file   Number of children: 2   Years of education: Not on file   Highest education level: Not on file  Occupational History   Not on file  Tobacco Use   Smoking status: Former    Packs/day: 2.00    Years: 50.00    Total pack years: 100.00    Types: Cigarettes    Quit date: 07/2020    Years since quitting: 1.4    Passive exposure: Current   Smokeless tobacco: Never  Vaping Use   Vaping Use: Never used  Substance and Sexual Activity   Alcohol use: No    Alcohol/week: 0.0 standard drinks of alcohol   Drug use: No   Sexual activity: Yes  Other Topics Concern   Not on file  Social History Narrative   Disabled, 2 sons-healthy   Social Determinants of Health   Financial Resource Strain: Not on file  Food Insecurity: Not on file  Transportation Needs: Not on file  Physical Activity: Not on file  Stress: Not on file  Social Connections: Not on file  Intimate Partner Violence: Not on file    Past Surgical History:  Procedure Laterality Date   A/V FISTULAGRAM Left 01/18/2019   Procedure: A/V FISTULAGRAM;  Surgeon: Katha Cabal, MD;   Location: [facility];  Service: Cardiovascular;  Laterality: Left;   AV FISTULA PLACEMENT Left 10/29/2018   Procedure: ARTERIOVENOUS (AV) FISTULA CREATION ( BRACHIAL CEPHALIC);  Surgeon: Katha Cabal, MD;  Location: [facility];  Service: Vascular;  Laterality: Left;   COLONOSCOPY WITH PROPOFOL N/A 07/04/2014   Procedure: COLONOSCOPY WITH PROPOFOL;  Surgeon: Lucilla Lame, MD;  Location: [facility];  Service: Endoscopy;  Laterality: N/A;   TONSILLECTOMY     as child    Family History  Problem Relation Age of Onset   Heart disease Brother 75       MI   Colon cancer Neg Hx    Liver disease Neg Hx    Prostate cancer Neg Hx    Kidney cancer Neg Hx    Bladder Cancer Neg Hx     Allergies  Allergen Reactions   No Known Allergies        Latest Ref Rng & Units 07/24/2021    3:24 PM 06/04/2020    3:08 PM 10/26/2018    2:10 PM  CBC  WBC 4.0 - 10.5 K/uL 4.9  5.4  4.6   Hemoglobin 13.0 - 17.0 g/dL 11.9  13.0  12.4   Hematocrit 39.0 - 52.0 % 36.6  38.1  38.9   Platelets 150 - 400 K/uL 108  150  141       CMP     Component Value Date/Time   NA 139 07/24/2021 1524   K 4.5 07/24/2021 1524   CL 103 07/24/2021 1524   CO2 32 07/24/2021 1524   GLUCOSE 92 07/24/2021 1524   BUN 25 (H) 07/24/2021 1524   CREATININE 3.39 (H) 07/24/2021 1524   CALCIUM 7.7 (L) 07/24/2021 1524   PROT 8.6 (H) 07/24/2021 1524   ALBUMIN 3.3 (L) 07/24/2021 1524   AST 55 (H) 07/24/2021 1524   ALT 63 (H) 07/24/2021 1524   ALKPHOS 141 (H) 07/24/2021 1524   BILITOT 0.7 07/24/2021 1524   GFRNONAA 19 (L) 07/24/2021 1524   GFRAA 16 (L) 10/26/2018 1410     No results found.     Assessment & Plan:   1. End stage renal disease ([place]) Recommend:  The patient is experiencing increasing problems with their dialysis access.  Patient should have a fistulagram with the intention for intervention.  The intention for intervention is to restore appropriate flow and prevent thrombosis and possible loss  of the access.  As well as improve the quality of dialysis therapy.  The risks, benefits and alternative therapies were reviewed in detail with the patient.  All questions were answered.  The patient agrees to proceed with angio/intervention.    The patient will follow up with me in the office after the procedure.   2. Benign essential HTN Continue antihypertensive medications as already ordered, these medications have been reviewed and there are no changes at this time.  3. Mixed hyperlipidemia Continue statin as ordered and reviewed, no changes at this time   Current Outpatient Medications on File Prior to Visit  Medication Sig Dispense Refill   albuterol (VENTOLIN HFA) 108 (90 Base) MCG/ACT inhaler Inhale 2 puffs into the lungs every 6 (six) hours as needed for wheezing or shortness of breath. 8.5 g 0   ASPIRIN LOW DOSE 81 MG tablet Take 81 mg by mouth daily.     calcium acetate (PHOSLO) 667 MG capsule Take 667 mg by mouth 2 (two) times daily.     Cholecalciferol (VITAMIN D3) 50 MCG (2000 UT) TABS Take 2,000 Units by mouth at bedtime.     lidocaine-prilocaine (EMLA) cream Apply 1 application topically as needed (prior to access for dialysis.).     oxyCODONE-acetaminophen (PERCOCET/ROXICET) 5-325 MG tablet Take 1 tablet by mouth 4 (four) times daily as needed for moderate pain.      predniSONE (DELTASONE) 10 MG tablet Prednisone 10 mg, 4 pills q day x 2 days, 3 pills q day x 2 days, 2 pills q day x 2 days, 1 pill q day x 2 days.     rosuvastatin (CRESTOR) 10 MG tablet Take 10 mg by mouth daily.     sildenafil (VIAGRA) 100 MG tablet Take by mouth.     fluticasone (FLONASE) 50 MCG/ACT nasal spray Place 2 sprays into both nostrils daily as needed for allergies.  (Patient not taking: Reported on 07/24/2021)     Fluticasone-Umeclidin-Vilant (TRELEGY ELLIPTA) 100-62.5-25 MCG/INH AEPB Inhale 1 puff into the lungs daily. (Patient not taking: Reported on 07/24/2021) 1 each 11   Multiple Vitamin  (MULTIVITAMIN WITH MINERALS) TABS tablet Take 1 tablet by mouth daily. (Patient not taking: Reported on 07/24/2021)     multivitamin (RENA-VIT) TABS tablet Take 1 tablet by mouth daily. (Patient not taking: Reported on 07/24/2021)     No current facility-administered medications on file prior to visit.    There are no Patient Instructions on file for this visit. No follow-ups on file.   Kris Hartmann, NP

## 2021-12-01 NOTE — H&P (View-Only) (Signed)
Subjective:    Patient ID: Nathaniel [name], male    DOB: January 07, 1955, 66 y.o.   MRN: [id number] Chief Complaint  Patient presents with   Follow-up    Ref Man[facility]an consult high pitch mid access with HDA    The patient returns to the office for follow up regarding a problem with their dialysis access.   The patient is referred by his dialysis center with concern for stenosis in his access site.  Is a high-pitched bruit.  The patient denies hand pain or other symptoms consistent with steal phenomena.  No significant arm swelling.  The patient denies redness or swelling at the access site. The patient denies fever or chills at home or while on dialysis.  No recent shortening of the patient's walking distance or new symptoms consistent with claudication.  No history of rest pain symptoms. No new ulcers or wounds of the lower extremities have occurred.  The patient denies amaurosis fugax or recent TIA symptoms. There are no recent neurological changes noted. There is no history of DVT, PE or superficial thrombophlebitis. No recent episodes of angina or shortness of breath documented.   Duplex ultrasound of the AV access shows a patent access.  The previously noted stenosis is significantly increased compared to last study.  Flow volume today is 3891 cc/min (previous flow volume was 2604 cc/min)    Review of Systems  Hematological:  Does not bruise/bleed easily.  All other systems reviewed and are negative.      Objective:   Physical Exam Vitals reviewed.  HENT:     Head: Normocephalic.  Cardiovascular:     Rate and Rhythm: Normal rate.     Pulses:          Radial pulses are 2+ on the left side.     Arteriovenous access: Left arteriovenous access is present.    Comments: Good thrill with high-pitched bruit Pulmonary:     Effort: Pulmonary effort is normal.  Skin:    General: Skin is warm and dry.  Neurological:     Mental Status: He is alert and oriented to person, place,  and time.  Psychiatric:        Mood and Affect: Mood normal.        Behavior: Behavior normal.        Thought Content: Thought content normal.        Judgment: Judgment normal.     BP 137/78 (BP Location: Right Arm)   Pulse 81   Resp 16   Wt 225 lb 9.6 oz (102.3 kg)   BMI 30.60 kg/m   Past Medical History:  Diagnosis Date   Anemia    Arthritis    Chronic back pain    Dr. Quay Burow manages (per pt)   Chronic kidney disease    COPD (chronic obstructive pulmonary disease) (HCC)    Dyspnea    with exertion   GERD (gastroesophageal reflux disease)    h/o   Hearing loss    Hyperlipidemia    MGUS (monoclonal gammopathy of unknown significance)    Multiple myeloma ([place]) 2011   and prostate ca in 2019   Proteinuria    Tobacco abuse     Social History   Socioeconomic History   Marital status: Married    Spouse name: Not on file   Number of children: 2   Years of education: Not on file   Highest education level: Not on file  Occupational History   Not on file  Tobacco Use   Smoking status: Former    Packs/day: 2.00    Years: 50.00    Total pack years: 100.00    Types: Cigarettes    Quit date: 07/2020    Years since quitting: 1.4    Passive exposure: Current   Smokeless tobacco: Never  Vaping Use   Vaping Use: Never used  Substance and Sexual Activity   Alcohol use: No    Alcohol/week: 0.0 standard drinks of alcohol   Drug use: No   Sexual activity: Yes  Other Topics Concern   Not on file  Social History Narrative   Disabled, 2 sons-healthy   Social Determinants of Health   Financial Resource Strain: Not on file  Food Insecurity: Not on file  Transportation Needs: Not on file  Physical Activity: Not on file  Stress: Not on file  Social Connections: Not on file  Intimate Partner Violence: Not on file    Past Surgical History:  Procedure Laterality Date   A/V FISTULAGRAM Left 01/18/2019   Procedure: A/V FISTULAGRAM;  Surgeon: Katha Cabal, MD;   Location: [facility];  Service: Cardiovascular;  Laterality: Left;   AV FISTULA PLACEMENT Left 10/29/2018   Procedure: ARTERIOVENOUS (AV) FISTULA CREATION ( BRACHIAL CEPHALIC);  Surgeon: Katha Cabal, MD;  Location: [facility];  Service: Vascular;  Laterality: Left;   COLONOSCOPY WITH PROPOFOL N/A 07/04/2014   Procedure: COLONOSCOPY WITH PROPOFOL;  Surgeon: Lucilla Lame, MD;  Location: [facility];  Service: Endoscopy;  Laterality: N/A;   TONSILLECTOMY     as child    Family History  Problem Relation Age of Onset   Heart disease Brother 35       MI   Colon cancer Neg Hx    Liver disease Neg Hx    Prostate cancer Neg Hx    Kidney cancer Neg Hx    Bladder Cancer Neg Hx     Allergies  Allergen Reactions   No Known Allergies        Latest Ref Rng & Units 07/24/2021    3:24 PM 06/04/2020    3:08 PM 10/26/2018    2:10 PM  CBC  WBC 4.0 - 10.5 K/uL 4.9  5.4  4.6   Hemoglobin 13.0 - 17.0 g/dL 11.9  13.0  12.4   Hematocrit 39.0 - 52.0 % 36.6  38.1  38.9   Platelets 150 - 400 K/uL 108  150  141       CMP     Component Value Date/Time   NA 139 07/24/2021 1524   K 4.5 07/24/2021 1524   CL 103 07/24/2021 1524   CO2 32 07/24/2021 1524   GLUCOSE 92 07/24/2021 1524   BUN 25 (H) 07/24/2021 1524   CREATININE 3.39 (H) 07/24/2021 1524   CALCIUM 7.7 (L) 07/24/2021 1524   PROT 8.6 (H) 07/24/2021 1524   ALBUMIN 3.3 (L) 07/24/2021 1524   AST 55 (H) 07/24/2021 1524   ALT 63 (H) 07/24/2021 1524   ALKPHOS 141 (H) 07/24/2021 1524   BILITOT 0.7 07/24/2021 1524   GFRNONAA 19 (L) 07/24/2021 1524   GFRAA 16 (L) 10/26/2018 1410     No results found.     Assessment & Plan:   1. End stage renal disease ([place]) Recommend:  The patient is experiencing increasing problems with their dialysis access.  Patient should have a fistulagram with the intention for intervention.  The intention for intervention is to restore appropriate flow and prevent thrombosis and possible loss  of the access.  As well as improve the quality of dialysis therapy.  The risks, benefits and alternative therapies were reviewed in detail with the patient.  All questions were answered.  The patient agrees to proceed with angio/intervention.    The patient will follow up with me in the office after the procedure.   2. Benign essential HTN Continue antihypertensive medications as already ordered, these medications have been reviewed and there are no changes at this time.  3. Mixed hyperlipidemia Continue statin as ordered and reviewed, no changes at this time   Current Outpatient Medications on File Prior to Visit  Medication Sig Dispense Refill   albuterol (VENTOLIN HFA) 108 (90 Base) MCG/ACT inhaler Inhale 2 puffs into the lungs every 6 (six) hours as needed for wheezing or shortness of breath. 8.5 g 0   ASPIRIN LOW DOSE 81 MG tablet Take 81 mg by mouth daily.     calcium acetate (PHOSLO) 667 MG capsule Take 667 mg by mouth 2 (two) times daily.     Cholecalciferol (VITAMIN D3) 50 MCG (2000 UT) TABS Take 2,000 Units by mouth at bedtime.     lidocaine-prilocaine (EMLA) cream Apply 1 application topically as needed (prior to access for dialysis.).     oxyCODONE-acetaminophen (PERCOCET/ROXICET) 5-325 MG tablet Take 1 tablet by mouth 4 (four) times daily as needed for moderate pain.      predniSONE (DELTASONE) 10 MG tablet Prednisone 10 mg, 4 pills q day x 2 days, 3 pills q day x 2 days, 2 pills q day x 2 days, 1 pill q day x 2 days.     rosuvastatin (CRESTOR) 10 MG tablet Take 10 mg by mouth daily.     sildenafil (VIAGRA) 100 MG tablet Take by mouth.     fluticasone (FLONASE) 50 MCG/ACT nasal spray Place 2 sprays into both nostrils daily as needed for allergies.  (Patient not taking: Reported on 07/24/2021)     Fluticasone-Umeclidin-Vilant (TRELEGY ELLIPTA) 100-62.5-25 MCG/INH AEPB Inhale 1 puff into the lungs daily. (Patient not taking: Reported on 07/24/2021) 1 each 11   Multiple Vitamin  (MULTIVITAMIN WITH MINERALS) TABS tablet Take 1 tablet by mouth daily. (Patient not taking: Reported on 07/24/2021)     multivitamin (RENA-VIT) TABS tablet Take 1 tablet by mouth daily. (Patient not taking: Reported on 07/24/2021)     No current facility-administered medications on file prior to visit.    There are no Patient Instructions on file for this visit. No follow-ups on file.   Kris Hartmann, NP

## 2021-12-03 ENCOUNTER — Ambulatory Visit
Admission: RE | Admit: 2021-12-03 | Discharge: 2021-12-03 | Disposition: A | Discharge status: Home / Self Care | Payer: Medicare Other | Attending: Vascular Surgery | Admitting: Vascular Surgery

## 2021-12-03 ENCOUNTER — Other Ambulatory Visit: Payer: Self-pay

## 2021-12-03 ENCOUNTER — Encounter
Admission: RE | Disposition: A | Discharge status: Home / Self Care | Payer: Self-pay | Source: Home / Self Care | Attending: Vascular Surgery

## 2021-12-03 ENCOUNTER — Encounter: Payer: Self-pay | Admitting: Vascular Surgery

## 2021-12-03 DIAGNOSIS — N186 End stage renal disease: Secondary | ICD-10-CM | POA: Insufficient documentation

## 2021-12-03 DIAGNOSIS — Z992 Dependence on renal dialysis: Secondary | ICD-10-CM | POA: Insufficient documentation

## 2021-12-03 DIAGNOSIS — E782 Mixed hyperlipidemia: Secondary | ICD-10-CM | POA: Diagnosis not present

## 2021-12-03 DIAGNOSIS — T82898A Other specified complication of vascular prosthetic devices, implants and grafts, initial encounter: Secondary | ICD-10-CM | POA: Insufficient documentation

## 2021-12-03 DIAGNOSIS — I120 Hypertensive chronic kidney disease with stage 5 chronic kidney disease or end stage renal disease: Secondary | ICD-10-CM | POA: Diagnosis not present

## 2021-12-03 DIAGNOSIS — Z87891 Personal history of nicotine dependence: Secondary | ICD-10-CM | POA: Diagnosis not present

## 2021-12-03 DIAGNOSIS — Y841 Kidney dialysis as the cause of abnormal reaction of the patient, or of later complication, without mention of misadventure at the time of the procedure: Secondary | ICD-10-CM | POA: Insufficient documentation

## 2021-12-03 DIAGNOSIS — Z86010 Personal history of colonic polyps: Secondary | ICD-10-CM

## 2021-12-03 HISTORY — PX: A/V FISTULAGRAM: CATH118298

## 2021-12-03 LAB — POTASSIUM (ARMC VASCULAR LAB ONLY): Potassium (ARMC vascular lab): 4.8 mmol/L (ref 3.5–5.1)

## 2021-12-03 SURGERY — A/V FISTULAGRAM
Anesthesia: Moderate Sedation | Laterality: Left

## 2021-12-03 MED ORDER — HYDROMORPHONE HCL 1 MG/ML IJ SOLN: 1.0000 mg | Freq: Once | INTRAMUSCULAR | Status: DC | PRN

## 2021-12-03 MED ORDER — MIDAZOLAM HCL 2 MG/2ML IJ SOLN
INTRAMUSCULAR | Status: AC
  Filled 2021-12-03: qty 2

## 2021-12-03 MED ORDER — SODIUM CHLORIDE 0.9 % IV SOLN: INTRAVENOUS | Status: DC

## 2021-12-03 MED ORDER — ONDANSETRON HCL 4 MG/2ML IJ SOLN
INTRAMUSCULAR | Status: AC
  Administered 2021-12-03: 4 mg via INTRAVENOUS
  Filled 2021-12-03: qty 2

## 2021-12-03 MED ORDER — IODIXANOL 320 MG/ML IV SOLN
INTRAVENOUS | Status: DC | PRN
  Administered 2021-12-03: 30 mL via INTRAVENOUS

## 2021-12-03 MED ORDER — HEPARIN SODIUM (PORCINE) 1000 UNIT/ML IJ SOLN
INTRAMUSCULAR | Status: AC
  Filled 2021-12-03: qty 10

## 2021-12-03 MED ORDER — CEFAZOLIN SODIUM-DEXTROSE 1-4 GM/50ML-% IV SOLN
1.0000 g | INTRAVENOUS | Status: AC
Start: 2021-12-03 — End: 2021-12-03

## 2021-12-03 MED ORDER — FENTANYL CITRATE (PF) 100 MCG/2ML IJ SOLN
INTRAMUSCULAR | Status: AC
  Filled 2021-12-03: qty 2

## 2021-12-03 MED ORDER — ONDANSETRON HCL 4 MG/2ML IJ SOLN: 4.0000 mg | Freq: Four times a day (QID) | INTRAMUSCULAR | Status: DC | PRN

## 2021-12-03 MED ORDER — SODIUM CHLORIDE 0.9 % IV SOLN
INTRAVENOUS | Status: DC
Start: 2021-12-03 — End: 2021-12-03

## 2021-12-03 MED ORDER — FAMOTIDINE 20 MG PO TABS: 40.0000 mg | ORAL_TABLET | Freq: Once | ORAL | Status: DC | PRN

## 2021-12-03 MED ORDER — FENTANYL CITRATE (PF) 100 MCG/2ML IJ SOLN
INTRAMUSCULAR | Status: DC | PRN
  Administered 2021-12-03 (×2): 50 ug via INTRAVENOUS

## 2021-12-03 MED ORDER — MIDAZOLAM HCL 2 MG/ML PO SYRP: 8.0000 mg | ORAL_SOLUTION | Freq: Once | ORAL | Status: DC | PRN

## 2021-12-03 MED ORDER — DIPHENHYDRAMINE HCL 50 MG/ML IJ SOLN: 50.0000 mg | Freq: Once | INTRAMUSCULAR | Status: DC | PRN

## 2021-12-03 MED ORDER — MIDAZOLAM HCL 2 MG/2ML IJ SOLN
INTRAMUSCULAR | Status: DC | PRN
  Administered 2021-12-03: 2 mg via INTRAVENOUS
  Administered 2021-12-03: 1 mg via INTRAVENOUS

## 2021-12-03 MED ORDER — CEFAZOLIN SODIUM-DEXTROSE 1-4 GM/50ML-% IV SOLN
INTRAVENOUS | Status: AC
  Administered 2021-12-03: 1 g via INTRAVENOUS
  Filled 2021-12-03: qty 50

## 2021-12-03 MED ORDER — METHYLPREDNISOLONE SODIUM SUCC 125 MG IJ SOLR: 125.0000 mg | Freq: Once | INTRAMUSCULAR | Status: DC | PRN

## 2021-12-03 SURGICAL SUPPLY — 10 items
CANNULA 5F STIFF (CANNULA) IMPLANT
CATH KUMPE SOFT-VU 5FR 65 (CATHETERS) IMPLANT
COVER PROBE ULTRASOUND 5X96 (MISCELLANEOUS) IMPLANT
DRAPE BRACHIAL (DRAPES) IMPLANT
GLIDEWIRE STIFF .35X180X3 HYDR (WIRE) IMPLANT
GOWN STRL REUS W/ TWL LRG LVL3 (GOWN DISPOSABLE) ×1 IMPLANT
GOWN STRL REUS W/TWL LRG LVL3 (GOWN DISPOSABLE) ×1
PACK ANGIOGRAPHY (CUSTOM PROCEDURE TRAY) ×1 IMPLANT
SHEATH BRITE TIP 6FRX5.5 (SHEATH) IMPLANT
SUT MNCRL AB 4-0 PS2 18 (SUTURE) IMPLANT

## 2021-12-03 NOTE — Interval H&P Note (Signed)
History and Physical Interval Note:  12/03/2021 1:48 PM  Nathaniel [name]  has presented today for surgery, with the diagnosis of L arm fistulagram   End Stage Renal.  The various methods of treatment have been discussed with the patient and family. After consideration of risks, benefits and other options for treatment, the patient has consented to  Procedure(s): A/V Fistulagram (Left) as a surgical intervention.  The patient's history has been reviewed, patient examined, no change in status, stable for surgery.  I have reviewed the patient's chart and labs.  Questions were answered to the patient's satisfaction.     Hortencia Pilar

## 2021-12-03 NOTE — Op Note (Signed)
OPERATIVE NOTE   PROCEDURE: Contrast injection left arm brachiocephalic fistula  PRE-OPERATIVE DIAGNOSIS: Complication of dialysis access                                                       End Stage Renal Disease  POST-OPERATIVE DIAGNOSIS: same as above   SURGEON: Katha Cabal, M.D.  ANESTHESIA: Conscious sedation was administered under my direct supervision by the interventional radiology RN.  IV Versed plus fentanyl were utilized. Continuous ECG, pulse oximetry and blood pressure was monitored throughout the entire procedure.  Conscious sedation was for a total of 28 minutes.  ESTIMATED BLOOD LOSS: minimal  FINDING(S): Widely patent fistula it is quite large and I have concerns that we may be progressing to volume overload of the heart based on the flow volumes.  I will review this with the patient it is quite possible he may need banding in the future.  At the time of banding I would also ligate the extra branches  SPECIMEN(S):  None  CONTRAST: 30 cc  FLUOROSCOPY TIME: 1.7 minutes  INDICATIONS: Elber Galyean is a 66 y.o. male who  presents with malfunctioning left arm AV access.  The patient is scheduled for angiography with possible intervention of the AV access to prevent loss of the permanent access.  The patient is aware the risks include but are not limited to: bleeding, infection, thrombosis of the cannulated access, and possible anaphylactic reaction to the contrast.  The patient acknowledges if the access can not be salvaged a tunneled catheter will be needed and will be placed during this procedure.  The patient is aware of the risks of the procedure and elects to proceed with the angiogram and intervention.  DESCRIPTION: After full informed written consent was obtained, the patient was brought back to the Special Procedure suite and placed supine position.  Appropriate cardiopulmonary monitors were placed.  The left arm was prepped and draped in the standard  fashion.  Appropriate timeout is called.   The left brachiocephalic access was cannulated with a micropuncture needle under ultrasound guidence.  Ultrasound was used to evaluate the left brachiocephalic access.  It was echolucent and compressible indicating it is patent .  An ultrasound image was acquired for the permanent record.  A micropuncture needle was used to access the left brachiocephalic access under direct ultrasound guidance.  The microwire was then advanced under fluoroscopic guidance without difficulty followed by the micro-sheath.  The J-wire was then advanced and a 6 Fr sheath inserted.  Hand injections were completed to image the access from the arterial anastomosis through the entire access.  The central venous structures were also imaged by hand injections.  Based on the images, the fistula is quite large measuring greater than 15 mm throughout the majority of its course up to the level of the deltoid.  There are 4-5 prominent branches which do feel the axillary vein.  The cephalic confluence is widely patent central veins are widely patent.    A 4-0 Monocryl purse-string suture was sewn around the sheath.  The sheath was removed and light pressure was applied.  A sterile bandage was applied to the puncture site.    COMPLICATIONS: None  CONDITION: Carlynn Purl, M.D [facility] Office: [phone]  12/03/2021 2:47 PM

## 2021-12-05 ENCOUNTER — Encounter: Payer: Self-pay | Admitting: Vascular Surgery

## 2021-12-10 ENCOUNTER — Ambulatory Visit
Admission: RE | Admit: 2021-12-10 | Discharge: 2021-12-10 | Disposition: A | Discharge status: Home / Self Care | Payer: Medicare Other | Attending: Gastroenterology | Admitting: Gastroenterology

## 2021-12-10 ENCOUNTER — Ambulatory Visit: Payer: Medicare Other | Admitting: Certified Registered Nurse Anesthetist

## 2021-12-10 ENCOUNTER — Other Ambulatory Visit: Payer: Self-pay

## 2021-12-10 ENCOUNTER — Encounter
Admission: RE | Disposition: A | Discharge status: Home / Self Care | Payer: Self-pay | Source: Home / Self Care | Attending: Gastroenterology

## 2021-12-10 ENCOUNTER — Encounter: Payer: Self-pay | Admitting: Gastroenterology

## 2021-12-10 DIAGNOSIS — J449 Chronic obstructive pulmonary disease, unspecified: Secondary | ICD-10-CM | POA: Diagnosis not present

## 2021-12-10 DIAGNOSIS — E785 Hyperlipidemia, unspecified: Secondary | ICD-10-CM | POA: Insufficient documentation

## 2021-12-10 DIAGNOSIS — M1990 Unspecified osteoarthritis, unspecified site: Secondary | ICD-10-CM | POA: Insufficient documentation

## 2021-12-10 DIAGNOSIS — Z8546 Personal history of malignant neoplasm of prostate: Secondary | ICD-10-CM | POA: Insufficient documentation

## 2021-12-10 DIAGNOSIS — N186 End stage renal disease: Secondary | ICD-10-CM | POA: Diagnosis not present

## 2021-12-10 DIAGNOSIS — Z87891 Personal history of nicotine dependence: Secondary | ICD-10-CM | POA: Insufficient documentation

## 2021-12-10 DIAGNOSIS — K640 First degree hemorrhoids: Secondary | ICD-10-CM | POA: Diagnosis not present

## 2021-12-10 DIAGNOSIS — Z8579 Personal history of other malignant neoplasms of lymphoid, hematopoietic and related tissues: Secondary | ICD-10-CM | POA: Diagnosis not present

## 2021-12-10 DIAGNOSIS — K5730 Diverticulosis of large intestine without perforation or abscess without bleeding: Secondary | ICD-10-CM | POA: Insufficient documentation

## 2021-12-10 DIAGNOSIS — I120 Hypertensive chronic kidney disease with stage 5 chronic kidney disease or end stage renal disease: Secondary | ICD-10-CM | POA: Diagnosis not present

## 2021-12-10 DIAGNOSIS — Z1211 Encounter for screening for malignant neoplasm of colon: Secondary | ICD-10-CM | POA: Insufficient documentation

## 2021-12-10 DIAGNOSIS — K219 Gastro-esophageal reflux disease without esophagitis: Secondary | ICD-10-CM | POA: Diagnosis not present

## 2021-12-10 DIAGNOSIS — Z860100 Personal history of colon polyps, unspecified: Secondary | ICD-10-CM

## 2021-12-10 DIAGNOSIS — D124 Benign neoplasm of descending colon: Secondary | ICD-10-CM | POA: Insufficient documentation

## 2021-12-10 DIAGNOSIS — Z79899 Other long term (current) drug therapy: Secondary | ICD-10-CM | POA: Insufficient documentation

## 2021-12-10 DIAGNOSIS — D122 Benign neoplasm of ascending colon: Secondary | ICD-10-CM | POA: Diagnosis not present

## 2021-12-10 DIAGNOSIS — D123 Benign neoplasm of transverse colon: Secondary | ICD-10-CM

## 2021-12-10 HISTORY — PX: COLONOSCOPY WITH PROPOFOL: SHX5780

## 2021-12-10 SURGERY — COLONOSCOPY WITH PROPOFOL
Anesthesia: General

## 2021-12-10 MED ORDER — PHENYLEPHRINE 80 MCG/ML (10ML) SYRINGE FOR IV PUSH (FOR BLOOD PRESSURE SUPPORT)
PREFILLED_SYRINGE | INTRAVENOUS | Status: AC
  Filled 2021-12-10: qty 10

## 2021-12-10 MED ORDER — GLYCOPYRROLATE 0.2 MG/ML IJ SOLN
INTRAMUSCULAR | Status: AC
  Filled 2021-12-10: qty 1

## 2021-12-10 MED ORDER — EPHEDRINE 5 MG/ML INJ
INTRAVENOUS | Status: AC
  Filled 2021-12-10: qty 5

## 2021-12-10 MED ORDER — LIDOCAINE HCL (PF) 2 % IJ SOLN
INTRAMUSCULAR | Status: AC
  Filled 2021-12-10: qty 5

## 2021-12-10 MED ORDER — PROPOFOL 500 MG/50ML IV EMUL
INTRAVENOUS | Status: DC | PRN
  Administered 2021-12-10: 170 ug/kg/min via INTRAVENOUS

## 2021-12-10 MED ORDER — PROPOFOL 10 MG/ML IV BOLUS
INTRAVENOUS | Status: DC | PRN
  Administered 2021-12-10: 40 mg via INTRAVENOUS
  Administered 2021-12-10: 60 mg via INTRAVENOUS

## 2021-12-10 MED ORDER — LIDOCAINE HCL (CARDIAC) PF 100 MG/5ML IV SOSY
PREFILLED_SYRINGE | INTRAVENOUS | Status: DC | PRN
  Administered 2021-12-10: 50 mg via INTRAVENOUS

## 2021-12-10 MED ORDER — PHENYLEPHRINE 80 MCG/ML (10ML) SYRINGE FOR IV PUSH (FOR BLOOD PRESSURE SUPPORT)
PREFILLED_SYRINGE | INTRAVENOUS | Status: DC | PRN
  Administered 2021-12-10 (×4): 160 ug via INTRAVENOUS

## 2021-12-10 MED ORDER — ONDANSETRON HCL 4 MG/2ML IJ SOLN
INTRAMUSCULAR | Status: AC
  Filled 2021-12-10: qty 2

## 2021-12-10 MED ORDER — SODIUM CHLORIDE 0.9 % IV SOLN: INTRAVENOUS | Status: DC

## 2021-12-10 NOTE — Anesthesia Procedure Notes (Signed)
Procedure Name: MAC Date/Time: 12/10/2021 8:51 AM  Performed by: Tollie Eth, CRNAPre-anesthesia Checklist: Patient identified, Emergency Drugs available, Suction available and Patient being monitored Patient Re-evaluated:Patient Re-evaluated prior to induction Oxygen Delivery Method: Nasal cannula Induction Type: IV induction Placement Confirmation: positive ETCO2

## 2021-12-10 NOTE — Anesthesia Preprocedure Evaluation (Signed)
Anesthesia Evaluation  Patient identified by MRN, date of birth, ID band Patient awake    Reviewed: Allergy & Precautions, NPO status , Patient's Chart, lab work & pertinent test results, reviewed documented beta blocker date and time   History of Anesthesia Complications Negative for: history of anesthetic complications  Airway Mallampati: III  TM Distance: >3 FB     Dental  (+) Chipped, Dental Advidsory Given, Partial Upper, Missing, Poor Dentition   Pulmonary shortness of breath, COPD, neg recent URI, former smoker          Cardiovascular hypertension, Pt. on medications (-) angina + CAD  (-) Past MI and (-) Cardiac Stents (-) dysrhythmias (-) Valvular Problems/Murmurs     Neuro/Psych negative neurological ROS  negative psych ROS   GI/Hepatic Neg liver ROS,GERD  ,,  Endo/Other  negative endocrine ROS    Renal/GU ESRF and DialysisRenal disease     Musculoskeletal  (+) Arthritis ,    Abdominal   Peds  Hematology  (+) Blood dyscrasia, anemia   Anesthesia Other Findings Past Medical History: No date: Anemia No date: Arthritis No date: Chronic back pain     Comment:  Dr. Quay Burow manages (per pt) No date: Chronic kidney disease No date: COPD (chronic obstructive pulmonary disease) (HCC) No date: Dyspnea     Comment:  with exertion No date: GERD (gastroesophageal reflux disease)     Comment:  h/o No date: Hearing loss No date: Hyperlipidemia No date: MGUS (monoclonal gammopathy of unknown significance) 2011: Multiple myeloma ([place])     Comment:  and prostate ca in 2019 No date: Proteinuria No date: Tobacco abuse   Reproductive/Obstetrics                             Anesthesia Physical Anesthesia Plan  ASA: 4  Anesthesia Plan: General   Post-op Pain Management:    Induction: Intravenous  PONV Risk Score and Plan: 2 and Propofol infusion and TIVA  Airway Management Planned:  Natural Airway and Nasal Cannula  Additional Equipment:   Intra-op Plan:   Post-operative Plan:   Informed Consent: I have reviewed the patients History and Physical, chart, labs and discussed the procedure including the risks, benefits and alternatives for the proposed anesthesia with the patient or authorized representative who has indicated his/her understanding and acceptance.       Plan Discussed with: CRNA  Anesthesia Plan Comments:         Anesthesia Quick Evaluation

## 2021-12-10 NOTE — Op Note (Signed)
[facility] Gastroenterology Patient Name: Nathaniel [name] Procedure Date: 12/10/2021 8:49 AM MRN: [id number] Account #: [id number] Date of Birth: [birth year] Admit Type: Outpatient Age: 66 Room: [facility] ENDO ROOM 4 Gender: Male Note Status: Finalized Instrument Name: Jasper Riling [id number] Procedure:             Colonoscopy Indications:           High risk colon cancer surveillance: Personal history                         of colonic polyps Providers:             Lucilla Lame MD, MD Medicines:             Propofol per Anesthesia Procedure:             Pre-Anesthesia Assessment:                        - Prior to the procedure, a History and Physical was                         performed, and patient medications and allergies were                         reviewed. The patient's tolerance of previous                         anesthesia was also reviewed. The risks and benefits                         of the procedure and the sedation options and risks                         were discussed with the patient. All questions were                         answered, and informed consent was obtained. Prior                         Anticoagulants: The patient has taken no anticoagulant                         or antiplatelet agents. ASA Grade Assessment: II - A                         patient with mild systemic disease. After reviewing                         the risks and benefits, the patient was deemed in                         satisfactory condition to undergo the procedure.                        After obtaining informed consent, the colonoscope was                         passed under direct vision. Throughout the procedure,  the patient's blood pressure, pulse, and oxygen                         saturations were monitored continuously. The                         Colonoscope was introduced through the anus and                         advanced to the the  cecum, identified by appendiceal                         orifice and ileocecal valve. The colonoscopy was                         performed without difficulty. The patient tolerated                         the procedure well. The quality of the bowel                         preparation was excellent. Findings:      The perianal and digital rectal examinations were normal.      A 5 mm polyp was found in the transverse colon. The polyp was sessile.       The polyp was removed with a cold snare. Resection and retrieval were       complete.      Two pedunculated polyps were found in the ascending colon. The polyps       were 7 to 13 mm in size. These polyps were removed with a hot snare.       Resection and retrieval were complete.      Non-bleeding internal hemorrhoids were found during retroflexion. The       hemorrhoids were Grade I (internal hemorrhoids that do not prolapse).      Multiple small-mouthed diverticula were found in the sigmoid colon. Impression:            - One 5 mm polyp in the transverse colon, removed with                         a cold snare. Resected and retrieved.                        - Two 7 to 13 mm polyps in the ascending colon,                         removed with a hot snare. Resected and retrieved.                        - Non-bleeding internal hemorrhoids.                        - Diverticulosis in the sigmoid colon. Recommendation:        - Discharge patient to home.                        - Resume previous diet.                        -  Continue present medications.                        - Await pathology results.                        - Repeat colonoscopy in 1 year for surveillance. Procedure Code(s):     --- Professional ---                        [phone], Colonoscopy, flexible; with removal of                         tumor(s), polyp(s), or other lesion(s) by snare                         technique Diagnosis Code(s):     --- Professional ---                         Z86.010, Personal history of colonic polyps                        D12.3, Benign neoplasm of transverse colon (hepatic                         flexure or splenic flexure) CPT copyright 2022 American Medical Association. All rights reserved. The codes documented in this report are preliminary and upon coder review may  be revised to meet current compliance requirements. Lucilla Lame MD, MD 12/10/2021 9:32:53 AM This report has been signed electronically. Number of Addenda: 0 Note Initiated On: 12/10/2021 8:49 AM Scope Withdrawal Time: 0 hours 14 minutes 11 seconds  Total Procedure Duration: 0 hours 22 minutes 15 seconds  Estimated Blood Loss:  Estimated blood loss: none.      [facility]

## 2021-12-10 NOTE — Transfer of Care (Signed)
Immediate Anesthesia Transfer of Care Note  Patient: Nathaniel [name]  Procedure(s) Performed: COLONOSCOPY WITH PROPOFOL  Patient Location: PACU  Anesthesia Type:General  Level of Consciousness: drowsy  Airway & Oxygen Therapy: Patient Spontanous Breathing  Post-op Assessment: Report given to RN and Post -op Vital signs reviewed and stable  Post vital signs: Reviewed and stable  Last Vitals:  Vitals Value Taken Time  BP 99/46 12/10/21 0935  Temp 36.1 C 12/10/21 0934  Pulse 65 12/10/21 0935  Resp 24 12/10/21 0935  SpO2 97 % 12/10/21 0935  Vitals shown include unvalidated device data.  Last Pain:  Vitals:   12/10/21 0934  TempSrc: Temporal  PainSc: 0-No pain         Complications: No notable events documented.

## 2021-12-10 NOTE — H&P (Signed)
Lucilla Lame, MD [facility] [address] [place], [place] [address] [phone] Fax : [phone]  Primary Care Physician:  [facility] [place] Primary Gastroenterologist:  Dr. Allen Norris  Pre-Procedure History & Physical: HPI:  Nathaniel [name] is a 66 y.o. male is here for an colonoscopy.   Past Medical History:  Diagnosis Date   Anemia    Arthritis    Chronic back pain    Dr. Quay Burow manages (per pt)   Chronic kidney disease    COPD (chronic obstructive pulmonary disease) ([place])    Dyspnea    with exertion   GERD (gastroesophageal reflux disease)    h/o   Hearing loss    Hyperlipidemia    MGUS (monoclonal gammopathy of unknown significance)    Multiple myeloma ([place]) 2011   and prostate ca in 2019   Proteinuria    Tobacco abuse     Past Surgical History:  Procedure Laterality Date   A/V FISTULAGRAM Left 01/18/2019   Procedure: A/V FISTULAGRAM;  Surgeon: Katha Cabal, MD;  Location: [place] [facility];  Service: Cardiovascular;  Laterality: Left;   A/V FISTULAGRAM Left 12/03/2021   Procedure: A/V Fistulagram;  Surgeon: Katha Cabal, MD;  Location: [place] [facility];  Service: Cardiovascular;  Laterality: Left;   AV FISTULA PLACEMENT Left 10/29/2018   Procedure: ARTERIOVENOUS (AV) FISTULA CREATION ( BRACHIAL CEPHALIC);  Surgeon: Katha Cabal, MD;  Location: [facility];  Service: Vascular;  Laterality: Left;   COLONOSCOPY WITH PROPOFOL N/A 07/04/2014   Procedure: COLONOSCOPY WITH PROPOFOL;  Surgeon: Lucilla Lame, MD;  Location: [facility];  Service: Endoscopy;  Laterality: N/A;   TONSILLECTOMY     as child    Prior to Admission medications   Medication Sig Start Date End Date Taking? Authorizing Provider  albuterol (VENTOLIN HFA) 108 (90 Base) MCG/ACT inhaler Inhale 2 puffs into the lungs every 6 (six) hours as needed for wheezing or shortness of breath. 06/09/21  Yes Fisher, Linden Dolin, PA-C  ASPIRIN LOW DOSE 81 MG tablet Take  81 mg by mouth daily. 07/04/21  Yes [provider]  calcium acetate (PHOSLO) 667 MG capsule Take 667 mg by mouth 2 (two) times daily.   Yes [provider]  Cholecalciferol (VITAMIN D3) 50 MCG (2000 UT) TABS Take 2,000 Units by mouth at bedtime.   Yes [provider]  fluticasone (FLONASE) 50 MCG/ACT nasal spray Place 2 sprays into both nostrils daily as needed for allergies. 01/25/15  Yes [provider]  Fluticasone-Umeclidin-Vilant (TRELEGY ELLIPTA) 100-62.5-25 MCG/INH AEPB Inhale 1 puff into the lungs daily. 08/05/18  Yes Tyler Pita, MD  lidocaine-prilocaine (EMLA) cream Apply 1 application topically as needed (prior to access for dialysis.).   Yes [provider]  Multiple Vitamin (MULTIVITAMIN WITH MINERALS) TABS tablet Take 1 tablet by mouth daily.   Yes [provider]  multivitamin (RENA-VIT) TABS tablet Take 1 tablet by mouth daily. 12/19/19  Yes [provider]  oxyCODONE-acetaminophen (PERCOCET/ROXICET) 5-325 MG tablet Take 1 tablet by mouth 4 (four) times daily as needed for moderate pain.    Yes [provider]  predniSONE (DELTASONE) 10 MG tablet Prednisone 10 mg, 4 pills q day x 2 days, 3 pills q day x 2 days, 2 pills q day x 2 days, 1 pill q day x 2 days. 07/17/21  Yes [provider]  rosuvastatin (CRESTOR) 10 MG tablet Take 10 mg by mouth daily. 07/04/21  Yes [provider]  sildenafil (VIAGRA) 100 MG tablet Take by mouth. 07/03/21  Yes [provider]    Allergies as of 07/16/2021 - Review Complete 07/16/2021  Allergen Reaction Noted   No known allergies  06/20/2019    Family History  Problem Relation Age of Onset   Heart disease Brother 51       MI   Colon cancer Neg Hx    Liver disease Neg Hx    Prostate cancer Neg Hx    Kidney cancer Neg Hx    Bladder Cancer Neg Hx     Social History   Socioeconomic History   Marital status: Married    Spouse name: Not on  file   Number of children: 2   Years of education: Not on file   Highest education level: Not on file  Occupational History   Not on file  Tobacco Use   Smoking status: Former    Packs/day: 2.00    Years: 50.00    Total pack years: 100.00    Types: Cigarettes    Quit date: 07/2020    Years since quitting: 1.4    Passive exposure: Current   Smokeless tobacco: Never  Vaping Use   Vaping Use: Never used  Substance and Sexual Activity   Alcohol use: No    Alcohol/week: 0.0 standard drinks of alcohol   Drug use: No   Sexual activity: Yes  Other Topics Concern   Not on file  Social History Narrative   Disabled, 2 sons-healthy   Social Determinants of Health   Financial Resource Strain: Not on file  Food Insecurity: Not on file  Transportation Needs: Not on file  Physical Activity: Not on file  Stress: Not on file  Social Connections: Not on file  Intimate Partner Violence: Not on file    Review of Systems: See HPI, otherwise negative ROS  Physical Exam: BP 123/69   Pulse 79   Temp (!) 97.1 F (36.2 C) (Temporal)   Resp 16   Ht 6' (1.829 m)   Wt 99.3 kg   SpO2 99%   BMI 29.70 kg/m  General:   Alert,  pleasant and cooperative in NAD Head:  Normocephalic and atraumatic. Neck:  Supple; no masses or thyromegaly. Lungs:  Clear throughout to auscultation.    Heart:  Regular rate and rhythm. Abdomen:  Soft, nontender and nondistended. Normal bowel sounds, without guarding, and without rebound.   Neurologic:  Alert and  oriented x4;  grossly normal neurologically.  Impression/Plan: Nathaniel [name] is here for an colonoscopy to be performed for a history of adenomatous polyps on 2016  Risks, benefits, limitations, and alternatives regarding  colonoscopy have been reviewed with the patient.  Questions have been answered.  All parties agreeable.   Lucilla Lame, MD  12/10/2021, 8:56 AM

## 2021-12-11 ENCOUNTER — Encounter: Payer: Self-pay | Admitting: Gastroenterology

## 2021-12-11 LAB — SURGICAL PATHOLOGY

## 2021-12-12 NOTE — Anesthesia Postprocedure Evaluation (Signed)
Anesthesia Post Note  Patient: Nathaniel [name]  Procedure(s) Performed: COLONOSCOPY WITH PROPOFOL  Patient location during evaluation: Endoscopy Anesthesia Type: General Level of consciousness: awake and alert Pain management: pain level controlled Vital Signs Assessment: post-procedure vital signs reviewed and stable Respiratory status: spontaneous breathing, nonlabored ventilation, respiratory function stable and patient connected to nasal cannula oxygen Cardiovascular status: blood pressure returned to baseline and stable Postop Assessment: no apparent nausea or vomiting Anesthetic complications: no   No notable events documented.   Last Vitals:  Vitals:   12/10/21 0954 12/10/21 1004  BP:  (!) 118/56  Pulse:    Resp:    Temp:    SpO2: 98%     Last Pain:  Vitals:   12/11/21 0754  TempSrc:   PainSc: 0-No pain                 Martha Clan

## 2022-01-14 ENCOUNTER — Emergency Department: Payer: 59

## 2022-01-14 ENCOUNTER — Emergency Department
Admission: EM | Admit: 2022-01-14 | Discharge: 2022-01-14 | Disposition: A | Discharge status: Home / Self Care | Payer: 59 | Attending: Emergency Medicine | Admitting: Emergency Medicine

## 2022-01-14 ENCOUNTER — Other Ambulatory Visit: Payer: Self-pay

## 2022-01-14 DIAGNOSIS — J069 Acute upper respiratory infection, unspecified: Secondary | ICD-10-CM | POA: Diagnosis not present

## 2022-01-14 DIAGNOSIS — J449 Chronic obstructive pulmonary disease, unspecified: Secondary | ICD-10-CM | POA: Insufficient documentation

## 2022-01-14 DIAGNOSIS — Z1152 Encounter for screening for COVID-19: Secondary | ICD-10-CM | POA: Insufficient documentation

## 2022-01-14 DIAGNOSIS — N186 End stage renal disease: Secondary | ICD-10-CM | POA: Insufficient documentation

## 2022-01-14 DIAGNOSIS — R059 Cough, unspecified: Secondary | ICD-10-CM | POA: Diagnosis present

## 2022-01-14 DIAGNOSIS — Z992 Dependence on renal dialysis: Secondary | ICD-10-CM | POA: Diagnosis not present

## 2022-01-14 LAB — RESP PANEL BY RT-PCR (RSV, FLU A&B, COVID)  RVPGX2
Influenza A by PCR: NEGATIVE
Influenza B by PCR: NEGATIVE
Resp Syncytial Virus by PCR: NEGATIVE
SARS Coronavirus 2 by RT PCR: NEGATIVE

## 2022-01-14 MED ORDER — PREDNISONE 20 MG PO TABS: 40.0000 mg | ORAL_TABLET | Freq: Every day | ORAL | 0 refills | Status: AC

## 2022-01-14 MED ORDER — LEVOFLOXACIN 500 MG PO TABS: 500.0000 mg | ORAL_TABLET | ORAL | 0 refills | Status: AC

## 2022-01-14 MED ORDER — ALBUTEROL SULFATE HFA 108 (90 BASE) MCG/ACT IN AERS: 2.0000 | INHALATION_SPRAY | RESPIRATORY_TRACT | 0 refills | Status: AC | PRN

## 2022-01-14 MED ORDER — PREDNISONE 20 MG PO TABS
60.0000 mg | ORAL_TABLET | Freq: Once | ORAL | Status: AC
  Administered 2022-01-14: 60 mg via ORAL
  Filled 2022-01-14: qty 3

## 2022-01-14 MED ORDER — LEVOFLOXACIN 500 MG PO TABS
500.0000 mg | ORAL_TABLET | Freq: Once | ORAL | Status: AC
  Administered 2022-01-14: 500 mg via ORAL
  Filled 2022-01-14: qty 1

## 2022-01-14 NOTE — ED Triage Notes (Signed)
Pt presents to ED with c/o of cough and "feeling bad". Pt is a dialysis TX and states full TX yesterday. Pt states HX of COPD. Pt speaking in full sentences.

## 2022-01-14 NOTE — ED Provider Triage Note (Signed)
Emergency Medicine Provider Triage Evaluation Note  Nathaniel [name] , a 66 y.o. male  was evaluated in triage.  Pt complains of feeling bad with cough.  Had Covid and feels the same.  Dialysis patient.   Review of Systems  Positive: COPD in past, dialysis Negative: No aware of fever  Physical Exam  BP 135/80 (BP Location: Right Arm)   Pulse 94   Temp 97.8 F (36.6 C) (Oral)   Resp 19   SpO2 97%  Gen:   Awake, no distress   Talks in complete sentences.  Resp:  Normal effort Lungs Clear bilat MSK:   Moves extremities without difficulty  Other:    Medical Decision Making  Medically screening exam initiated at 8:24 AM.  Appropriate orders placed.  Reford Olliff was informed that the remainder of the evaluation will be completed by another provider, this initial triage assessment does not replace that evaluation, and the importance of remaining in the ED until their evaluation is complete.     Johnn Hai, PA-C 01/14/22 [id number]

## 2022-01-14 NOTE — Discharge Instructions (Signed)
Use your albuterol inhaler every 4 hours while awake for the next 2 days.  Take the antibiotic every 48 hours.  Start this after dialysis on dialysis days.

## 2022-01-14 NOTE — ED Notes (Signed)
Pt states since 1/3 he has had runny nose, congestion, ear fullness, sore throat, body aches and unsure of fever. He has productive cough states yellowish sputum. States wife has same symptoms.

## 2022-01-14 NOTE — ED Provider Notes (Signed)
[facility] Provider Note    Event Date/Time   First MD Initiated Contact with Patient 01/14/22 1037     (approximate)   History   Cough   HPI  Nathaniel [name] is a 67 y.o. male here with productive cough, fatigue, chills.  The patient has a history of COPD, end-stage renal disease, states that he was at the beach over [place].  He was around a lot of people.  He states that he has since had persistent, productive cough.  He has had associated decreased appetite.  He has been going to dialysis.  He states he has not wanted to drink.  Over the last 2 days, he has also had nausea, vomiting, and watery diarrhea.  He has been able to eat today, however.  His wife has similar symptoms.  No known fevers.     Physical Exam   Triage Vital Signs: ED Triage Vitals [01/14/22 0823]  Enc Vitals Group     BP 135/80     Pulse Rate 94     Resp 19     Temp 97.8 F (36.6 C)     Temp Source Oral     SpO2 97 %     Weight      Height      Head Circumference      Peak Flow      Pain Score 0     Pain Loc      Pain Edu?      Excl. in [place]?     Most recent vital signs: Vitals:   01/14/22 0823  BP: 135/80  Pulse: 94  Resp: 19  Temp: 97.8 F (36.6 C)  SpO2: 97%     General: Awake, no distress.  CV:  Good peripheral perfusion.  Regular rate and rhythm. Resp:  Normal effort.  Slight expiratory wheezes.  Normal work of breathing.  Speaking in full sentences. Abd:  No distention.  No tenderness. Other:  Well-appearing, nontoxic.   ED Results / Procedures / Treatments   Labs (all labs ordered are listed, but only abnormal results are displayed) Labs Reviewed  RESP PANEL BY RT-PCR (RSV, FLU A&B, COVID)  [id number]     EKG    RADIOLOGY Chest x-ray: COPD, lung nodule is stable   I also independently reviewed and agree with radiologist interpretations.   PROCEDURES:  Critical Care performed: No   MEDICATIONS ORDERED IN ED: Medications   predniSONE (DELTASONE) tablet 60 mg (60 mg Oral Given 01/14/22 1216)  levofloxacin (LEVAQUIN) tablet 500 mg (500 mg Oral Given 01/14/22 1216)     IMPRESSION / MDM / ASSESSMENT AND PLAN / ED COURSE  I reviewed the triage vital signs and the nursing notes.                              Differential diagnosis includes, but is not limited to, COPD exacerbation, URI, [facility] pneumonia, atelectasis, CHF, ACS, PE  Patient's presentation is most consistent with acute presentation with potential threat to life or bodily function.  The patient is on the cardiac monitor to evaluate for evidence of arrhythmia and/or significant heart rate changes.  67 year old male with history of end-stage renal disease, COPD, here with cough, cold, nausea, vomiting, diarrhea.  Suspect viral URI.  Chest x-ray shows no focal pneumonia.  He has some mild expiratory wheezes but is satting well on room air.  Will treat with steroids.  Given his sputum production, duration of symptoms, comorbidities, will treat as well for possible early pneumonia with broad-spectrum antibiotics given his dialysis use.  Otherwise, he does not appear to be septic.  He is tolerating p.o.  Otherwise nontoxic.    FINAL CLINICAL IMPRESSION(S) / ED DIAGNOSES   Final diagnoses:  Viral URI with cough     Rx / DC Orders   ED Discharge Orders          Ordered    levofloxacin (LEVAQUIN) 500 MG tablet  Every 48 hours        01/14/22 1223    predniSONE (DELTASONE) 20 MG tablet  Daily        01/14/22 1223    albuterol (VENTOLIN HFA) 108 (90 Base) MCG/ACT inhaler  Every 4 hours PRN        01/14/22 1223             Note:  This document was prepared using Dragon voice recognition software and may include unintentional dictation errors.   Duffy Bruce, MD 01/14/22 1224

## 2022-02-06 ENCOUNTER — Other Ambulatory Visit: Payer: Self-pay | Admitting: *Deleted

## 2022-02-06 DIAGNOSIS — R718 Other abnormality of red blood cells: Secondary | ICD-10-CM

## 2022-02-06 DIAGNOSIS — D472 Monoclonal gammopathy: Secondary | ICD-10-CM

## 2022-02-07 ENCOUNTER — Inpatient Hospital Stay: Payer: 59 | Attending: Oncology

## 2022-06-26 IMAGING — CR DG CHEST 2V
1 series · 3 of 3 positions shown · non-contrast
Comparison: 11/12/2013

CLINICAL DATA: Shortness of breath.

EXAM:
CHEST - 2 VIEW

[Series 1: dg chest 2 view · 0.14mm/px · 3 of 3 slices shown]
[im 1/3]
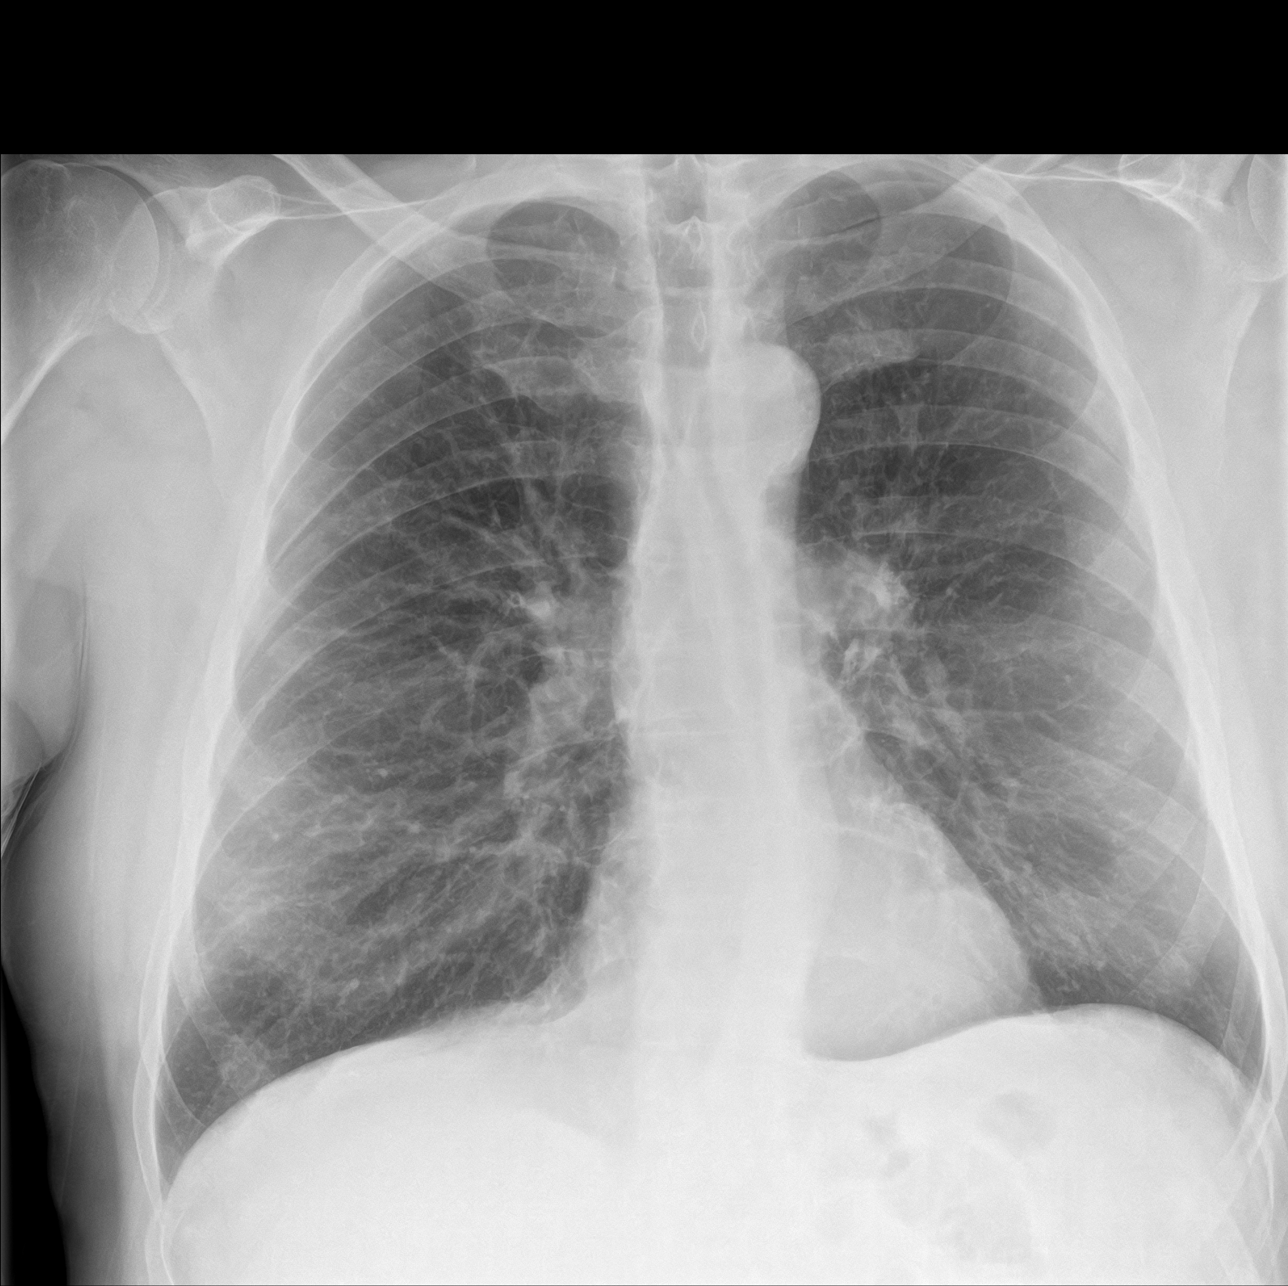
[im 2/3]
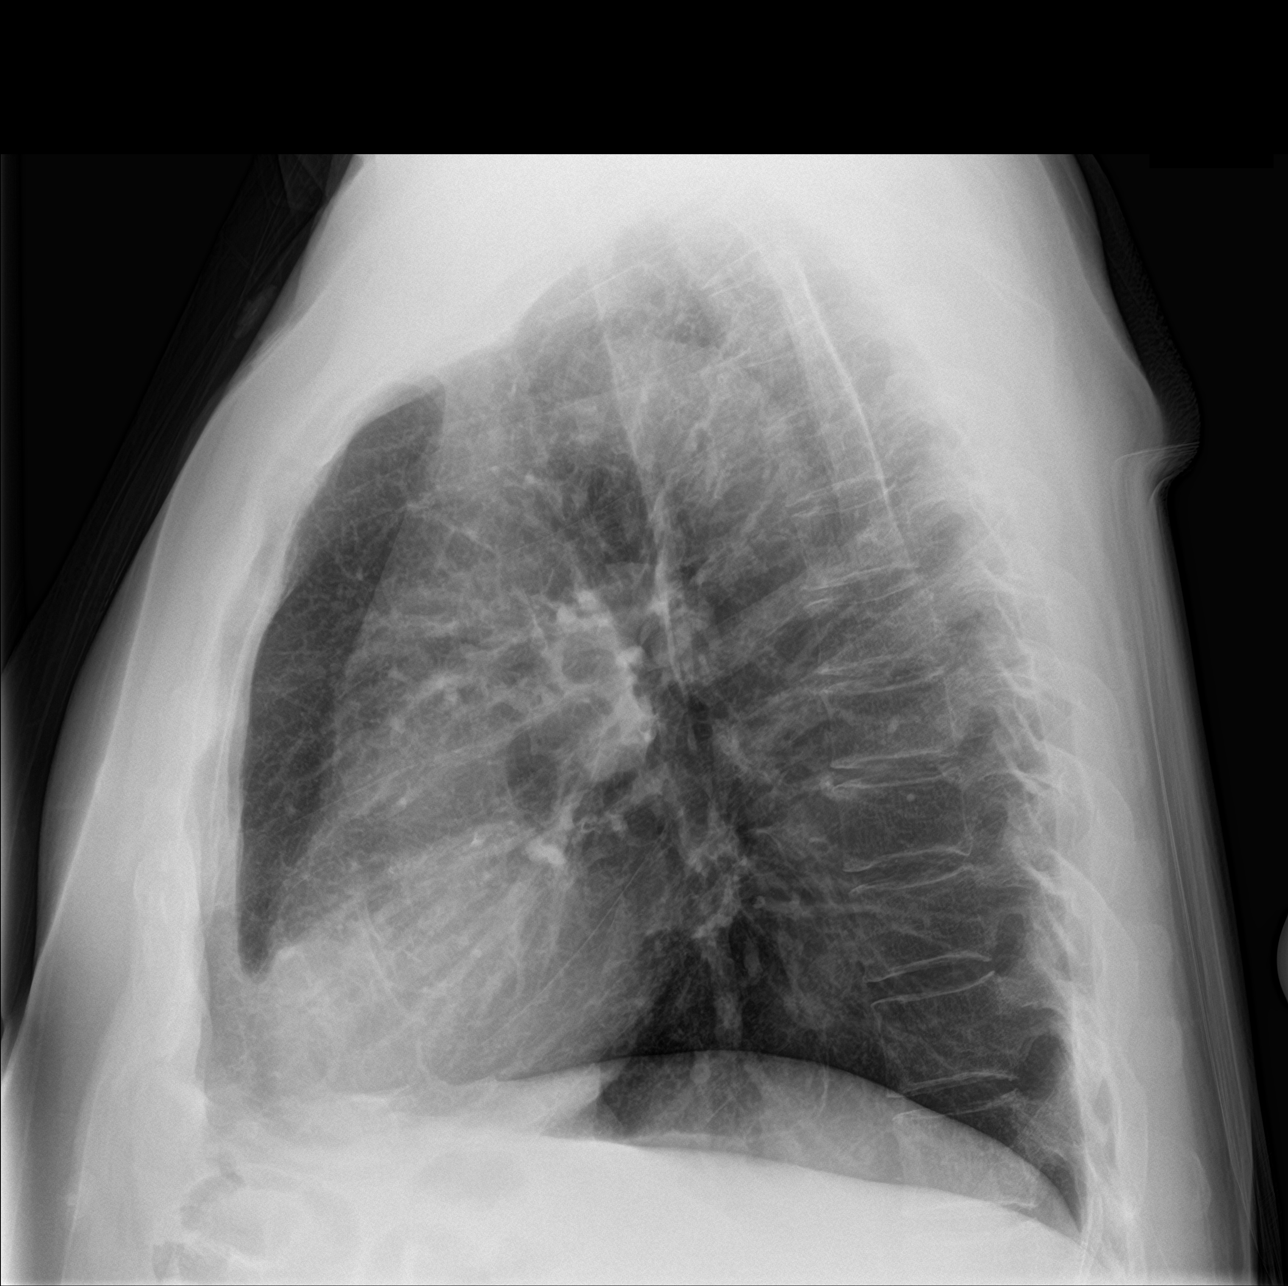
[im 3/3]
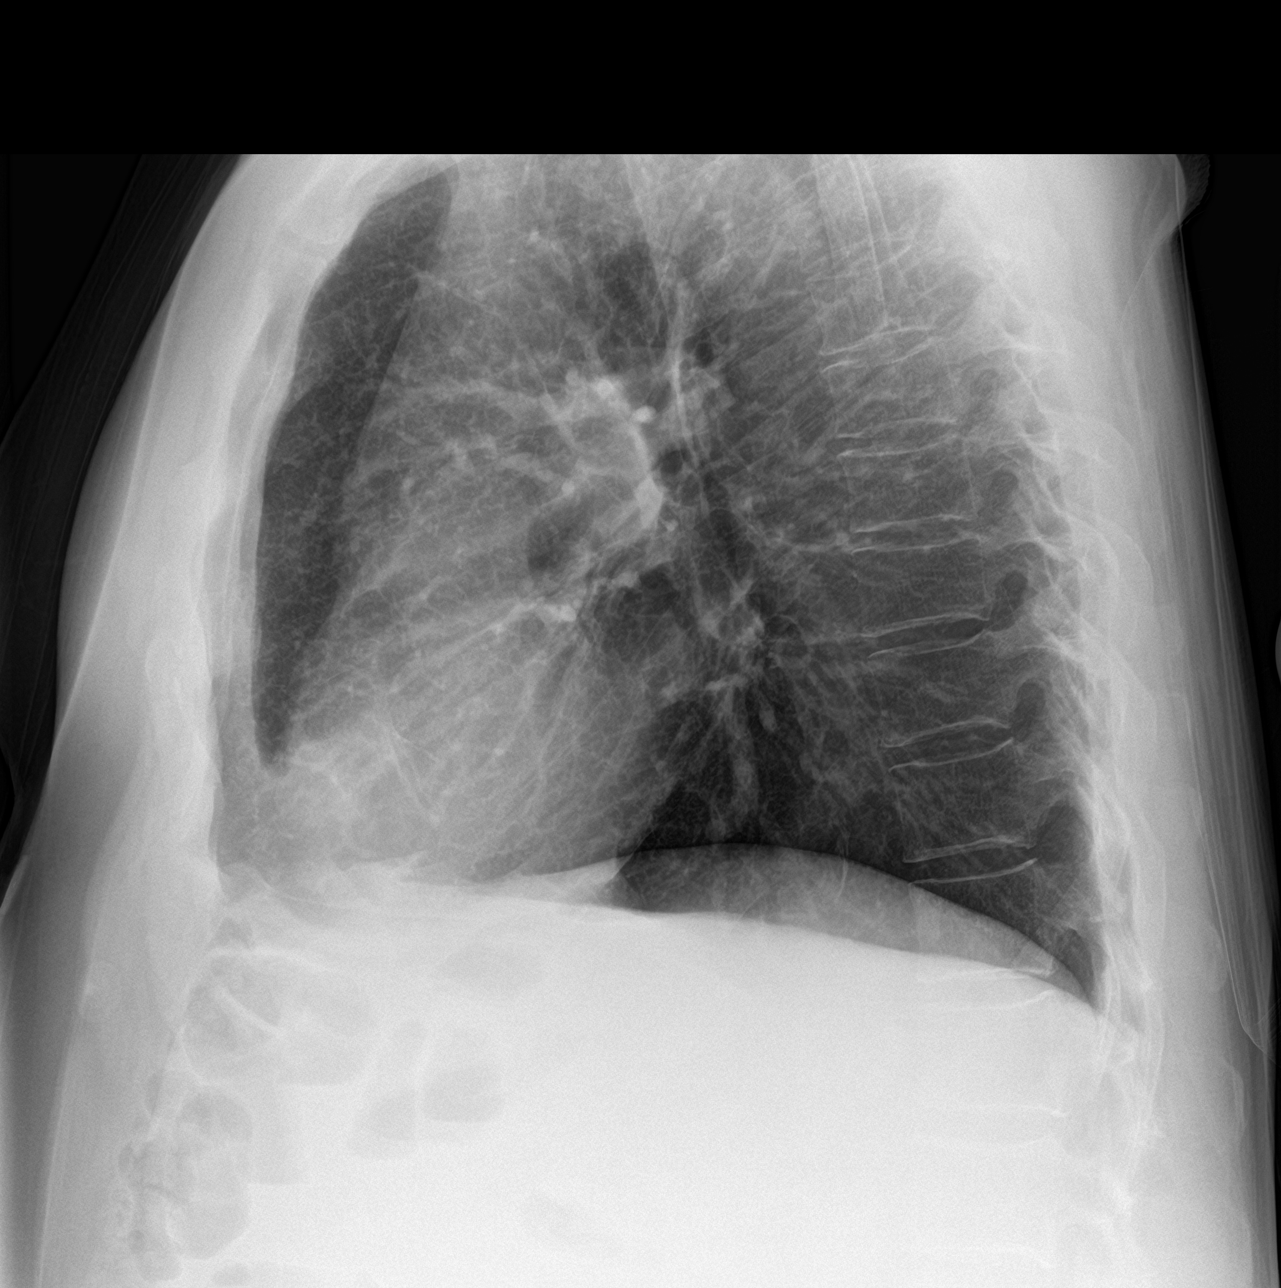

[3 of 3 positions shown; findings below may reference images not displayed]

FINDINGS: The heart size and mediastinal contours are within normal limits.
Both lungs are clear. The visualized skeletal structures are
unremarkable.
IMPRESSION: No active cardiopulmonary disease.

## 2022-06-30 ENCOUNTER — Encounter (INDEPENDENT_AMBULATORY_CARE_PROVIDER_SITE_OTHER): Payer: Medicare Other

## 2022-06-30 ENCOUNTER — Ambulatory Visit (INDEPENDENT_AMBULATORY_CARE_PROVIDER_SITE_OTHER): Payer: Medicare Other | Admitting: Vascular Surgery

## 2022-07-11 ENCOUNTER — Other Ambulatory Visit: Payer: Medicare Other

## 2022-07-14 ENCOUNTER — Other Ambulatory Visit: Payer: Self-pay | Admitting: Family Medicine

## 2022-07-14 ENCOUNTER — Other Ambulatory Visit: Payer: Self-pay

## 2022-07-14 DIAGNOSIS — C61 Malignant neoplasm of prostate: Secondary | ICD-10-CM

## 2022-07-14 DIAGNOSIS — Z87891 Personal history of nicotine dependence: Secondary | ICD-10-CM

## 2022-07-15 ENCOUNTER — Other Ambulatory Visit: Payer: 59

## 2022-07-15 DIAGNOSIS — C61 Malignant neoplasm of prostate: Secondary | ICD-10-CM

## 2022-07-16 ENCOUNTER — Ambulatory Visit: Payer: Medicare Other | Admitting: Urology

## 2022-07-16 LAB — PSA: Prostate Specific Ag, Serum: 0.5 ng/mL (ref 0.0–4.0)

## 2022-07-17 ENCOUNTER — Ambulatory Visit: Payer: 59 | Admitting: Urology

## 2022-07-17 ENCOUNTER — Ambulatory Visit: Payer: Medicare Other | Admitting: Urology

## 2022-07-18 ENCOUNTER — Encounter: Payer: Self-pay | Admitting: Urology

## 2022-07-22 ENCOUNTER — Ambulatory Visit: Payer: 59

## 2022-08-08 ENCOUNTER — Ambulatory Visit: Payer: Medicare Other | Admitting: Oncology

## 2022-08-08 ENCOUNTER — Other Ambulatory Visit: Payer: Medicare Other

## 2022-08-15 ENCOUNTER — Inpatient Hospital Stay: Payer: 59 | Attending: Oncology | Admitting: Oncology

## 2022-08-15 ENCOUNTER — Encounter: Payer: Self-pay | Admitting: Oncology

## 2022-08-15 ENCOUNTER — Inpatient Hospital Stay: Payer: 59

## 2022-10-22 ENCOUNTER — Other Ambulatory Visit: Payer: Self-pay | Admitting: Specialist

## 2022-10-22 DIAGNOSIS — R911 Solitary pulmonary nodule: Secondary | ICD-10-CM

## 2022-10-22 DIAGNOSIS — J449 Chronic obstructive pulmonary disease, unspecified: Secondary | ICD-10-CM

## 2022-11-18 ENCOUNTER — Ambulatory Visit: Payer: 59

## 2022-12-26 ENCOUNTER — Encounter: Payer: Self-pay | Admitting: *Deleted

## 2023-01-29 ENCOUNTER — Encounter (INDEPENDENT_AMBULATORY_CARE_PROVIDER_SITE_OTHER): Payer: Self-pay

## 2023-02-14 ENCOUNTER — Other Ambulatory Visit: Payer: Self-pay

## 2023-02-14 ENCOUNTER — Emergency Department: Payer: 59

## 2023-02-14 ENCOUNTER — Inpatient Hospital Stay
Admission: EM | Admit: 2023-02-14 | Discharge: 2023-02-18 | DRG: 871 | Disposition: A | Discharge status: Home / Self Care | Payer: 59 | Attending: Hospitalist | Admitting: Hospitalist

## 2023-02-14 DIAGNOSIS — R0602 Shortness of breath: Secondary | ICD-10-CM

## 2023-02-14 DIAGNOSIS — E872 Acidosis, unspecified: Secondary | ICD-10-CM | POA: Diagnosis present

## 2023-02-14 DIAGNOSIS — J9601 Acute respiratory failure with hypoxia: Secondary | ICD-10-CM | POA: Diagnosis present

## 2023-02-14 DIAGNOSIS — J189 Pneumonia, unspecified organism: Secondary | ICD-10-CM | POA: Diagnosis present

## 2023-02-14 DIAGNOSIS — A419 Sepsis, unspecified organism: Principal | ICD-10-CM | POA: Diagnosis present

## 2023-02-14 DIAGNOSIS — N2581 Secondary hyperparathyroidism of renal origin: Secondary | ICD-10-CM | POA: Diagnosis present

## 2023-02-14 DIAGNOSIS — H9190 Unspecified hearing loss, unspecified ear: Secondary | ICD-10-CM | POA: Diagnosis present

## 2023-02-14 DIAGNOSIS — Z8546 Personal history of malignant neoplasm of prostate: Secondary | ICD-10-CM | POA: Diagnosis not present

## 2023-02-14 DIAGNOSIS — E785 Hyperlipidemia, unspecified: Secondary | ICD-10-CM | POA: Diagnosis present

## 2023-02-14 DIAGNOSIS — Z7982 Long term (current) use of aspirin: Secondary | ICD-10-CM | POA: Diagnosis not present

## 2023-02-14 DIAGNOSIS — Z992 Dependence on renal dialysis: Secondary | ICD-10-CM

## 2023-02-14 DIAGNOSIS — Z6826 Body mass index (BMI) 26.0-26.9, adult: Secondary | ICD-10-CM

## 2023-02-14 DIAGNOSIS — J440 Chronic obstructive pulmonary disease with acute lower respiratory infection: Secondary | ICD-10-CM | POA: Diagnosis present

## 2023-02-14 DIAGNOSIS — E440 Moderate protein-calorie malnutrition: Secondary | ICD-10-CM | POA: Diagnosis present

## 2023-02-14 DIAGNOSIS — D631 Anemia in chronic kidney disease: Secondary | ICD-10-CM | POA: Diagnosis present

## 2023-02-14 DIAGNOSIS — E875 Hyperkalemia: Secondary | ICD-10-CM | POA: Diagnosis not present

## 2023-02-14 DIAGNOSIS — Z1152 Encounter for screening for COVID-19: Secondary | ICD-10-CM

## 2023-02-14 DIAGNOSIS — I2510 Atherosclerotic heart disease of native coronary artery without angina pectoris: Secondary | ICD-10-CM | POA: Diagnosis present

## 2023-02-14 DIAGNOSIS — F17210 Nicotine dependence, cigarettes, uncomplicated: Secondary | ICD-10-CM | POA: Diagnosis present

## 2023-02-14 DIAGNOSIS — Z8249 Family history of ischemic heart disease and other diseases of the circulatory system: Secondary | ICD-10-CM

## 2023-02-14 DIAGNOSIS — M549 Dorsalgia, unspecified: Secondary | ICD-10-CM | POA: Diagnosis present

## 2023-02-14 DIAGNOSIS — K219 Gastro-esophageal reflux disease without esophagitis: Secondary | ICD-10-CM | POA: Diagnosis present

## 2023-02-14 DIAGNOSIS — G8929 Other chronic pain: Secondary | ICD-10-CM | POA: Diagnosis present

## 2023-02-14 DIAGNOSIS — D696 Thrombocytopenia, unspecified: Secondary | ICD-10-CM | POA: Diagnosis present

## 2023-02-14 DIAGNOSIS — Z8579 Personal history of other malignant neoplasms of lymphoid, hematopoietic and related tissues: Secondary | ICD-10-CM

## 2023-02-14 DIAGNOSIS — N186 End stage renal disease: Secondary | ICD-10-CM | POA: Diagnosis present

## 2023-02-14 DIAGNOSIS — J441 Chronic obstructive pulmonary disease with (acute) exacerbation: Principal | ICD-10-CM

## 2023-02-14 DIAGNOSIS — Z79899 Other long term (current) drug therapy: Secondary | ICD-10-CM

## 2023-02-14 LAB — CBC WITH DIFFERENTIAL/PLATELET
Abs Immature Granulocytes: 0.05 10*3/uL (ref 0.00–0.07)
Basophils Absolute: 0 10*3/uL (ref 0.0–0.1)
Basophils Relative: 0 %
Eosinophils Absolute: 0 10*3/uL (ref 0.0–0.5)
Eosinophils Relative: 0 %
HCT: 38.3 % — ABNORMAL LOW (ref 39.0–52.0)
Hemoglobin: 12.3 g/dL — ABNORMAL LOW (ref 13.0–17.0)
Immature Granulocytes: 0 %
Lymphocytes Relative: 12 %
Lymphs Abs: 1.5 10*3/uL (ref 0.7–4.0)
MCH: 31.4 pg (ref 26.0–34.0)
MCHC: 32.1 g/dL (ref 30.0–36.0)
MCV: 97.7 fL (ref 80.0–100.0)
Monocytes Absolute: 1.1 10*3/uL — ABNORMAL HIGH (ref 0.1–1.0)
Monocytes Relative: 8 %
Neutro Abs: 10.4 10*3/uL — ABNORMAL HIGH (ref 1.7–7.7)
Neutrophils Relative %: 80 %
Platelets: 173 10*3/uL (ref 150–400)
RBC: 3.92 MIL/uL — ABNORMAL LOW (ref 4.22–5.81)
RDW: 15.1 % (ref 11.5–15.5)
WBC: 13.1 10*3/uL — ABNORMAL HIGH (ref 4.0–10.5)
nRBC: 0 % (ref 0.0–0.2)

## 2023-02-14 LAB — COMPREHENSIVE METABOLIC PANEL
ALT: 70 U/L — ABNORMAL HIGH (ref 0–44)
AST: 56 U/L — ABNORMAL HIGH (ref 15–41)
Albumin: 3 g/dL — ABNORMAL LOW (ref 3.5–5.0)
Alkaline Phosphatase: 232 U/L — ABNORMAL HIGH (ref 38–126)
Anion gap: 16 — ABNORMAL HIGH (ref 5–15)
BUN: 85 mg/dL — ABNORMAL HIGH (ref 8–23)
CO2: 24 mmol/L (ref 22–32)
Calcium: 8.5 mg/dL — ABNORMAL LOW (ref 8.9–10.3)
Chloride: 97 mmol/L — ABNORMAL LOW (ref 98–111)
Creatinine, Ser: 7.63 mg/dL — ABNORMAL HIGH (ref 0.61–1.24)
GFR, Estimated: 7 mL/min — ABNORMAL LOW (ref >60)
Glucose, Bld: 119 mg/dL — ABNORMAL HIGH (ref 70–99)
Potassium: 4.1 mmol/L (ref 3.5–5.1)
Sodium: 137 mmol/L (ref 135–145)
Total Bilirubin: 0.9 mg/dL (ref 0.0–1.2)
Total Protein: 9.2 g/dL — ABNORMAL HIGH (ref 6.5–8.1)

## 2023-02-14 LAB — URINALYSIS, W/ REFLEX TO CULTURE (INFECTION SUSPECTED)
Bilirubin Urine: NEGATIVE
Glucose, UA: 50 mg/dL — AB
Ketones, ur: NEGATIVE mg/dL
Leukocytes,Ua: NEGATIVE
Nitrite: NEGATIVE
Protein, ur: 100 mg/dL — AB
Specific Gravity, Urine: 1.012 (ref 1.005–1.030)
pH: 6 (ref 5.0–8.0)

## 2023-02-14 LAB — PROTIME-INR
INR: 1.1 (ref 0.8–1.2)
Prothrombin Time: 14.6 s (ref 11.4–15.2)

## 2023-02-14 LAB — HIV ANTIBODY (ROUTINE TESTING W REFLEX): HIV Screen 4th Generation wRfx: NONREACTIVE

## 2023-02-14 LAB — RESP PANEL BY RT-PCR (RSV, FLU A&B, COVID)  RVPGX2
Influenza A by PCR: NEGATIVE
Influenza B by PCR: NEGATIVE
Resp Syncytial Virus by PCR: NEGATIVE
SARS Coronavirus 2 by RT PCR: NEGATIVE

## 2023-02-14 LAB — PHOSPHORUS: Phosphorus: 5.4 mg/dL — ABNORMAL HIGH (ref 2.5–4.6)

## 2023-02-14 LAB — TROPONIN I (HIGH SENSITIVITY)
Troponin I (High Sensitivity): 11 ng/L (ref <18)
Troponin I (High Sensitivity): 12 ng/L (ref <18)

## 2023-02-14 LAB — LACTIC ACID, PLASMA
Lactic Acid, Venous: 1.2 mmol/L (ref 0.5–1.9)
Lactic Acid, Venous: 1.2 mmol/L (ref 0.5–1.9)

## 2023-02-14 MED ORDER — TRAZODONE HCL 50 MG PO TABS
25.0000 mg | ORAL_TABLET | Freq: Every evening | ORAL | Status: DC | PRN
  Administered 2023-02-16 – 2023-02-17 (×2): 25 mg via ORAL
  Filled 2023-02-14 (×2): qty 1

## 2023-02-14 MED ORDER — METHYLPREDNISOLONE SODIUM SUCC 125 MG IJ SOLR
125.0000 mg | Freq: Once | INTRAMUSCULAR | Status: AC
  Administered 2023-02-14: 125 mg via INTRAVENOUS
  Filled 2023-02-14: qty 2

## 2023-02-14 MED ORDER — FUROSEMIDE 40 MG PO TABS
20.0000 mg | ORAL_TABLET | Freq: Two times a day (BID) | ORAL | Status: DC
  Administered 2023-02-14 (×2): 20 mg via ORAL
  Filled 2023-02-14 (×2): qty 1

## 2023-02-14 MED ORDER — OXYCODONE-ACETAMINOPHEN 5-325 MG PO TABS
1.0000 | ORAL_TABLET | Freq: Four times a day (QID) | ORAL | Status: DC | PRN
  Administered 2023-02-15 – 2023-02-18 (×6): 1 via ORAL
  Filled 2023-02-14 (×4): qty 1

## 2023-02-14 MED ORDER — SODIUM CHLORIDE 0.9 % IV SOLN
2.0000 g | INTRAVENOUS | Status: AC
  Administered 2023-02-14 – 2023-02-17 (×4): 2 g via INTRAVENOUS
  Filled 2023-02-14 (×5): qty 20

## 2023-02-14 MED ORDER — MAGNESIUM HYDROXIDE 400 MG/5ML PO SUSP: 30.0000 mL | Freq: Every day | ORAL | Status: DC | PRN

## 2023-02-14 MED ORDER — METHYLPREDNISOLONE SODIUM SUCC 40 MG IJ SOLR
40.0000 mg | Freq: Two times a day (BID) | INTRAMUSCULAR | Status: AC
  Administered 2023-02-14 (×2): 40 mg via INTRAVENOUS
  Filled 2023-02-14 (×2): qty 1

## 2023-02-14 MED ORDER — SODIUM CHLORIDE 0.9 % IV SOLN
500.0000 mg | INTRAVENOUS | Status: AC
  Administered 2023-02-14 – 2023-02-17 (×4): 500 mg via INTRAVENOUS
  Filled 2023-02-14 (×4): qty 5

## 2023-02-14 MED ORDER — HEPARIN SODIUM (PORCINE) 5000 UNIT/ML IJ SOLN
5000.0000 [IU] | Freq: Three times a day (TID) | INTRAMUSCULAR | Status: DC
  Administered 2023-02-14 – 2023-02-18 (×14): 5000 [IU] via SUBCUTANEOUS
  Filled 2023-02-14 (×14): qty 1

## 2023-02-14 MED ORDER — IPRATROPIUM-ALBUTEROL 0.5-2.5 (3) MG/3ML IN SOLN
9.0000 mL | Freq: Once | RESPIRATORY_TRACT | Status: AC
  Administered 2023-02-14: 9 mL via RESPIRATORY_TRACT
  Filled 2023-02-14: qty 3

## 2023-02-14 MED ORDER — SODIUM CHLORIDE 0.9 % IV BOLUS
500.0000 mL | Freq: Once | INTRAVENOUS | Status: AC
  Administered 2023-02-14: 500 mL via INTRAVENOUS

## 2023-02-14 MED ORDER — SODIUM CHLORIDE 0.9 % IV SOLN
500.0000 mg | Freq: Once | INTRAVENOUS | Status: AC
  Administered 2023-02-14: 500 mg via INTRAVENOUS
  Filled 2023-02-14: qty 5

## 2023-02-14 MED ORDER — SODIUM CHLORIDE 0.9 % IV SOLN
1.0000 g | Freq: Once | INTRAVENOUS | Status: AC
  Administered 2023-02-14: 1 g via INTRAVENOUS
  Filled 2023-02-14: qty 10

## 2023-02-14 MED ORDER — LACTATED RINGERS IV SOLN
150.0000 mL/h | INTRAVENOUS | Status: DC
  Administered 2023-02-14: 150 mL/h via INTRAVENOUS

## 2023-02-14 MED ORDER — IPRATROPIUM-ALBUTEROL 0.5-2.5 (3) MG/3ML IN SOLN
3.0000 mL | Freq: Four times a day (QID) | RESPIRATORY_TRACT | Status: DC
Start: 2023-02-14 — End: 2023-02-16
  Administered 2023-02-14 – 2023-02-16 (×9): 3 mL via RESPIRATORY_TRACT
  Filled 2023-02-14 (×9): qty 3

## 2023-02-14 MED ORDER — IPRATROPIUM-ALBUTEROL 0.5-2.5 (3) MG/3ML IN SOLN: 3.0000 mL | RESPIRATORY_TRACT | Status: DC | PRN

## 2023-02-14 MED ORDER — ROPINIROLE HCL 0.25 MG PO TABS
0.2500 mg | ORAL_TABLET | Freq: Every evening | ORAL | Status: DC
  Administered 2023-02-14 – 2023-02-17 (×4): 0.25 mg via ORAL
  Filled 2023-02-14 (×6): qty 1

## 2023-02-14 MED ORDER — ADULT MULTIVITAMIN W/MINERALS CH
1.0000 | ORAL_TABLET | Freq: Every day | ORAL | Status: DC
  Administered 2023-02-14 – 2023-02-16 (×3): 1 via ORAL
  Filled 2023-02-14 (×3): qty 1

## 2023-02-14 MED ORDER — ENOXAPARIN SODIUM 40 MG/0.4ML IJ SOSY: 40.0000 mg | PREFILLED_SYRINGE | INTRAMUSCULAR | Status: DC

## 2023-02-14 MED ORDER — ONDANSETRON HCL 4 MG PO TABS: 4.0000 mg | ORAL_TABLET | Freq: Four times a day (QID) | ORAL | Status: DC | PRN

## 2023-02-14 MED ORDER — ACETAMINOPHEN 325 MG PO TABS: 650.0000 mg | ORAL_TABLET | Freq: Four times a day (QID) | ORAL | Status: DC | PRN

## 2023-02-14 MED ORDER — MAGNESIUM SULFATE 2 GM/50ML IV SOLN
2.0000 g | Freq: Once | INTRAVENOUS | Status: AC
  Administered 2023-02-14: 2 g via INTRAVENOUS
  Filled 2023-02-14: qty 50

## 2023-02-14 MED ORDER — RENA-VITE PO TABS
1.0000 | ORAL_TABLET | Freq: Every day | ORAL | Status: DC
  Administered 2023-02-14 – 2023-02-17 (×4): 1 via ORAL
  Filled 2023-02-14 (×4): qty 1

## 2023-02-14 MED ORDER — ACETAMINOPHEN 650 MG RE SUPP: 650.0000 mg | Freq: Four times a day (QID) | RECTAL | Status: DC | PRN

## 2023-02-14 MED ORDER — PREDNISONE 20 MG PO TABS
40.0000 mg | ORAL_TABLET | Freq: Every day | ORAL | Status: AC
  Administered 2023-02-15 – 2023-02-18 (×4): 40 mg via ORAL
  Filled 2023-02-14 (×4): qty 2

## 2023-02-14 MED ORDER — VITAMIN D 25 MCG (1000 UNIT) PO TABS
2000.0000 [IU] | ORAL_TABLET | Freq: Every day | ORAL | Status: DC
  Administered 2023-02-14 – 2023-02-17 (×4): 2000 [IU] via ORAL
  Filled 2023-02-14 (×4): qty 2

## 2023-02-14 MED ORDER — ASPIRIN 81 MG PO TBEC
81.0000 mg | DELAYED_RELEASE_TABLET | Freq: Every day | ORAL | Status: DC
  Administered 2023-02-14 – 2023-02-18 (×5): 81 mg via ORAL
  Filled 2023-02-14 (×5): qty 1

## 2023-02-14 MED ORDER — ONDANSETRON HCL 4 MG/2ML IJ SOLN: 4.0000 mg | Freq: Four times a day (QID) | INTRAMUSCULAR | Status: DC | PRN

## 2023-02-14 NOTE — ED Triage Notes (Signed)
 Pt to ed from home via [facility] for SOB and CP x 10 hours. Pt received dialysis today and felt worse. Pt has felt sick the last few days.   EMS vitals  149/80 T 101.1 RR 26 20G 2 duo nebs by ems.

## 2023-02-14 NOTE — Assessment & Plan Note (Signed)
-   We will continue aspirin as well as statin therapy. 

## 2023-02-14 NOTE — ED Notes (Signed)
 [employer] will not print labels, chart label used for UA.

## 2023-02-14 NOTE — Progress Notes (Signed)
 [facility]  ROUNDING NOTE   Subjective:   [name] is a 68yo male PMH of ESRD on dialysis MWF at [facility] [address] MWF, multiple myeloma, chronic back pain, CKD, COPD here with complaints of worsening shortness of breath and generalized weakness.  CXR shows left lower lobe airspace opacity concerning for pneumonia with stable right lower upper lobe pulmonary nodule.  He receives dialysis at [facility] [address] M/W/Fr and is followed by [facility] providers  Updates: Currently on 2L, patient reports feeling better today. Endorses poor appetite and has been feeling bad for a while now. Patient report he completed dialysis yesterday with no issues.    Objective:  Vital signs in last 24 hours:  Temp:  [98.2 F (36.8 C)-99.1 F (37.3 C)] 98.2 F (36.8 C) (02/08 0637) Pulse Rate:  [65-120] 84 (02/08 1200) Resp:  [20-26] 20 (02/08 1200) BP: (109-135)/(58-78) 129/68 (02/08 1200) SpO2:  [92 %-100 %] 96 % (02/08 1200) Weight:  [83.9 kg] 83.9 kg (02/08 0010)  Weight change:  Filed Weights   02/14/23 0010  Weight: 83.9 kg    Intake/Output: I/O last 3 completed shifts: In: 1300.8 [IV Piggyback:1300.8] Out: -    Intake/Output this shift:  Total I/O In: 300 [I.V.:300] Out: 300 [Urine:300]  Physical Exam: General: NAD,   Head: Normocephalic, atraumatic. Moist oral mucosal membranes  Eyes: Anicteric, PERRL  Neck: Supple, trachea midline  Lungs:  Clear to auscultation  Heart: Regular rate and rhythm  Abdomen:  Soft, nontender,   Extremities:  No peripheral edema.  Neurologic: Nonfocal, moving all four extremities  Skin: No lesions  Access: Left AVF, +thrill and +bruit    Basic Metabolic Panel: Recent Labs  Lab 02/14/23 0017  NA 137  K 4.1  CL 97*  CO2 24  GLUCOSE 119*  BUN 85*  CREATININE 7.63*  CALCIUM  8.5*    Liver Function Tests: Recent Labs  Lab 02/14/23 0017  AST 56*  ALT 70*  ALKPHOS 232*  BILITOT 0.9  PROT 9.2*  ALBUMIN 3.0*   No  results for input(s): LIPASE, AMYLASE in the last 168 hours. No results for input(s): AMMONIA in the last 168 hours.  CBC: Recent Labs  Lab 02/14/23 0017  WBC 13.1*  NEUTROABS 10.4*  HGB 12.3*  HCT 38.3*  MCV 97.7  PLT 173    Cardiac Enzymes: No results for input(s): CKTOTAL, CKMB, CKMBINDEX, TROPONINI in the last 168 hours.  BNP: Invalid input(s): POCBNP  CBG: No results for input(s): GLUCAP in the last 168 hours.  Microbiology: Results for orders placed or performed during the hospital encounter of 02/14/23  Resp panel by RT-PCR (RSV, Flu A&B, Covid) Anterior Nasal Swab     Status: None   Collection Time: 02/14/23 12:17 AM   Specimen: Anterior Nasal Swab  Result Value Ref Range Status   SARS Coronavirus 2 by RT PCR NEGATIVE NEGATIVE Final    Comment: (NOTE) SARS-CoV-2 target nucleic acids are NOT DETECTED.  The SARS-CoV-2 RNA is generally detectable in upper respiratory specimens during the acute phase of infection. The lowest concentration of SARS-CoV-2 viral copies this assay can detect is 138 copies/mL. A negative result does not preclude SARS-Cov-2 infection and should not be used as the sole basis for treatment or other patient management decisions. A negative result may occur with  improper specimen collection/handling, submission of specimen other than nasopharyngeal swab, presence of viral mutation(s) within the areas targeted by this assay, and inadequate number of viral copies(<138 copies/mL). A negative  result must be combined with clinical observations, patient history, and epidemiological information. The expected result is Negative.  Fact Sheet for Patients:  bloggercourse.com  Fact Sheet for Healthcare Providers:  seriousbroker.it  This test is no t yet approved or cleared by the [place]  FDA and  has been authorized for detection and/or diagnosis of SARS-CoV-2 by FDA under  an Emergency Use Authorization (EUA). This EUA will remain  in effect (meaning this test can be used) for the duration of the COVID-19 declaration under Section 564(b)(1) of the Act, 21 U.S.C.section 360bbb-3(b)(1), unless the authorization is terminated  or revoked sooner.       Influenza A by PCR NEGATIVE NEGATIVE Final   Influenza B by PCR NEGATIVE NEGATIVE Final    Comment: (NOTE) The Xpert Xpress SARS-CoV-2/FLU/RSV plus assay is intended as an aid in the diagnosis of influenza from Nasopharyngeal swab specimens and should not be used as a sole basis for treatment. Nasal washings and aspirates are unacceptable for Xpert Xpress SARS-CoV-2/FLU/RSV testing.  Fact Sheet for Patients: bloggercourse.com  Fact Sheet for Healthcare Providers: seriousbroker.it  This test is not yet approved or cleared by the [place]  FDA and has been authorized for detection and/or diagnosis of SARS-CoV-2 by FDA under an Emergency Use Authorization (EUA). This EUA will remain in effect (meaning this test can be used) for the duration of the COVID-19 declaration under Section 564(b)(1) of the Act, 21 U.S.C. section 360bbb-3(b)(1), unless the authorization is terminated or revoked.     Resp Syncytial Virus by PCR NEGATIVE NEGATIVE Final    Comment: (NOTE) Fact Sheet for Patients: bloggercourse.com  Fact Sheet for Healthcare Providers: seriousbroker.it  This test is not yet approved or cleared by the [place]  FDA and has been authorized for detection and/or diagnosis of SARS-CoV-2 by FDA under an Emergency Use Authorization (EUA). This EUA will remain in effect (meaning this test can be used) for the duration of the COVID-19 declaration under Section 564(b)(1) of the Act, 21 U.S.C. section 360bbb-3(b)(1), unless the authorization is terminated or revoked.  Performed at [facility], [address] [place], [place] [address]   Culture, blood (Routine x 2)     Status: None (Preliminary result)   Collection Time: 02/14/23 12:30 AM   Specimen: BLOOD RIGHT ARM  Result Value Ref Range Status   Specimen Description BLOOD RIGHT ARM  Final   Special Requests   Final    BOTTLES DRAWN AEROBIC AND ANAEROBIC Blood Culture results may not be optimal due to an inadequate volume of blood received in culture bottles   Culture   Final    NO GROWTH < 12 HOURS Performed at [facility], [address], [place], [place] [address]    Report Status PENDING  Incomplete  Culture, blood (Routine x 2)     Status: None (Preliminary result)   Collection Time: 02/14/23 12:46 AM   Specimen: BLOOD  Result Value Ref Range Status   Specimen Description BLOOD BLOOD RIGHT ARM  Final   Special Requests   Final    BOTTLES DRAWN AEROBIC AND ANAEROBIC Blood Culture adequate volume   Culture   Final    NO GROWTH < 12 HOURS Performed at [facility], [address] [place], [place] [address]    Report Status PENDING  Incomplete    Coagulation Studies: Recent Labs    02/14/23 0017  LABPROT 14.6  INR 1.1    Urinalysis: Recent Labs    02/14/23 0848  COLORURINE YELLOW*  LABSPEC 1.012  PHURINE 6.0  GLUCOSEU 50*  HGBUR MODERATE*  BILIRUBINUR NEGATIVE  KETONESUR NEGATIVE  PROTEINUR 100*  NITRITE NEGATIVE  LEUKOCYTESUR NEGATIVE      Imaging: DG Chest Port 1 View Result Date: 02/14/2023 CLINICAL DATA:  Shortness of breath, chest pain EXAM: PORTABLE CHEST 1 VIEW COMPARISON:  01/14/2022 FINDINGS: Heart and mediastinal contours within normal limits. Left basilar airspace opacity. Right upper lobe nodule again noted, similar to prior study. No effusions or acute bony abnormality. IMPRESSION: Left lower lobe airspace opacity concerning for pneumonia. Stable right upper lobe pulmonary nodule. Electronically Signed   By: Franky Crease M.D.   On: 02/14/2023 01:02      Medications:    azithromycin      cefTRIAXone  (ROCEPHIN )  IV      aspirin  EC  81 mg Oral Daily   cholecalciferol   2,000 Units Oral QHS   furosemide   20 mg Oral BID   heparin  injection (subcutaneous)  5,000 Units Subcutaneous Q8H   ipratropium-albuterol   3 mL Nebulization QID   methylPREDNISolone  (SOLU-MEDROL ) injection  40 mg Intravenous Q12H   Followed by   NOREEN ON 02/15/2023] predniSONE   40 mg Oral Q breakfast   multivitamin  1 tablet Oral QHS   multivitamin with minerals  1 tablet Oral Daily   acetaminophen  **OR** acetaminophen , ipratropium-albuterol , magnesium  hydroxide, ondansetron  **OR** ondansetron  (ZOFRAN ) IV, oxyCODONE -acetaminophen , traZODone   Assessment/ Plan:   [name] is a 68yo male PMH of ESRD on dialysis MWF at [facility] MWF, multiple myeloma, chronic back pain, CKD, COPD here with complaints of worsening shortness of breath and generalized weakness.  CXR shows left lower lobe airspace opacity concerning for pneumonia with stable right lower upper lobe pulmonary nodule.   ESRD on dialysis MWF      Spoke with clinic and patient has history of not tolerating fluid removal due to cramping and frequently only needs clearance. Patient on furosemide  20mg  BID with good urine output. Last treatment 2/7. Plan for dialysis on Monday as outpatient schedule.  2. Sepsis due to pneumonia  Patient on IV Rocephin  and Zithromax , mucolytic therapy and cultures pending.   3. Anemia of chronic kidney disease  HgB 12.3 Will monitor for changes  4. Secondary hyperparathyroidism   2/8 Ca 8.5 Phos and PTH collected     LOS: 0 Khloei Spiker SHAUNNA Dines 2/8/202512:27 PM

## 2023-02-14 NOTE — Assessment & Plan Note (Signed)
-   Nephrology consult will be obtained. - I notified Dr. Wynelle Link about the patient.

## 2023-02-14 NOTE — ED Provider Notes (Signed)
 [facility] Provider Note    Event Date/Time   First MD Initiated Contact with Patient 02/14/23 0023     (approximate)   History   Shortness of Breath   HPI  Nathaniel [name] is a 68 y.o. male   Past medical history of end-stage renal disease with hemodialysis on Monday/Wednesday/Friday, multiple myeloma, chronic back pain, CKD, COPD, here with 1 month of progressively worsening shortness of breath and productive sputum as well as generalized weakness.  He is short of breath.  He had dialysis today.  He denies any swelling or increased fluid suspected by patient.  He has been compliant with all dialysis session.    Independent Historian contributed to assessment above: His spouse is at bedside to corroborate information past medical history as above    Physical Exam   Triage Vital Signs: ED Triage Vitals  Encounter Vitals Group     BP 02/14/23 0009 (!) 124/58     Systolic BP Percentile --      Diastolic BP Percentile --      Pulse Rate 02/14/23 0009 65     Resp 02/14/23 0009 (!) 26     Temp 02/14/23 0010 99.1 F (37.3 C)     Temp Source 02/14/23 0010 Oral     SpO2 02/14/23 0009 92 %     Weight 02/14/23 0010 185 lb (83.9 kg)     Height 02/14/23 0010 6' (1.829 m)     Head Circumference --      Peak Flow --      Pain Score 02/14/23 0009 7     Pain Loc --      Pain Education --      Exclude from Growth Chart --     Most recent vital signs: Vitals:   02/14/23 0100 02/14/23 0130  BP: 135/78 121/66  Pulse: (!) 116 (!) 109  Resp:    Temp:    SpO2: 96% 100%    General: Awake, no distress.  CV:  Good peripheral perfusion.  Resp:  Normal effort.  Abd:  No distention. Other:  Diffuse wheezing throughout all lung fields, tachycardic, tachypneic and normotensive.  Appears slightly dehydrated with dry lips and mucous membrane.  Nondistended abdomen.  No peripheral edema noted.  ED Results / Procedures / Treatments   Labs (all labs ordered  are listed, but only abnormal results are displayed) Labs Reviewed  COMPREHENSIVE METABOLIC PANEL - Abnormal; Notable for the following components:      Result Value   Chloride 97 (*)    Glucose, Bld 119 (*)    BUN 85 (*)    Creatinine, Ser 7.63 (*)    Calcium  8.5 (*)    Total Protein 9.2 (*)    Albumin 3.0 (*)    AST 56 (*)    ALT 70 (*)    Alkaline Phosphatase 232 (*)    GFR, Estimated 7 (*)    Anion gap 16 (*)    All other components within normal limits  CBC WITH DIFFERENTIAL/PLATELET - Abnormal; Notable for the following components:   WBC 13.1 (*)    RBC 3.92 (*)    Hemoglobin 12.3 (*)    HCT 38.3 (*)    Neutro Abs 10.4 (*)    Monocytes Absolute 1.1 (*)    All other components within normal limits  RESP PANEL BY RT-PCR (RSV, FLU A&B, COVID)  RVPGX2  CULTURE, BLOOD (ROUTINE X 2)  CULTURE, BLOOD (ROUTINE X 2)  LACTIC  ACID, PLASMA  PROTIME-INR  LACTIC ACID, PLASMA  URINALYSIS, W/ REFLEX TO CULTURE (INFECTION SUSPECTED)  TROPONIN I (HIGH SENSITIVITY)     I ordered and reviewed the above labs they are notable for white blood cell count elevation 13  EKG  ED ECG REPORT I, Ginnie Shams, the attending physician, personally viewed and interpreted this ECG.   Date: 02/14/2023  EKG Time: 0023  Rate: 119  Rhythm: sinus tachycardia  Axis: nl  Intervals:none  ST&T Change: no stemi    RADIOLOGY I independently reviewed and interpreted chest x-ray and see left lower lobe consolidation considered for pneumonia I also reviewed radiologist's formal read.   PROCEDURES:  Critical Care performed: Yes, see critical care procedure note(s)  .Critical Care  Performed by: Shams Ginnie, MD Authorized by: Shams Ginnie, MD   Critical care provider statement:    Critical care time (minutes):  30   Critical care was time spent personally by me on the following activities:  Development of treatment plan with patient or surrogate, discussions with consultants, evaluation of  patient's response to treatment, examination of patient, ordering and review of laboratory studies, ordering and review of radiographic studies, ordering and performing treatments and interventions, pulse oximetry, re-evaluation of patient's condition and review of old charts    MEDICATIONS ORDERED IN ED: Medications  azithromycin  (ZITHROMAX ) 500 mg in sodium chloride  0.9 % 250 mL IVPB (500 mg Intravenous New Bag/Given 02/14/23 0109)  magnesium  sulfate IVPB 2 g 50 mL (2 g Intravenous New Bag/Given 02/14/23 0119)  sodium chloride  0.9 % bolus 500 mL (has no administration in time range)  sodium chloride  0.9 % bolus 500 mL (0 mLs Intravenous Stopped 02/14/23 0131)  cefTRIAXone  (ROCEPHIN ) 1 g in sodium chloride  0.9 % 100 mL IVPB (0 g Intravenous Stopped 02/14/23 0108)  ipratropium-albuterol  (DUONEB) 0.5-2.5 (3) MG/3ML nebulizer solution 9 mL (9 mLs Nebulization Given 02/14/23 0116)  methylPREDNISolone  sodium succinate (SOLU-MEDROL ) 125 mg/2 mL injection 125 mg (125 mg Intravenous Given 02/14/23 0117)    External physician / consultants:  I spoke with hospital medicine for admission and regarding care plan for this patient.   IMPRESSION / MDM / ASSESSMENT AND PLAN / ED COURSE  I reviewed the triage vital signs and the nursing notes.                                Patient's presentation is most consistent with acute presentation with potential threat to life or bodily function.  Differential diagnosis includes, but is not limited to, bacterial pneumonia, viral URI, sepsis   The patient is on the cardiac monitor to evaluate for evidence of arrhythmia and/or significant heart rate changes.  MDM: Respiratory infectious symptoms for several weeks worsening with evidence of a pneumonia on x-ray, as well as COPD on exam with wheezing throughout.  New oxygen requirement 2 L nasal cannula to keep oxygen saturations in the low to mid 90s.  Initial treatment with antibiotics, nebulizer treatment, Solu-Medrol ,  magnesium  have been effective and that his breathing is subjectively better and he appears more comfortable.  Given Coverage for pneumonia as well.  Concern for sepsis given vital sign abnormalities so blood cultures and lactic sent.  Mildly elevated lactic we will continue to trend.  Started with 500 cc normal saline given his history of end-stage renal disease on dialysis but I think he can tolerate given his respiratory symptoms are less likely due to fluid overload but instead  COPD exacerbation and infection.  I will hold on giving him the entire 30 cc/kg for sepsis fluid bolus given his end-stage renal disease.  I have added on an additional 500 cc normal saline after the initial fluid bolus and will continue fluids as tolerated.         FINAL CLINICAL IMPRESSION(S) / ED DIAGNOSES   Final diagnoses:  COPD exacerbation (HCC)  SOB (shortness of breath)  Community acquired pneumonia of left lower lobe of lung     Rx / DC Orders   ED Discharge Orders     None        Note:  This document was prepared using Dragon voice recognition software and may include unintentional dictation errors.    Cyrena Mylar, MD 02/14/23 [phone]

## 2023-02-14 NOTE — ED Notes (Signed)
 Pt working with PT

## 2023-02-14 NOTE — ED Notes (Signed)
 Pt resting comfortably

## 2023-02-14 NOTE — Assessment & Plan Note (Addendum)
-   We will place the patient IV steroid therapy with IV Solu-Medrol as well as nebulized bronchodilator therapy with duonebs q.i.d. and q.4 hours p.r.n.Marland Kitchen - Mucolytic therapy will be provided with Mucinex and antibiotic therapy with IV Rocephin and Zithromax. - O2 protocol will be followed. - We will hold off long-acting beta agonist.

## 2023-02-14 NOTE — Evaluation (Signed)
 Occupational Therapy Evaluation Patient Details Name: Nathaniel [name] MRN: [id number] DOB: 1955/09/04 Today's Date: 02/14/2023   History of Present Illness 68 y.o. male with medical history significant for ESRD on HD on MWF, multiple myeloma, chronic back pain, CKD, COPD, GERD, dyslipidemia and tobacco abuse, presented to the emergency room with acute onset of worsening dyspnea over the last month with associated cough productive of yellowish sputum as well as wheezing and generalized weakness.   Respiratory panel came back negative   Portable chest x-ray showed left lower lobe airspace opacity concerning for pneumonia with stable right upper lobe pulmonary nodule.   Clinical Impression   Pt presents with generalized weakness, high level of fatigue, and mild SOB. He reports that he developed cough, fatigue, and weakness ~ 3 weeks ago. Prior to that time, he states he was physically active, going to gym, performing carpentry work, swimming. Pt reports no difficulties managing any B/IADL, states he does not need ongoing OT services. Provided educ on importance of moving and resuming physical activity as able, w/ pt verbalizing understanding and stating this is his goal. Will sign off for OT; pt could benefit, however, from mobility services while hospitalized, to help address increased weakness following several weeks of reduced activity.      If plan is discharge home, recommend the following:      Functional Status Assessment  Patient has had a recent decline in their functional status and demonstrates the ability to make significant improvements in function in a reasonable and predictable amount of time.  Equipment Recommendations       Recommendations for Other Services       Precautions / Restrictions Precautions Precautions: Fall Restrictions Weight Bearing Restrictions Per Provider Order: No      Mobility Bed Mobility Overal bed mobility: Modified Independent                   Transfers Overall transfer level: Modified independent                        Balance Overall balance assessment: Modified Independent                                         ADL either performed or assessed with clinical judgement   ADL                                               Vision         Perception         Praxis         Pertinent Vitals/Pain Pain Assessment Pain Assessment: No/denies pain     Extremity/Trunk Assessment Upper Extremity Assessment Upper Extremity Assessment: Generalized weakness   Lower Extremity Assessment Lower Extremity Assessment: Generalized weakness       Communication Communication Communication: No apparent difficulties   Cognition Arousal: Alert Behavior During Therapy: WFL for tasks assessed/performed                                         General Comments       Exercises Other Exercises Other Exercises: Educ re: role of  OT, importance of return to physical activity   Shoulder Instructions      Home Living Family/patient expects to be discharged to:: Private residence Living Arrangements: Spouse/significant other Available Help at Discharge: Family;Available 24 hours/day Type of Home: House       Home Layout: One level               Home Equipment: None          Prior Functioning/Environment Prior Level of Function : Independent/Modified Independent             Mobility Comments: ambulates w/o AD, denies falls ADLs Comments: 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, going regularly to gym, swimming, doing carpentry work        OT Problem List: Decreased strength;Decreased activity tolerance      OT Treatment/Interventions:      OT Goals(Current goals can be found in the care plan section) Acute Rehab OT Goals Patient Stated Goal: get back to being active ASAP OT Goal Formulation: With patient Time For Goal Achievement:  02/28/23 Potential to Achieve Goals: Good  OT Frequency:      Co-evaluation              AM-PAC OT 6 Clicks Daily Activity     Outcome Measure Help from another person eating meals?: None Help from another person taking care of personal grooming?: None Help from another person toileting, which includes using toliet, bedpan, or urinal?: A Little Help from another person bathing (including washing, rinsing, drying)?: A Little Help from another person to put on and taking off regular upper body clothing?: None Help from another person to put on and taking off regular lower body clothing?: A Little 6 Click Score: 21   End of Session    Activity Tolerance: Patient tolerated treatment well Patient left: in bed  OT Visit Diagnosis: Muscle weakness (generalized) (M62.81)                Time: 8949-8887 OT Time Calculation (min): 22 min Charges:  OT General Charges $OT Visit: 1 Visit OT Evaluation $OT Eval Low Complexity: 1 Low Suzen Hock, PhD, MS, OTR/L 02/14/23, 12:37 PM

## 2023-02-14 NOTE — Progress Notes (Signed)
 [facility]  PROGRESS NOTE  Nathaniel [name] FMW:[id number] DOB: [birth year] DOA: 02/14/2023 [facility]CP: [facility]   Brief HPI:   68 y.o. male with medical history significant for ESRD on HD on MWF, multiple myeloma, chronic back pain, CKD, COPD, GERD, dyslipidemia and tobacco abuse, presented to the emergency room with acute onset of worsening dyspnea over the last month with associated cough productive of yellowish sputum as well as wheezing and generalized weakness.  Respiratory panel came back negative  Portable chest x-ray showed left lower lobe airspace opacity concerning for pneumonia with stable right upper lobe pulmonary nodule.    Assessment/Plan:    Sepsis due to pneumonia (HCC) -Presented with tachycardia, tachypnea, leukocytosis -Sepsis physiology has resolved - Will continue antibiotic therapy with IV Rocephin  and Zithromax . - Mucolytic therapy be provided as well as duo nebs q.i.d. and q.4 hours p.r.n. - We will follow blood cultures.      COPD with acute exacerbation (HCC) -Continue Solu-Medrol , bronchodilator every 6 hours -Continue Mucinex, Rocephin , Zithromax     Acute respiratory failure with hypoxia (HCC) - Management as above as this is related to #2 and #1. - O2 protocol will be followed.   End-stage renal disease on hemodialysis [facility]) - Nephrology consulted, plan for hemodialysis on Monday    Dyslipidemia - We will continue statin therapy.   Coronary artery disease - We will continue aspirin  as well as statin therapy.   Transaminitis -Unclear etiology -Hold statin for now  Medications     aspirin  EC  81 mg Oral Daily   cholecalciferol   2,000 Units Oral QHS   furosemide   20 mg Oral BID   heparin  injection (subcutaneous)  5,000 Units Subcutaneous Q8H   ipratropium-albuterol   3 mL Nebulization QID   methylPREDNISolone  (SOLU-MEDROL ) injection  40 mg Intravenous Q12H   Followed by   Nathaniel [name] ON 02/15/2023] predniSONE   40 mg  Oral Q breakfast   multivitamin  1 tablet Oral QHS   multivitamin with minerals  1 tablet Oral Daily     Data Reviewed:   CBG:  No results for input(s): GLUCAP in the last 168 hours.  SpO2: 97 % O2 Flow Rate (L/min): 2 L/min    Vitals:   02/14/23 0600 02/14/23 0630 02/14/23 0637 02/14/23 0830  BP: 113/62 (!) 110/59  120/72  Pulse: 93 90  89  Resp:   (!) 22 20  Temp:   98.2 F (36.8 C)   TempSrc:   Oral   SpO2: 96% 98%  97%  Weight:      Height:          Data Reviewed:  Basic Metabolic Panel: Recent Labs  Lab 02/14/23 0017  NA 137  K 4.1  CL 97*  CO2 24  GLUCOSE 119*  BUN 85*  CREATININE 7.63*  CALCIUM  8.5*    CBC: Recent Labs  Lab 02/14/23 0017  WBC 13.1*  NEUTROABS 10.4*  HGB 12.3*  HCT 38.3*  MCV 97.7  PLT 173    LFT Recent Labs  Lab 02/14/23 0017  AST 56*  ALT 70*  ALKPHOS 232*  BILITOT 0.9  PROT 9.2*  ALBUMIN 3.0*     Antibiotics: Anti-infectives (From admission, onward)    Start     Dose/Rate Route Frequency Ordered Stop   02/14/23 2300  azithromycin  (ZITHROMAX ) 500 mg in sodium chloride  0.9 % 250 mL IVPB        500 mg 250 mL/hr over 60 Minutes Intravenous Every 24 hours 02/14/23 0520  02/18/23 2259   02/14/23 2200  cefTRIAXone  (ROCEPHIN ) 2 g in sodium chloride  0.9 % 100 mL IVPB        2 g 200 mL/hr over 30 Minutes Intravenous Every 24 hours 02/14/23 0520 02/18/23 2159   02/14/23 0100  cefTRIAXone  (ROCEPHIN ) 1 g in sodium chloride  0.9 % 100 mL IVPB        1 g 200 mL/hr over 30 Minutes Intravenous  Once 02/14/23 0045 02/14/23 0108   02/14/23 0100  azithromycin  (ZITHROMAX ) 500 mg in sodium chloride  0.9 % 250 mL IVPB        500 mg 250 mL/hr over 60 Minutes Intravenous  Once 02/14/23 0045 02/14/23 0212        DVT prophylaxis: Heparin   Code Status: Full code  Family Communication: No family at bedside   CONSULTS nephrology   Subjective   Feels better this morning.  Denies shortness of breath.  Has been coughing up  phlegm   Objective    Physical Examination:   General-appears in no acute distress Heart-S1-S2, regular, no murmur auscultated Lungs-clear to auscultation bilaterally, no wheezing or crackles auscultated Abdomen-soft, nontender, no organomegaly Extremities-no edema in the lower extremities Neuro-alert, oriented x3, no focal deficit noted  Status is: Inpatient:             Nathaniel [name]   [facility] If 7PM-7AM, please contact night-coverage at [email or web address], Office  [phone]   02/14/2023, 8:43 AM  LOS: 0 days

## 2023-02-14 NOTE — Assessment & Plan Note (Signed)
-   The patient will be admitted to a medical telemetry bed. - Will continue antibiotic therapy with IV Rocephin  and Zithromax . - Mucolytic therapy be provided as well as duo nebs q.i.d. and q.4 hours p.r.n. - We will follow blood cultures. - The patient will be hydrated with IV normal saline. - Sepsis is manifested by leukocytosis, tachycardia and tachypnea

## 2023-02-14 NOTE — Evaluation (Signed)
 Physical Therapy Evaluation Patient Details Name: Nathaniel [name] MRN: [id number] DOB: [birth year] Today's Date: 02/14/2023  History of Present Illness  68 y.o. male with medical history significant for ESRD on HD on MWF, multiple myeloma, chronic back pain, CKD, COPD, GERD, dyslipidemia and tobacco abuse, presented to the emergency room with acute onset of worsening dyspnea over the last month with associated cough productive of yellowish sputum as well as wheezing and generalized weakness.   Respiratory panel came back negative   Portable chest x-ray showed left lower lobe airspace opacity concerning for pneumonia with stable right upper lobe pulmonary nodule.  Clinical Impression  68 yo Male reports increased shortness of breath and fatigue over last few weeks. He was very active prior to admittance, going to gym/working. He lives at home with his wife. Patient was independent in walking and all ADLs. He is mod I for bed mobility and transfers. He did require CGA for steadying when walking in hallway. This could be due to walking on hard surface in non-skid socks instead of supportive shoes and/or from fatigue. Patient has been on 2L O2 in ED. He was not on supplemental O2 prior to admittance. PT had patient trial walking without supplemental O2 while monitoring vitals. Initially Spo2 levels were 96% while in room. After walking 100 feet, Spo2 dropped to 89% with increased dyspnea on exertion. PT instructed patient to return to room. While walking back to room, SPo2 increased to 92% on room air but dyspnea continued. Upon return to room, PT re-applied 2L O2 nasal cannula and SPo2 rebounded to >96% within 30 seconds. Patient reports feeling better but continues to have dyspnea on exertion. He demonstrates good balance and safety awareness. He does not demonstrate need for acute skilled PT intervention at this time. Recommend he slowly increase activity to tolerance while in hospital with mobility team.  Patient agreeable.      If plan is discharge home, recommend the following: Assistance with cooking/housework;Help with stairs or ramp for entrance   Can travel by private vehicle        Equipment Recommendations None recommended by PT  Recommendations for Other Services       Functional Status Assessment Patient has had a recent decline in their functional status and demonstrates the ability to make significant improvements in function in a reasonable and predictable amount of time.     Precautions / Restrictions Precautions Precautions: Fall Restrictions Weight Bearing Restrictions Per Provider Order: No      Mobility  Bed Mobility Overal bed mobility: Modified Independent                  Transfers Overall transfer level: Modified independent                      Ambulation/Gait Ambulation/Gait assistance: Contact guard assist Gait Distance (Feet): 200 Feet Assistive device: None   Gait velocity: slow     General Gait Details: ambulates with reciprocal gait pattern, narrow base of support, slower gait speed. Pt did have a few episodes of unsteadiness requiring CGA for safety but was able to self correct;  Stairs            Wheelchair Mobility     Tilt Bed    Modified Rankin (Stroke Patients Only)       Balance Overall balance assessment: Mild deficits observed, not formally tested  Pertinent Vitals/Pain Pain Assessment Pain Assessment: No/denies pain    Home Living Family/patient expects to be discharged to:: Private residence Living Arrangements: Spouse/significant other Available Help at Discharge: Family;Available 24 hours/day Type of Home: House         Home Layout: One level Home Equipment: None      Prior Function Prior Level of Function : Independent/Modified Independent             Mobility Comments: ambulates w/o AD, denies falls ADLs  Comments: 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, going regularly to gym, swimming, doing carpentry work     Extremity/Trunk Assessment   Upper Extremity Assessment Upper Extremity Assessment: Generalized weakness    Lower Extremity Assessment Lower Extremity Assessment: Generalized weakness       Communication   Communication Communication: No apparent difficulties  Cognition Arousal: Alert Behavior During Therapy: WFL for tasks assessed/performed                                            General Comments General comments (skin integrity, edema, etc.): Vitals monitored throughout session; Per RN, removed 2L O2 while ambulating to trial weaning off supplemental O2, after walking approximately 100 feet, Spo2 dropped to 89%, patient became increased shortness of breath; he returned to room, while walking towards room, Spo2 increased to 92% with cues for pursed lip breathing, upon resuming 2L nasal cannula, Spo2 rebounded to >96% within 30 seconds.    Exercises Other Exercises Other Exercises: Educated patient in role of PT; recommended patient slowly resume activity as tolerated while monitoring O2 levels Other Exercises: discussed possible outpatient PT to help return to PLOF, patient declined   Assessment/Plan    PT Assessment Patient does not need any further PT services  PT Problem List         PT Treatment Interventions      PT Goals (Current goals can be found in the Care Plan section)  Acute Rehab PT Goals Patient Stated Goal: to get better PT Goal Formulation: With patient Time For Goal Achievement: 02/14/23 Potential to Achieve Goals: Good    Frequency       Co-evaluation               AM-PAC PT 6 Clicks Mobility  Outcome Measure Help needed turning from your back to your side while in a flat bed without using bedrails?: None Help needed moving from lying on your back to sitting on the side of a flat bed without using bedrails?: None Help  needed moving to and from a bed to a chair (including a wheelchair)?: None Help needed standing up from a chair using your arms (e.g., wheelchair or bedside chair)?: None Help needed to walk in hospital room?: None Help needed climbing 3-5 steps with a railing? : None 6 Click Score: 24    End of Session Equipment Utilized During Treatment: Gait belt Activity Tolerance: Patient tolerated treatment well Patient left: in bed;with call bell/phone within reach Nurse Communication: Mobility status      Time: 1510-1530 PT Time Calculation (min) (ACUTE ONLY): 20 min   Charges:   PT Evaluation $PT Eval Low Complexity: 1 Low   PT General Charges $$ ACUTE PT VISIT: 1 Visit          Dariela Stoker PT, DPT 02/14/2023, 4:20 PM

## 2023-02-14 NOTE — H&P (Addendum)
 [place]   PATIENT NAME: Nathaniel [name]    MR#:  [id number]  DATE OF BIRTH:  [birth year]  DATE OF ADMISSION:  02/14/2023  PRIMARY CARE PHYSICIAN: [facility]   Patient is coming from: Home  REQUESTING/REFERRING PHYSICIAN: Cyrena Mylar, MD  CHIEF COMPLAINT:   Chief Complaint  Patient presents with  . Shortness of Breath    HISTORY OF PRESENT ILLNESS:  Nathaniel [name] is a 68 y.o. male with medical history significant for ESRD on HD on MWF, multiple myeloma, chronic back pain, CKD, COPD, GERD, dyslipidemia and tobacco abuse, presented to the emergency room with acute onset of worsening dyspnea over the last month with associated cough productive of yellowish sputum as well as wheezing and generalized weakness.  He had hemodialysis yesterday.  No orthopnea or paroxysmal nocturnal dyspnea or worsening lower extremity edema.  He has been having intermittent fever and chills.  He had nausea and vomiting without abdominal pain.  No bleeding diathesis.  He admitted to chest pain only with cough.  ED Course: When she came to the ER, temperature was 99.1 BP was 124/8058 with respiratory rate of 26 and otherwise normal vital signs.  Labs revealed a BUN of 85 with a creatinine of 7.63 and chloride of 97 with anion gap of 16 CO2 of 24, alk phos 232 and albumin 3 with total protein of 9.2, AST 56 and ALT 70.  Lactic acid was 1.2 and later 1.2.  CBC showed leukocytosis 13.1 with neutrophilia as well as mild anemia.  Respiratory panel came back negative.  Blood cultures were drawn. EKG as reviewed by me :  EKG showed sinus tachycardia with rate 119 with Q waves anteroseptally. Imaging: Portable chest x-ray showed left lower lobe airspace opacity concerning for pneumonia with stable right upper lobe pulmonary nodule.  The patient was given DuoNebs, 2 g of IV magnesium  sulfate, 125 mg IV Solu-Medrol  as well as IV Rocephin  and Zithromax .  She will be admitted to a progressive  unit bed for further evaluation and management. PAST MEDICAL HISTORY:   Past Medical History:  Diagnosis Date  . Anemia   . Arthritis   . Chronic back pain    Dr. Geofm manages (per pt)  . Chronic kidney disease   . COPD (chronic obstructive pulmonary disease) (HCC)   . Dyspnea    with exertion  . GERD (gastroesophageal reflux disease)    h/o  . Hearing loss   . Hyperlipidemia   . MGUS (monoclonal gammopathy of unknown significance)   . Multiple myeloma (HCC) 2011   and prostate ca in 2019  . Proteinuria   . Tobacco abuse     PAST SURGICAL HISTORY:   Past Surgical History:  Procedure Laterality Date  . A/V FISTULAGRAM Left 01/18/2019   Procedure: A/V FISTULAGRAM;  Surgeon: Jama Cordella MATSU, MD;  Location: [facility] INVASIVE CV LAB;  Service: Cardiovascular;  Laterality: Left;  . A/V FISTULAGRAM Left 12/03/2021   Procedure: A/V Fistulagram;  Surgeon: Jama Cordella MATSU, MD;  Location: [facility] INVASIVE CV LAB;  Service: Cardiovascular;  Laterality: Left;  . AV FISTULA PLACEMENT Left 10/29/2018   Procedure: ARTERIOVENOUS (AV) FISTULA CREATION ( BRACHIAL CEPHALIC);  Surgeon: Jama Cordella MATSU, MD;  Location: [facility] ORS;  Service: Vascular;  Laterality: Left;  . COLONOSCOPY WITH PROPOFOL  N/A 07/04/2014   Procedure: COLONOSCOPY WITH PROPOFOL ;  Surgeon: Rogelia Copping, MD;  Location: [facility] ENDOSCOPY;  Service: Endoscopy;  Laterality: N/A;  . COLONOSCOPY WITH PROPOFOL   N/A 12/10/2021   Procedure: COLONOSCOPY WITH PROPOFOL ;  Surgeon: Jinny Carmine, MD;  Location: [facility] ENDOSCOPY;  Service: Endoscopy;  Laterality: N/A;  . TONSILLECTOMY     as child    SOCIAL HISTORY:   Social History   Tobacco Use  . Smoking status: Former    Current packs/day: 0.00    Average packs/day: 2.0 packs/day for 50.0 years (100.0 ttl pk-yrs)    Types: Cigarettes    Start date: 07/1970    Quit date: 07/2020    Years since quitting: 2.6    Passive exposure: Current  . Smokeless tobacco: Never  Substance Use  Topics  . Alcohol use: No    Alcohol/week: 0.0 standard drinks of alcohol    FAMILY HISTORY:   Family History  Problem Relation Age of Onset  . Heart disease Brother 2       MI  . Colon cancer Neg Hx   . Liver disease Neg Hx   . Prostate cancer Neg Hx   . Kidney cancer Neg Hx   . Bladder Cancer Neg Hx     DRUG ALLERGIES:   Allergies  Allergen Reactions  . No Known Allergies     REVIEW OF SYSTEMS:   ROS As per history of present illness. All pertinent systems were reviewed above. Constitutional, HEENT, cardiovascular, respiratory, GI, GU, musculoskeletal, neuro, psychiatric, endocrine, integumentary and hematologic systems were reviewed and are otherwise negative/unremarkable except for positive findings mentioned above in the HPI.   MEDICATIONS AT HOME:   Prior to Admission medications   Medication Sig Start Date End Date Taking? Authorizing Provider  albuterol  (VENTOLIN  HFA) 108 (90 Base) MCG/ACT inhaler Inhale 2 puffs into the lungs every 4 (four) hours as needed for wheezing or shortness of breath. 01/14/22  Yes Angelena Smalls, MD  amoxicillin  (AMOXIL ) 500 MG tablet Take 500 mg by mouth 3 (three) times daily. 01/19/23  Yes [provider]  ASPIRIN  LOW DOSE 81 MG tablet Take 81 mg by mouth daily. 07/04/21  Yes [provider]  calcium  acetate (PHOSLO) 667 MG capsule Take 667 mg by mouth 2 (two) times daily.   Yes [provider]  cetirizine (ZYRTEC) 10 MG tablet Take 10 mg by mouth daily.   Yes [provider]  Cholecalciferol  (VITAMIN D3) 50 MCG (2000 UT) TABS Take 2,000 Units by mouth at bedtime.   Yes [provider]  fluticasone  (FLONASE) 50 MCG/ACT nasal spray Place 2 sprays into both nostrils daily as needed for allergies. 01/25/15  Yes [provider]  furosemide  (LASIX ) 20 MG tablet Take 20 mg by mouth 2 (two) times daily.   Yes [provider]  lidocaine -prilocaine  (EMLA ) cream Apply 1 application  topically as needed (prior to access for dialysis.).   Yes [provider]  montelukast (SINGULAIR) 10 MG tablet Take 10 mg by mouth at bedtime. 12/03/22  Yes [provider]  Multiple Vitamin (MULTIVITAMIN WITH MINERALS) TABS tablet Take 1 tablet by mouth daily.   Yes [provider]  multivitamin (RENA-VIT) TABS tablet Take 1 tablet by mouth daily. 12/19/19  Yes [provider]  oxyCODONE -acetaminophen  (PERCOCET/ROXICET) 5-325 MG tablet Take 1 tablet by mouth 4 (four) times daily as needed for moderate pain.    Yes [provider]  rOPINIRole  (REQUIP ) 0.25 MG tablet Take 0.25 mg by mouth every morning.   Yes [provider]  sildenafil (VIAGRA) 100 MG tablet Take by mouth. 07/03/21  Yes [provider]  Fluticasone -Umeclidin-Vilant (TRELEGY ELLIPTA ) 100-62.5-25 MCG/INH  AEPB Inhale 1 puff into the lungs daily. Patient not taking: Reported on 02/14/2023 08/05/18   Tamea Dedra CROME, MD  rosuvastatin (CRESTOR) 10 MG tablet Take 10 mg by mouth daily. Patient not taking: Reported on 02/14/2023 07/04/21   [provider]      VITAL SIGNS:  Blood pressure 110/62, pulse (!) 102, temperature 99.1 F (37.3 C), temperature source Oral, resp. rate (!) 26, height 6' (1.829 m), weight 83.9 kg, SpO2 96%.  PHYSICAL EXAMINATION:  Physical Exam  GENERAL:  68 y.o.-year-old patient lying in the bed with mild respiratory distress with conversational dyspnea. EYES: Pupils equal, round, reactive to light and accommodation. No scleral icterus. Extraocular muscles intact.  HEENT: Head atraumatic, normocephalic. Oropharynx and nasopharynx clear.  NECK:  Supple, no jugular venous distention. No thyroid enlargement, no tenderness.  LUNGS: Diminished left basal breath sounds with left basal crackles as well as diffuse expiratory wheezes with tight expiratory airflow and harsh vesicular breathing. No use of accessory muscles of respiration.   CARDIOVASCULAR: Regular rate and rhythm, S1, S2 normal. No murmurs, rubs, or gallops.  ABDOMEN: Soft, nondistended, nontender. Bowel sounds present. No organomegaly or mass.  EXTREMITIES: No pedal edema, cyanosis, or clubbing.  NEUROLOGIC: Cranial nerves II through XII are intact. Muscle strength 5/5 in all extremities. Sensation intact. Gait not checked.  PSYCHIATRIC: The patient is alert and oriented x 3.  Normal affect and good eye contact. SKIN: No obvious rash, lesion, or ulcer.   LABORATORY PANEL:   CBC Recent Labs  Lab 02/14/23 0017  WBC 13.1*  HGB 12.3*  HCT 38.3*  PLT 173   ------------------------------------------------------------------------------------------------------------------  Chemistries  Recent Labs  Lab 02/14/23 0017  NA 137  K 4.1  CL 97*  CO2 24  GLUCOSE 119*  BUN 85*  CREATININE 7.63*  CALCIUM  8.5*  AST 56*  ALT 70*  ALKPHOS 232*  BILITOT 0.9   ------------------------------------------------------------------------------------------------------------------  Cardiac Enzymes No results for input(s): TROPONINI in the last 168 hours. ------------------------------------------------------------------------------------------------------------------  RADIOLOGY:  DG Chest Port 1 View Result Date: 02/14/2023 CLINICAL DATA:  Shortness of breath, chest pain EXAM: PORTABLE CHEST 1 VIEW COMPARISON:  01/14/2022 FINDINGS: Heart and mediastinal contours within normal limits. Left basilar airspace opacity. Right upper lobe nodule again noted, similar to prior study. No effusions or acute bony abnormality. IMPRESSION: Left lower lobe airspace opacity concerning for pneumonia. Stable right upper lobe pulmonary nodule. Electronically Signed   By: Franky Crease M.D.   On: 02/14/2023 01:02      IMPRESSION AND PLAN:  Assessment and Plan: * Sepsis due to pneumonia [facility]) - The patient will be admitted to a medical telemetry bed. - Will continue antibiotic  therapy with IV Rocephin  and Zithromax . - Mucolytic therapy be provided as well as duo nebs q.i.d. and q.4 hours p.r.n. - We will follow blood cultures. - The patient will be hydrated with IV normal saline. - Sepsis is manifested by leukocytosis, tachycardia and tachypnea   COPD with acute exacerbation (HCC) - We will place the patient IV steroid therapy with IV Solu-Medrol  as well as nebulized bronchodilator therapy with duonebs q.i.d. and q.4 hours p.r.n.SABRA - Mucolytic therapy will be provided with Mucinex and antibiotic therapy with IV Rocephin  and Zithromax . - O2 protocol will be followed. - We will hold off long-acting beta agonist.   Acute respiratory failure with hypoxia (HCC) - Management as above as this is related to #2 and #1. - O2 protocol will be followed.  End-stage renal disease on hemodialysis (HCC) -  Nephrology consult will be obtained. - I notified Dr. Douglas about the patient.  Dyslipidemia - We will continue statin therapy.  Coronary artery disease - We will continue aspirin  as well as statin therapy.    DVT prophylaxis: Lovenox .  Advanced Care Planning:  Code Status: full code.  Family Communication:  The plan of care was discussed in details with the patient (and family). I answered all questions. The patient agreed to proceed with the above mentioned plan. Further management will depend upon hospital course. Disposition Plan: Back to previous home environment Consults called: none.  All the records are reviewed and case discussed with ED provider.  Status is: Inpatient   At the time of the admission, it appears that the appropriate admission status for this patient is inpatient.  This is judged to be reasonable and necessary in order to provide the required intensity of service to ensure the patient's safety given the presenting symptoms, physical exam findings and initial radiographic and laboratory data in the context of comorbid conditions.  The  patient requires inpatient status due to high intensity of service, high risk of further deterioration and high frequency of surveillance required.  I certify that at the time of admission, it is my clinical judgment that the patient will require inpatient hospital care extending more than 2 midnights.                            Dispo: The patient is from: Home              Anticipated d/c is to: Home              Patient currently is not medically stable to d/c.              Difficult to place patient: No  [place] DELENA Peaches M.D on 02/14/2023 at 6:12 AM  [facility]   From 7 PM-7 AM, contact night-coverage [email or web address]  CC: Primary care physician; [facility]

## 2023-02-14 NOTE — Assessment & Plan Note (Signed)
-   Management as above as this is related to #2 and #1. - O2 protocol will be followed.

## 2023-02-14 NOTE — Assessment & Plan Note (Signed)
 -  We will continue statin therapy.

## 2023-02-15 DIAGNOSIS — N186 End stage renal disease: Secondary | ICD-10-CM | POA: Diagnosis not present

## 2023-02-15 DIAGNOSIS — J189 Pneumonia, unspecified organism: Secondary | ICD-10-CM | POA: Diagnosis not present

## 2023-02-15 DIAGNOSIS — J441 Chronic obstructive pulmonary disease with (acute) exacerbation: Secondary | ICD-10-CM | POA: Diagnosis not present

## 2023-02-15 DIAGNOSIS — J9601 Acute respiratory failure with hypoxia: Secondary | ICD-10-CM | POA: Diagnosis not present

## 2023-02-15 LAB — BASIC METABOLIC PANEL WITH GFR
Anion gap: 15 (ref 5–15)
BUN: 102 mg/dL — ABNORMAL HIGH (ref 8–23)
CO2: 19 mmol/L — ABNORMAL LOW (ref 22–32)
Calcium: 8.6 mg/dL — ABNORMAL LOW (ref 8.9–10.3)
Chloride: 100 mmol/L (ref 98–111)
Creatinine, Ser: 8.32 mg/dL — ABNORMAL HIGH (ref 0.61–1.24)
GFR, Estimated: 7 mL/min — ABNORMAL LOW
Glucose, Bld: 132 mg/dL — ABNORMAL HIGH (ref 70–99)
Potassium: 5.1 mmol/L (ref 3.5–5.1)
Sodium: 134 mmol/L — ABNORMAL LOW (ref 135–145)

## 2023-02-15 LAB — PROTIME-INR
INR: 1.1 (ref 0.8–1.2)
Prothrombin Time: 14.6 s (ref 11.4–15.2)

## 2023-02-15 LAB — CBC
HCT: 32.5 % — ABNORMAL LOW (ref 39.0–52.0)
Hemoglobin: 10.6 g/dL — ABNORMAL LOW (ref 13.0–17.0)
MCH: 31.2 pg (ref 26.0–34.0)
MCHC: 32.6 g/dL (ref 30.0–36.0)
MCV: 95.6 fL (ref 80.0–100.0)
Platelets: 112 10*3/uL — ABNORMAL LOW (ref 150–400)
RBC: 3.4 MIL/uL — ABNORMAL LOW (ref 4.22–5.81)
RDW: 14.9 % (ref 11.5–15.5)
WBC: 7.4 10*3/uL (ref 4.0–10.5)
nRBC: 0 % (ref 0.0–0.2)

## 2023-02-15 LAB — CORTISOL-AM, BLOOD: Cortisol - AM: 6.3 ug/dL — ABNORMAL LOW (ref 6.7–22.6)

## 2023-02-15 LAB — HEPATITIS B SURFACE ANTIGEN: Hepatitis B Surface Ag: NONREACTIVE

## 2023-02-15 MED ORDER — ALUM & MAG HYDROXIDE-SIMETH 200-200-20 MG/5ML PO SUSP
30.0000 mL | ORAL | Status: DC | PRN
  Administered 2023-02-15: 30 mL via ORAL
  Filled 2023-02-15: qty 30

## 2023-02-15 MED ORDER — ENSURE ENLIVE PO LIQD
237.0000 mL | Freq: Two times a day (BID) | ORAL | Status: DC
  Administered 2023-02-16: 237 mL via ORAL

## 2023-02-15 NOTE — ED Notes (Signed)
 Called Lab to check on phlebotomist.

## 2023-02-15 NOTE — ED Notes (Signed)
 Advised nurse that patient has ready bed

## 2023-02-15 NOTE — Progress Notes (Signed)
 [facility]  ROUNDING NOTE   Subjective:  [name] is a 68yo male PMH of ESRD on dialysis MWF at [facility] [address] MWF, multiple myeloma, chronic back pain, CKD, COPD here with complaints of worsening shortness of breath and generalized weakness.  CXR shows left lower lobe airspace opacity concerning for pneumonia with stable right lower upper lobe pulmonary nodule.   He receives dialysis at [facility] M/W/Fr and is followed by [facility] providers  Patient still waiting for bed placement. Improvement on respiratory function, on room air. Patient reports feeling a whole lot better than he was yesterday, took his oxygen off to eat. Plan for dialysis tomorrow.  Objective:  Vital signs in last 24 hours:  Temp:  [97.7 F (36.5 C)-97.9 F (36.6 C)] 97.8 F (36.6 C) (02/09 0755) Pulse Rate:  [74-91] 91 (02/09 1100) Resp:  [10-31] 20 (02/09 1100) BP: (91-129)/(44-71) 114/65 (02/09 1100) SpO2:  [89 %-99 %] 92 % (02/09 1100) Weight:  [91.7 kg] 91.7 kg (02/08 1321)  Weight change: 7.785 kg Filed Weights   02/14/23 0010 02/14/23 1321  Weight: 83.9 kg 91.7 kg    Intake/Output: I/O last 3 completed shifts: In: 1600.8 [I.V.:300; IV Piggyback:1300.8] Out: 950 [Urine:950]   Intake/Output this shift:  Total I/O In: -  Out: 175 [Urine:175]  Physical Exam: General: NAD,   Head: Normocephalic, atraumatic. Moist oral mucosal membranes  Eyes: Anicteric, PERRL  Neck: Supple, trachea midline  Lungs:  Diminished and clear to auscultation, RA  Heart: Regular rate and rhythm  Abdomen:  Soft, nontender,   Extremities:  no peripheral edema.  Neurologic: Nonfocal, moving all four extremities  Skin: No lesions  Access: Left AVF    Basic Metabolic Panel: Recent Labs  Lab 02/14/23 0017 02/15/23 0834  NA 137 134*  K 4.1 5.1  CL 97* 100  CO2 24 19*  GLUCOSE 119* 132*  BUN 85* 102*  CREATININE 7.63* 8.32*  CALCIUM  8.5* 8.6*  PHOS 5.4*  --     Liver Function  Tests: Recent Labs  Lab 02/14/23 0017  AST 56*  ALT 70*  ALKPHOS 232*  BILITOT 0.9  PROT 9.2*  ALBUMIN 3.0*   No results for input(s): LIPASE, AMYLASE in the last 168 hours. No results for input(s): AMMONIA in the last 168 hours.  CBC: Recent Labs  Lab 02/14/23 0017 02/15/23 0834  WBC 13.1* 7.4  NEUTROABS 10.4*  --   HGB 12.3* 10.6*  HCT 38.3* 32.5*  MCV 97.7 95.6  PLT 173 112*    Cardiac Enzymes: No results for input(s): CKTOTAL, CKMB, CKMBINDEX, TROPONINI in the last 168 hours.  BNP: Invalid input(s): POCBNP  CBG: No results for input(s): GLUCAP in the last 168 hours.  Microbiology: Results for orders placed or performed during the hospital encounter of 02/14/23  Resp panel by RT-PCR (RSV, Flu A&B, Covid) Anterior Nasal Swab     Status: None   Collection Time: 02/14/23 12:17 AM   Specimen: Anterior Nasal Swab  Result Value Ref Range Status   SARS Coronavirus 2 by RT PCR NEGATIVE NEGATIVE Final    Comment: (NOTE) SARS-CoV-2 target nucleic acids are NOT DETECTED.  The SARS-CoV-2 RNA is generally detectable in upper respiratory specimens during the acute phase of infection. The lowest concentration of SARS-CoV-2 viral copies this assay can detect is 138 copies/mL. A negative result does not preclude SARS-Cov-2 infection and should not be used as the sole basis for treatment or other patient management decisions. A negative result  may occur with  improper specimen collection/handling, submission of specimen other than nasopharyngeal swab, presence of viral mutation(s) within the areas targeted by this assay, and inadequate number of viral copies(<138 copies/mL). A negative result must be combined with clinical observations, patient history, and epidemiological information. The expected result is Negative.  Fact Sheet for Patients:  bloggercourse.com  Fact Sheet for Healthcare Providers:   seriousbroker.it  This test is no t yet approved or cleared by the [place]  FDA and  has been authorized for detection and/or diagnosis of SARS-CoV-2 by FDA under an Emergency Use Authorization (EUA). This EUA will remain  in effect (meaning this test can be used) for the duration of the COVID-19 declaration under Section 564(b)(1) of the Act, 21 U.S.C.section 360bbb-3(b)(1), unless the authorization is terminated  or revoked sooner.       Influenza A by PCR NEGATIVE NEGATIVE Final   Influenza B by PCR NEGATIVE NEGATIVE Final    Comment: (NOTE) The Xpert Xpress SARS-CoV-2/FLU/RSV plus assay is intended as an aid in the diagnosis of influenza from Nasopharyngeal swab specimens and should not be used as a sole basis for treatment. Nasal washings and aspirates are unacceptable for Xpert Xpress SARS-CoV-2/FLU/RSV testing.  Fact Sheet for Patients: bloggercourse.com  Fact Sheet for Healthcare Providers: seriousbroker.it  This test is not yet approved or cleared by the [place]  FDA and has been authorized for detection and/or diagnosis of SARS-CoV-2 by FDA under an Emergency Use Authorization (EUA). This EUA will remain in effect (meaning this test can be used) for the duration of the COVID-19 declaration under Section 564(b)(1) of the Act, 21 U.S.C. section 360bbb-3(b)(1), unless the authorization is terminated or revoked.     Resp Syncytial Virus by PCR NEGATIVE NEGATIVE Final    Comment: (NOTE) Fact Sheet for Patients: bloggercourse.com  Fact Sheet for Healthcare Providers: seriousbroker.it  This test is not yet approved or cleared by the [place]  FDA and has been authorized for detection and/or diagnosis of SARS-CoV-2 by FDA under an Emergency Use Authorization (EUA). This EUA will remain in effect (meaning this test can be used) for  the duration of the COVID-19 declaration under Section 564(b)(1) of the Act, 21 U.S.C. section 360bbb-3(b)(1), unless the authorization is terminated or revoked.  Performed at [facility], [address] [place], [place] [address]   Culture, blood (Routine x 2)     Status: None (Preliminary result)   Collection Time: 02/14/23 12:30 AM   Specimen: BLOOD RIGHT ARM  Result Value Ref Range Status   Specimen Description BLOOD RIGHT ARM  Final   Special Requests   Final    BOTTLES DRAWN AEROBIC AND ANAEROBIC Blood Culture results may not be optimal due to an inadequate volume of blood received in culture bottles   Culture   Final    NO GROWTH 1 DAY Performed at [facility], [address] [place], [place] [address]    Report Status PENDING  Incomplete  Culture, blood (Routine x 2)     Status: None (Preliminary result)   Collection Time: 02/14/23 12:46 AM   Specimen: BLOOD  Result Value Ref Range Status   Specimen Description BLOOD BLOOD RIGHT ARM  Final   Special Requests   Final    BOTTLES DRAWN AEROBIC AND ANAEROBIC Blood Culture adequate volume   Culture   Final    NO GROWTH 1 DAY Performed at [facility], [address] [place], [place] [address]    Report Status PENDING  Incomplete    Coagulation Studies: Recent Labs    02/14/23 0017 02/15/23 0834  LABPROT 14.6 14.6  INR 1.1 1.1    Urinalysis: Recent Labs    02/14/23 0848  COLORURINE YELLOW*  LABSPEC 1.012  PHURINE 6.0  GLUCOSEU 50*  HGBUR MODERATE*  BILIRUBINUR NEGATIVE  KETONESUR NEGATIVE  PROTEINUR 100*  NITRITE NEGATIVE  LEUKOCYTESUR NEGATIVE      Imaging: DG Chest Port 1 View Result Date: 02/14/2023 CLINICAL DATA:  Shortness of breath, chest pain EXAM: PORTABLE CHEST 1 VIEW COMPARISON:  01/14/2022 FINDINGS: Heart and mediastinal contours within normal limits. Left basilar airspace opacity. Right upper lobe nodule again noted, similar to prior study. No effusions or  acute bony abnormality. IMPRESSION: Left lower lobe airspace opacity concerning for pneumonia. Stable right upper lobe pulmonary nodule. Electronically Signed   By: Franky Crease M.D.   On: 02/14/2023 01:02     Medications:    azithromycin  Stopped (02/15/23 0005)   cefTRIAXone  (ROCEPHIN )  IV Stopped (02/14/23 2206)    aspirin  EC  81 mg Oral Daily   cholecalciferol   2,000 Units Oral QHS   heparin  injection (subcutaneous)  5,000 Units Subcutaneous Q8H   ipratropium-albuterol   3 mL Nebulization QID   multivitamin  1 tablet Oral QHS   multivitamin with minerals  1 tablet Oral Daily   predniSONE   40 mg Oral Q breakfast   rOPINIRole   0.25 mg Oral QPM   acetaminophen  **OR** acetaminophen , ipratropium-albuterol , magnesium  hydroxide, ondansetron  **OR** ondansetron  (ZOFRAN ) IV, oxyCODONE -acetaminophen , traZODone   Assessment/ Plan:  [name] is a 68yo male PMH of ESRD on dialysis MWF at [facility] MWF, multiple myeloma, chronic back pain, CKD, COPD here with complaints of worsening shortness of breath and generalized weakness.  CXR shows left lower lobe airspace opacity concerning for pneumonia with stable right lower upper lobe pulmonary nodule.    ESRD on dialysis MWF      Spoke with clinic and patient has history of not tolerating fluid removal due to cramping and frequently only needs clearance. Patient on furosemide  20mg  BID with good urine output. Last treatment 2/7. Plan for dialysis on Monday as outpatient schedule.   2. Sepsis due to pneumonia  Patient on IV Rocephin  and Zithromax , mucolytic therapy and cultures pending.    3. Anemia of chronic kidney disease             HgB 12.3 Will monitor for changes and need for ESA on dialysis   4. Secondary hyperparathyroidism   2/8 Ca 8.5 Phos 5.4 and PTH collected Will continue to monitor for changes   LOS: 1 Nathaniel [name] P Nathaniel [name] 2/9/202511:41 AM

## 2023-02-15 NOTE — ED Notes (Signed)
 Paper consent signed and in patient box.

## 2023-02-15 NOTE — ED Notes (Signed)
 Patient removed [place] this morning. Patients O2 saturations have remained in the 90's.

## 2023-02-15 NOTE — Progress Notes (Signed)
 [facility]  PROGRESS NOTE  Nathaniel [name] FMW:[id number] DOB: 03/23/1955 DOA: 02/14/2023 PCP: [facility]   Brief HPI:   68 y.o. male with medical history significant for ESRD on HD on MWF, multiple myeloma, chronic back pain, CKD, COPD, GERD, dyslipidemia and tobacco abuse, presented to the emergency room with acute onset of worsening dyspnea over the last month with associated cough productive of yellowish sputum as well as wheezing and generalized weakness.  Respiratory panel came back negative  Portable chest x-ray showed left lower lobe airspace opacity concerning for pneumonia with stable right upper lobe pulmonary nodule.    Assessment/Plan:    Sepsis due to pneumonia (HCC) -Presented with tachycardia, tachypnea, leukocytosis -Sepsis physiology has resolved - Will continue antibiotic therapy with IV Rocephin  and Zithromax . - Mucolytic therapy be provided as well as duo nebs q.i.d. and q.4 hours p.r.n. - Blood cultures x 2 showed no growth till date     COPD with acute exacerbation (HCC) -Continue prednisone  40 mg dailyl, bronchodilator every 6 hours -Continue Mucinex, Rocephin , Zithromax     Acute respiratory failure with hypoxia (HCC) - Management as above as this is related to #2 and #1. - O2 protocol will be followed.   End-stage renal disease on hemodialysis [facility]) - Nephrology consulted, plan for hemodialysis on Monday -He still makes urine, continue Lasix  20 mg p.o. twice daily    Coronary artery disease - We will continue aspirin     Transaminitis -Unclear etiology -Hold statin for now  Medications     aspirin  EC  81 mg Oral Daily   cholecalciferol   2,000 Units Oral QHS   heparin  injection (subcutaneous)  5,000 Units Subcutaneous Q8H   ipratropium-albuterol   3 mL Nebulization QID   multivitamin  1 tablet Oral QHS   multivitamin with minerals  1 tablet Oral Daily   predniSONE   40 mg Oral Q breakfast   rOPINIRole   0.25 mg  Oral QPM     Data Reviewed:   CBG:  No results for input(s): GLUCAP in the last 168 hours.  SpO2: 96 % O2 Flow Rate (L/min): 2 L/min    Vitals:   02/15/23 0800 02/15/23 1100 02/15/23 1200 02/15/23 1204  BP: (!) 105/59 114/65 122/60   Pulse: 77 91 82   Resp: 10 20 16    Temp:    97.7 F (36.5 C)  TempSrc:    Oral  SpO2: 99% 92% 96%   Weight:      Height:          Data Reviewed:  Basic Metabolic Panel: Recent Labs  Lab 02/14/23 0017 02/15/23 0834  NA 137 134*  K 4.1 5.1  CL 97* 100  CO2 24 19*  GLUCOSE 119* 132*  BUN 85* 102*  CREATININE 7.63* 8.32*  CALCIUM  8.5* 8.6*  PHOS 5.4*  --     CBC: Recent Labs  Lab 02/14/23 0017 02/15/23 0834  WBC 13.1* 7.4  NEUTROABS 10.4*  --   HGB 12.3* 10.6*  HCT 38.3* 32.5*  MCV 97.7 95.6  PLT 173 112*    LFT Recent Labs  Lab 02/14/23 0017  AST 56*  ALT 70*  ALKPHOS 232*  BILITOT 0.9  PROT 9.2*  ALBUMIN 3.0*     Antibiotics: Anti-infectives (From admission, onward)    Start     Dose/Rate Route Frequency Ordered Stop   02/14/23 2300  azithromycin  (ZITHROMAX ) 500 mg in sodium chloride  0.9 % 250 mL IVPB        500  mg 250 mL/hr over 60 Minutes Intravenous Every 24 hours 02/14/23 0520 02/18/23 2259   02/14/23 2200  cefTRIAXone  (ROCEPHIN ) 2 g in sodium chloride  0.9 % 100 mL IVPB        2 g 200 mL/hr over 30 Minutes Intravenous Every 24 hours 02/14/23 0520 02/18/23 2159   02/14/23 0100  cefTRIAXone  (ROCEPHIN ) 1 g in sodium chloride  0.9 % 100 mL IVPB        1 g 200 mL/hr over 30 Minutes Intravenous  Once 02/14/23 0045 02/14/23 0108   02/14/23 0100  azithromycin  (ZITHROMAX ) 500 mg in sodium chloride  0.9 % 250 mL IVPB        500 mg 250 mL/hr over 60 Minutes Intravenous  Once 02/14/23 0045 02/14/23 0212        DVT prophylaxis: Heparin   Code Status: Full code  Family Communication: No family at bedside   CONSULTS nephrology   Subjective   Breathing has improved   Objective    Physical  Examination:  General-appears in no acute distress Heart-S1-S2, regular, no murmur auscultated Lungs-clear to auscultation bilaterally, no wheezing or crackles auscultated Abdomen-soft, nontender, no organomegaly Extremities-no edema in the lower extremities Neuro-alert, oriented x3, no focal deficit noted  Status is: Inpatient:             Nathaniel [name]   [facility] If 7PM-7AM, please contact night-coverage at [email or web address], Office  [phone]   02/15/2023, 12:32 PM  LOS: 1 day

## 2023-02-16 DIAGNOSIS — E440 Moderate protein-calorie malnutrition: Secondary | ICD-10-CM | POA: Insufficient documentation

## 2023-02-16 DIAGNOSIS — J9601 Acute respiratory failure with hypoxia: Secondary | ICD-10-CM | POA: Diagnosis not present

## 2023-02-16 DIAGNOSIS — D696 Thrombocytopenia, unspecified: Secondary | ICD-10-CM | POA: Insufficient documentation

## 2023-02-16 DIAGNOSIS — N186 End stage renal disease: Secondary | ICD-10-CM | POA: Diagnosis not present

## 2023-02-16 DIAGNOSIS — J189 Pneumonia, unspecified organism: Secondary | ICD-10-CM | POA: Diagnosis not present

## 2023-02-16 DIAGNOSIS — J441 Chronic obstructive pulmonary disease with (acute) exacerbation: Secondary | ICD-10-CM | POA: Diagnosis not present

## 2023-02-16 LAB — CBC WITH DIFFERENTIAL/PLATELET
Abs Immature Granulocytes: 0.04 10*3/uL (ref 0.00–0.07)
Basophils Absolute: 0 10*3/uL (ref 0.0–0.1)
Basophils Relative: 0 %
Eosinophils Absolute: 0 10*3/uL (ref 0.0–0.5)
Eosinophils Relative: 0 %
HCT: 32.6 % — ABNORMAL LOW (ref 39.0–52.0)
Hemoglobin: 10.4 g/dL — ABNORMAL LOW (ref 13.0–17.0)
Immature Granulocytes: 1 %
Lymphocytes Relative: 11 %
Lymphs Abs: 0.7 10*3/uL (ref 0.7–4.0)
MCH: 31 pg (ref 26.0–34.0)
MCHC: 31.9 g/dL (ref 30.0–36.0)
MCV: 97.3 fL (ref 80.0–100.0)
Monocytes Absolute: 0.6 10*3/uL (ref 0.1–1.0)
Monocytes Relative: 9 %
Neutro Abs: 5.1 10*3/uL (ref 1.7–7.7)
Neutrophils Relative %: 79 %
Platelets: 139 10*3/uL — ABNORMAL LOW (ref 150–400)
RBC: 3.35 MIL/uL — ABNORMAL LOW (ref 4.22–5.81)
RDW: 15 % (ref 11.5–15.5)
WBC: 6.4 10*3/uL (ref 4.0–10.5)
nRBC: 0 % (ref 0.0–0.2)

## 2023-02-16 LAB — RENAL FUNCTION PANEL
Albumin: 2.4 g/dL — ABNORMAL LOW (ref 3.5–5.0)
Anion gap: 15 (ref 5–15)
BUN: 132 mg/dL — ABNORMAL HIGH (ref 8–23)
CO2: 18 mmol/L — ABNORMAL LOW (ref 22–32)
Calcium: 8.5 mg/dL — ABNORMAL LOW (ref 8.9–10.3)
Chloride: 102 mmol/L (ref 98–111)
Creatinine, Ser: 8.67 mg/dL — ABNORMAL HIGH (ref 0.61–1.24)
GFR, Estimated: 6 mL/min — ABNORMAL LOW (ref >60)
Glucose, Bld: 94 mg/dL (ref 70–99)
Phosphorus: 4.8 mg/dL — ABNORMAL HIGH (ref 2.5–4.6)
Potassium: 5.1 mmol/L (ref 3.5–5.1)
Sodium: 135 mmol/L (ref 135–145)

## 2023-02-16 LAB — PTH, INTACT AND CALCIUM
Calcium, Total (PTH): 8.7 mg/dL (ref 8.6–10.2)
PTH: 66 pg/mL — ABNORMAL HIGH (ref 15–65)

## 2023-02-16 MED ORDER — SODIUM BICARBONATE 650 MG PO TABS
1300.0000 mg | ORAL_TABLET | Freq: Two times a day (BID) | ORAL | Status: DC
  Administered 2023-02-16 – 2023-02-17 (×2): 1300 mg via ORAL
  Filled 2023-02-16 (×2): qty 2

## 2023-02-16 MED ORDER — IPRATROPIUM-ALBUTEROL 0.5-2.5 (3) MG/3ML IN SOLN
3.0000 mL | Freq: Three times a day (TID) | RESPIRATORY_TRACT | Status: DC
  Administered 2023-02-16 – 2023-02-18 (×5): 3 mL via RESPIRATORY_TRACT
  Filled 2023-02-16 (×7): qty 3

## 2023-02-16 MED ORDER — OXYCODONE-ACETAMINOPHEN 5-325 MG PO TABS
ORAL_TABLET | ORAL | Status: AC
  Filled 2023-02-16: qty 1

## 2023-02-16 MED ORDER — NEPRO/CARBSTEADY PO LIQD
237.0000 mL | Freq: Three times a day (TID) | ORAL | Status: DC
  Administered 2023-02-17: 237 mL via ORAL

## 2023-02-16 NOTE — Progress Notes (Signed)
 [facility]  ROUNDING NOTE   Subjective:  [name] is a 68yo male PMH of ESRD on dialysis MWF at [facility] [address] MWF, multiple myeloma, chronic back pain, CKD, COPD here with complaints of worsening shortness of breath and generalized weakness.  CXR shows left lower lobe airspace opacity concerning for pneumonia with stable right lower upper lobe pulmonary nodule.   Patient seen and evaluated during dialysis   HEMODIALYSIS FLOWSHEET:  Blood Flow Rate (mL/min): 349 mL/min Arterial Pressure (mmHg): -211.91 mmHg Venous Pressure (mmHg): 192.72 mmHg TMP (mmHg): 3.23 mmHg Ultrafiltration Rate (mL/min): 317 mL/min Dialysate Flow Rate (mL/min): 299 ml/min  Tolerating treatment well Concerned about desired goal for dialysis, states he cramps easily and request it to be decreased.   Objective:  Vital signs in last 24 hours:  Temp:  [97.5 F (36.4 C)-98.9 F (37.2 C)] 97.6 F (36.4 C) (02/10 0841) Pulse Rate:  [80-98] 90 (02/10 1130) Resp:  [15-27] 17 (02/10 1130) BP: (105-134)/(58-74) 115/66 (02/10 1130) SpO2:  [91 %-97 %] 96 % (02/10 1130) Weight:  [87.9 kg] 87.9 kg (02/10 0847)  Weight change:  Filed Weights   02/14/23 0010 02/14/23 1321 02/16/23 0847  Weight: 83.9 kg 91.7 kg 87.9 kg    Intake/Output: I/O last 3 completed shifts: In: -  Out: 1770 [Urine:1770]   Intake/Output this shift:  No intake/output data recorded.  Physical Exam: General: NAD,   Head: Normocephalic, atraumatic. Moist oral mucosal membranes  Eyes: Anicteric  Lungs:  Diminished and clear to auscultation, RA  Heart: Regular rate and rhythm  Abdomen:  Soft, nontender  Extremities:  no peripheral edema.  Neurologic: Alert, oriented, moving all four extremities  Skin: No lesions  Access: Left AVF    Basic Metabolic Panel: Recent Labs  Lab 02/14/23 0017 02/15/23 0834 02/16/23 0857  NA 137 134* 135  K 4.1 5.1 5.1  CL 97* 100 102  CO2 24 19* 18*  GLUCOSE 119* 132* 94   BUN 85* 102* 132*  CREATININE 7.63* 8.32* 8.67*  CALCIUM  8.5* 8.6* 8.5*  PHOS 5.4*  --  4.8*    Liver Function Tests: Recent Labs  Lab 02/14/23 0017 02/16/23 0857  AST 56*  --   ALT 70*  --   ALKPHOS 232*  --   BILITOT 0.9  --   PROT 9.2*  --   ALBUMIN 3.0* 2.4*   No results for input(s): "LIPASE", "AMYLASE" in the last 168 hours. No results for input(s): "AMMONIA" in the last 168 hours.  CBC: Recent Labs  Lab 02/14/23 0017 02/15/23 0834 02/16/23 0857  WBC 13.1* 7.4 6.4  NEUTROABS 10.4*  --  5.1  HGB 12.3* 10.6* 10.4*  HCT 38.3* 32.5* 32.6*  MCV 97.7 95.6 97.3  PLT 173 112* 139*    Cardiac Enzymes: No results for input(s): "CKTOTAL", "CKMB", "CKMBINDEX", "TROPONINI" in the last 168 hours.  BNP: Invalid input(s): "POCBNP"  CBG: No results for input(s): "GLUCAP" in the last 168 hours.  Microbiology: Results for orders placed or performed during the hospital encounter of 02/14/23  Resp panel by RT-PCR (RSV, Flu A&B, Covid) Anterior Nasal Swab     Status: None   Collection Time: 02/14/23 12:17 AM   Specimen: Anterior Nasal Swab  Result Value Ref Range Status   SARS Coronavirus 2 by RT PCR NEGATIVE NEGATIVE Final    Comment: (NOTE) SARS-CoV-2 target nucleic acids are NOT DETECTED.  The SARS-CoV-2 RNA is generally detectable in upper respiratory specimens during the acute phase of  infection. The lowest concentration of SARS-CoV-2 viral copies this assay can detect is 138 copies/mL. A negative result does not preclude SARS-Cov-2 infection and should not be used as the sole basis for treatment or other patient management decisions. A negative result may occur with  improper specimen collection/handling, submission of specimen other than nasopharyngeal swab, presence of viral mutation(s) within the areas targeted by this assay, and inadequate number of viral copies(<138 copies/mL). A negative result must be combined with clinical observations, patient history,  and epidemiological information. The expected result is Negative.  Fact Sheet for Patients:  BloggerCourse.com  Fact Sheet for Healthcare Providers:  SeriousBroker.it  This test is no t yet approved or cleared by the [place]  FDA and  has been authorized for detection and/or diagnosis of SARS-CoV-2 by FDA under an Emergency Use Authorization (EUA). This EUA will remain  in effect (meaning this test can be used) for the duration of the COVID-19 declaration under Section 564(b)(1) of the Act, 21 U.S.C.section 360bbb-3(b)(1), unless the authorization is terminated  or revoked sooner.       Influenza A by PCR NEGATIVE NEGATIVE Final   Influenza B by PCR NEGATIVE NEGATIVE Final    Comment: (NOTE) The Xpert Xpress SARS-CoV-2/FLU/RSV plus assay is intended as an aid in the diagnosis of influenza from Nasopharyngeal swab specimens and should not be used as a sole basis for treatment. Nasal washings and aspirates are unacceptable for Xpert Xpress SARS-CoV-2/FLU/RSV testing.  Fact Sheet for Patients: BloggerCourse.com  Fact Sheet for Healthcare Providers: SeriousBroker.it  This test is not yet approved or cleared by the [place]  FDA and has been authorized for detection and/or diagnosis of SARS-CoV-2 by FDA under an Emergency Use Authorization (EUA). This EUA will remain in effect (meaning this test can be used) for the duration of the COVID-19 declaration under Section 564(b)(1) of the Act, 21 U.S.C. section 360bbb-3(b)(1), unless the authorization is terminated or revoked.     Resp Syncytial Virus by PCR NEGATIVE NEGATIVE Final    Comment: (NOTE) Fact Sheet for Patients: BloggerCourse.com  Fact Sheet for Healthcare Providers: SeriousBroker.it  This test is not yet approved or cleared by the [place]  FDA and has  been authorized for detection and/or diagnosis of SARS-CoV-2 by FDA under an Emergency Use Authorization (EUA). This EUA will remain in effect (meaning this test can be used) for the duration of the COVID-19 declaration under Section 564(b)(1) of the Act, 21 U.S.C. section 360bbb-3(b)(1), unless the authorization is terminated or revoked.  Performed at [facility], [address] [place], [place] [address]   Culture, blood (Routine x 2)     Status: None (Preliminary result)   Collection Time: 02/14/23 12:30 AM   Specimen: BLOOD RIGHT ARM  Result Value Ref Range Status   Specimen Description BLOOD RIGHT ARM  Final   Special Requests   Final    BOTTLES DRAWN AEROBIC AND ANAEROBIC Blood Culture results may not be optimal due to an inadequate volume of blood received in culture bottles   Culture   Final    NO GROWTH 2 DAYS Performed at [facility], [address], [place], [place] [address]    Report Status PENDING  Incomplete  Culture, blood (Routine x 2)     Status: None (Preliminary result)   Collection Time: 02/14/23 12:46 AM   Specimen: BLOOD  Result Value Ref Range Status   Specimen Description BLOOD BLOOD RIGHT ARM  Final   Special Requests   Final  BOTTLES DRAWN AEROBIC AND ANAEROBIC Blood Culture adequate volume   Culture   Final    NO GROWTH 2 DAYS Performed at [facility], [address] [place] [place], [place] [address]    Report Status PENDING  Incomplete    Coagulation Studies: Recent Labs    02/14/23 0017 02/15/23 0834  LABPROT 14.6 14.6  INR 1.1 1.1    Urinalysis: Recent Labs    02/14/23 0848  COLORURINE YELLOW*  LABSPEC 1.012  PHURINE 6.0  GLUCOSEU 50*  HGBUR MODERATE*  BILIRUBINUR NEGATIVE  KETONESUR NEGATIVE  PROTEINUR 100*  NITRITE NEGATIVE  LEUKOCYTESUR NEGATIVE      Imaging: No results found.    Medications:    azithromycin  500 mg (02/15/23 2239)   cefTRIAXone  (ROCEPHIN )  IV 2 g (02/15/23 2100)     aspirin  EC  81 mg Oral Daily   cholecalciferol   2,000 Units Oral QHS   feeding supplement  237 mL Oral BID BM   heparin  injection (subcutaneous)  5,000 Units Subcutaneous Q8H   ipratropium-albuterol   3 mL Nebulization TID   multivitamin  1 tablet Oral QHS   multivitamin with minerals  1 tablet Oral Daily   predniSONE   40 mg Oral Q breakfast   rOPINIRole   0.25 mg Oral QPM   acetaminophen  **OR** acetaminophen , alum & mag hydroxide-simeth, ipratropium-albuterol , magnesium  hydroxide, ondansetron  **OR** ondansetron  (ZOFRAN ) IV, oxyCODONE -acetaminophen , traZODone   Assessment/ Plan:  [name] is a 68yo male PMH of ESRD on dialysis MWF at [facility] [address] MWF, multiple myeloma, chronic back pain, CKD, COPD here with complaints of worsening shortness of breath and generalized weakness.  CXR shows left lower lobe airspace opacity concerning for pneumonia with stable right lower upper lobe pulmonary nodule.    ESRD on dialysis MWF      Confirmed with clinic, EDW 85.5 kg and patient does not tolerated UF. Pre-treatment weight 87 kg. Will attempt 0.5L  Next treatment scheduled for Wednesday.   2. Sepsis due to pneumonia  Patient on IV Rocephin  and Zithromax , mucolytic therapy and cultures pending. Negative thus far.   3. Anemia of chronic kidney disease             HgB 10.4 Remains stable   4. Secondary hyperparathyroidism   2/8 Ca 8.5 Phos 5.4 and PTH collected  Bone minerals acceptable.    LOS: 2 Lauryn Lizardi 2/10/202512:03 PM

## 2023-02-16 NOTE — Plan of Care (Signed)
  Problem: Activity: Goal: Will verbalize the importance of balancing activity with adequate rest periods Outcome: Progressing   Problem: Respiratory: Goal: Ability to maintain a clear airway will improve Outcome: Progressing   Problem: Safety: Goal: Ability to remain free from injury will improve Outcome: Progressing   Problem: Pain Managment: Goal: General experience of comfort will improve and/or be controlled Outcome: Progressing

## 2023-02-16 NOTE — Plan of Care (Signed)
   Problem: Fluid Volume: Goal: Hemodynamic stability will improve Outcome: Progressing   Problem: Clinical Measurements: Goal: Diagnostic test results will improve Outcome: Progressing Goal: Signs and symptoms of infection will decrease Outcome: Progressing   Problem: Respiratory: Goal: Ability to maintain adequate ventilation will improve Outcome: Progressing   Problem: Education: Goal: Knowledge of General Education information will improve Description: Including pain rating scale, medication(s)/side effects and non-pharmacologic comfort measures Outcome: Progressing   Problem: Health Behavior/Discharge Planning: Goal: Ability to manage health-related needs will improve Outcome: Progressing   Problem: Clinical Measurements: Goal: Ability to maintain clinical measurements within normal limits will improve Outcome: Progressing Goal: Will remain free from infection Outcome: Progressing Goal: Diagnostic test results will improve Outcome: Progressing Goal: Respiratory complications will improve Outcome: Progressing Goal: Cardiovascular complication will be avoided Outcome: Progressing   Problem: Activity: Goal: Risk for activity intolerance will decrease Outcome: Progressing   Problem: Nutrition: Goal: Adequate nutrition will be maintained Outcome: Progressing   Problem: Coping: Goal: Level of anxiety will decrease Outcome: Progressing   Problem: Elimination: Goal: Will not experience complications related to bowel motility Outcome: Progressing Goal: Will not experience complications related to urinary retention Outcome: Progressing   Problem: Pain Managment: Goal: General experience of comfort will improve and/or be controlled Outcome: Progressing   Problem: Safety: Goal: Ability to remain free from injury will improve Outcome: Progressing   Problem: Skin Integrity: Goal: Risk for impaired skin integrity will decrease Outcome: Progressing   Problem:  Education: Goal: Knowledge of disease or condition will improve Outcome: Progressing Goal: Knowledge of the prescribed therapeutic regimen will improve Outcome: Progressing Goal: Individualized Educational Video(s) Outcome: Progressing   Problem: Activity: Goal: Ability to tolerate increased activity will improve Outcome: Progressing Goal: Will verbalize the importance of balancing activity with adequate rest periods Outcome: Progressing   Problem: Respiratory: Goal: Ability to maintain a clear airway will improve Outcome: Progressing Goal: Levels of oxygenation will improve Outcome: Progressing Goal: Ability to maintain adequate ventilation will improve Outcome: Progressing

## 2023-02-16 NOTE — Plan of Care (Signed)

## 2023-02-16 NOTE — Hospital Course (Signed)
 68 y.o. male with medical history significant for ESRD on HD on MWF, multiple myeloma, chronic back pain, CKD, COPD, GERD, dyslipidemia and tobacco abuse, presented to the emergency room with acute onset of worsening dyspnea over the last month with associated cough productive of yellowish sputum as well as wheezing and generalized weakness.  Respiratory panel came back negative  Portable chest x-ray showed left lower lobe airspace opacity concerning for pneumonia with stable right upper lobe pulmonary nodule.  Placed on Rocephin  and Zithromax  for pneumonia, prednisone  for COPD exacerbation.  Patient is also scheduled on bronchodilators.

## 2023-02-16 NOTE — Progress Notes (Signed)
 Received patient in bed to unit.  Alert and oriented.  Informed consent signed and in chart.   TX duration: 3 hour   Patient tolerated well.  Transported back to the room  Alert, without acute distress.  Hand-off given to patient's nurse.   Access used: Left Upper AVF Access issues: none  Total UF removed: 300 ML Medication(s) given: none  Post HD weight: 87.5 5kg  Lorinda Root, RN Kidney Dialysis Unit

## 2023-02-16 NOTE — Progress Notes (Signed)
  Progress Note   Patient: Nathaniel [name] NWG:[id number] DOB: March 31, 1955 DOA: 02/14/2023     2 DOS: the patient was seen and examined on 02/16/2023   Brief hospital course: 68 y.o. male with medical history significant for ESRD on HD on MWF, multiple myeloma, chronic back pain, CKD, COPD, GERD, dyslipidemia and tobacco abuse, presented to the emergency room with acute onset of worsening dyspnea over the last month with associated cough productive of yellowish sputum as well as wheezing and generalized weakness.  Respiratory panel came back negative  Portable chest x-ray showed left lower lobe airspace opacity concerning for pneumonia with stable right upper lobe pulmonary nodule.  Placed on Rocephin  and Zithromax  for pneumonia, prednisone  for COPD exacerbation.  Patient is also scheduled on bronchodilators.   Principal Problem:   Sepsis due to pneumonia [facility]) Active Problems:   COPD with acute exacerbation (HCC)   Acute respiratory failure with hypoxia (HCC)   End-stage renal disease on hemodialysis (HCC)   Coronary artery disease   Dyslipidemia   COPD exacerbation (HCC)   Thrombocytopenia (HCC)   Assessment and Plan: Sepsis due to pneumonia (HCC) Left lower lobe pneumonia. -Presented with tachycardia, tachypnea, leukocytosis -Sepsis physiology has resolved - Blood cultures x 2 showed no growth till date Will complete 5 days antibiotics with Rocephin  and Zithromax .      COPD with acute exacerbation (HCC) Acute respiratory failure with hypoxia. Tobacco abuse. -Continue prednisone  40 mg dailyl, bronchodilator every 6 hours Patient still does not feel at baseline, still significant short of breath with exertion, cough better.  Continue current steroids.  Continue bronchodilator.  Added incentive spirometer and a flutter valve. Still smoking cigarettes, advised to quit.   End-stage renal disease on hemodialysis [facility]) Dialysis per nephrology.     Coronary artery disease - We will  continue aspirin     Transaminitis -Unclear etiology -Hold statin for now      Subjective:  Patient still has significant short of breath with duration.  Cough much better.  Still on 2 L oxygen.  Physical Exam: Vitals:   02/16/23 0841 02/16/23 0847 02/16/23 0853 02/16/23 0930  BP: 134/70  127/74 111/61  Pulse: 91  89 88  Resp: 18  17 (!) 24  Temp: 97.6 F (36.4 C)     TempSrc: Oral     SpO2: 95%  95% 95%  Weight:  87.9 kg    Height:       General exam: Appears calm and comfortable  Respiratory system: Significant decreased breathing sounds. Respiratory effort normal. Cardiovascular system: S1 & S2 heard, RRR. No JVD, murmurs, rubs, gallops or clicks. No pedal edema. Gastrointestinal system: Abdomen is nondistended, soft and nontender. No organomegaly or masses felt. Normal bowel sounds heard. Central nervous system: Alert and oriented. No focal neurological deficits. Extremities: Symmetric 5 x 5 power. Skin: No rashes, lesions or ulcers Psychiatry: Judgement and insight appear normal. Mood & affect appropriate.    Data Reviewed:  Chest x-ray and lab results reviewed.  Family Communication: None  Disposition: Status is: Inpatient Remains inpatient appropriate because: Severity of disease, IV treatment.     Time spent: 35 minutes  Author: Donaciano Frizzle, MD 02/16/2023 10:05 AM  For on call review [email or web address].

## 2023-02-16 NOTE — Progress Notes (Addendum)
 Initial Nutrition Assessment DOCUMENTATION CODES:  Non-severe (moderate) malnutrition in context of chronic illness  INTERVENTION:  Nepro Shake po TID, each supplement provides 425 kcal and 19 grams protein  Renal MVI daily  Liberalize diet from heart healthy to regular  Discussed importance of eating consistently and drinking Nepro shakes to aid in recovery  NUTRITION DIAGNOSIS:  Moderate Malnutrition related to chronic illness as evidenced by energy intake < 75% for > or equal to 1 month, mild fat depletion, mild muscle depletion.  GOAL:  Patient will meet greater than or equal to 90% of their needs  MONITOR:  PO intake, Supplement acceptance  REASON FOR ASSESSMENT:  Consult Assessment of nutrition requirement/status  ASSESSMENT:  Nathaniel [name] with hx of ESRD on HD, CKD, COPD, dyslipidemia, myeloma, and GERD. Hx of tobacco abuse. Nathaniel [name] admitted with worsening SOB, wheezing, cough, with suspected sepsis due to pneumonia.  Spoke with Nathaniel [name] who was awake and alert. Nathaniel [name] reported better appetite since admission and ate most of lunch today (salad with grilled chicken).  PTA, Nathaniel [name] had very low appetite and reports eating inconsistently. PTA, Nathaniel [name] reports n/v and diarrhea which affected appetite. Nathaniel [name] reports dialysis treatments MWF at the 10am slot and would not eat prior to treatment to "make his goal weight". Nathaniel [name] reports typically eating a snack like crackers during treatment and reports frequent cramping during treatments. After treatment, Nathaniel [name] reports eating one meal like chicken pot pie since he is usually hungry after dialysis. Nathaniel [name] reports forgetting to take Phosphate binders (phoslo) that are sent directly to his house from dialysis clinic. Nathaniel [name] reports on non-dialysis days, he drinks a morning coffee and usually a protein shake, but no scheduled meals. Nathaniel [name] lives at home with wife who he states "does not cook".  Dry wt 85.5kg per chart review. Nathaniel [name] reports recent wt loss of 15# in last month and stated his typical  wt from 09/2022 as 247# (but was unsure if that was an accurate dry wt).  Nathaniel [name] also states he can tell he has had muscle depletion and his mobility has decreased. 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   Nathaniel [name] agreeable to trying Nepro shakes during admission. Discussed liberalizing diet to give Nathaniel [name] more options.  Medications reviewed and include: vitamin D3 1x/day, Ensure Enlive BID (discontinue and change to Nepro TID), RENA-VIT 1x/day, MVI 1x/day (discontinue) Heparin , ropinirole     Labs reviewed: Hgb 10.4, GFR 6, Phos 4.8  NUTRITION - FOCUSED PHYSICAL EXAM: Flowsheet Row Most Recent Value  Orbital Region No depletion  Upper Arm Region Mild depletion  Thoracic and Lumbar Region Mild depletion  Buccal Region No depletion  Temple Region Mild depletion  Clavicle Bone Region No depletion  Clavicle and Acromion Bone Region No depletion  Scapular Bone Region Mild depletion  Dorsal Hand Mild depletion  Patellar Region Mild depletion  Anterior Thigh Region Mild depletion  Posterior Calf Region Moderate depletion  Edema (RD Assessment) None  Hair Reviewed  Eyes Reviewed  [pale]  Mouth Reviewed  Skin Reviewed  Nails Reviewed  [pale]   Diet Order:   Diet Order             Diet regular Room service appropriate? Yes; Fluid consistency: Thin  Diet effective now                  EDUCATION NEEDS:  Education needs have been addressed  Skin:  Skin Assessment: Reviewed RN Assessment  Last BM:  2/10 unknown, PTA  Height:  Ht Readings from Last 1 Encounters:  02/14/23 6' (1.829 m)   Weight:  Wt Readings from Last 1 Encounters:  02/16/23 87.6 kg   Ideal Body Weight:  80.9 kg  BMI:  Body mass index is 26.19 kg/m.  Estimated Nutritional Needs:  Kcal:  2100-2300 Protein:  125-135g Fluid:  + UOP  Harland Libman Dietetic Intern

## 2023-02-17 DIAGNOSIS — N186 End stage renal disease: Secondary | ICD-10-CM | POA: Diagnosis not present

## 2023-02-17 DIAGNOSIS — A419 Sepsis, unspecified organism: Secondary | ICD-10-CM | POA: Diagnosis not present

## 2023-02-17 DIAGNOSIS — J441 Chronic obstructive pulmonary disease with (acute) exacerbation: Secondary | ICD-10-CM | POA: Diagnosis not present

## 2023-02-17 DIAGNOSIS — J189 Pneumonia, unspecified organism: Secondary | ICD-10-CM | POA: Diagnosis not present

## 2023-02-17 LAB — BASIC METABOLIC PANEL
Anion gap: 10 (ref 5–15)
BUN: 84 mg/dL — ABNORMAL HIGH (ref 8–23)
CO2: 25 mmol/L (ref 22–32)
Calcium: 7.9 mg/dL — ABNORMAL LOW (ref 8.9–10.3)
Chloride: 100 mmol/L (ref 98–111)
Creatinine, Ser: 6.07 mg/dL — ABNORMAL HIGH (ref 0.61–1.24)
GFR, Estimated: 10 mL/min — ABNORMAL LOW (ref >60)
Glucose, Bld: 122 mg/dL — ABNORMAL HIGH (ref 70–99)
Potassium: 5.7 mmol/L — ABNORMAL HIGH (ref 3.5–5.1)
Sodium: 135 mmol/L (ref 135–145)

## 2023-02-17 LAB — MAGNESIUM: Magnesium: 2.7 mg/dL — ABNORMAL HIGH (ref 1.7–2.4)

## 2023-02-17 LAB — HEPATITIS B SURFACE ANTIBODY, QUANTITATIVE: Hep B S AB Quant (Post): <3.5 m[IU]/mL — ABNORMAL LOW

## 2023-02-17 MED ORDER — SODIUM BICARBONATE 8.4 % IV SOLN
50.0000 meq | Freq: Once | INTRAVENOUS | Status: AC
  Administered 2023-02-17: 50 meq via INTRAVENOUS
  Filled 2023-02-17: qty 50

## 2023-02-17 MED ORDER — CALCIUM GLUCONATE-NACL 1-0.675 GM/50ML-% IV SOLN
1.0000 g | Freq: Once | INTRAVENOUS | Status: AC
  Administered 2023-02-17: 1000 mg via INTRAVENOUS
  Filled 2023-02-17: qty 50

## 2023-02-17 MED ORDER — SODIUM ZIRCONIUM CYCLOSILICATE 10 G PO PACK
10.0000 g | PACK | Freq: Once | ORAL | Status: AC
  Administered 2023-02-17: 10 g via ORAL
  Filled 2023-02-17: qty 1

## 2023-02-17 NOTE — Progress Notes (Signed)
Mobility Specialist - Progress Note   Pre-mobility: SpO2(95) During mobility: SpO2(90) Post-mobility: SPO2(94)     02/17/23 0951  Mobility  Activity Ambulated with assistance in hallway;Ambulated independently in hallway  Level of Assistance Modified independent, requires aide device or extra time (Needed extra time)  Distance Ambulated (ft) 400 ft  Range of Motion/Exercises Active  Activity Response Tolerated well  Mobility Referral Yes  Mobility visit 1 Mobility   Pt resting in bed upon entry on RA. Pt STS and ambulates to hallway around NS ModI with no AD; Pt needed extra time during ambulation. Pt had brief initial stumble when getting up but recovered quickly. Pt took x1 seated rest break to recover breath after 160 ft and Pt had a persistant cough throughout session. Pt returned to bed and left with needs in reach. Pt bed alarm set.   Johnathan Hausen Mobility Specialist 02/17/23, 10:00 AM

## 2023-02-17 NOTE — Progress Notes (Addendum)
Progress Note   Patient: Nathaniel [name] YNW:[id number] DOB: 1955/09/03 DOA: 02/14/2023     3 DOS: the patient was seen and examined on 02/17/2023   Brief hospital course: 68 y.o. male with medical history significant for ESRD on HD on MWF, multiple myeloma, chronic back pain, CKD, COPD, GERD, dyslipidemia and tobacco abuse, presented to the emergency room with acute onset of worsening dyspnea over the last month with associated cough productive of yellowish sputum as well as wheezing and generalized weakness.  Respiratory panel came back negative  Portable chest x-ray showed left lower lobe airspace opacity concerning for pneumonia with stable right upper lobe pulmonary nodule.  Placed on Rocephin and Zithromax for pneumonia, prednisone for COPD exacerbation.  Patient is also scheduled on bronchodilators.   Principal Problem:   Sepsis due to pneumonia [facility]) Active Problems:   COPD with acute exacerbation (HCC)   Acute respiratory failure with hypoxia (HCC)   End-stage renal disease on hemodialysis (HCC)   Coronary artery disease   Dyslipidemia   COPD exacerbation (HCC)   Thrombocytopenia (HCC)   Malnutrition of moderate degree   Assessment and Plan: Sepsis due to pneumonia (HCC) Left lower lobe pneumonia. -Presented with tachycardia, tachypnea, leukocytosis -Sepsis physiology has resolved - Blood cultures x 2 showed no growth till date Will complete 5 days antibiotics with Rocephin and Zithromax. Patient has improved.      COPD with acute exacerbation (HCC) Acute respiratory failure with hypoxia. Tobacco abuse. -Continue prednisone 40 mg dailyl, bronchodilator every 6 hours Patient still does not feel at baseline, still significant short of breath with exertion, cough better.  Continue current steroids.  Continue bronchodilator.  Added incentive spirometer and a flutter valve. Still smoking cigarettes, advised to quit. Short of breath essentially resolved.  Wean off oxygen.     End-stage renal disease on hemodialysis (HCC) Hyperkalemia. Metabolic acidosis improved. Patient received dialysis yesterday, potassium 5.7 today, discussed with nephrology, they have no plan for dialyzed today.  Will give Lokelma, sodium bicarb and calcium gluconate.  Scheduled dialyzed tomorrow.     Coronary artery disease - We will continue aspirin    Transaminitis -Unclear etiology -Hold statin for now   Moderate protein calorie malnutrition. .Moderate Malnutrition related to chronic illness as evidenced by energy intake < 75% for > or equal to 1 month, mild fat depletion, mild muscle depletion Started a protein supplement.       Subjective:  Patient doing well today, currently short of breath much improved.  Physical Exam: Vitals:   02/17/23 0742 02/17/23 0752 02/17/23 1158 02/17/23 1400  BP: 105/74  109/60   Pulse: 78  82   Resp: 19  17   Temp: (!) 97.5 F (36.4 C)  97.6 F (36.4 C)   TempSrc: Oral  Oral   SpO2: 97% 99% 95% 97%  Weight:      Height:       General exam: Appears calm and comfortable  Respiratory system: Decreased breath sounds. Respiratory effort normal. Cardiovascular system: S1 & S2 heard, RRR. No JVD, murmurs, rubs, gallops or clicks. No pedal edema. Gastrointestinal system: Abdomen is nondistended, soft and nontender. No organomegaly or masses felt. Normal bowel sounds heard. Central nervous system: Alert and oriented. No focal neurological deficits. Extremities: Symmetric 5 x 5 power. Skin: No rashes, lesions or ulcers Psychiatry: Judgement and insight appear normal. Mood & affect appropriate.    Data Reviewed:  Lab results reviewed.  Family Communication: None  Disposition: Status is: Inpatient Remains inpatient appropriate  because: Severity disease, IV treatment.     Time spent: 36 minutes  Author: Marrion Coy, MD 02/17/2023 2:18 PM  For on call review [email or web address].

## 2023-02-17 NOTE — Progress Notes (Signed)
PT Cancellation Note  Patient Details Name: Nathaniel [name] MRN: [id number] DOB: Apr 26, 1955   Cancelled Treatment:    Reason Eval/Treat Not Completed: Other (comment). Of note, new consult received. Pt initially evaluated on 2/8 with no PT follow up recommended. Per MS, pt continues to mobilize well. Continue to recommend mobility efforts with MS team. No needs at this time. Will dc in house.   Anneliese Leblond 02/17/2023, 10:44 AM Elizabeth Palau, PT, DPT, GCS [phone]

## 2023-02-17 NOTE — Plan of Care (Signed)
Problem: Fluid Volume: Goal: Hemodynamic stability will improve Outcome: Progressing   Problem: Clinical Measurements: Goal: Diagnostic test results will improve Outcome: Progressing Goal: Signs and symptoms of infection will decrease Outcome: Progressing   Problem: Respiratory: Goal: Ability to maintain adequate ventilation will improve Outcome: Progressing   Problem: Education: Goal: Knowledge of General Education information will improve Description: Including pain rating scale, medication(s)/side effects and non-pharmacologic comfort measures Outcome: Progressing   Problem: Health Behavior/Discharge Planning: Goal: Ability to manage health-related needs will improve Outcome: Progressing   Problem: Clinical Measurements: Goal: Ability to maintain clinical measurements within normal limits will improve Outcome: Progressing Goal: Will remain free from infection Outcome: Progressing Goal: Diagnostic test results will improve Outcome: Progressing Goal: Respiratory complications will improve Outcome: Progressing Goal: Cardiovascular complication will be avoided Outcome: Progressing   Problem: Activity: Goal: Risk for activity intolerance will decrease Outcome: Progressing   Problem: Nutrition: Goal: Adequate nutrition will be maintained Outcome: Progressing   Problem: Coping: Goal: Level of anxiety will decrease Outcome: Progressing   Problem: Elimination: Goal: Will not experience complications related to bowel motility Outcome: Progressing Goal: Will not experience complications related to urinary retention Outcome: Progressing   Problem: Pain Managment: Goal: General experience of comfort will improve and/or be controlled Outcome: Progressing   Problem: Safety: Goal: Ability to remain free from injury will improve Outcome: Progressing   Problem: Skin Integrity: Goal: Risk for impaired skin integrity will decrease Outcome: Progressing   Problem:  Education: Goal: Knowledge of disease or condition will improve Outcome: Progressing Goal: Knowledge of the prescribed therapeutic regimen will improve Outcome: Progressing Goal: Individualized Educational Video(s) Outcome: Progressing   Problem: Activity: Goal: Ability to tolerate increased activity will improve Outcome: Progressing Goal: Will verbalize the importance of balancing activity with adequate rest periods Outcome: Progressing   Problem: Respiratory: Goal: Ability to maintain a clear airway will improve Outcome: Progressing Goal: Levels of oxygenation will improve Outcome: Progressing Goal: Ability to maintain adequate ventilation will improve Outcome: Progressing

## 2023-02-17 NOTE — Progress Notes (Signed)
[facility]  ROUNDING NOTE   Subjective:  [name] is a 68yo male PMH of ESRD on dialysis MWF at [facility] MWF, multiple myeloma, chronic back pain, CKD, COPD here with complaints of worsening shortness of breath and generalized weakness.  CXR shows left lower lobe airspace opacity concerning for pneumonia with stable right lower upper lobe pulmonary nodule.   Patient seen resting in bed Denies shortness of breath Room air  Dialysis yesterday   Objective:  Vital signs in last 24 hours:  Temp:  [97.5 F (36.4 C)-98 F (36.7 C)] 97.6 F (36.4 C) (02/11 1158) Pulse Rate:  [78-92] 82 (02/11 1158) Resp:  [16-19] 17 (02/11 1158) BP: (97-115)/(55-74) 109/60 (02/11 1158) SpO2:  [94 %-99 %] 95 % (02/11 1158)  Weight change:  Filed Weights   02/14/23 1321 02/16/23 0847 02/16/23 1210  Weight: 91.7 kg 87.9 kg 87.6 kg    Intake/Output: I/O last 3 completed shifts: In: -  Out: 2020.3 [Urine:2020; Other:0.3]   Intake/Output this shift:  Total I/O In: 360 [P.O.:360] Out: -   Physical Exam: General: NAD  Head: Normocephalic, atraumatic. Moist oral mucosal membranes  Eyes: Anicteric  Lungs:  Diminished and clear to auscultation, RA  Heart: Regular rate and rhythm  Abdomen:  Soft, nontender  Extremities:  no peripheral edema.  Neurologic: Alert, oriented, moving all four extremities  Skin: No lesions  Access: Left AVF    Basic Metabolic Panel: Recent Labs  Lab 02/14/23 0017 02/15/23 0834 02/16/23 0857 02/17/23 0234  NA 137 134* 135 135  K 4.1 5.1 5.1 5.7*  CL 97* 100 102 100  CO2 24 19* 18* 25  GLUCOSE 119* 132* 94 122*  BUN 85* 102* 132* 84*  CREATININE 7.63* 8.32* 8.67* 6.07*  CALCIUM 8.5* 8.6*  8.7 8.5* 7.9*  MG  --   --   --  2.7*  PHOS 5.4*  --  4.8*  --     Liver Function Tests: Recent Labs  Lab 02/14/23 0017 02/16/23 0857  AST 56*  --   ALT 70*  --   ALKPHOS 232*  --   BILITOT 0.9  --   PROT 9.2*  --   ALBUMIN 3.0*  2.4*   No results for input(s): "LIPASE", "AMYLASE" in the last 168 hours. No results for input(s): "AMMONIA" in the last 168 hours.  CBC: Recent Labs  Lab 02/14/23 0017 02/15/23 0834 02/16/23 0857  WBC 13.1* 7.4 6.4  NEUTROABS 10.4*  --  5.1  HGB 12.3* 10.6* 10.4*  HCT 38.3* 32.5* 32.6*  MCV 97.7 95.6 97.3  PLT 173 112* 139*    Cardiac Enzymes: No results for input(s): "CKTOTAL", "CKMB", "CKMBINDEX", "TROPONINI" in the last 168 hours.  BNP: Invalid input(s): "POCBNP"  CBG: No results for input(s): "GLUCAP" in the last 168 hours.  Microbiology: Results for orders placed or performed during the hospital encounter of 02/14/23  Resp panel by RT-PCR (RSV, Flu A&B, Covid) Anterior Nasal Swab     Status: None   Collection Time: 02/14/23 12:17 AM   Specimen: Anterior Nasal Swab  Result Value Ref Range Status   SARS Coronavirus 2 by RT PCR NEGATIVE NEGATIVE Final    Comment: (NOTE) SARS-CoV-2 target nucleic acids are NOT DETECTED.  The SARS-CoV-2 RNA is generally detectable in upper respiratory specimens during the acute phase of infection. The lowest concentration of SARS-CoV-2 viral copies this assay can detect is 138 copies/mL. A negative result does not preclude SARS-Cov-2 infection and should  not be used as the sole basis for treatment or other patient management decisions. A negative result may occur with  improper specimen collection/handling, submission of specimen other than nasopharyngeal swab, presence of viral mutation(s) within the areas targeted by this assay, and inadequate number of viral copies(<138 copies/mL). A negative result must be combined with clinical observations, patient history, and epidemiological information. The expected result is Negative.  Fact Sheet for Patients:  BloggerCourse.com  Fact Sheet for Healthcare Providers:  SeriousBroker.it  This test is no t yet approved or cleared by the  [place] FDA and  has been authorized for detection and/or diagnosis of SARS-CoV-2 by FDA under an Emergency Use Authorization (EUA). This EUA will remain  in effect (meaning this test can be used) for the duration of the COVID-19 declaration under Section 564(b)(1) of the Act, 21 U.S.C.section 360bbb-3(b)(1), unless the authorization is terminated  or revoked sooner.       Influenza A by PCR NEGATIVE NEGATIVE Final   Influenza B by PCR NEGATIVE NEGATIVE Final    Comment: (NOTE) The Xpert Xpress SARS-CoV-2/FLU/RSV plus assay is intended as an aid in the diagnosis of influenza from Nasopharyngeal swab specimens and should not be used as a sole basis for treatment. Nasal washings and aspirates are unacceptable for Xpert Xpress SARS-CoV-2/FLU/RSV testing.  Fact Sheet for Patients: BloggerCourse.com  Fact Sheet for Healthcare Providers: SeriousBroker.it  This test is not yet approved or cleared by the [place] FDA and has been authorized for detection and/or diagnosis of SARS-CoV-2 by FDA under an Emergency Use Authorization (EUA). This EUA will remain in effect (meaning this test can be used) for the duration of the COVID-19 declaration under Section 564(b)(1) of the Act, 21 U.S.C. section 360bbb-3(b)(1), unless the authorization is terminated or revoked.     Resp Syncytial Virus by PCR NEGATIVE NEGATIVE Final    Comment: (NOTE) Fact Sheet for Patients: BloggerCourse.com  Fact Sheet for Healthcare Providers: SeriousBroker.it  This test is not yet approved or cleared by the [place] FDA and has been authorized for detection and/or diagnosis of SARS-CoV-2 by FDA under an Emergency Use Authorization (EUA). This EUA will remain in effect (meaning this test can be used) for the duration of the COVID-19 declaration under Section 564(b)(1) of the Act, 21 U.S.C. section  360bbb-3(b)(1), unless the authorization is terminated or revoked.  Performed at [facility], [address] [place], [place] [address]   Culture, blood (Routine x 2)     Status: None (Preliminary result)   Collection Time: 02/14/23 12:30 AM   Specimen: BLOOD RIGHT ARM  Result Value Ref Range Status   Specimen Description BLOOD RIGHT ARM  Final   Special Requests   Final    BOTTLES DRAWN AEROBIC AND ANAEROBIC Blood Culture results may not be optimal due to an inadequate volume of blood received in culture bottles   Culture   Final    NO GROWTH 3 DAYS Performed at [facility], [address] [place], [place] [address]    Report Status PENDING  Incomplete  Culture, blood (Routine x 2)     Status: None (Preliminary result)   Collection Time: 02/14/23 12:46 AM   Specimen: BLOOD  Result Value Ref Range Status   Specimen Description BLOOD BLOOD RIGHT ARM  Final   Special Requests   Final    BOTTLES DRAWN AEROBIC AND ANAEROBIC Blood Culture adequate volume   Culture   Final    NO GROWTH 3 DAYS Performed  at [facility], [address] [place], [place] [address]    Report Status PENDING  Incomplete    Coagulation Studies: Recent Labs    02/15/23 0834  LABPROT 14.6  INR 1.1    Urinalysis: No results for input(s): "COLORURINE", "LABSPEC", "PHURINE", "GLUCOSEU", "HGBUR", "BILIRUBINUR", "KETONESUR", "PROTEINUR", "UROBILINOGEN", "NITRITE", "LEUKOCYTESUR" in the last 72 hours.  Invalid input(s): "APPERANCEUR"     Imaging: No results found.    Medications:    azithromycin 500 mg (02/16/23 2254)   cefTRIAXone (ROCEPHIN)  IV 2 g (02/16/23 2152)    aspirin EC  81 mg Oral Daily   cholecalciferol  2,000 Units Oral QHS   feeding supplement (NEPRO CARB STEADY)  237 mL Oral TID BM   heparin injection (subcutaneous)  5,000 Units Subcutaneous Q8H   ipratropium-albuterol  3 mL Nebulization TID   multivitamin  1 tablet Oral QHS   predniSONE  40  mg Oral Q breakfast   rOPINIRole  0.25 mg Oral QPM   sodium bicarbonate  1,300 mg Oral BID   acetaminophen **OR** acetaminophen, alum & mag hydroxide-simeth, ipratropium-albuterol, magnesium hydroxide, ondansetron **OR** ondansetron (ZOFRAN) IV, oxyCODONE-acetaminophen, traZODone  Assessment/ Plan:  [name] is a 68yo male PMH of ESRD on dialysis MWF at [facility] MWF, multiple myeloma, chronic back pain, CKD, COPD here with complaints of worsening shortness of breath and generalized weakness.  CXR shows left lower lobe airspace opacity concerning for pneumonia with stable right lower upper lobe pulmonary nodule.   [facility]/ MWF/ Lt AVF/ 85.5kg   ESRD on dialysis MWF      Dialysis received yesterday, UF achieved. Next treatment scheduled for Wednesday.   2. Sepsis due to pneumonia  Patient on IV Rocephin and Zithromax, mucolytic therapy and cultures remain negative   3. Anemia of chronic kidney disease             HgB 10.4 as of 02/16/23   4. Secondary hyperparathyroidism   2/8 Ca 8.5 Phos 5.4 and PTH collected    Calcium slightly decreased, 7.9. Will monitor for now   LOS: 3 Logan Baltimore 2/11/202512:17 PM

## 2023-02-18 ENCOUNTER — Other Ambulatory Visit (HOSPITAL_COMMUNITY): Payer: Self-pay

## 2023-02-18 ENCOUNTER — Telehealth (HOSPITAL_COMMUNITY): Payer: Self-pay | Admitting: Pharmacy Technician

## 2023-02-18 DIAGNOSIS — A419 Sepsis, unspecified organism: Secondary | ICD-10-CM | POA: Diagnosis not present

## 2023-02-18 DIAGNOSIS — J189 Pneumonia, unspecified organism: Secondary | ICD-10-CM | POA: Diagnosis not present

## 2023-02-18 LAB — CBC
HCT: 30.6 % — ABNORMAL LOW (ref 39.0–52.0)
Hemoglobin: 9.9 g/dL — ABNORMAL LOW (ref 13.0–17.0)
MCH: 31.6 pg (ref 26.0–34.0)
MCHC: 32.4 g/dL (ref 30.0–36.0)
MCV: 97.8 fL (ref 80.0–100.0)
Platelets: 115 10*3/uL — ABNORMAL LOW (ref 150–400)
RBC: 3.13 MIL/uL — ABNORMAL LOW (ref 4.22–5.81)
RDW: 15.2 % (ref 11.5–15.5)
WBC: 4.2 10*3/uL (ref 4.0–10.5)
nRBC: 0 % (ref 0.0–0.2)

## 2023-02-18 LAB — BASIC METABOLIC PANEL
Anion gap: 12 (ref 5–15)
BUN: 100 mg/dL — ABNORMAL HIGH (ref 8–23)
CO2: 24 mmol/L (ref 22–32)
Calcium: 8.2 mg/dL — ABNORMAL LOW (ref 8.9–10.3)
Chloride: 102 mmol/L (ref 98–111)
Creatinine, Ser: 7.31 mg/dL — ABNORMAL HIGH (ref 0.61–1.24)
GFR, Estimated: 8 mL/min — ABNORMAL LOW (ref >60)
Glucose, Bld: 110 mg/dL — ABNORMAL HIGH (ref 70–99)
Potassium: 5.3 mmol/L — ABNORMAL HIGH (ref 3.5–5.1)
Sodium: 138 mmol/L (ref 135–145)

## 2023-02-18 MED ORDER — FLUTICASONE-UMECLIDIN-VILANT 100-62.5-25 MCG/ACT IN AEPB: 1.0000 | INHALATION_SPRAY | Freq: Every day | RESPIRATORY_TRACT | 2 refills | Status: AC

## 2023-02-18 MED ORDER — IPRATROPIUM-ALBUTEROL 0.5-2.5 (3) MG/3ML IN SOLN: 3.0000 mL | Freq: Four times a day (QID) | RESPIRATORY_TRACT | 2 refills | Status: AC | PRN

## 2023-02-18 MED ORDER — LIDOCAINE-PRILOCAINE 2.5-2.5 % EX CREA: 1.0000 | TOPICAL_CREAM | CUTANEOUS | Status: DC | PRN

## 2023-02-18 MED ORDER — HEPARIN SODIUM (PORCINE) 1000 UNIT/ML DIALYSIS: 1000.0000 [IU] | INTRAMUSCULAR | Status: DC | PRN

## 2023-02-18 MED ORDER — PENTAFLUOROPROP-TETRAFLUOROETH EX AERO: 1.0000 | INHALATION_SPRAY | CUTANEOUS | Status: DC | PRN

## 2023-02-18 MED ORDER — OXYCODONE-ACETAMINOPHEN 5-325 MG PO TABS
ORAL_TABLET | ORAL | Status: AC
  Filled 2023-02-18: qty 1

## 2023-02-18 MED ORDER — AMOXICILLIN 500 MG PO TABS: 500.0000 mg | ORAL_TABLET | Freq: Three times a day (TID) | ORAL | Status: AC

## 2023-02-18 MED ORDER — PENTAFLUOROPROP-TETRAFLUOROETH EX AERO
INHALATION_SPRAY | CUTANEOUS | Status: AC
Start: 2023-02-18 — End: ?
  Filled 2023-02-18: qty 30

## 2023-02-18 NOTE — Progress Notes (Signed)
Hemodialysis Note:  Received patient in bed to unit. Alert and oriented. Informed consent singed and in chart.  Treatment initiated: 0824 Treatment completed: 1207  Access used: Left Fistula Access issues: None  Patient tolerated well. Transported back to room, alert without acute distress. Report given to patient's RN.  Total UF removed: 0.5 Liter Medications given: none  Post HD weight: 87 Kg  [facility]

## 2023-02-18 NOTE — Plan of Care (Signed)
  Problem: Fluid Volume: Goal: Hemodynamic stability will improve Outcome: Adequate for Discharge   Problem: Clinical Measurements: Goal: Diagnostic test results will improve Outcome: Adequate for Discharge Goal: Signs and symptoms of infection will decrease Outcome: Adequate for Discharge   Problem: Respiratory: Goal: Ability to maintain adequate ventilation will improve Outcome: Adequate for Discharge   Problem: Education: Goal: Knowledge of General Education information will improve Description: Including pain rating scale, medication(s)/side effects and non-pharmacologic comfort measures Outcome: Adequate for Discharge   Problem: Health Behavior/Discharge Planning: Goal: Ability to manage health-related needs will improve Outcome: Adequate for Discharge   Problem: Clinical Measurements: Goal: Ability to maintain clinical measurements within normal limits will improve Outcome: Adequate for Discharge Goal: Will remain free from infection Outcome: Adequate for Discharge Goal: Diagnostic test results will improve Outcome: Adequate for Discharge Goal: Respiratory complications will improve Outcome: Adequate for Discharge Goal: Cardiovascular complication will be avoided Outcome: Adequate for Discharge   Problem: Activity: Goal: Risk for activity intolerance will decrease Outcome: Adequate for Discharge   Problem: Nutrition: Goal: Adequate nutrition will be maintained Outcome: Adequate for Discharge   Problem: Coping: Goal: Level of anxiety will decrease Outcome: Adequate for Discharge   Problem: Elimination: Goal: Will not experience complications related to bowel motility Outcome: Adequate for Discharge Goal: Will not experience complications related to urinary retention Outcome: Adequate for Discharge   Problem: Pain Managment: Goal: General experience of comfort will improve and/or be controlled Outcome: Adequate for Discharge   Problem: Safety: Goal:  Ability to remain free from injury will improve Outcome: Adequate for Discharge   Problem: Skin Integrity: Goal: Risk for impaired skin integrity will decrease Outcome: Adequate for Discharge   Problem: Education: Goal: Knowledge of disease or condition will improve Outcome: Adequate for Discharge Goal: Knowledge of the prescribed therapeutic regimen will improve Outcome: Adequate for Discharge Goal: Individualized Educational Video(s) Outcome: Adequate for Discharge   Problem: Activity: Goal: Ability to tolerate increased activity will improve Outcome: Adequate for Discharge Goal: Will verbalize the importance of balancing activity with adequate rest periods Outcome: Adequate for Discharge   Problem: Respiratory: Goal: Ability to maintain a clear airway will improve Outcome: Adequate for Discharge Goal: Levels of oxygenation will improve Outcome: Adequate for Discharge Goal: Ability to maintain adequate ventilation will improve Outcome: Adequate for Discharge

## 2023-02-18 NOTE — Discharge Summary (Signed)
Physician Discharge Summary   Coleson Kant  male DOB: Dec 04, 1955  ZOX:[id number]  PCP: [facility]  Admit date: 02/14/2023 Discharge date: 02/18/2023  Admitted From: home Disposition:  home CODE STATUS: Full code  Discharge Instructions     No wound care   Complete by: As directed       Hospital Course:  For full details, please see H&P, progress notes, consult notes and ancillary notes.  Briefly,  Humberto Addo is a 68 y.o. male with medical history significant for ESRD on HD on MWF, multiple myeloma, chronic back pain, CKD, COPD, and tobacco abuse, presented to the emergency room with acute onset of worsening dyspnea over the last month with associated cough productive of yellowish sputum as well as wheezing and generalized weakness.   Respiratory panel came back negative  Portable chest x-ray showed left lower lobe airspace opacity concerning for pneumonia with stable right upper lobe pulmonary nodule.  Placed on Rocephin and Zithromax for pneumonia, prednisone for COPD exacerbation.  Patient is also scheduled on bronchodilators.  Sepsis due to pneumonia (HCC) Left lower lobe pneumonia. -Presented with tachycardia, tachypnea, leukocytosis --completed 5 days antibiotics with Rocephin and Zithromax.   COPD with acute exacerbation (HCC) Acute respiratory failure with hypoxia. Tobacco abuse. --received IV solumedrol f/b prednisone 40 mg daily for a 5-day steroid burst.  Pt was on room air prior to discharge. --pt not taking Trelegy PTA, to be resumed after discharge.  (Confirmed with pharm tech that copay is $0)    End-stage renal disease on hemodialysis (HCC) Hyperkalemia. Metabolic acidosis improved. --iHD per nephro.  Cleared for discharge by nephro.   Coronary artery disease --cont ASA and statin   Transaminitis, mild -Unclear etiology --resume statin after discharge    Discharge Diagnoses:  Principal Problem:   Sepsis due to  pneumonia [facility]) Active Problems:   COPD with acute exacerbation (HCC)   Acute respiratory failure with hypoxia (HCC)   End-stage renal disease on hemodialysis (HCC)   Coronary artery disease   Dyslipidemia   COPD exacerbation (HCC)   Thrombocytopenia (HCC)   Malnutrition of moderate degree   30 Day Unplanned Readmission Risk Score    Flowsheet Row ED to Hosp-Admission (Current) from 02/14/2023 in [facility]  30 Day Unplanned Readmission Risk Score (%) 26.01 Filed at 02/18/2023 0801       This score is the patient's risk of an unplanned readmission within 30 days of being discharged (0 -100%). The score is based on dignosis, age, lab data, medications, orders, and past utilization.   Low:  0-14.9   Medium: 15-21.9   High: 22-29.9   Extreme: 30 and above         Discharge Instructions:  Allergies as of 02/18/2023       Reactions   No Known Allergies         Medication List     STOP taking these medications    multivitamin with minerals Tabs tablet   sildenafil 100 MG tablet Commonly known as: VIAGRA       TAKE these medications    albuterol 108 (90 Base) MCG/ACT inhaler Commonly known as: VENTOLIN HFA Inhale 2 puffs into the lungs every 4 (four) hours as needed for wheezing or shortness of breath.   amoxicillin 500 MG tablet Commonly known as: AMOXIL Take 1 tablet (500 mg total) by mouth 3 (three) times daily. Prior to dental procedure, home med. What changed: additional instructions  Aspirin Low Dose 81 MG tablet Generic drug: aspirin EC Take 81 mg by mouth daily.   calcium acetate 667 MG capsule Commonly known as: PHOSLO Take 667 mg by mouth 2 (two) times daily.   cetirizine 10 MG tablet Commonly known as: ZYRTEC Take 10 mg by mouth daily.   fluticasone 50 MCG/ACT nasal spray Commonly known as: FLONASE Place 2 sprays into both nostrils daily as needed for allergies.   Fluticasone-Umeclidin-Vilant  100-62.5-25 MCG/ACT Aepb Commonly known as: Trelegy Ellipta Inhale 1 puff into the lungs daily. What changed: medication strength   furosemide 20 MG tablet Commonly known as: LASIX Take 20 mg by mouth 2 (two) times daily.   ipratropium-albuterol 0.5-2.5 (3) MG/3ML Soln Commonly known as: DUONEB Take 3 mLs by nebulization every 6 (six) hours as needed.   lidocaine-prilocaine cream Commonly known as: EMLA Apply 1 application topically as needed (prior to access for dialysis.).   montelukast 10 MG tablet Commonly known as: SINGULAIR Take 10 mg by mouth at bedtime.   multivitamin Tabs tablet Take 1 tablet by mouth daily.   oxyCODONE-acetaminophen 5-325 MG tablet Commonly known as: PERCOCET/ROXICET Take 1 tablet by mouth 4 (four) times daily as needed for moderate pain.   rOPINIRole 0.25 MG tablet Commonly known as: REQUIP Take 0.25 mg by mouth every morning.   rosuvastatin 10 MG tablet Commonly known as: CRESTOR Take 10 mg by mouth daily.   Vitamin D3 50 MCG (2000 UT) Tabs Take 2,000 Units by mouth at bedtime.         Follow-up Information     [facility] Follow up in 1 week(s).   Specialty: General Practice Contact information: [address] [place] [address] [phone]         Mertie Moores, MD Follow up in 1 month(s).   Specialty: Specialist Contact information: [address] [place] [address] [phone]                 Allergies  Allergen Reactions   No Known Allergies      The results of significant diagnostics from this hospitalization (including imaging, microbiology, ancillary and laboratory) are listed below for reference.   Consultations:   Procedures/Studies: DG Chest Port 1 View Result Date: 02/14/2023 CLINICAL DATA:  Shortness of breath, chest pain EXAM: PORTABLE CHEST 1 VIEW COMPARISON:  01/14/2022 FINDINGS: Heart and mediastinal contours within normal limits.  Left basilar airspace opacity. Right upper lobe nodule again noted, similar to prior study. No effusions or acute bony abnormality. IMPRESSION: Left lower lobe airspace opacity concerning for pneumonia. Stable right upper lobe pulmonary nodule. Electronically Signed   By: Charlett Nose M.D.   On: 02/14/2023 01:02      Labs: BNP (last 3 results) No results for input(s): "BNP" in the last 8760 hours. Basic Metabolic Panel: Recent Labs  Lab 02/14/23 0017 02/15/23 0834 02/16/23 0857 02/17/23 0234 02/18/23 0304  NA 137 134* 135 135 138  K 4.1 5.1 5.1 5.7* 5.3*  CL 97* 100 102 100 102  CO2 24 19* 18* 25 24  GLUCOSE 119* 132* 94 122* 110*  BUN 85* 102* 132* 84* 100*  CREATININE 7.63* 8.32* 8.67* 6.07* 7.31*  CALCIUM 8.5* 8.6*  8.7 8.5* 7.9* 8.2*  MG  --   --   --  2.7*  --   PHOS 5.4*  --  4.8*  --   --    Liver Function Tests: Recent Labs  Lab 02/14/23 0017  02/16/23 0857  AST 56*  --   ALT 70*  --   ALKPHOS 232*  --   BILITOT 0.9  --   PROT 9.2*  --   ALBUMIN 3.0* 2.4*   No results for input(s): "LIPASE", "AMYLASE" in the last 168 hours. No results for input(s): "AMMONIA" in the last 168 hours. CBC: Recent Labs  Lab 02/14/23 0017 02/15/23 0834 02/16/23 0857 02/18/23 0815  WBC 13.1* 7.4 6.4 4.2  NEUTROABS 10.4*  --  5.1  --   HGB 12.3* 10.6* 10.4* 9.9*  HCT 38.3* 32.5* 32.6* 30.6*  MCV 97.7 95.6 97.3 97.8  PLT 173 112* 139* 115*   Cardiac Enzymes: No results for input(s): "CKTOTAL", "CKMB", "CKMBINDEX", "TROPONINI" in the last 168 hours. BNP: Invalid input(s): "POCBNP" CBG: No results for input(s): "GLUCAP" in the last 168 hours. D-Dimer No results for input(s): "DDIMER" in the last 72 hours. Hgb A1c No results for input(s): "HGBA1C" in the last 72 hours. Lipid Profile No results for input(s): "CHOL", "HDL", "LDLCALC", "TRIG", "CHOLHDL", "LDLDIRECT" in the last 72 hours. Thyroid function studies No results for input(s): "TSH", "T4TOTAL", "T3FREE",  "THYROIDAB" in the last 72 hours.  Invalid input(s): "FREET3" Anemia work up No results for input(s): "VITAMINB12", "FOLATE", "FERRITIN", "TIBC", "IRON", "RETICCTPCT" in the last 72 hours. Urinalysis    Component Value Date/Time   COLORURINE YELLOW (A) 02/14/2023 0848   APPEARANCEUR CLEAR (A) 02/14/2023 0848   LABSPEC 1.012 02/14/2023 0848   PHURINE 6.0 02/14/2023 0848   GLUCOSEU 50 (A) 02/14/2023 0848   HGBUR MODERATE (A) 02/14/2023 0848   BILIRUBINUR NEGATIVE 02/14/2023 0848   KETONESUR NEGATIVE 02/14/2023 0848   PROTEINUR 100 (A) 02/14/2023 0848   NITRITE NEGATIVE 02/14/2023 0848   LEUKOCYTESUR NEGATIVE 02/14/2023 0848   Sepsis Labs Recent Labs  Lab 02/14/23 0017 02/15/23 0834 02/16/23 0857 02/18/23 0815  WBC 13.1* 7.4 6.4 4.2   Microbiology Recent Results (from the past 240 hours)  Resp panel by RT-PCR (RSV, Flu A&B, Covid) Anterior Nasal Swab     Status: None   Collection Time: 02/14/23 12:17 AM   Specimen: Anterior Nasal Swab  Result Value Ref Range Status   SARS Coronavirus 2 by RT PCR NEGATIVE NEGATIVE Final    Comment: (NOTE) SARS-CoV-2 target nucleic acids are NOT DETECTED.  The SARS-CoV-2 RNA is generally detectable in upper respiratory specimens during the acute phase of infection. The lowest concentration of SARS-CoV-2 viral copies this assay can detect is 138 copies/mL. A negative result does not preclude SARS-Cov-2 infection and should not be used as the sole basis for treatment or other patient management decisions. A negative result may occur with  improper specimen collection/handling, submission of specimen other than nasopharyngeal swab, presence of viral mutation(s) within the areas targeted by this assay, and inadequate number of viral copies(<138 copies/mL). A negative result must be combined with clinical observations, patient history, and epidemiological information. The expected result is Negative.  Fact Sheet for Patients:   BloggerCourse.com  Fact Sheet for Healthcare Providers:  SeriousBroker.it  This test is no t yet approved or cleared by the [place] FDA and  has been authorized for detection and/or diagnosis of SARS-CoV-2 by FDA under an Emergency Use Authorization (EUA). This EUA will remain  in effect (meaning this test can be used) for the duration of the COVID-19 declaration under Section 564(b)(1) of the Act, 21 U.S.C.section 360bbb-3(b)(1), unless the authorization is terminated  or revoked sooner.       Influenza A by PCR  NEGATIVE NEGATIVE Final   Influenza B by PCR NEGATIVE NEGATIVE Final    Comment: (NOTE) The Xpert Xpress SARS-CoV-2/FLU/RSV plus assay is intended as an aid in the diagnosis of influenza from Nasopharyngeal swab specimens and should not be used as a sole basis for treatment. Nasal washings and aspirates are unacceptable for Xpert Xpress SARS-CoV-2/FLU/RSV testing.  Fact Sheet for Patients: BloggerCourse.com  Fact Sheet for Healthcare Providers: SeriousBroker.it  This test is not yet approved or cleared by the [place] FDA and has been authorized for detection and/or diagnosis of SARS-CoV-2 by FDA under an Emergency Use Authorization (EUA). This EUA will remain in effect (meaning this test can be used) for the duration of the COVID-19 declaration under Section 564(b)(1) of the Act, 21 U.S.C. section 360bbb-3(b)(1), unless the authorization is terminated or revoked.     Resp Syncytial Virus by PCR NEGATIVE NEGATIVE Final    Comment: (NOTE) Fact Sheet for Patients: BloggerCourse.com  Fact Sheet for Healthcare Providers: SeriousBroker.it  This test is not yet approved or cleared by the [place] FDA and has been authorized for detection and/or diagnosis of SARS-CoV-2 by FDA under an Emergency Use  Authorization (EUA). This EUA will remain in effect (meaning this test can be used) for the duration of the COVID-19 declaration under Section 564(b)(1) of the Act, 21 U.S.C. section 360bbb-3(b)(1), unless the authorization is terminated or revoked.  Performed at [facility], [address] [place], [place] [address]   Culture, blood (Routine x 2)     Status: None (Preliminary result)   Collection Time: 02/14/23 12:30 AM   Specimen: BLOOD RIGHT ARM  Result Value Ref Range Status   Specimen Description BLOOD RIGHT ARM  Final   Special Requests   Final    BOTTLES DRAWN AEROBIC AND ANAEROBIC Blood Culture results may not be optimal due to an inadequate volume of blood received in culture bottles   Culture   Final    NO GROWTH 3 DAYS Performed at [facility], [address] [place], [place] [address]    Report Status PENDING  Incomplete  Culture, blood (Routine x 2)     Status: None (Preliminary result)   Collection Time: 02/14/23 12:46 AM   Specimen: BLOOD  Result Value Ref Range Status   Specimen Description BLOOD BLOOD RIGHT ARM  Final   Special Requests   Final    BOTTLES DRAWN AEROBIC AND ANAEROBIC Blood Culture adequate volume   Culture   Final    NO GROWTH 3 DAYS Performed at [facility], [address], [place], [place] [address]    Report Status PENDING  Incomplete     Total time spend on discharging this patient, including the last patient exam, discussing the hospital stay, instructions for ongoing care as it relates to all pertinent caregivers, as well as preparing the medical discharge records, prescriptions, and/or referrals as applicable, is 45 minutes.    Darlin Priestly, MD  [facility] 02/18/2023, 9:27 AM

## 2023-02-18 NOTE — Plan of Care (Signed)
  Problem: Respiratory: Goal: Ability to maintain adequate ventilation will improve Outcome: Progressing   Problem: Education: Goal: Knowledge of General Education information will improve Description: Including pain rating scale, medication(s)/side effects and non-pharmacologic comfort measures Outcome: Progressing   Problem: Clinical Measurements: Goal: Ability to maintain clinical measurements within normal limits will improve Outcome: Progressing   Problem: Safety: Goal: Ability to remain free from injury will improve Outcome: Progressing

## 2023-02-18 NOTE — Telephone Encounter (Signed)
Patient Product/process development scientist completed.    The patient is insured through [facility]. Patient has Medicare and is not eligible for a copay card, but may be able to apply for patient assistance or Medicare RX Payment Plan (Patient Must reach out to their plan, if eligible for payment plan), if available.    Ran test claim for [facility] and the current 30 day co-pay is $0.00.  Ran test claim for Trelegy Ellipta and the current 30 day co-pay is $0.00.  Ran test claim for Incruse Ellipta and the current 30 day co-pay is $0.00.  This test claim was processed through [facility]- copay amounts may vary at other pharmacies due to pharmacy/plan contracts, or as the patient moves through the different stages of their insurance plan.     Roland Earl, CPHT Pharmacy Technician III Certified Patient Advocate [facility] Patient Advocate Team Direct Number: [phone]  Fax: [phone]

## 2023-02-18 NOTE — Progress Notes (Signed)
[facility]  ROUNDING NOTE   Subjective:  [name] is a 68yo male PMH of ESRD on dialysis MWF at [facility] MWF, multiple myeloma, chronic back pain, CKD, COPD here with complaints of worsening shortness of breath and generalized weakness.  CXR shows left lower lobe airspace opacity concerning for pneumonia with stable right lower upper lobe pulmonary nodule.   Patient seen and evaluated during dialysis   HEMODIALYSIS FLOWSHEET:  Blood Flow Rate (mL/min): 349 mL/min Arterial Pressure (mmHg): -188.68 mmHg Venous Pressure (mmHg): 218.17 mmHg TMP (mmHg): 1.41 mmHg Ultrafiltration Rate (mL/min): 400 mL/min Dialysate Flow Rate (mL/min): 299 ml/min  States he's feeling well Able to ambulate in hallway Less cough  Objective:  Vital signs in last 24 hours:  Temp:  [97.6 F (36.4 C)-97.9 F (36.6 C)] 97.9 F (36.6 C) (02/12 0800) Pulse Rate:  [68-101] 78 (02/12 0930) Resp:  [16-20] 20 (02/12 0930) BP: (107-140)/(59-85) 109/64 (02/12 0930) SpO2:  [93 %-97 %] 95 % (02/12 0930) Weight:  [87.5 kg] 87.5 kg (02/12 0800)  Weight change:  Filed Weights   02/16/23 0847 02/16/23 1210 02/18/23 0800  Weight: 87.9 kg 87.6 kg 87.5 kg    Intake/Output: I/O last 3 completed shifts: In: 600 [P.O.:600] Out: 2325 [Urine:2325]   Intake/Output this shift:  No intake/output data recorded.  Physical Exam: General: NAD  Head: Normocephalic, atraumatic. Moist oral mucosal membranes  Eyes: Anicteric  Lungs:  Diminished and clear to auscultation, RA  Heart: Regular rate and rhythm  Abdomen:  Soft, nontender  Extremities:  no peripheral edema.  Neurologic: Alert, oriented, moving all four extremities  Skin: No lesions  Access: Left AVF    Basic Metabolic Panel: Recent Labs  Lab 02/14/23 0017 02/15/23 0834 02/16/23 0857 02/17/23 0234 02/18/23 0304  NA 137 134* 135 135 138  K 4.1 5.1 5.1 5.7* 5.3*  CL 97* 100 102 100 102  CO2 24 19* 18* 25 24  GLUCOSE  119* 132* 94 122* 110*  BUN 85* 102* 132* 84* 100*  CREATININE 7.63* 8.32* 8.67* 6.07* 7.31*  CALCIUM 8.5* 8.6*  8.7 8.5* 7.9* 8.2*  MG  --   --   --  2.7*  --   PHOS 5.4*  --  4.8*  --   --     Liver Function Tests: Recent Labs  Lab 02/14/23 0017 02/16/23 0857  AST 56*  --   ALT 70*  --   ALKPHOS 232*  --   BILITOT 0.9  --   PROT 9.2*  --   ALBUMIN 3.0* 2.4*   No results for input(s): "LIPASE", "AMYLASE" in the last 168 hours. No results for input(s): "AMMONIA" in the last 168 hours.  CBC: Recent Labs  Lab 02/14/23 0017 02/15/23 0834 02/16/23 0857 02/18/23 0815  WBC 13.1* 7.4 6.4 4.2  NEUTROABS 10.4*  --  5.1  --   HGB 12.3* 10.6* 10.4* 9.9*  HCT 38.3* 32.5* 32.6* 30.6*  MCV 97.7 95.6 97.3 97.8  PLT 173 112* 139* 115*    Cardiac Enzymes: No results for input(s): "CKTOTAL", "CKMB", "CKMBINDEX", "TROPONINI" in the last 168 hours.  BNP: Invalid input(s): "POCBNP"  CBG: No results for input(s): "GLUCAP" in the last 168 hours.  Microbiology: Results for orders placed or performed during the hospital encounter of 02/14/23  Resp panel by RT-PCR (RSV, Flu A&B, Covid) Anterior Nasal Swab     Status: None   Collection Time: 02/14/23 12:17 AM   Specimen: Anterior Nasal Swab  Result  Value Ref Range Status   SARS Coronavirus 2 by RT PCR NEGATIVE NEGATIVE Final    Comment: (NOTE) SARS-CoV-2 target nucleic acids are NOT DETECTED.  The SARS-CoV-2 RNA is generally detectable in upper respiratory specimens during the acute phase of infection. The lowest concentration of SARS-CoV-2 viral copies this assay can detect is 138 copies/mL. A negative result does not preclude SARS-Cov-2 infection and should not be used as the sole basis for treatment or other patient management decisions. A negative result may occur with  improper specimen collection/handling, submission of specimen other than nasopharyngeal swab, presence of viral mutation(s) within the areas targeted by  this assay, and inadequate number of viral copies(<138 copies/mL). A negative result must be combined with clinical observations, patient history, and epidemiological information. The expected result is Negative.  Fact Sheet for Patients:  BloggerCourse.com  Fact Sheet for Healthcare Providers:  SeriousBroker.it  This test is no t yet approved or cleared by the [place] FDA and  has been authorized for detection and/or diagnosis of SARS-CoV-2 by FDA under an Emergency Use Authorization (EUA). This EUA will remain  in effect (meaning this test can be used) for the duration of the COVID-19 declaration under Section 564(b)(1) of the Act, 21 U.S.C.section 360bbb-3(b)(1), unless the authorization is terminated  or revoked sooner.       Influenza A by PCR NEGATIVE NEGATIVE Final   Influenza B by PCR NEGATIVE NEGATIVE Final    Comment: (NOTE) The Xpert Xpress SARS-CoV-2/FLU/RSV plus assay is intended as an aid in the diagnosis of influenza from Nasopharyngeal swab specimens and should not be used as a sole basis for treatment. Nasal washings and aspirates are unacceptable for Xpert Xpress SARS-CoV-2/FLU/RSV testing.  Fact Sheet for Patients: BloggerCourse.com  Fact Sheet for Healthcare Providers: SeriousBroker.it  This test is not yet approved or cleared by the [place] FDA and has been authorized for detection and/or diagnosis of SARS-CoV-2 by FDA under an Emergency Use Authorization (EUA). This EUA will remain in effect (meaning this test can be used) for the duration of the COVID-19 declaration under Section 564(b)(1) of the Act, 21 U.S.C. section 360bbb-3(b)(1), unless the authorization is terminated or revoked.     Resp Syncytial Virus by PCR NEGATIVE NEGATIVE Final    Comment: (NOTE) Fact Sheet for Patients: BloggerCourse.com  Fact Sheet  for Healthcare Providers: SeriousBroker.it  This test is not yet approved or cleared by the [place] FDA and has been authorized for detection and/or diagnosis of SARS-CoV-2 by FDA under an Emergency Use Authorization (EUA). This EUA will remain in effect (meaning this test can be used) for the duration of the COVID-19 declaration under Section 564(b)(1) of the Act, 21 U.S.C. section 360bbb-3(b)(1), unless the authorization is terminated or revoked.  Performed at [facility], [address] [place], [place] [address]   Culture, blood (Routine x 2)     Status: None (Preliminary result)   Collection Time: 02/14/23 12:30 AM   Specimen: BLOOD RIGHT ARM  Result Value Ref Range Status   Specimen Description BLOOD RIGHT ARM  Final   Special Requests   Final    BOTTLES DRAWN AEROBIC AND ANAEROBIC Blood Culture results may not be optimal due to an inadequate volume of blood received in culture bottles   Culture   Final    NO GROWTH 3 DAYS Performed at [facility], [address], [place], [place] [address]    Report Status PENDING  Incomplete  Culture, blood (Routine x 2)  Status: None (Preliminary result)   Collection Time: 02/14/23 12:46 AM   Specimen: BLOOD  Result Value Ref Range Status   Specimen Description BLOOD BLOOD RIGHT ARM  Final   Special Requests   Final    BOTTLES DRAWN AEROBIC AND ANAEROBIC Blood Culture adequate volume   Culture   Final    NO GROWTH 3 DAYS Performed at [facility], [address], [place], [place] [address]    Report Status PENDING  Incomplete    Coagulation Studies: No results for input(s): "LABPROT", "INR" in the last 72 hours.   Urinalysis: No results for input(s): "COLORURINE", "LABSPEC", "PHURINE", "GLUCOSEU", "HGBUR", "BILIRUBINUR", "KETONESUR", "PROTEINUR", "UROBILINOGEN", "NITRITE", "LEUKOCYTESUR" in the last 72 hours.  Invalid input(s): "APPERANCEUR"     Imaging: No  results found.    Medications:      aspirin EC  81 mg Oral Daily   cholecalciferol  2,000 Units Oral QHS   feeding supplement (NEPRO CARB STEADY)  237 mL Oral TID BM   heparin injection (subcutaneous)  5,000 Units Subcutaneous Q8H   ipratropium-albuterol  3 mL Nebulization TID   multivitamin  1 tablet Oral QHS   predniSONE  40 mg Oral Q breakfast   rOPINIRole  0.25 mg Oral QPM   acetaminophen **OR** acetaminophen, alum & mag hydroxide-simeth, heparin, ipratropium-albuterol, lidocaine-prilocaine, magnesium hydroxide, ondansetron **OR** ondansetron (ZOFRAN) IV, oxyCODONE-acetaminophen, pentafluoroprop-tetrafluoroeth, traZODone  Assessment/ Plan:  Nathaniel [name] is a 68yo male PMH of ESRD on dialysis MWF at [facility] MWF, multiple myeloma, chronic back pain, CKD, COPD here with complaints of worsening shortness of breath and generalized weakness.  CXR shows left lower lobe airspace opacity concerning for pneumonia with stable right lower upper lobe pulmonary nodule.   [facility]/ MWF/ Lt AVF/ 85.5kg   ESRD on dialysis MWF     Receiving dialysis today, UF 0.5L as tolerated. Next treatment scheduled for Friday.    2. Sepsis due to pneumonia  Patient on IV Rocephin and Zithromax, mucolytic therapy and cultures remain negative   3. Anemia of chronic kidney disease             Hgb 9.9, at goal.    4. Secondary hyperparathyroidism   2/8 Ca 8.5 Phos 5.4 and PTH 66    Will continue to monitor bone minerals.    LOS: 4 Nathaniel [name] 2/12/20259:46 AM

## 2023-02-19 LAB — CULTURE, BLOOD (ROUTINE X 2)
Culture: NO GROWTH
Culture: NO GROWTH
Special Requests: ADEQUATE

## 2023-03-13 ENCOUNTER — Other Ambulatory Visit: Payer: Self-pay | Admitting: Gastroenterology

## 2023-03-13 DIAGNOSIS — R7989 Other specified abnormal findings of blood chemistry: Secondary | ICD-10-CM

## 2023-03-19 ENCOUNTER — Ambulatory Visit
Admission: RE | Admit: 2023-03-19 | Discharge: 2023-03-19 | Disposition: A | Discharge status: Home / Self Care | Source: Ambulatory Visit | Attending: Gastroenterology | Admitting: Gastroenterology

## 2023-03-19 DIAGNOSIS — R7989 Other specified abnormal findings of blood chemistry: Secondary | ICD-10-CM | POA: Diagnosis present

## 2023-03-25 ENCOUNTER — Other Ambulatory Visit: Payer: Self-pay | Admitting: Specialist

## 2023-03-25 DIAGNOSIS — J449 Chronic obstructive pulmonary disease, unspecified: Secondary | ICD-10-CM

## 2023-03-25 DIAGNOSIS — F17210 Nicotine dependence, cigarettes, uncomplicated: Secondary | ICD-10-CM

## 2023-04-02 ENCOUNTER — Ambulatory Visit
Admission: RE | Admit: 2023-04-02 | Discharge: 2023-04-02 | Disposition: A | Discharge status: Home / Self Care | Source: Ambulatory Visit | Attending: Specialist | Admitting: Specialist

## 2023-04-02 DIAGNOSIS — F17210 Nicotine dependence, cigarettes, uncomplicated: Secondary | ICD-10-CM | POA: Insufficient documentation

## 2023-04-02 DIAGNOSIS — J449 Chronic obstructive pulmonary disease, unspecified: Secondary | ICD-10-CM | POA: Diagnosis present

## 2023-04-29 ENCOUNTER — Other Ambulatory Visit (INDEPENDENT_AMBULATORY_CARE_PROVIDER_SITE_OTHER): Payer: Self-pay | Admitting: Vascular Surgery

## 2023-04-29 DIAGNOSIS — N186 End stage renal disease: Secondary | ICD-10-CM

## 2023-04-30 ENCOUNTER — Ambulatory Visit (INDEPENDENT_AMBULATORY_CARE_PROVIDER_SITE_OTHER): Admitting: Nurse Practitioner

## 2023-04-30 ENCOUNTER — Ambulatory Visit (INDEPENDENT_AMBULATORY_CARE_PROVIDER_SITE_OTHER)

## 2023-04-30 ENCOUNTER — Encounter (INDEPENDENT_AMBULATORY_CARE_PROVIDER_SITE_OTHER): Payer: Self-pay | Admitting: Nurse Practitioner

## 2023-04-30 VITALS — BP 119/59 | HR 67 | Resp 17 | Wt 201.0 lb

## 2023-04-30 DIAGNOSIS — N186 End stage renal disease: Secondary | ICD-10-CM

## 2023-04-30 DIAGNOSIS — E782 Mixed hyperlipidemia: Secondary | ICD-10-CM

## 2023-04-30 DIAGNOSIS — I10 Essential (primary) hypertension: Secondary | ICD-10-CM

## 2023-04-30 NOTE — Progress Notes (Signed)
 Subjective:    Patient ID: Nathaniel [name], male    DOB: 1955/04/29, 68 y.o.   MRN: [id number] Chief Complaint  Patient presents with   Venous Insufficiency    Nathaniel [name] is a 68 year old male who presents today for evaluation of his left brachiocephalic AV fistula.  This fistula was placed in 2020.  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  There is no evidence of skin threatening in this area.  He notes that several months ago he had an incident where he was lifting over 100 pounds and it became swollen and painful and uncomfortable.  After a few days this subsided and he has not had any further issues from it.  Today he has a flow volume of 2811.  His previous flow volume was 3891.    Review of Systems  All other systems reviewed and are negative.      Objective:   Physical Exam Vitals reviewed.  HENT:     Head: Normocephalic.  Cardiovascular:     Rate and Rhythm: Normal rate.     Pulses: Normal pulses.          Radial pulses are 2+ on the left side.     Arteriovenous access: Left arteriovenous access is present.    Comments: Good thrill and bruit Pulmonary:     Effort: Pulmonary effort is normal.  Skin:    General: Skin is warm and dry.  Neurological:     Mental Status: He is alert and oriented to person, place, and time.  Psychiatric:        Mood and Affect: Mood normal.        Behavior: Behavior normal.        Thought Content: Thought content normal.        Judgment: Judgment normal.     BP (!) 119/59 (BP Location: Right Arm, Patient Position: Sitting)   Pulse 67   Resp 17   Wt 201 lb (91.2 kg)   BMI 27.26 kg/m   Past Medical History:  Diagnosis Date   Anemia    Arthritis    Chronic back pain    Dr. Donnette Gal manages (per pt)   Chronic kidney disease    COPD (chronic obstructive pulmonary disease) (HCC)    Dyspnea    with exertion   GERD (gastroesophageal reflux disease)    h/o   Hearing loss    Hyperlipidemia    MGUS (monoclonal  gammopathy of unknown significance)    Multiple myeloma (HCC) 2011   and prostate ca in 2019   Proteinuria    Tobacco abuse     Social History   Socioeconomic History   Marital status: Married    Spouse name: Not on file   Number of children: 2   Years of education: Not on file   Highest education level: Not on file  Occupational History   Not on file  Tobacco Use   Smoking status: Former    Current packs/day: 0.00    Average packs/day: 2.0 packs/day for 50.0 years (100.0 ttl pk-yrs)    Types: Cigarettes    Start date: 07/1970    Quit date: 07/2020    Years since quitting: 2.8    Passive exposure: Current   Smokeless tobacco: Never  Vaping Use   Vaping status: Never Used  Substance and Sexual Activity   Alcohol use: No    Alcohol/week: 0.0 standard drinks of alcohol   Drug use: No   Sexual activity:  Yes  Other Topics Concern   Not on file  Social History Narrative   Disabled, 2 sons-healthy   Social Drivers of Health   Financial Resource Strain: Low Risk  (03/19/2023)   Received from [facility]   Overall Financial Resource Strain (CARDIA)    Difficulty of Paying Living Expenses: Not hard at all  Recent Concern: Financial Resource Strain - Medium Risk (03/12/2023)   Received from [facility]   Overall Financial Resource Strain (CARDIA)    Difficulty of Paying Living Expenses: Somewhat hard  Food Insecurity: No Food Insecurity (03/19/2023)   Received from [facility]   Hunger Vital Sign    Worried About Running Out of Food in the Last Year: Never true    Ran Out of Food in the Last Year: Never true  Transportation Needs: No Transportation Needs (03/19/2023)   Received from [facility] - [facility]    In the past 12 months, has lack of transportation kept you from medical appointments or from getting medications?: No    Lack of Transportation (Non-Medical): No  Physical Activity:  Not on file  Stress: Not on file  Social Connections: Unknown (02/15/2023)   Social Connection and Isolation Panel [NHANES]    Frequency of Communication with Friends and Family: Once a week    Frequency of Social Gatherings with Friends and Family: Once a week    Attends Religious Services: Not on file    Active Member of Clubs or Organizations: Not on file    Attends Banker Meetings: Not on file    Marital Status: Married  Intimate Partner Violence: Not At Risk (02/15/2023)   Humiliation, Afraid, Rape, and Kick questionnaire    Fear of Current or Ex-Partner: No    Emotionally Abused: No    Physically Abused: No    Sexually Abused: No    Past Surgical History:  Procedure Laterality Date   A/V FISTULAGRAM Left 01/18/2019   Procedure: A/V FISTULAGRAM;  Surgeon: Jackquelyn Mass, MD;  Location: [facility] INV[facility]IVE CV [facility];  Service: Cardiovascular;  Laterality: Left;   A/V FISTULAGRAM Left 12/03/2021   Procedure: A/V Fistulagram;  Surgeon: Jackquelyn Mass, MD;  Location: [facility] CV [facility];  Service: Cardiovascular;  Laterality: Left;   AV FISTULA PLACEMENT Left 10/29/2018   Procedure: ARTERIOVENOUS (AV) FISTULA CREATION ( BRACHIAL CEPHALIC);  Surgeon: Jackquelyn Mass, MD;  Location: [facility] ORS;  Service: Vascular;  Laterality: Left;   COLONOSCOPY WITH PROPOFOL  N/A 07/04/2014   Procedure: COLONOSCOPY WITH PROPOFOL ;  Surgeon: Marnee Sink, MD;  Location: [facility] ENDOSCOPY;  Service: Endoscopy;  Laterality: N/A;   COLONOSCOPY WITH PROPOFOL  N/A 12/10/2021   Procedure: COLONOSCOPY WITH PROPOFOL ;  Surgeon: Marnee Sink, MD;  Location: [facility] END[facility]COPY;  Service: Endoscopy;  Laterality: N/A;   TONSILLECTOMY     as child    Family History  Problem Relation Age of Onset   Heart disease Brother 2       MI   Colon cancer Neg Hx    Liver disease Neg Hx    Prostate cancer Neg Hx    Kidney cancer Neg Hx    Bladder Cancer Neg Hx     Allergies  Allergen Reactions   No Known Allergies         Latest Ref Rng & Units 02/18/2023    8:15 AM 02/16/2023    8:57 AM 02/15/2023    8:34 AM  CBC  WBC 4.0 -  10.5 K/uL 4.2  6.4  7.4   Hemoglobin 13.0 - 17.0 g/dL 9.9  16.1  09.6   Hematocrit 39.0 - 52.0 % 30.6  32.6  32.5   Platelets 150 - 400 K/uL 115  139  112       CMP     Component Value Date/Time   NA 138 02/18/2023 0304   K 5.3 (H) 02/18/2023 0304   CL 102 02/18/2023 0304   CO2 24 02/18/2023 0304   GLUCOSE 110 (H) 02/18/2023 0304   BUN 100 (H) 02/18/2023 0304   CREATININE 7.31 (H) 02/18/2023 0304   CALCIUM  8.2 (L) 02/18/2023 0304   CALCIUM  8.7 02/15/2023 0834   PROT 9.2 (H) 02/14/2023 0017   ALBUMIN 2.4 (L) 02/16/2023 0857   AST 56 (H) 02/14/2023 0017   ALT 70 (H) 02/14/2023 0017   ALKPHOS 232 (H) 02/14/2023 0017   BILITOT 0.9 02/14/2023 0017   GFRNONAA 8 (L) 02/18/2023 0304     No results found.     Assessment & Plan:   1. End stage renal disease (HCC) (Primary) Recommend:  The patient is doing well and currently has adequate dialysis access. The patient's dialysis center is not reporting any access issues. Flow pattern is stable when compared to the prior ultrasound.  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  We discussed revision versus close monitoring and follow-up.  The patient should have a duplex ultrasound of the dialysis access in 6 months. The patient will follow-up with me in the office after each ultrasound    2. Benign essential HTN Continue antihypertensive medications as already ordered, these medications have been reviewed and there are no changes at this time.  3. Mixed hyperlipidemia Continue statin as ordered and reviewed, no changes at this time   Current Outpatient Medications on File Prior to Visit  Medication Sig Dispense Refill   albuterol  (VENTOLIN  HFA) 108 (90 Base) MCG/ACT inhaler Inhale 2 puffs  into the lungs every 4 (four) hours as needed for wheezing or shortness of breath. 8 g 0   ASPIRIN  LOW DOSE 81 MG tablet Take 81 mg by mouth daily.     calcium  acetate (PHOSLO) 667 MG capsule Take 667 mg by mouth 2 (two) times daily.     cetirizine (ZYRTEC) 10 MG tablet Take 10 mg by mouth daily.     Cholecalciferol  (VITAMIN D3) 50 MCG (2000 UT) TABS Take 2,000 Units by mouth at bedtime.     fluticasone  (FLONASE) 50 MCG/ACT nasal spray Place 2 sprays into both nostrils daily as needed for allergies.     Fluticasone -Umeclidin-Vilant (TRELEGY ELLIPTA ) 100-62.5-25 MCG/ACT AEPB Inhale 1 puff into the lungs daily. 1 each 2   furosemide  (LASIX ) 20 MG tablet Take 20 mg by mouth 2 (two) times daily.     ipratropium-albuterol  (DUONEB) 0.5-2.5 (3) MG/3ML SOLN Take 3 mLs by nebulization every 6 (six) hours as needed. 120 mL 2   lidocaine -prilocaine  (EMLA ) cream Apply 1 application topically as needed (prior to access for dialysis.).     montelukast (SINGULAIR) 10 MG tablet Take 10 mg by mouth at bedtime.     multivitamin (RENA-VIT) TABS tablet Take 1 tablet by mouth daily.     oxyCODONE -acetaminophen  (PERCOCET/ROXICET) 5-325 MG tablet Take 1 tablet by mouth 4 (four) times daily as needed for moderate pain.      rOPINIRole  (REQUIP ) 0.25 MG  tablet Take 0.25 mg by mouth every morning.     amoxicillin  (AMOXIL ) 500 MG tablet Take 1 tablet (500 mg total) by mouth 3 (three) times daily. Prior to dental procedure, home med. (Patient not taking: Reported on 04/30/2023)     rosuvastatin (CRESTOR) 10 MG tablet Take 10 mg by mouth daily. (Patient not taking: Reported on 02/14/2023)     No current facility-administered medications on file prior to visit.    There are no Patient Instructions on file for this visit. No follow-ups on file.   Mitul Hallowell E Maleigh Bagot, NP

## 2023-05-26 ENCOUNTER — Encounter (INDEPENDENT_AMBULATORY_CARE_PROVIDER_SITE_OTHER): Payer: Self-pay

## 2023-07-01 IMAGING — CR DG CHEST 2V
1 series · 2 of 2 positions shown · non-contrast
Comparison: Chest x-ray 06/04/2020.

CLINICAL DATA: 66-year-old male with history of productive cough
and shortness of breath.

EXAM:
CHEST - 2 VIEW

[Series 1: dg chest 2 view · 0.14mm/px · 2 of 2 slices shown]
[im 1/2]
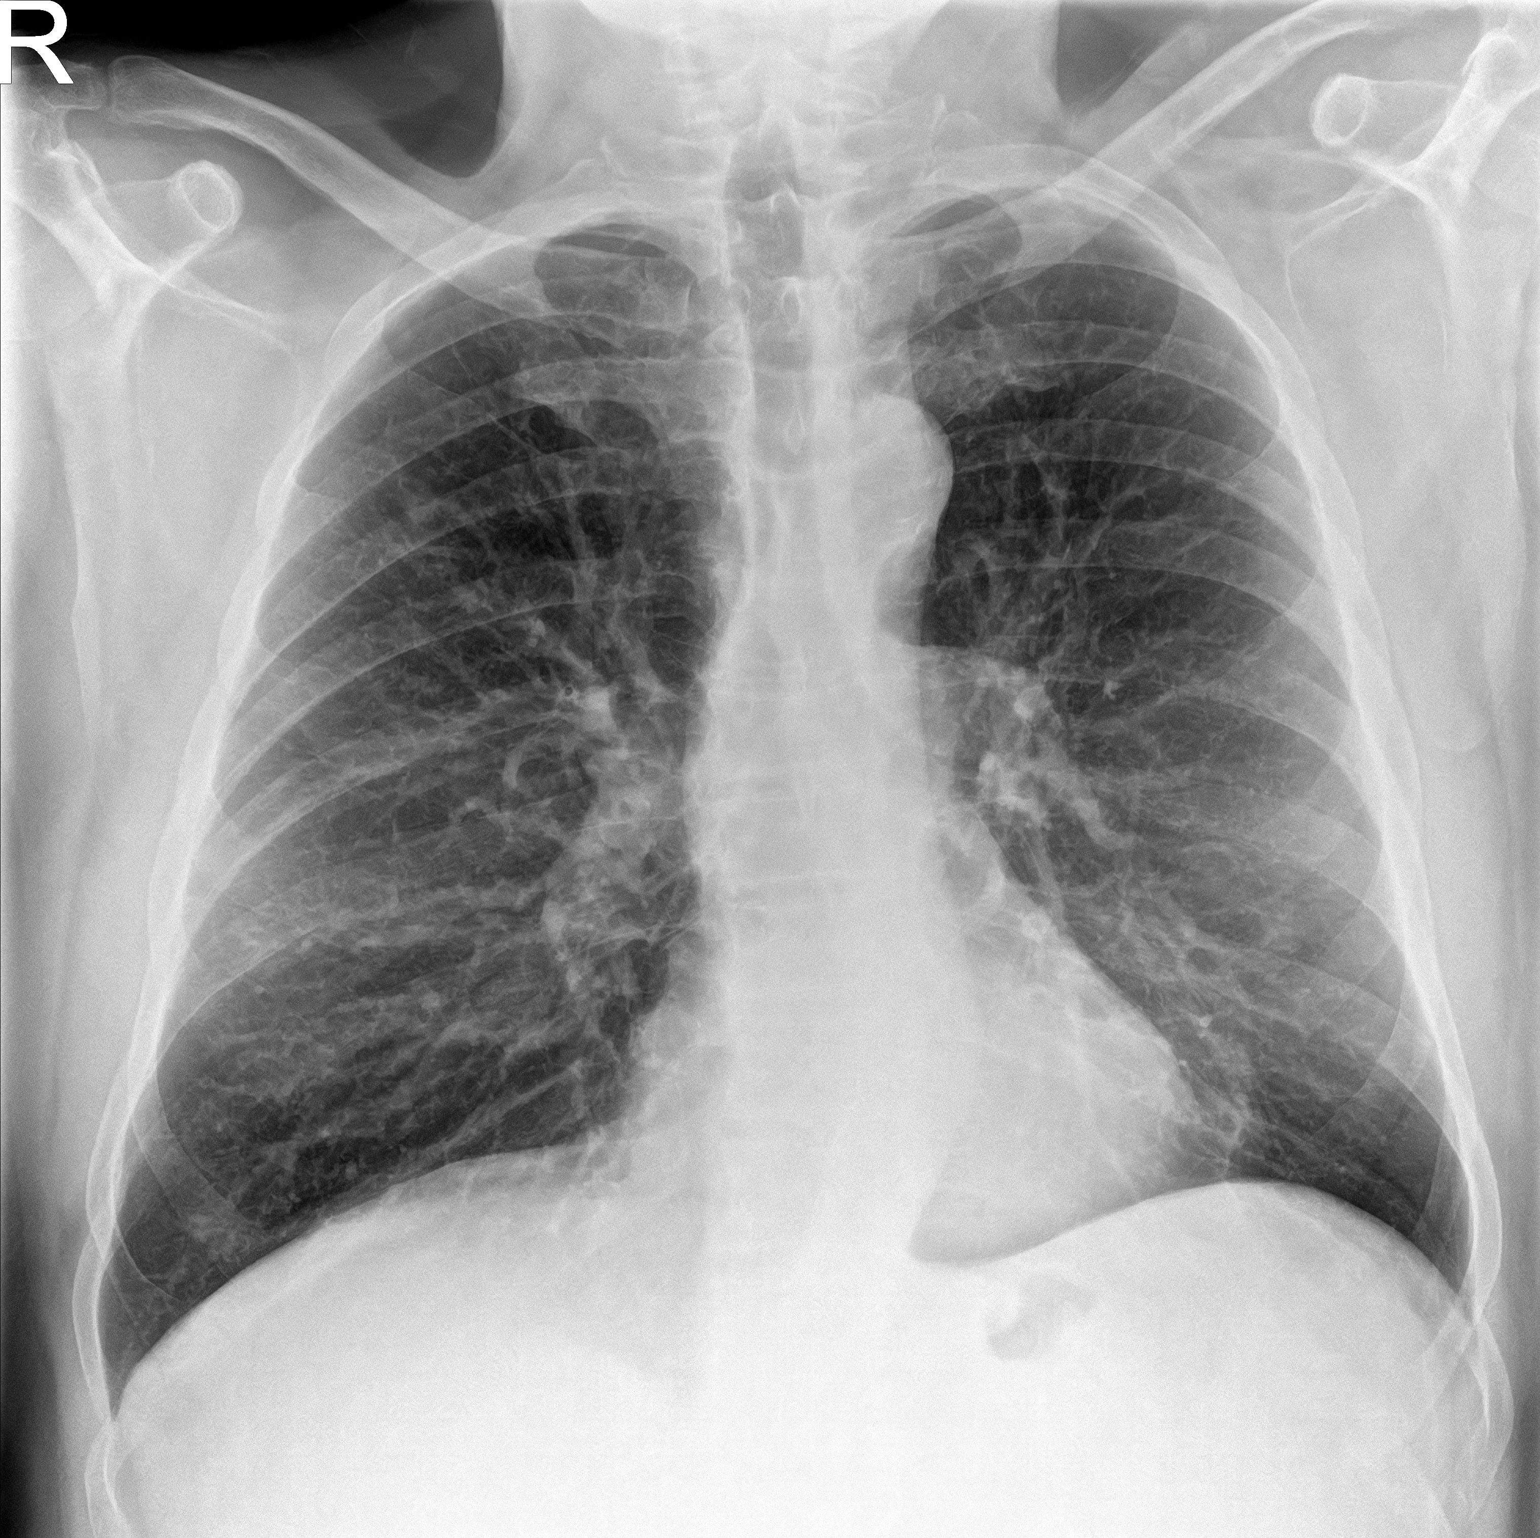
[im 2/2]
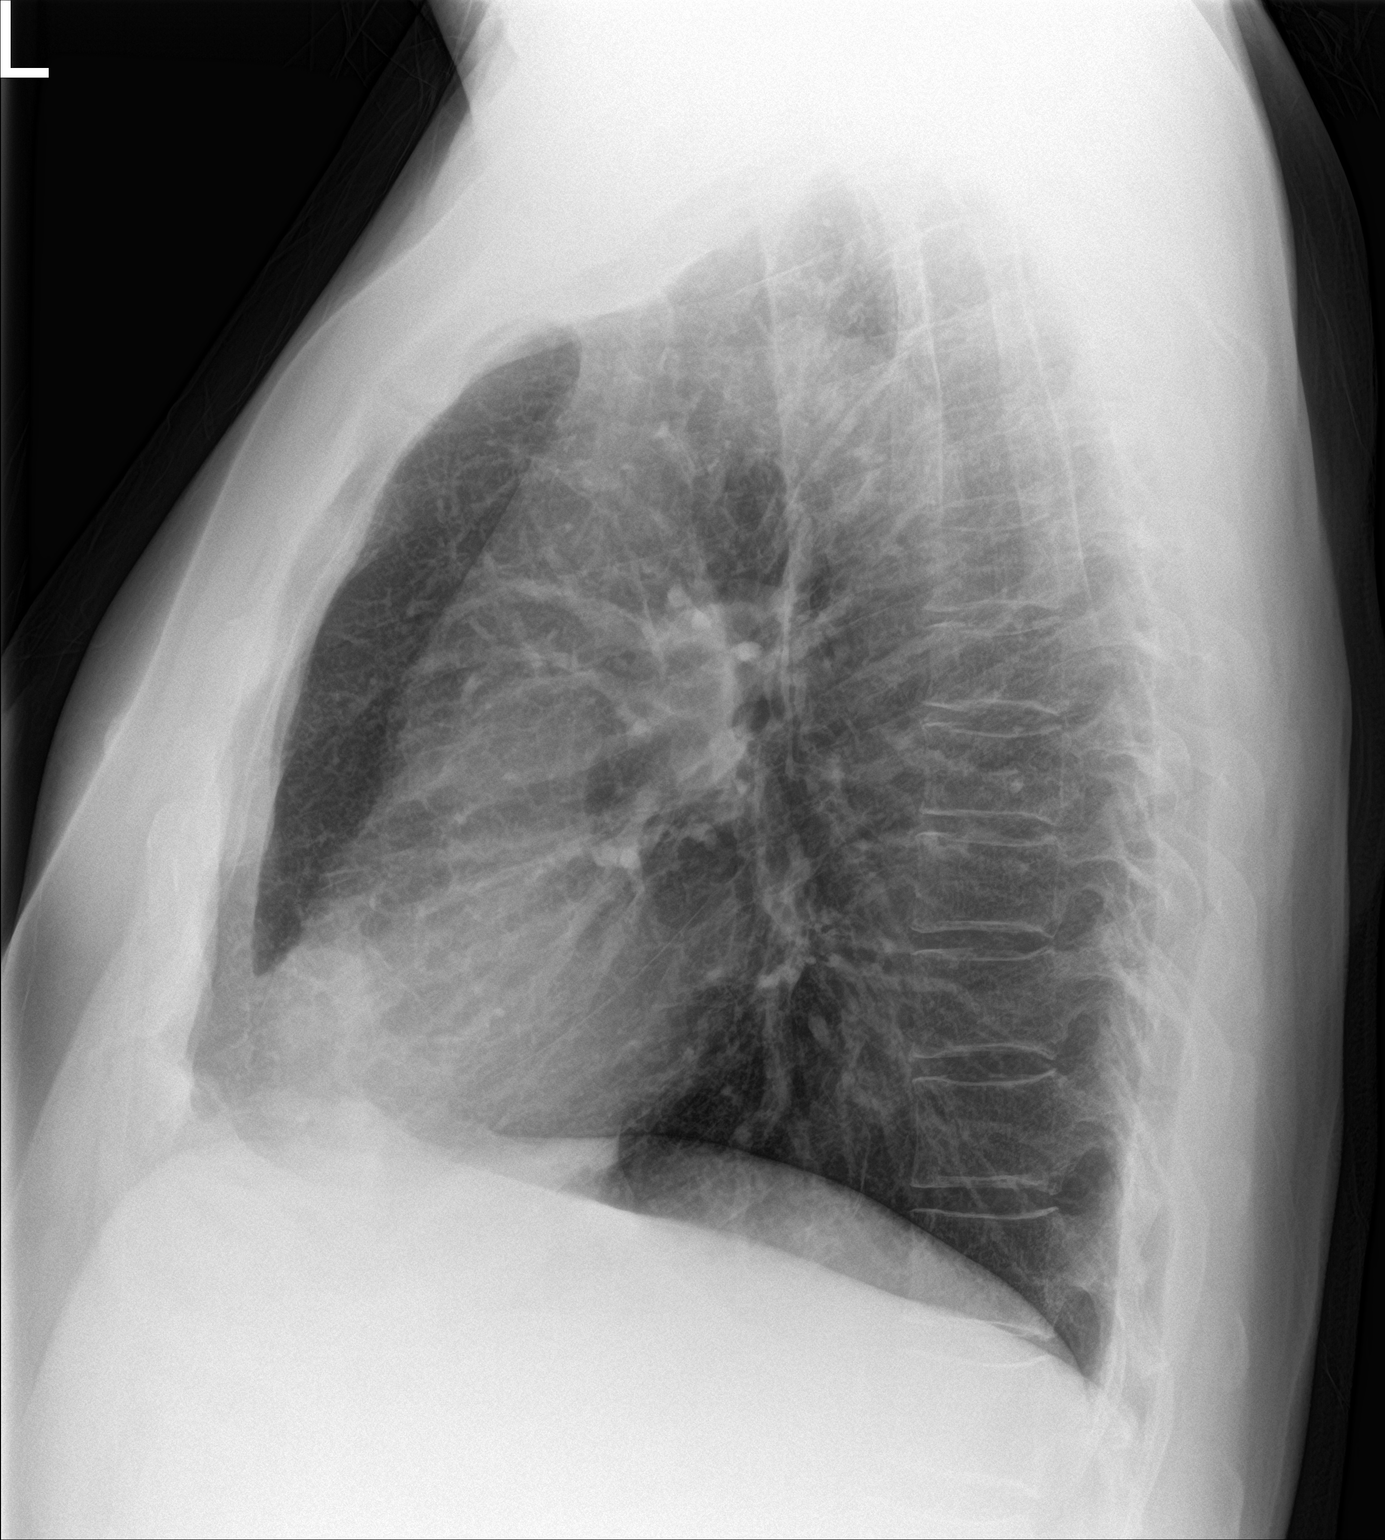

[2 of 2 positions shown; findings below may reference images not displayed]

FINDINGS: Lung volumes are increased with emphysematous changes. No
consolidative airspace disease. No pleural effusions. No
pneumothorax. Small area of ill-defined nodularity noted in the
right upper lobe, new compared to the prior study. Pulmonary
vasculature and the cardiomediastinal silhouette are within normal
limits. Atherosclerosis in the thoracic aorta.
IMPRESSION: 1. Small ill-defined area of nodularity in the right upper lobe, new
compared to the prior study, nonspecific. This could be of
infectious or inflammatory etiology, however, underlying neoplasm is
not excluded, particularly given the patient's history of smoking.
Followup PA and lateral chest X-ray is recommended in 3-4 weeks
following trial of antibiotic therapy to ensure resolution and
exclude underlying malignancy.
2. Aortic atherosclerosis.
3. Emphysema.

## 2023-09-02 ENCOUNTER — Other Ambulatory Visit: Payer: Self-pay | Admitting: Family Medicine

## 2023-09-02 DIAGNOSIS — Z136 Encounter for screening for cardiovascular disorders: Secondary | ICD-10-CM

## 2023-09-02 DIAGNOSIS — Z87891 Personal history of nicotine dependence: Secondary | ICD-10-CM

## 2023-09-09 ENCOUNTER — Ambulatory Visit
Admission: RE | Admit: 2023-09-09 | Discharge: 2023-09-09 | Disposition: A | Discharge status: Home / Self Care | Source: Ambulatory Visit | Attending: Family Medicine | Admitting: Family Medicine

## 2023-09-09 DIAGNOSIS — Z136 Encounter for screening for cardiovascular disorders: Secondary | ICD-10-CM | POA: Insufficient documentation

## 2023-09-09 DIAGNOSIS — Z87891 Personal history of nicotine dependence: Secondary | ICD-10-CM | POA: Insufficient documentation

## 2023-10-27 ENCOUNTER — Other Ambulatory Visit (INDEPENDENT_AMBULATORY_CARE_PROVIDER_SITE_OTHER): Payer: Self-pay | Admitting: Vascular Surgery

## 2023-10-27 DIAGNOSIS — N186 End stage renal disease: Secondary | ICD-10-CM

## 2023-10-29 ENCOUNTER — Encounter (INDEPENDENT_AMBULATORY_CARE_PROVIDER_SITE_OTHER): Payer: Self-pay | Admitting: Nurse Practitioner

## 2023-10-29 ENCOUNTER — Ambulatory Visit (INDEPENDENT_AMBULATORY_CARE_PROVIDER_SITE_OTHER): Admitting: Nurse Practitioner

## 2023-10-29 ENCOUNTER — Ambulatory Visit (INDEPENDENT_AMBULATORY_CARE_PROVIDER_SITE_OTHER)

## 2023-10-29 VITALS — BP 131/69 | HR 77 | Resp 18 | Ht 72.0 in | Wt 204.4 lb

## 2023-10-29 DIAGNOSIS — E782 Mixed hyperlipidemia: Secondary | ICD-10-CM

## 2023-10-29 DIAGNOSIS — I10 Essential (primary) hypertension: Secondary | ICD-10-CM | POA: Diagnosis not present

## 2023-10-29 DIAGNOSIS — N186 End stage renal disease: Secondary | ICD-10-CM

## 2023-11-01 ENCOUNTER — Encounter (INDEPENDENT_AMBULATORY_CARE_PROVIDER_SITE_OTHER): Payer: Self-pay | Admitting: Nurse Practitioner

## 2023-11-01 NOTE — Progress Notes (Signed)
 Subjective:    Patient ID: Nathaniel [name], male    DOB: Aug 15, 1955, 68 y.o.   MRN: [id number] Chief Complaint  Patient presents with   Follow-up    6 mont follow up HDA    Nathaniel [name] is a 68 year old male who presents today for evaluation of his left brachiocephalic AV fistula.  This fistula was placed in 2020.  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  There is no evidence of skin threatening in this area.  He notes that several months ago he had an incident where he was lifting over 100 pounds and it became swollen and painful and uncomfortable.  After a few days this subsided and he has not had any further issues from it.  Today he has a flow volume of 2911.  His previous flow volume was 2811.    Review of Systems  All other systems reviewed and are negative.      Objective:   Physical Exam Vitals reviewed.  HENT:     Head: Normocephalic.  Cardiovascular:     Rate and Rhythm: Normal rate.     Pulses: Normal pulses.          Radial pulses are 2+ on the left side.     Arteriovenous access: Left arteriovenous access is present.     Comments: Good thrill and bruit Pulmonary:     Effort: Pulmonary effort is normal.  Skin:    General: Skin is warm and dry.  Neurological:     Mental Status: He is alert and oriented to person, place, and time.  Psychiatric:        Mood and Affect: Mood normal.        Behavior: Behavior normal.        Thought Content: Thought content normal.        Judgment: Judgment normal.     BP 131/69 (BP Location: Right Arm, Patient Position: Sitting, Cuff Size: Normal)   Pulse 77   Resp 18   Ht 6' (1.829 m)   Wt 204 lb 6.4 oz (92.7 kg)   BMI 27.72 kg/m   Past Medical History:  Diagnosis Date   Anemia    Arthritis    Chronic back pain    Dr. Geofm manages (per pt)   Chronic kidney disease    COPD (chronic obstructive pulmonary disease) (HCC)    Dyspnea    with exertion   GERD (gastroesophageal reflux disease)    h/o    Hearing loss    Hyperlipidemia    MGUS (monoclonal gammopathy of unknown significance)    Multiple myeloma (HCC) 2011   and prostate ca in 2019   Proteinuria    Tobacco abuse     Social History   Socioeconomic History   Marital status: Married    Spouse name: Not on file   Number of children: 2   Years of education: Not on file   Highest education level: Not on file  Occupational History   Not on file  Tobacco Use   Smoking status: Former    Current packs/day: 0.00    Average packs/day: 2.0 packs/day for 50.0 years (100.0 ttl pk-yrs)    Types: Cigarettes    Start date: 07/1970    Quit date: 07/2020    Years since quitting: 3.3    Passive exposure: Current   Smokeless tobacco: Never  Vaping Use   Vaping status: Never Used  Substance and Sexual Activity   Alcohol use: No  Alcohol/week: 0.0 standard drinks of alcohol   Drug use: No   Sexual activity: Yes  Other Topics Concern   Not on file  Social History Narrative   Disabled, 2 sons-healthy   Social Drivers of Health   Financial Resource Strain: Low Risk  (03/19/2023)   Received from [facility]   Overall Financial Resource Strain (CARDIA)    Difficulty of Paying Living Expenses: Not hard at all  Recent Concern: Financial Resource Strain - Medium Risk (03/12/2023)   Received from [facility]   Overall Financial Resource Strain (CARDIA)    Difficulty of Paying Living Expenses: Somewhat hard  Food Insecurity: No Food Insecurity (03/19/2023)   Received from [facility]   Hunger Vital Sign    Within the past 12 months, you worried that your food would run out before you got the money to buy more.: Never true    Within the past 12 months, the food you bought just didn't last and you didn't have money to get more.: Never true  Transportation Needs: No Transportation Needs (03/19/2023)   Received from [facility] Transportation    In the  past 12 months, has lack of transportation kept you from medical appointments or from getting medications?: No    Lack of Transportation (Non-Medical): No  Physical Activity: Not on file  Stress: Not on file  Social Connections: Unknown (02/15/2023)   Social Connection and Isolation Panel    Frequency of Communication with Friends and Family: Once a week    Frequency of Social Gatherings with Friends and Family: Once a week    Attends Religious Services: Not on file    Active Member of Clubs or Organizations: Not on file    Attends Banker Meetings: Not on file    Marital Status: Married  Intimate Partner Violence: Not At Risk (02/15/2023)   Humiliation, Afraid, Rape, and Kick questionnaire    Fear of Current or Ex-Partner: No    Emotionally Abused: No    Physically Abused: No    Sexually Abused: No    Past Surgical History:  Procedure Laterality Date   A/V FISTULAGRAM Left 01/18/2019   Procedure: A/V FISTULAGRAM;  Surgeon: Jama Cordella MATSU, MD;  Location: [facility] INV[facility] CV LAB;  Service: Cardiovascular;  Laterality: Left;   A/V FISTULAGRAM Left 12/03/2021   Procedure: A/V Fistulagram;  Surgeon: Jama Cordella MATSU, MD;  Location: [facility] CV LAB;  Service: Cardiovascular;  Laterality: Left;   AV FISTULA PLACEMENT Left 10/29/2018   Procedure: ARTERIOVENOUS (AV) FISTULA CREATION ( BRACHIAL CEPHALIC);  Surgeon: Jama Cordella MATSU, MD;  Location: [facility] ORS;  Service: Vascular;  Laterality: Left;   COLONOSCOPY WITH PROPOFOL  N/A 07/04/2014   Procedure: COLONOSCOPY WITH PROPOFOL ;  Surgeon: Rogelia Copping, MD;  Location: [facility] ENDOSCOPY;  Service: Endoscopy;  Laterality: N/A;   COLONOSCOPY WITH PROPOFOL  N/A 12/10/2021   Procedure: COLONOSCOPY WITH PROPOFOL ;  Surgeon: Copping Rogelia, MD;  Location: [facility] ENDOSCOPY;  Service: Endoscopy;  Laterality: N/A;   TONSILLECTOMY     as child    Family History  Problem Relation Age of Onset   Heart disease Brother 58       MI   Colon cancer  Neg Hx    Liver disease Neg Hx    Prostate cancer Neg Hx    Kidney cancer Neg Hx    Bladder Cancer Neg Hx     Allergies  Allergen Reactions   No  Known Allergies        Latest Ref Rng & Units 02/18/2023    8:15 AM 02/16/2023    8:57 AM 02/15/2023    8:34 AM  CBC  WBC 4.0 - 10.5 K/uL 4.2  6.4  7.4   Hemoglobin 13.0 - 17.0 g/dL 9.9  89.5  89.3   Hematocrit 39.0 - 52.0 % 30.6  32.6  32.5   Platelets 150 - 400 K/uL 115  139  112       CMP     Component Value Date/Time   NA 138 02/18/2023 0304   K 5.3 (H) 02/18/2023 0304   CL 102 02/18/2023 0304   CO2 24 02/18/2023 0304   GLUCOSE 110 (H) 02/18/2023 0304   BUN 100 (H) 02/18/2023 0304   CREATININE 7.31 (H) 02/18/2023 0304   CALCIUM  8.2 (L) 02/18/2023 0304   CALCIUM  8.7 02/15/2023 0834   PROT 9.2 (H) 02/14/2023 0017   ALBUMIN 2.4 (L) 02/16/2023 0857   AST 56 (H) 02/14/2023 0017   ALT 70 (H) 02/14/2023 0017   ALKPHOS 232 (H) 02/14/2023 0017   BILITOT 0.9 02/14/2023 0017   GFRNONAA 8 (L) 02/18/2023 0304     No results found.     Assessment & Plan:   1. End stage renal disease (HCC) (Primary) Recommend:  The patient is doing well and currently has adequate dialysis access. The patient's dialysis center is not reporting any access issues. Flow pattern is stable when compared to the prior ultrasound.  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  It is stable compared to his previous study.  We discussed revision versus close monitoring and follow-up.  The patient should have a duplex ultrasound of the dialysis access in 6 months. The patient will follow-up with me in the office after each ultrasound    2. Benign essential HTN Continue antihypertensive medications as already ordered, these medications have been reviewed and there are no changes at this time.  3. Mixed hyperlipidemia Continue statin  as ordered and reviewed, no changes at this time   Current Outpatient Medications on File Prior to Visit  Medication Sig Dispense Refill   albuterol  (VENTOLIN  HFA) 108 (90 Base) MCG/ACT inhaler Inhale 2 puffs into the lungs every 4 (four) hours as needed for wheezing or shortness of breath. 8 g 0   amoxicillin  (AMOXIL ) 500 MG tablet Take 1 tablet (500 mg total) by mouth 3 (three) times daily. Prior to dental procedure, home med. (Patient not taking: Reported on 04/30/2023)     ASPIRIN  LOW DOSE 81 MG tablet Take 81 mg by mouth daily.     calcium  acetate (PHOSLO) 667 MG capsule Take 667 mg by mouth 2 (two) times daily.     cetirizine (ZYRTEC) 10 MG tablet Take 10 mg by mouth daily.     Cholecalciferol  (VITAMIN D3) 50 MCG (2000 UT) TABS Take 2,000 Units by mouth at bedtime.     fluticasone  (FLONASE) 50 MCG/ACT nasal spray Place 2 sprays into both nostrils daily as needed for allergies.     Fluticasone -Umeclidin-Vilant (TRELEGY ELLIPTA ) 100-62.5-25 MCG/ACT AEPB Inhale 1 puff into the lungs daily. 1 each 2   furosemide  (LASIX ) 20 MG tablet Take 20 mg by mouth 2 (two) times daily.     ipratropium-albuterol  (DUONEB) 0.5-2.5 (3) MG/3ML SOLN Take 3 mLs by nebulization every 6 (six) hours as needed. 120  mL 2   lidocaine -prilocaine  (EMLA ) cream Apply 1 application topically as needed (prior to access for dialysis.).     montelukast (SINGULAIR) 10 MG tablet Take 10 mg by mouth at bedtime.     multivitamin (RENA-VIT) TABS tablet Take 1 tablet by mouth daily.     oxyCODONE -acetaminophen  (PERCOCET/ROXICET) 5-325 MG tablet Take 1 tablet by mouth 4 (four) times daily as needed for moderate pain.      rOPINIRole  (REQUIP ) 0.25 MG tablet Take 0.25 mg by mouth every morning.     rosuvastatin (CRESTOR) 10 MG tablet Take 10 mg by mouth daily. (Patient not taking: Reported on 02/14/2023)     No current facility-administered medications on file prior to visit.    There are no Patient Instructions on file for this  visit. No follow-ups on file.   Nihar Klus E Leticia Coletta, NP

## 2024-04-28 ENCOUNTER — Ambulatory Visit (INDEPENDENT_AMBULATORY_CARE_PROVIDER_SITE_OTHER): Admitting: Vascular Surgery

## 2024-04-28 ENCOUNTER — Encounter (INDEPENDENT_AMBULATORY_CARE_PROVIDER_SITE_OTHER)
